# Patient Record
Sex: Male | Born: 1935 | Race: White | Hispanic: No | Smoking: Never smoker
Health system: Southern US, Community
[De-identification: ages and names within clinical notes are randomized; demographics above are authoritative.]

## PROBLEM LIST (undated history)

## (undated) DIAGNOSIS — H3530 Unspecified macular degeneration: Secondary | ICD-10-CM

## (undated) DIAGNOSIS — K649 Unspecified hemorrhoids: Secondary | ICD-10-CM

## (undated) DIAGNOSIS — G4733 Obstructive sleep apnea (adult) (pediatric): Secondary | ICD-10-CM

## (undated) DIAGNOSIS — R42 Dizziness and giddiness: Secondary | ICD-10-CM

## (undated) DIAGNOSIS — E785 Hyperlipidemia, unspecified: Secondary | ICD-10-CM

## (undated) DIAGNOSIS — K5790 Diverticulosis of intestine, part unspecified, without perforation or abscess without bleeding: Secondary | ICD-10-CM

## (undated) DIAGNOSIS — F1010 Alcohol abuse, uncomplicated: Secondary | ICD-10-CM

## (undated) DIAGNOSIS — D126 Benign neoplasm of colon, unspecified: Secondary | ICD-10-CM

## (undated) DIAGNOSIS — D649 Anemia, unspecified: Secondary | ICD-10-CM

## (undated) DIAGNOSIS — I10 Essential (primary) hypertension: Secondary | ICD-10-CM

## (undated) DIAGNOSIS — E119 Type 2 diabetes mellitus without complications: Secondary | ICD-10-CM

## (undated) DIAGNOSIS — Z9989 Dependence on other enabling machines and devices: Secondary | ICD-10-CM

## (undated) HISTORY — DX: Dizziness and giddiness: R42

## (undated) HISTORY — DX: Hyperlipidemia, unspecified: E78.5

## (undated) HISTORY — DX: Benign neoplasm of colon, unspecified: D12.6

## (undated) HISTORY — DX: Essential (primary) hypertension: I10

## (undated) HISTORY — DX: Diverticulosis of intestine, part unspecified, without perforation or abscess without bleeding: K57.90

## (undated) HISTORY — DX: Unspecified hemorrhoids: K64.9

## (undated) HISTORY — PX: ANTERIOR CERVICAL DECOMP/DISCECTOMY FUSION: SHX1161

## (undated) HISTORY — PX: CATARACT EXTRACTION: SUR2

## (undated) HISTORY — DX: Alcohol abuse, uncomplicated: F10.10

## (undated) HISTORY — PX: BACK SURGERY: SHX140

## (undated) HISTORY — DX: Unspecified macular degeneration: H35.30

## (undated) HISTORY — PX: TONSILLECTOMY: SUR1361

---
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
## 2001-04-14 ENCOUNTER — Encounter: Payer: Self-pay | Admitting: Emergency Medicine

## 2001-04-15 ENCOUNTER — Inpatient Hospital Stay (HOSPITAL_COMMUNITY): Admission: EM | Admit: 2001-04-15 | Discharge: 2001-04-17 | Payer: Self-pay | Admitting: Emergency Medicine

## 2001-04-17 ENCOUNTER — Encounter: Payer: Self-pay | Admitting: Internal Medicine

## 2003-03-10 DIAGNOSIS — D126 Benign neoplasm of colon, unspecified: Secondary | ICD-10-CM

## 2003-03-10 HISTORY — DX: Benign neoplasm of colon, unspecified: D12.6

## 2004-07-16 ENCOUNTER — Ambulatory Visit: Payer: Self-pay | Admitting: Internal Medicine

## 2004-08-12 ENCOUNTER — Ambulatory Visit: Payer: Self-pay | Admitting: Internal Medicine

## 2004-09-16 ENCOUNTER — Ambulatory Visit: Payer: Self-pay | Admitting: Internal Medicine

## 2004-10-16 ENCOUNTER — Ambulatory Visit: Payer: Self-pay | Admitting: Internal Medicine

## 2004-11-11 ENCOUNTER — Ambulatory Visit: Payer: Self-pay | Admitting: Internal Medicine

## 2004-12-18 ENCOUNTER — Ambulatory Visit: Payer: Self-pay | Admitting: Internal Medicine

## 2004-12-25 ENCOUNTER — Ambulatory Visit (HOSPITAL_COMMUNITY): Admission: RE | Admit: 2004-12-25 | Discharge: 2004-12-25 | Payer: Self-pay | Admitting: Ophthalmology

## 2005-01-14 ENCOUNTER — Ambulatory Visit: Payer: Self-pay | Admitting: Internal Medicine

## 2005-02-15 ENCOUNTER — Ambulatory Visit: Payer: Self-pay | Admitting: Internal Medicine

## 2005-03-11 ENCOUNTER — Ambulatory Visit: Payer: Self-pay | Admitting: Internal Medicine

## 2005-04-09 ENCOUNTER — Ambulatory Visit: Payer: Self-pay | Admitting: Internal Medicine

## 2005-05-11 ENCOUNTER — Ambulatory Visit: Payer: Self-pay | Admitting: Internal Medicine

## 2005-05-17 ENCOUNTER — Encounter: Admission: RE | Admit: 2005-05-17 | Discharge: 2005-05-17 | Payer: Self-pay | Admitting: Internal Medicine

## 2005-06-11 ENCOUNTER — Encounter: Admission: RE | Admit: 2005-06-11 | Discharge: 2005-06-11 | Payer: Self-pay | Admitting: Neurology

## 2005-06-15 ENCOUNTER — Ambulatory Visit: Payer: Self-pay | Admitting: Internal Medicine

## 2005-07-13 ENCOUNTER — Ambulatory Visit: Payer: Self-pay | Admitting: Internal Medicine

## 2005-07-23 ENCOUNTER — Encounter: Admission: RE | Admit: 2005-07-23 | Discharge: 2005-07-23 | Payer: Self-pay | Admitting: Internal Medicine

## 2005-08-17 ENCOUNTER — Ambulatory Visit: Payer: Self-pay | Admitting: Internal Medicine

## 2005-10-15 ENCOUNTER — Ambulatory Visit: Payer: Self-pay | Admitting: Internal Medicine

## 2005-11-15 ENCOUNTER — Ambulatory Visit: Payer: Self-pay | Admitting: Internal Medicine

## 2006-01-10 ENCOUNTER — Ambulatory Visit: Payer: Self-pay | Admitting: Internal Medicine

## 2006-01-17 ENCOUNTER — Ambulatory Visit: Payer: Self-pay | Admitting: Internal Medicine

## 2006-02-07 ENCOUNTER — Ambulatory Visit: Payer: Self-pay | Admitting: Internal Medicine

## 2006-03-01 ENCOUNTER — Ambulatory Visit: Payer: Self-pay | Admitting: Internal Medicine

## 2006-03-29 ENCOUNTER — Ambulatory Visit: Payer: Self-pay | Admitting: Internal Medicine

## 2006-04-27 ENCOUNTER — Ambulatory Visit: Payer: Self-pay | Admitting: Internal Medicine

## 2006-05-09 ENCOUNTER — Ambulatory Visit: Payer: Self-pay | Admitting: Gastroenterology

## 2006-05-30 ENCOUNTER — Encounter (INDEPENDENT_AMBULATORY_CARE_PROVIDER_SITE_OTHER): Payer: Self-pay | Admitting: *Deleted

## 2006-05-30 ENCOUNTER — Encounter: Payer: Self-pay | Admitting: Internal Medicine

## 2006-05-30 ENCOUNTER — Ambulatory Visit: Payer: Self-pay | Admitting: Gastroenterology

## 2006-06-01 ENCOUNTER — Ambulatory Visit: Payer: Self-pay | Admitting: Internal Medicine

## 2006-06-01 LAB — CONVERTED CEMR LAB: Hgb A1c MFr Bld: 6.6 % — ABNORMAL HIGH (ref 4.6–6.0)

## 2006-07-06 ENCOUNTER — Ambulatory Visit: Payer: Self-pay | Admitting: Internal Medicine

## 2006-09-07 ENCOUNTER — Ambulatory Visit: Payer: Self-pay | Admitting: Internal Medicine

## 2006-10-03 ENCOUNTER — Ambulatory Visit: Payer: Self-pay | Admitting: Internal Medicine

## 2006-10-03 LAB — CONVERTED CEMR LAB
Hgb A1c MFr Bld: 7.3 % — ABNORMAL HIGH (ref 4.6–6.0)
PSA: 0.07 ng/mL — ABNORMAL LOW (ref 0.10–4.00)

## 2006-11-01 ENCOUNTER — Ambulatory Visit: Payer: Self-pay | Admitting: Internal Medicine

## 2006-12-06 ENCOUNTER — Ambulatory Visit: Payer: Self-pay | Admitting: Internal Medicine

## 2006-12-19 ENCOUNTER — Ambulatory Visit: Payer: Self-pay | Admitting: Internal Medicine

## 2006-12-19 LAB — CONVERTED CEMR LAB
Folate: >20 ng/mL
Vitamin B-12: 684 pg/mL (ref 211–911)

## 2007-01-06 ENCOUNTER — Ambulatory Visit: Payer: Self-pay | Admitting: Internal Medicine

## 2007-02-01 DIAGNOSIS — I10 Essential (primary) hypertension: Secondary | ICD-10-CM | POA: Insufficient documentation

## 2007-02-24 ENCOUNTER — Ambulatory Visit: Payer: Self-pay | Admitting: Internal Medicine

## 2007-03-22 DIAGNOSIS — R42 Dizziness and giddiness: Secondary | ICD-10-CM | POA: Insufficient documentation

## 2007-03-23 ENCOUNTER — Ambulatory Visit: Payer: Self-pay | Admitting: Internal Medicine

## 2007-03-23 DIAGNOSIS — M48061 Spinal stenosis, lumbar region without neurogenic claudication: Secondary | ICD-10-CM | POA: Insufficient documentation

## 2007-03-23 DIAGNOSIS — M5000 Cervical disc disorder with myelopathy, unspecified cervical region: Secondary | ICD-10-CM | POA: Insufficient documentation

## 2007-03-23 DIAGNOSIS — E118 Type 2 diabetes mellitus with unspecified complications: Secondary | ICD-10-CM | POA: Insufficient documentation

## 2007-03-23 DIAGNOSIS — E538 Deficiency of other specified B group vitamins: Secondary | ICD-10-CM | POA: Insufficient documentation

## 2007-03-23 DIAGNOSIS — K5900 Constipation, unspecified: Secondary | ICD-10-CM | POA: Insufficient documentation

## 2007-03-31 ENCOUNTER — Telehealth: Payer: Self-pay | Admitting: *Deleted

## 2007-04-24 ENCOUNTER — Ambulatory Visit: Payer: Self-pay | Admitting: Internal Medicine

## 2007-04-24 DIAGNOSIS — I10 Essential (primary) hypertension: Secondary | ICD-10-CM | POA: Insufficient documentation

## 2007-05-26 ENCOUNTER — Ambulatory Visit: Payer: Self-pay | Admitting: Internal Medicine

## 2007-06-26 ENCOUNTER — Ambulatory Visit: Payer: Self-pay | Admitting: Internal Medicine

## 2007-07-19 ENCOUNTER — Ambulatory Visit: Payer: Self-pay | Admitting: Internal Medicine

## 2007-07-19 LAB — CONVERTED CEMR LAB: Hgb A1c MFr Bld: 6.9 % — ABNORMAL HIGH (ref 4.6–6.0)

## 2007-07-26 ENCOUNTER — Ambulatory Visit: Payer: Self-pay | Admitting: Internal Medicine

## 2007-08-31 ENCOUNTER — Ambulatory Visit: Payer: Self-pay | Admitting: Internal Medicine

## 2007-08-31 DIAGNOSIS — K140 Glossitis: Secondary | ICD-10-CM | POA: Insufficient documentation

## 2007-08-31 DIAGNOSIS — N400 Enlarged prostate without lower urinary tract symptoms: Secondary | ICD-10-CM | POA: Insufficient documentation

## 2007-10-02 ENCOUNTER — Ambulatory Visit: Payer: Self-pay | Admitting: Internal Medicine

## 2007-10-02 DIAGNOSIS — R609 Edema, unspecified: Secondary | ICD-10-CM | POA: Insufficient documentation

## 2007-10-02 DIAGNOSIS — R350 Frequency of micturition: Secondary | ICD-10-CM | POA: Insufficient documentation

## 2007-11-02 ENCOUNTER — Ambulatory Visit: Payer: Self-pay | Admitting: Internal Medicine

## 2007-12-07 ENCOUNTER — Ambulatory Visit: Payer: Self-pay | Admitting: Internal Medicine

## 2007-12-07 LAB — CONVERTED CEMR LAB: Hgb A1c MFr Bld: 6.8 % — ABNORMAL HIGH (ref 4.6–6.0)

## 2007-12-18 ENCOUNTER — Encounter: Admission: RE | Admit: 2007-12-18 | Discharge: 2008-03-17 | Payer: Self-pay | Admitting: Internal Medicine

## 2008-01-11 ENCOUNTER — Ambulatory Visit: Payer: Self-pay | Admitting: Internal Medicine

## 2008-02-23 ENCOUNTER — Ambulatory Visit: Payer: Self-pay | Admitting: Internal Medicine

## 2008-03-18 ENCOUNTER — Emergency Department (HOSPITAL_COMMUNITY): Admission: EM | Admit: 2008-03-18 | Discharge: 2008-03-18 | Payer: Self-pay | Admitting: Emergency Medicine

## 2008-03-25 ENCOUNTER — Ambulatory Visit: Payer: Self-pay | Admitting: Internal Medicine

## 2008-03-25 LAB — CONVERTED CEMR LAB
ALT: 23 units/L (ref 0–53)
AST: 17 units/L (ref 0–37)
Albumin: 3.4 g/dL — ABNORMAL LOW (ref 3.5–5.2)
Alkaline Phosphatase: 44 units/L (ref 39–117)
BUN: 12 mg/dL (ref 6–23)
Basophils Absolute: 0 10*3/uL (ref 0.0–0.1)
Basophils Relative: 0.5 % (ref 0.0–3.0)
Bilirubin, Direct: 0.1 mg/dL (ref 0.0–0.3)
CO2: 33 meq/L — ABNORMAL HIGH (ref 19–32)
Calcium: 9.3 mg/dL (ref 8.4–10.5)
Chloride: 107 meq/L (ref 96–112)
Cholesterol: 158 mg/dL (ref 0–200)
Creatinine, Ser: 0.9 mg/dL (ref 0.4–1.5)
Creatinine,U: 92.5 mg/dL
Eosinophils Absolute: 0.2 10*3/uL (ref 0.0–0.7)
Eosinophils Relative: 2.7 % (ref 0.0–5.0)
GFR calc Af Amer: 104 mL/min
GFR calc non Af Amer: 86 mL/min
Glucose, Bld: 167 mg/dL — ABNORMAL HIGH (ref 70–99)
HCT: 36.9 % — ABNORMAL LOW (ref 39.0–52.0)
HDL: 34 mg/dL — ABNORMAL LOW (ref >39.0)
Hemoglobin: 12.4 g/dL — ABNORMAL LOW (ref 13.0–17.0)
Hgb A1c MFr Bld: 6.6 % — ABNORMAL HIGH (ref 4.6–6.0)
LDL Cholesterol: 92 mg/dL (ref 0–99)
Lymphocytes Relative: 32.4 % (ref 12.0–46.0)
MCHC: 33.4 g/dL (ref 30.0–36.0)
MCV: 97.4 fL (ref 78.0–100.0)
Microalb Creat Ratio: 25.9 mg/g (ref 0.0–30.0)
Microalb, Ur: 2.4 mg/dL — ABNORMAL HIGH (ref 0.0–1.9)
Monocytes Absolute: 1.4 10*3/uL — ABNORMAL HIGH (ref 0.1–1.0)
Monocytes Relative: 21.6 % — ABNORMAL HIGH (ref 3.0–12.0)
Neutro Abs: 2.7 10*3/uL (ref 1.4–7.7)
Neutrophils Relative %: 42.8 % — ABNORMAL LOW (ref 43.0–77.0)
PSA: 0.08 ng/mL — ABNORMAL LOW (ref 0.10–4.00)
Platelets: 187 10*3/uL (ref 150–400)
Potassium: 5.2 meq/L — ABNORMAL HIGH (ref 3.5–5.1)
RBC: 3.79 M/uL — ABNORMAL LOW (ref 4.22–5.81)
RDW: 13.2 % (ref 11.5–14.6)
Sodium: 141 meq/L (ref 135–145)
TSH: 3.2 microintl units/mL (ref 0.35–5.50)
Total Bilirubin: 0.9 mg/dL (ref 0.3–1.2)
Total CHOL/HDL Ratio: 4.6
Total Protein: 6.6 g/dL (ref 6.0–8.3)
Triglycerides: 161 mg/dL — ABNORMAL HIGH (ref 0–149)
VLDL: 32 mg/dL (ref 0–40)
WBC: 6.3 10*3/uL (ref 4.5–10.5)

## 2008-04-01 ENCOUNTER — Ambulatory Visit: Payer: Self-pay | Admitting: Internal Medicine

## 2008-04-01 DIAGNOSIS — R635 Abnormal weight gain: Secondary | ICD-10-CM | POA: Insufficient documentation

## 2008-04-09 ENCOUNTER — Encounter: Admission: RE | Admit: 2008-04-09 | Discharge: 2008-05-02 | Payer: Self-pay | Admitting: Internal Medicine

## 2008-05-23 ENCOUNTER — Ambulatory Visit: Payer: Self-pay | Admitting: Internal Medicine

## 2008-06-24 ENCOUNTER — Ambulatory Visit: Payer: Self-pay | Admitting: Internal Medicine

## 2008-07-09 ENCOUNTER — Ambulatory Visit: Payer: Self-pay | Admitting: Internal Medicine

## 2008-07-09 LAB — CONVERTED CEMR LAB
ALT: 17 units/L (ref 0–53)
AST: 18 units/L (ref 0–37)
Albumin: 3.4 g/dL — ABNORMAL LOW (ref 3.5–5.2)
Alkaline Phosphatase: 39 units/L (ref 39–117)
BUN: 11 mg/dL (ref 6–23)
Bilirubin, Direct: 0.1 mg/dL (ref 0.0–0.3)
CO2: 31 meq/L (ref 19–32)
Calcium: 9.2 mg/dL (ref 8.4–10.5)
Chloride: 101 meq/L (ref 96–112)
Cholesterol: 159 mg/dL (ref 0–200)
Creatinine, Ser: 0.7 mg/dL (ref 0.4–1.5)
GFR calc Af Amer: 139 mL/min
GFR calc non Af Amer: 115 mL/min
Glucose, Bld: 153 mg/dL — ABNORMAL HIGH (ref 70–99)
HDL: 35.6 mg/dL — ABNORMAL LOW (ref >39.0)
LDL Cholesterol: 91 mg/dL (ref 0–99)
Potassium: 4.8 meq/L (ref 3.5–5.1)
Sodium: 138 meq/L (ref 135–145)
Total Bilirubin: 1 mg/dL (ref 0.3–1.2)
Total CHOL/HDL Ratio: 4.5
Total Protein: 6.6 g/dL (ref 6.0–8.3)
Triglycerides: 160 mg/dL — ABNORMAL HIGH (ref 0–149)
VLDL: 32 mg/dL (ref 0–40)

## 2008-07-23 ENCOUNTER — Ambulatory Visit: Payer: Self-pay | Admitting: Internal Medicine

## 2008-08-23 ENCOUNTER — Ambulatory Visit: Payer: Self-pay | Admitting: Internal Medicine

## 2008-09-23 ENCOUNTER — Ambulatory Visit: Payer: Self-pay | Admitting: Internal Medicine

## 2008-10-15 ENCOUNTER — Ambulatory Visit: Payer: Self-pay | Admitting: Internal Medicine

## 2008-10-15 LAB — CONVERTED CEMR LAB
ALT: 22 units/L (ref 0–53)
AST: 16 units/L (ref 0–37)
Albumin: 3.5 g/dL (ref 3.5–5.2)
Alkaline Phosphatase: 42 units/L (ref 39–117)
BUN: 13 mg/dL (ref 6–23)
Bilirubin, Direct: 0.1 mg/dL (ref 0.0–0.3)
CO2: 30 meq/L (ref 19–32)
Calcium: 9.5 mg/dL (ref 8.4–10.5)
Chloride: 99 meq/L (ref 96–112)
Creatinine, Ser: 0.8 mg/dL (ref 0.4–1.5)
GFR calc Af Amer: 119 mL/min
GFR calc non Af Amer: 99 mL/min
Glucose, Bld: 131 mg/dL — ABNORMAL HIGH (ref 70–99)
Hgb A1c MFr Bld: 7.2 % — ABNORMAL HIGH (ref 4.6–6.0)
Potassium: 4.4 meq/L (ref 3.5–5.1)
Sodium: 138 meq/L (ref 135–145)
Total Bilirubin: 1 mg/dL (ref 0.3–1.2)
Total Protein: 6.7 g/dL (ref 6.0–8.3)

## 2008-10-22 ENCOUNTER — Ambulatory Visit: Payer: Self-pay | Admitting: Internal Medicine

## 2009-01-14 ENCOUNTER — Ambulatory Visit: Payer: Self-pay | Admitting: Internal Medicine

## 2009-01-14 LAB — CONVERTED CEMR LAB
ALT: 20 units/L (ref 0–53)
AST: 17 units/L (ref 0–37)
Albumin: 3.4 g/dL — ABNORMAL LOW (ref 3.5–5.2)
Alkaline Phosphatase: 39 units/L (ref 39–117)
Bilirubin, Direct: 0.1 mg/dL (ref 0.0–0.3)
Creatinine,U: 39 mg/dL
Hgb A1c MFr Bld: 7.1 % — ABNORMAL HIGH (ref 4.6–6.5)
Microalb Creat Ratio: 10.3 mg/g (ref 0.0–30.0)
Microalb, Ur: 0.4 mg/dL (ref 0.0–1.9)
Total Bilirubin: 0.8 mg/dL (ref 0.3–1.2)
Total Protein: 6.6 g/dL (ref 6.0–8.3)

## 2009-01-21 ENCOUNTER — Ambulatory Visit: Payer: Self-pay | Admitting: Internal Medicine

## 2009-01-21 DIAGNOSIS — L42 Pityriasis rosea: Secondary | ICD-10-CM | POA: Insufficient documentation

## 2009-04-16 ENCOUNTER — Ambulatory Visit: Payer: Self-pay | Admitting: Internal Medicine

## 2009-04-16 LAB — CONVERTED CEMR LAB
ALT: 21 units/L (ref 0–53)
AST: 18 units/L (ref 0–37)
Albumin: 3.6 g/dL (ref 3.5–5.2)
Alkaline Phosphatase: 43 units/L (ref 39–117)
BUN: 15 mg/dL (ref 6–23)
Bilirubin, Direct: 0 mg/dL (ref 0.0–0.3)
CO2: 30 meq/L (ref 19–32)
Calcium: 9.1 mg/dL (ref 8.4–10.5)
Chloride: 104 meq/L (ref 96–112)
Cholesterol: 136 mg/dL (ref 0–200)
Creatinine, Ser: 0.8 mg/dL (ref 0.4–1.5)
GFR calc non Af Amer: 98.41 mL/min (ref >60)
Glucose, Bld: 162 mg/dL — ABNORMAL HIGH (ref 70–99)
HDL: 30.2 mg/dL — ABNORMAL LOW (ref >39.00)
Hgb A1c MFr Bld: 6.9 % — ABNORMAL HIGH (ref 4.6–6.5)
LDL Cholesterol: 79 mg/dL (ref 0–99)
Potassium: 4.8 meq/L (ref 3.5–5.1)
Sodium: 141 meq/L (ref 135–145)
Total Bilirubin: 1.2 mg/dL (ref 0.3–1.2)
Total CHOL/HDL Ratio: 5
Total Protein: 6.9 g/dL (ref 6.0–8.3)
Triglycerides: 135 mg/dL (ref 0.0–149.0)
VLDL: 27 mg/dL (ref 0.0–40.0)

## 2009-04-23 ENCOUNTER — Ambulatory Visit: Payer: Self-pay | Admitting: Internal Medicine

## 2009-05-26 ENCOUNTER — Ambulatory Visit: Payer: Self-pay | Admitting: Internal Medicine

## 2009-07-23 ENCOUNTER — Ambulatory Visit: Payer: Self-pay | Admitting: Internal Medicine

## 2009-07-23 DIAGNOSIS — N529 Male erectile dysfunction, unspecified: Secondary | ICD-10-CM | POA: Insufficient documentation

## 2009-07-23 LAB — CONVERTED CEMR LAB
ALT: 21 units/L (ref 0–53)
AST: 16 units/L (ref 0–37)
Albumin: 3.6 g/dL (ref 3.5–5.2)
Alkaline Phosphatase: 42 units/L (ref 39–117)
BUN: 12 mg/dL (ref 6–23)
Bilirubin, Direct: 0 mg/dL (ref 0.0–0.3)
CO2: 31 meq/L (ref 19–32)
Calcium: 9.4 mg/dL (ref 8.4–10.5)
Chloride: 102 meq/L (ref 96–112)
Cholesterol: 163 mg/dL (ref 0–200)
Creatinine, Ser: 0.7 mg/dL (ref 0.4–1.5)
Direct LDL: 95.2 mg/dL
Folate: >20 ng/mL
GFR calc non Af Amer: 114.72 mL/min (ref >60)
Glucose, Bld: 122 mg/dL — ABNORMAL HIGH (ref 70–99)
HDL: 31.5 mg/dL — ABNORMAL LOW (ref >39.00)
Hgb A1c MFr Bld: 7.2 % — ABNORMAL HIGH (ref 4.6–6.5)
Potassium: 4.7 meq/L (ref 3.5–5.1)
Sodium: 140 meq/L (ref 135–145)
Total Bilirubin: 0.9 mg/dL (ref 0.3–1.2)
Total Protein: 7.3 g/dL (ref 6.0–8.3)
Vitamin B-12: >1500 pg/mL — ABNORMAL HIGH (ref 211–911)

## 2009-09-25 ENCOUNTER — Ambulatory Visit: Payer: Self-pay | Admitting: Internal Medicine

## 2009-10-21 ENCOUNTER — Ambulatory Visit: Payer: Self-pay | Admitting: Internal Medicine

## 2009-10-21 DIAGNOSIS — Z9181 History of falling: Secondary | ICD-10-CM | POA: Insufficient documentation

## 2009-10-28 ENCOUNTER — Encounter: Payer: Self-pay | Admitting: Internal Medicine

## 2009-11-20 ENCOUNTER — Ambulatory Visit: Payer: Self-pay | Admitting: Internal Medicine

## 2009-12-24 ENCOUNTER — Ambulatory Visit: Payer: Self-pay | Admitting: Internal Medicine

## 2010-01-06 ENCOUNTER — Telehealth: Payer: Self-pay | Admitting: Internal Medicine

## 2010-01-20 ENCOUNTER — Ambulatory Visit: Payer: Self-pay | Admitting: Internal Medicine

## 2010-01-20 DIAGNOSIS — K120 Recurrent oral aphthae: Secondary | ICD-10-CM | POA: Insufficient documentation

## 2010-01-20 LAB — CONVERTED CEMR LAB
BUN: 19 mg/dL (ref 6–23)
CO2: 33 meq/L — ABNORMAL HIGH (ref 19–32)
Calcium: 9.5 mg/dL (ref 8.4–10.5)
Chloride: 100 meq/L (ref 96–112)
Creatinine, Ser: 0.8 mg/dL (ref 0.4–1.5)
GFR calc non Af Amer: 98.22 mL/min (ref >60)
Glucose, Bld: 130 mg/dL — ABNORMAL HIGH (ref 70–99)
Hgb A1c MFr Bld: 7.2 % — ABNORMAL HIGH (ref 4.6–6.5)
Potassium: 5.1 meq/L (ref 3.5–5.1)
Sodium: 141 meq/L (ref 135–145)

## 2010-02-27 ENCOUNTER — Ambulatory Visit: Payer: Self-pay | Admitting: Cardiovascular Disease

## 2010-02-27 ENCOUNTER — Ambulatory Visit: Payer: Self-pay | Admitting: Internal Medicine

## 2010-02-27 ENCOUNTER — Inpatient Hospital Stay (HOSPITAL_COMMUNITY): Admission: EM | Admit: 2010-02-27 | Discharge: 2010-03-03 | Payer: Self-pay | Admitting: Cardiovascular Disease

## 2010-02-27 DIAGNOSIS — I4892 Unspecified atrial flutter: Secondary | ICD-10-CM | POA: Insufficient documentation

## 2010-02-28 ENCOUNTER — Encounter: Payer: Self-pay | Admitting: Cardiovascular Disease

## 2010-02-28 ENCOUNTER — Encounter: Payer: Self-pay | Admitting: Internal Medicine

## 2010-03-02 ENCOUNTER — Encounter: Payer: Self-pay | Admitting: Internal Medicine

## 2010-03-30 ENCOUNTER — Telehealth (INDEPENDENT_AMBULATORY_CARE_PROVIDER_SITE_OTHER): Payer: Self-pay | Admitting: *Deleted

## 2010-03-31 ENCOUNTER — Ambulatory Visit: Payer: Self-pay | Admitting: Cardiovascular Disease

## 2010-03-31 ENCOUNTER — Encounter (HOSPITAL_COMMUNITY): Admission: RE | Admit: 2010-03-31 | Discharge: 2010-05-06 | Payer: Self-pay | Admitting: Internal Medicine

## 2010-03-31 ENCOUNTER — Ambulatory Visit: Payer: Self-pay | Admitting: Internal Medicine

## 2010-03-31 ENCOUNTER — Encounter: Payer: Self-pay | Admitting: Cardiovascular Disease

## 2010-03-31 ENCOUNTER — Ambulatory Visit: Payer: Self-pay

## 2010-04-03 ENCOUNTER — Telehealth: Payer: Self-pay | Admitting: *Deleted

## 2010-04-08 ENCOUNTER — Ambulatory Visit: Payer: Self-pay | Admitting: Internal Medicine

## 2010-05-06 ENCOUNTER — Telehealth: Payer: Self-pay | Admitting: Internal Medicine

## 2010-06-10 ENCOUNTER — Ambulatory Visit: Payer: Self-pay | Admitting: Internal Medicine

## 2010-06-10 LAB — CONVERTED CEMR LAB
ALT: 15 units/L (ref 0–53)
AST: 14 units/L (ref 0–37)
Albumin: 3.5 g/dL (ref 3.5–5.2)
Alkaline Phosphatase: 45 units/L (ref 39–117)
Bilirubin, Direct: 0.1 mg/dL (ref 0.0–0.3)
Cholesterol: 148 mg/dL (ref 0–200)
HDL: 35.2 mg/dL — ABNORMAL LOW (ref >39.00)
Hgb A1c MFr Bld: 7.2 % — ABNORMAL HIGH (ref 4.6–6.5)
LDL Cholesterol: 77 mg/dL (ref 0–99)
Total Bilirubin: 0.8 mg/dL (ref 0.3–1.2)
Total CHOL/HDL Ratio: 4
Total Protein: 6.5 g/dL (ref 6.0–8.3)
Triglycerides: 181 mg/dL — ABNORMAL HIGH (ref 0.0–149.0)
VLDL: 36.2 mg/dL (ref 0.0–40.0)

## 2010-06-17 ENCOUNTER — Ambulatory Visit: Payer: Self-pay | Admitting: Internal Medicine

## 2010-08-29 ENCOUNTER — Encounter: Payer: Self-pay | Admitting: Internal Medicine

## 2010-09-06 LAB — CONVERTED CEMR LAB
ALT: 24 units/L (ref 0–53)
AST: 17 units/L (ref 0–37)
Albumin: 3.6 g/dL (ref 3.5–5.2)
Alkaline Phosphatase: 44 units/L (ref 39–117)
BUN: 13 mg/dL (ref 6–23)
Basophils Absolute: 0 10*3/uL (ref 0.0–0.1)
Basophils Relative: 0.7 % (ref 0.0–1.0)
Bilirubin, Direct: 0.1 mg/dL (ref 0.0–0.3)
CO2: 33 meq/L — ABNORMAL HIGH (ref 19–32)
Calcium: 9.1 mg/dL (ref 8.4–10.5)
Chloride: 101 meq/L (ref 96–112)
Cholesterol: 155 mg/dL (ref 0–200)
Creatinine, Ser: 0.8 mg/dL (ref 0.4–1.5)
Eosinophils Absolute: 0.2 10*3/uL (ref 0.0–0.6)
Eosinophils Relative: 2.7 % (ref 0.0–5.0)
Folate: >20 ng/mL
GFR calc Af Amer: 120 mL/min
GFR calc non Af Amer: 99 mL/min
Glucose, Bld: 150 mg/dL — ABNORMAL HIGH (ref 70–99)
HCT: 38.7 % — ABNORMAL LOW (ref 39.0–52.0)
HDL: 35.3 mg/dL — ABNORMAL LOW (ref >39.0)
Hemoglobin: 13.4 g/dL (ref 13.0–17.0)
Hgb A1c MFr Bld: 6.6 % — ABNORMAL HIGH (ref 4.6–6.0)
LDL Cholesterol: 90 mg/dL (ref 0–99)
Lymphocytes Relative: 37.9 % (ref 12.0–46.0)
MCHC: 34.5 g/dL (ref 30.0–36.0)
MCV: 95 fL (ref 78.0–100.0)
Monocytes Absolute: 1.1 10*3/uL — ABNORMAL HIGH (ref 0.2–0.7)
Monocytes Relative: 17.3 % — ABNORMAL HIGH (ref 3.0–11.0)
Neutro Abs: 2.5 10*3/uL (ref 1.4–7.7)
Neutrophils Relative %: 41.4 % — ABNORMAL LOW (ref 43.0–77.0)
PSA: 0.09 ng/mL — ABNORMAL LOW (ref 0.10–4.00)
Platelets: 193 10*3/uL (ref 150–400)
Potassium: 4.3 meq/L (ref 3.5–5.1)
RBC: 4.08 M/uL — ABNORMAL LOW (ref 4.22–5.81)
RDW: 13.5 % (ref 11.5–14.6)
Sodium: 139 meq/L (ref 135–145)
TSH: 3.24 microintl units/mL (ref 0.35–5.50)
Total Bilirubin: 0.8 mg/dL (ref 0.3–1.2)
Total CHOL/HDL Ratio: 4.4
Total Protein: 6.5 g/dL (ref 6.0–8.3)
Triglycerides: 150 mg/dL — ABNORMAL HIGH (ref 0–149)
VLDL: 30 mg/dL (ref 0–40)
Vitamin B-12: >1500 pg/mL — ABNORMAL HIGH (ref 211–911)
WBC: 6.1 10*3/uL (ref 4.5–10.5)

## 2010-09-10 NOTE — Assessment & Plan Note (Signed)
Summary: 2 mo rov/mm   Vital Signs:  Patient profile:   77 year old male Height:      68 inches Weight:      220 pounds BMI:     33.57 Temp:     98.2 degrees F oral Pulse rate:   129 / minute Resp:     14 per minute BP sitting:   116 / 74  (left arm)  Vitals Entered By: Willy Eddy, LPN (February 27, 2010 11:59 AM) CC: roa-never started folbic stating pharmacy didnt have   CC:  roa-never started folbic stating pharmacy didnt have.  History of Present Illness: The pt  presents fr routine OV for DM and HTN and was noted to have a rapid pulse He denies chest pain, palpitatons or shortness of breat he is pale in apearance and skin is cool and damp He denies dizzyness  biut of note is that he has presented in the past for periodic dizzyness and at OV has not had any noted fast or irregular heart rates he has DM  ( fair control) and HTN well controled Due to gate problems he has a fall risk  Preventive Screening-Counseling & Management  Alcohol-Tobacco     Smoking Status: never     Passive Smoke Exposure: no  Problems Prior to Update: 1)  Aphthous Ulcers  (ICD-528.2) 2)  Risk of Falling  (ICD-V15.88) 3)  Erectile Dysfunction, Organic  (ICD-607.84) 4)  Pityriasis Rosea  (ICD-696.3) 5)  Preventive Health Care  (ICD-V70.0) 6)  Weight Gain  (ICD-783.1) 7)  Cellulitis and Abscess of Upper Arm and Forearm  (ICD-682.3) 8)  Edema  (ICD-782.3) 9)  Urinary Frequency, Chronic  (ICD-788.41) 10)  Ben Loc Hyperplasia Pros w/o Ur Obst & Oth Luts  (ICD-600.20) 11)  Glossitis  (ICD-529.0) 12)  Hyperlipidemia  (ICD-272.4) 13)  Constipation, Slow Transit  (ICD-564.01) 14)  Vitamin B12 Deficiency  (ICD-266.2) 15)  Stenosis, Lumbar Spine  (ICD-724.02) 16)  Preventive Health Care  (ICD-V70.0) 17)  Disorder, Cervical Disc W/myelopathy  (ICD-722.71) 18)  Dizziness or Vertigo  (ICD-780.4) 19)  Dm W/neuro Manifestations, Type II  (ICD-250.60) 20)  Hypertension  (ICD-401.9)  Current  Problems (verified): 1)  Aphthous Ulcers  (ICD-528.2) 2)  Risk of Falling  (ICD-V15.88) 3)  Erectile Dysfunction, Organic  (ICD-607.84) 4)  Pityriasis Rosea  (ICD-696.3) 5)  Preventive Health Care  (ICD-V70.0) 6)  Weight Gain  (ICD-783.1) 7)  Cellulitis and Abscess of Upper Arm and Forearm  (ICD-682.3) 8)  Edema  (ICD-782.3) 9)  Urinary Frequency, Chronic  (ICD-788.41) 10)  Ben Loc Hyperplasia Pros w/o Ur Obst & Oth Luts  (ICD-600.20) 11)  Glossitis  (ICD-529.0) 12)  Hyperlipidemia  (ICD-272.4) 13)  Constipation, Slow Transit  (ICD-564.01) 14)  Vitamin B12 Deficiency  (ICD-266.2) 15)  Stenosis, Lumbar Spine  (ICD-724.02) 16)  Preventive Health Care  (ICD-V70.0) 17)  Disorder, Cervical Disc W/myelopathy  (ICD-722.71) 18)  Dizziness or Vertigo  (ICD-780.4) 19)  Dm W/neuro Manifestations, Type II  (ICD-250.60) 20)  Hypertension  (ICD-401.9)  Medications Prior to Update: 1)  Amitriptyline Hcl 75 Mg  Tabs (Amitriptyline Hcl) .... At Bedtime 2)  Micardis 80 Mg Tabs (Telmisartan) .... One By Mouth Daily 3)  Cosopt   Soln (Dorzolamide-Timolol Soln) .... Two Times A Day 4)  Glipizide Xl 5 Mg Xr24h-Tab (Glipizide) .Marland Kitchen.. 1 Once Daily 5)  Metformin Hcl 500 Mg  Tabs (Metformin Hcl) .... Two Times A Day 6)  Enablex 15 Mg  Tb24 (Darifenacin Hydrobromide) .Marland KitchenMarland KitchenMarland Kitchen  Take 1 Tablet By Mouth Once A Day 7)  Ra Chromium Picolinate 400 Mcg Tabs (Chromium Picolinate) .Marland Kitchen.. 1 Once Daily 8)  Bayer Aspirin 325 Mg  Tabs (Aspirin) .... 1/2 Once Daily 9)  K-99 595 Mg  Caps (Potassium Gluconate) .... Once Daily 10)  Multivitamins   Tabs (Multiple Vitamin) .... Once Daily 11)  Flonase 50 Mcg/act  Susp (Fluticasone Propionate) .... As Directed 12)  Ascensia Contour Test   Strp (Glucose Blood) .... Once Daily 13)  Omega-3 1000 Mg  Caps (Omega-3 Fatty Acids) .... 6 Per Day 14)  Magnebind 400 200-1-400 Mg  Tabs (Magnesium-Calcium-Folic Acid) .... Once Daily 15)  Calcium 600/vitamin D 600-400 Mg-Unit Tabs (Calcium  Carbonate-Vitamin D) .Marland Kitchen.. 1 Once Daily 16)  Eye-Vites   Tabs (Multiple Vitamins-Minerals) .Marland Kitchen.. 1 Two Times A Day 17)  Cyanocobalamin 1000 Mcg/ml Inj Soln (Cyanocobalamin) .Marland Kitchen.. 1 Ml Per Month 18)  Docusate Sodium 100 Mg  Caps (Docusate Sodium) .... One Three Times A Day 19)  Flomax 0.4 Mg Xr24h-Cap (Tamsulosin Hcl) .Marland Kitchen.. 1 Once Daily 20)  Vitamin C 100 Mg Tabs (Ascorbic Acid) .Marland Kitchen.. 1 Once Daily 21)  Vitamin E 400 Unit Caps (Vitamin E) .Marland Kitchen.. 1 Once Daily 22)  Melatonin 5 Mg Caps (Melatonin) .... 2 At Bedtime 23)  Folbic 2.5-25-2 Mg Tabs (Fa-Pyridoxine-Cyancobalamin) .... One By Mouth Daily 24)  First-Dukes Mouthwash  Susp (Diphenhyd-Hydrocort-Nystatin) .Marland Kitchen.. 10 Cc Gargle and Spit Three Times A Day  Current Medications (verified): 1)  Amitriptyline Hcl 75 Mg  Tabs (Amitriptyline Hcl) .... At Bedtime 2)  Micardis 80 Mg Tabs (Telmisartan) .... One By Mouth Daily 3)  Cosopt   Soln (Dorzolamide-Timolol Soln) .... Two Times A Day 4)  Glipizide Xl 5 Mg Xr24h-Tab (Glipizide) .Marland Kitchen.. 1 Once Daily 5)  Metformin Hcl 500 Mg  Tabs (Metformin Hcl) .... Two Times A Day 6)  Enablex 15 Mg  Tb24 (Darifenacin Hydrobromide) .... Take 1 Tablet By Mouth Once A Day 7)  Ra Chromium Picolinate 400 Mcg Tabs (Chromium Picolinate) .Marland Kitchen.. 1 Once Daily 8)  Bayer Aspirin 325 Mg  Tabs (Aspirin) .... 1/2 Once Daily 9)  K-99 595 Mg  Caps (Potassium Gluconate) .... Once Daily 10)  Multivitamins   Tabs (Multiple Vitamin) .... Once Daily 11)  Flonase 50 Mcg/act  Susp (Fluticasone Propionate) .... As Directed 12)  Ascensia Contour Test   Strp (Glucose Blood) .... Once Daily 13)  Omega-3 1000 Mg  Caps (Omega-3 Fatty Acids) .... 6 Per Day 14)  Magnebind 400 200-1-400 Mg  Tabs (Magnesium-Calcium-Folic Acid) .... Once Daily 15)  Calcium 600/vitamin D 600-400 Mg-Unit Tabs (Calcium Carbonate-Vitamin D) .Marland Kitchen.. 1 Once Daily 16)  Eye-Vites   Tabs (Multiple Vitamins-Minerals) .Marland Kitchen.. 1 Two Times A Day 17)  Cyanocobalamin 1000 Mcg/ml Inj Soln  (Cyanocobalamin) .Marland Kitchen.. 1 Ml Per Month 18)  Docusate Sodium 100 Mg  Caps (Docusate Sodium) .... One Three Times A Day 19)  Flomax 0.4 Mg Xr24h-Cap (Tamsulosin Hcl) .Marland Kitchen.. 1 Once Daily 20)  Vitamin C 100 Mg Tabs (Ascorbic Acid) .Marland Kitchen.. 1 Once Daily 21)  Vitamin E 400 Unit Caps (Vitamin E) .Marland Kitchen.. 1 Once Daily 22)  Melatonin 5 Mg Caps (Melatonin) .... 2 At Bedtime 23)  Folbic 2.5-25-2 Mg Tabs (Fa-Pyridoxine-Cyancobalamin) .... One By Mouth Daily  Allergies (verified): No Known Drug Allergies  Past History:  Family History: Last updated: March 25, 2007 father died from CVA at age 86 mother died at 3  Social History: Last updated: 02/01/2007 Retired Divorced Never Smoked Alcohol use-yes  Risk Factors: Exercise: no (  04/24/2007)  Risk Factors: Smoking Status: never (02/27/2010) Passive Smoke Exposure: no (02/27/2010)  Past medical, surgical, family and social histories (including risk factors) reviewed, and no changes noted (except as noted below).  Past Medical History: Reviewed history from 04/01/2008 and no changes required. Hypertension Diabetes Macular Degeneration-right eye Diabetes mellitus, type II Dizziness or vertigo spinal stenosis oin lumbar spine  severe by MRI 2006 Hyperlipidemia constipation binge drinking ETOH abouse  Past Surgical History: Reviewed history from 04/24/2007 and no changes required. Appendectomy Cataract sx-left eye Hernia repair Left thumb sx-malignant skin cancer Colonoscopy lense replacement left eye Cervical fusion Cervical laminectomy  Family History: Reviewed history from 03/23/2007 and no changes required. father died from CVA at age 70 mother died at 86  Social History: Reviewed history from 02/01/2007 and no changes required. Retired Divorced Never Smoked Alcohol use-yes  Review of Systems       The patient complains of weight gain, vision loss, decreased hearing, hoarseness, syncope, peripheral edema, muscle weakness, and  difficulty walking.    Physical Exam  General:  alert, well-hydrated, and overweight-appearing.   Head:  normocephalic and male-pattern balding.   Eyes:  pupils equal and pupils round.   Ears:  R ear normal and L ear normal.   Nose:  no external erythema.   Neck:  No deformities, masses, or tenderness noted. Lungs:  normal respiratory effort and no dullness.   Heart:  tachycardia.   Abdomen:  soft, no guarding, no rigidity, and distended.   Extremities:  1+ left pedal edema and 1+ right pedal edema.   Neurologic:  alert & oriented X3 and finger-to-nose normal.     Impression & Recommendations:  Problem # 1:  ATRIAL FLUTTER (ICD-427.32)  nw onset but has to consider if this was the etiology of prior reported dizzy spells admit due to overall risks for rule out, anticoagulation with pradaxa and considertation for ablation pt is cooperative and awre of this possibility Case was discussed with Dr Johney Frame His updated medication list for this problem includes:    Bayer Aspirin 325 Mg Tabs (Aspirin) .Marland Kitchen... 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 One by mouth daily    Glipizide Xl 5 Mg Xr24h-tab (Glipizide) .Marland Kitchen... 1 once daily    Metformin Hcl 500 Mg Tabs (Metformin hcl) .Marland Kitchen..Marland Kitchen Two times a day    Bayer Aspirin 325 Mg Tabs (Aspirin) .Marland Kitchen... 1/2 once daily  Labs Reviewed: Creat: 0.8 (01/20/2010)     Last Eye Exam: normal  (07/23/2009) Reviewed HgBA1c results: 7.2 (01/20/2010)  7.2 (07/23/2009)  Complete Medication List: 1)  Amitriptyline Hcl 75 Mg Tabs (Amitriptyline hcl) .... At bedtime 2)  Micardis 80 Mg Tabs (Telmisartan) .... One by mouth daily 3)  Cosopt Soln (Dorzolamide-timolol soln) .... Two times a day 4)  Glipizide Xl 5 Mg Xr24h-tab (Glipizide) .Marland Kitchen.. 1 once daily 5)  Metformin Hcl 500 Mg Tabs (Metformin hcl) .... Two times a day 6)  Enablex 15  Mg Tb24 (Darifenacin hydrobromide) .... Take 1 tablet by mouth once a day 7)  Ra Chromium Picolinate 400 Mcg Tabs (Chromium picolinate) .Marland Kitchen.. 1 once daily 8)  Bayer Aspirin 325 Mg Tabs (Aspirin) .... 1/2 once daily 9)  K-99 595 Mg Caps (Potassium gluconate) .... Once daily 10)  Multivitamins Tabs (Multiple vitamin) .... Once daily 11)  Flonase 50 Mcg/act Susp (Fluticasone propionate) .... As directed 12)  Ascensia Contour Test Strp (Glucose blood) .... Once daily 13)  Omega-3 1000 Mg Caps (Omega-3 fatty acids) .... 6 per day 14)  Magnebind 400 200-1-400 Mg Tabs (Magnesium-calcium-folic acid) .... Once daily 15)  Calcium 600/vitamin D 600-400 Mg-unit Tabs (Calcium carbonate-vitamin d) .Marland Kitchen.. 1 once daily 16)  Eye-vites Tabs (Multiple vitamins-minerals) .Marland Kitchen.. 1 two times a day 17)  Cyanocobalamin 1000 Mcg/ml Inj Soln (Cyanocobalamin) .Marland Kitchen.. 1 ml per month 18)  Docusate Sodium 100 Mg Caps (Docusate sodium) .... One three times a day 19)  Flomax 0.4 Mg Xr24h-cap (Tamsulosin hcl) .Marland Kitchen.. 1 once daily 20)  Vitamin C 100 Mg Tabs (Ascorbic acid) .Marland Kitchen.. 1 once daily 21)  Vitamin E 400 Unit Caps (Vitamin e) .Marland Kitchen.. 1 once daily 22)  Melatonin 5 Mg Caps (Melatonin) .... 2 at bedtime 23)  Folbic 2.5-25-2 Mg Tabs (Fa-pyridoxine-cyancobalamin) .... One by mouth daily  Other Orders: Vit B12 1000 mcg (J3420) Admin of Therapeutic Inj  intramuscular or subcutaneous (04540)  Patient Instructions: 1)  Go to admitting for admit to cardiology at [facility]   Medication  Administration  Injection # 1:    Medication: Vit B12 1000 mcg    Diagnosis: VITAMIN B12 DEFICIENCY (ICD-266.2)    Route: IM    Site: L deltoid    Exp Date: 06/09/2011    Lot #: [id number]    Mfr: American Regent    Patient tolerated injection without complications    Given by: Willy Eddy, LPN (February 27, 2010 12:02 PM)  Orders Added: 1)  EKG w/ Interpretation [93000] 2)  Vit B12 1000 mcg [J3420] 3)  Admin of Therapeutic Inj  intramuscular or subcutaneous [96372] 4)  Est. Patient Level V [91478]

## 2010-09-10 NOTE — Progress Notes (Signed)
----   Converted from flag ---- ---- 04/03/2010 11:09 AM, Stacie Glaze MD wrote:   ---- 03/31/2010 3:00 PM, Nathen May, MD, [facility] wrote: Nathan Cunningham a flutter is ablated and with htis is normalization of his LV fjunction.. Nuclear results are pending.  He did not tget to see you the day he came in for his visit when he was whisked off tothe hospitall  He would like very much to get back into see you asap thanks steveklein ------------------------------ appointment give for september 2 Phone Note Call from Patient

## 2010-09-10 NOTE — Progress Notes (Signed)
Summary: diag & med for mouth / tongue ulcers  Phone Note From Other Clinic   Caller: Di Kindle, Dr. Stacie Acres, dental office, [phone] anyone Summary of Call: For mouth ulcers on tongue sometime around Mar here.  Your diagnosis & med prescribed requested by Dr. Stacie Acres.  They recommended oral surgeon.   Initial call taken by: Rudy Jew, RN,  May 06, 2010 11:08 AM  Follow-up for Phone Call        see ov of 6-15 probelm nimber 1 Follow-up by: Willy Eddy, LPN,  May 06, 2010 11:19 AM  Additional Follow-up for Phone Call Additional follow up Details #1::        Aphthous stomatitis mouth ulcers.  Gave zovirax & duke's magic mouthwash.  Info give the [facility].   Additional Follow-up by: Rudy Jew, RN,  May 06, 2010 11:24 AM

## 2010-09-10 NOTE — Assessment & Plan Note (Signed)
Summary: B12 INJ // RS  Nurse Visit   Allergies: No Known Drug Allergies  Medication Administration  Injection # 1:    Medication: Vit B12 1000 mcg    Diagnosis: VITAMIN B12 DEFICIENCY (ICD-266.2)    Route: IM    Site: L deltoid    Exp Date: 05/08/2011    Lot #: [id number]    Mfr: American Regent    Patient tolerated injection without complications    Given by: Willy Eddy, LPN (Dec 24, 2009 12:18 PM)  Orders Added: 1)  Vit B12 1000 mcg [J3420] 2)  Admin of Therapeutic Inj  intramuscular or subcutaneous [23557]

## 2010-09-10 NOTE — Progress Notes (Signed)
Summary: Nuclear pre procedure  Phone Note Outgoing Call Call back at [facility] Phone [phone]   Call placed by: Rea College, CMA,  March 30, 2010 4:08 PM Call placed to: Patient Summary of Call: Reviewed information on [employer] Information Sheet (see scanned document for further details).  Spoke with patient's wife.      Nuclear Med Background Indications for Stress Test: Evaluation for Ischemia, Post Hospital  Indications Comments: 02/27/10 [facility] with a.flutter>ablation  History: Ablation, Echo, Myocardial Perfusion Study  History Comments: '02 BJY:NWGNFA; 02/28/10 Echo:normal EF     Nuclear Pre-Procedure Cardiac Risk Factors: Hypertension, Lipids, NIDDM Height (in): 68

## 2010-09-10 NOTE — Assessment & Plan Note (Signed)
Summary: roa/bmw   Vital Signs:  Patient profile:   77 year old male Height:      68 inches Weight:      216 pounds BMI:     32.96 Temp:     98.2 degrees F oral Pulse rate:   76 / minute Pulse rhythm:   regular Resp:     14 per minute BP sitting:   130 / 80  (left arm)  Vitals Entered By: Willy Eddy, LPN (April 08, 2010 1:43 PM)  Nutrition Counseling: Patient's BMI is greater than 25 and therefore counseled on weight management options. CC: roa-taken off of pradaxa after 30 days, Hypertension Management Is Patient Diabetic? Yes Did you bring your meter with you today? No   CC:  roa-taken off of pradaxa after 30 days and Hypertension Management.  History of Present Illness: the pt has been through catheter ablation wth excelent result and has retuned to a normal EF the pt has  noted increased CBG's 150 to 250 we talked about the prior A1C  was 7.7 which  matches the reported cbg.s blood pressure has been stable he continues to stay off ETOH he has not experieinced chest pain or pressure has minimal swelling in legs beyond base line  Hypertension History:      He denies headache, chest pain, palpitations, dyspnea with exertion, orthopnea, PND, peripheral edema, visual symptoms, neurologic problems, syncope, and side effects from treatment.        Positive major cardiovascular risk factors include male age 89 years old or older, diabetes, hyperlipidemia, and hypertension.  Negative major cardiovascular risk factors include negative family history for ischemic heart disease and non-tobacco-user status.     Preventive Screening-Counseling & Management  Alcohol-Tobacco     Smoking Status: never     Passive Smoke Exposure: no     Tobacco Counseling: not indicated; no tobacco use  Problems Prior to Update: 1)  Cardiomyopathy, Secondary-tachycardia Mediated  (ICD-425.9) 2)  Atrial Flutter  (ICD-427.32) 3)  Aphthous Ulcers  (ICD-528.2) 4)  Risk of Falling   (ICD-V15.88) 5)  Erectile Dysfunction, Organic  (ICD-607.84) 6)  Pityriasis Rosea  (ICD-696.3) 7)  Preventive Health Care  (ICD-V70.0) 8)  Weight Gain  (ICD-783.1) 9)  Cellulitis and Abscess of Upper Arm and Forearm  (ICD-682.3) 10)  Edema  (ICD-782.3) 11)  Urinary Frequency, Chronic  (ICD-788.41) 12)  Ben Loc Hyperplasia Pros w/o Ur Obst & Oth Luts  (ICD-600.20) 13)  Glossitis  (ICD-529.0) 14)  Hyperlipidemia  (ICD-272.4) 15)  Constipation, Slow Transit  (ICD-564.01) 16)  Vitamin B12 Deficiency  (ICD-266.2) 17)  Stenosis, Lumbar Spine  (ICD-724.02) 18)  Preventive Health Care  (ICD-V70.0) 19)  Disorder, Cervical Disc W/myelopathy  (ICD-722.71) 20)  Dizziness or Vertigo  (ICD-780.4) 21)  Dm W/neuro Manifestations, Type II  (ICD-250.60) 22)  Hypertension  (ICD-401.9)  Current Problems (verified): 1)  Cardiomyopathy, Secondary-tachycardia Mediated  (ICD-425.9) 2)  Atrial Flutter  (ICD-427.32) 3)  Aphthous Ulcers  (ICD-528.2) 4)  Risk of Falling  (ICD-V15.88) 5)  Erectile Dysfunction, Organic  (ICD-607.84) 6)  Pityriasis Rosea  (ICD-696.3) 7)  Preventive Health Care  (ICD-V70.0) 8)  Weight Gain  (ICD-783.1) 9)  Cellulitis and Abscess of Upper Arm and Forearm  (ICD-682.3) 10)  Edema  (ICD-782.3) 11)  Urinary Frequency, Chronic  (ICD-788.41) 12)  Ben Loc Hyperplasia Pros w/o Ur Obst & Oth Luts  (ICD-600.20) 13)  Glossitis  (ICD-529.0) 14)  Hyperlipidemia  (ICD-272.4) 15)  Constipation, Slow Transit  (ICD-564.01) 16)  Vitamin B12 Deficiency  (ICD-266.2) 17)  Stenosis, Lumbar Spine  (ICD-724.02) 18)  Preventive Health Care  (ICD-V70.0) 19)  Disorder, Cervical Disc W/myelopathy  (ICD-722.71) 20)  Dizziness or Vertigo  (ICD-780.4) 21)  Dm W/neuro Manifestations, Type II  (ICD-250.60) 22)  Hypertension  (ICD-401.9)  Medications Prior to Update: 1)  Amitriptyline Hcl 75 Mg  Tabs (Amitriptyline Hcl) .... At Bedtime 2)  Micardis 80 Mg Tabs (Telmisartan) .... One By Mouth Daily 3)   Cosopt   Soln (Dorzolamide-Timolol Soln) .... Two Times A Day 4)  Glipizide Xl 5 Mg Xr24h-Tab (Glipizide) .Marland Kitchen.. 1 Once Daily 5)  Metformin Hcl 500 Mg  Tabs (Metformin Hcl) .... Two Times A Day 6)  Enablex 15 Mg  Tb24 (Darifenacin Hydrobromide) .... Take 1 Tablet By Mouth Once A Day 7)  Ra Chromium Picolinate 400 Mcg Tabs (Chromium Picolinate) .Marland Kitchen.. 1 Once Daily 8)  Bayer Aspirin 325 Mg  Tabs (Aspirin) .... 1/2 Once Daily 9)  K-99 595 Mg  Caps (Potassium Gluconate) .... Once Daily 10)  Multivitamins   Tabs (Multiple Vitamin) .... Once Daily 11)  Flonase 50 Mcg/act  Susp (Fluticasone Propionate) .... As Directed 12)  Ascensia Contour Test   Strp (Glucose Blood) .... Once Daily 13)  Omega-3 1000 Mg  Caps (Omega-3 Fatty Acids) .... 6 Per Day 14)  Magnebind 400 200-1-400 Mg  Tabs (Magnesium-Calcium-Folic Acid) .... Once Daily 15)  Calcium 600/vitamin D 600-400 Mg-Unit Tabs (Calcium Carbonate-Vitamin D) .Marland Kitchen.. 1 Once Daily 16)  Eye-Vites   Tabs (Multiple Vitamins-Minerals) .Marland Kitchen.. 1 Two Times A Day 17)  Cyanocobalamin 1000 Mcg/ml Inj Soln (Cyanocobalamin) .Marland Kitchen.. 1 Ml Per Month 18)  Docusate Sodium 100 Mg  Caps (Docusate Sodium) .... One Three Times A Day 19)  Flomax 0.4 Mg Xr24h-Cap (Tamsulosin Hcl) .Marland Kitchen.. 1 Once Daily 20)  Vitamin C 100 Mg Tabs (Ascorbic Acid) .Marland Kitchen.. 1 Once Daily 21)  Vitamin E 400 Unit Caps (Vitamin E) .Marland Kitchen.. 1 Once Daily 22)  Melatonin 5 Mg Caps (Melatonin) .... 2 At Bedtime 23)  Folbic 2.5-25-2 Mg Tabs (Fa-Pyridoxine-Cyancobalamin) .... One By Mouth Daily  Current Medications (verified): 1)  Amitriptyline Hcl 75 Mg  Tabs (Amitriptyline Hcl) .... At Bedtime 2)  Micardis 80 Mg Tabs (Telmisartan) .... One By Mouth Daily 3)  Cosopt   Soln (Dorzolamide-Timolol Soln) .... Two Times A Day 4)  Glipizide Xl 5 Mg Xr24h-Tab (Glipizide) .Marland Kitchen.. 1 Once Daily 5)  Kombiglyze Xr 2.12-998 Mg Xr24h-Tab (Saxagliptin-Metformin) .... One By Mouth Daily 6)  Enablex 15 Mg  Tb24 (Darifenacin Hydrobromide) .... Take  1 Tablet By Mouth Once A Day 7)  Ra Chromium Picolinate 400 Mcg Tabs (Chromium Picolinate) .Marland Kitchen.. 1 Once Daily 8)  Bayer Aspirin 325 Mg  Tabs (Aspirin) .... 1/2 Once Daily 9)  K-99 595 Mg  Caps (Potassium Gluconate) .... Once Daily 10)  Multivitamins   Tabs (Multiple Vitamin) .... Once Daily 11)  Flonase 50 Mcg/act  Susp (Fluticasone Propionate) .... As Directed 12)  Ascensia Contour Test   Strp (Glucose Blood) .... Once Daily 13)  Omega-3 1000 Mg  Caps (Omega-3 Fatty Acids) .... 6 Per Day 14)  Magnebind 400 200-1-400 Mg  Tabs (Magnesium-Calcium-Folic Acid) .... Once Daily 15)  Calcium 600/vitamin D 600-400 Mg-Unit Tabs (Calcium Carbonate-Vitamin D) .Marland Kitchen.. 1 Once Daily 16)  Eye-Vites   Tabs (Multiple Vitamins-Minerals) .Marland Kitchen.. 1 Two Times A Day 17)  Cyanocobalamin 1000 Mcg/ml Inj Soln (Cyanocobalamin) .Marland Kitchen.. 1 Ml Per Month 18)  Docusate Sodium 100 Mg  Caps (Docusate Sodium) .... One  Three Times A Day 19)  Flomax 0.4 Mg Xr24h-Cap (Tamsulosin Hcl) .Marland Kitchen.. 1 Once Daily 20)  Vitamin C 100 Mg Tabs (Ascorbic Acid) .Marland Kitchen.. 1 Once Daily 21)  Vitamin E 400 Unit Caps (Vitamin E) .Marland Kitchen.. 1 Once Daily 22)  Melatonin 5 Mg Caps (Melatonin) .Marland Kitchen.. 1 At Bedtime 23)  Folbic 2.5-25-2 Mg Tabs (Fa-Pyridoxine-Cyancobalamin) .... One By Mouth Daily  Allergies (verified): No Known Drug Allergies  Past History:  Family History: Last updated: 20-Apr-2007 father died from CVA at age 41 mother died at 19  Social History: Last updated: 02/01/2007 Retired Divorced Never Smoked Alcohol use-yes  Risk Factors: Exercise: no (04/24/2007)  Risk Factors: Smoking Status: never (04/08/2010) Passive Smoke Exposure: no (04/08/2010)  Past medical, surgical, family and social histories (including risk factors) reviewed, and no changes noted (except as noted below).  Past Medical History: Reviewed history from 04/01/2008 and no changes required. Hypertension Diabetes Macular Degeneration-right eye Diabetes mellitus, type  II Dizziness or vertigo spinal stenosis oin lumbar spine  severe by MRI 2006 Hyperlipidemia constipation binge drinking ETOH abouse  Past Surgical History: Reviewed history from 04/24/2007 and no changes required. Appendectomy Cataract sx-left eye Hernia repair Left thumb sx-malignant skin cancer Colonoscopy lense replacement left eye Cervical fusion Cervical laminectomy  Family History: Reviewed history from Apr 20, 2007 and no changes required. father died from CVA at age 34 mother died at 41  Social History: Reviewed history from 02/01/2007 and no changes required. Retired Divorced Never Smoked Alcohol use-yes  Review of Systems  The patient denies anorexia, fever, weight loss, weight gain, vision loss, decreased hearing, hoarseness, chest pain, syncope, dyspnea on exertion, peripheral edema, prolonged cough, headaches, hemoptysis, abdominal pain, melena, hematochezia, severe indigestion/heartburn, hematuria, incontinence, genital sores, muscle weakness, suspicious skin lesions, transient blindness, difficulty walking, depression, unusual weight change, abnormal bleeding, enlarged lymph nodes, angioedema, breast masses, and testicular masses.         Flu Vaccine Consent Questions     Do you have a history of severe allergic reactions to this vaccine? no    Any prior history of allergic reactions to egg and/or gelatin? no    Do you have a sensitivity to the preservative Thimersol? no    Do you have a past history of Guillan-Barre Syndrome? no    Do you currently have an acute febrile illness? no    Have you ever had a severe reaction to latex? no    Vaccine information given and explained to patient? yes    Are you currently pregnant? no    Lot Number:[id number]   Exp Date:02/06/2011   Site Given  Left Deltoid IM   Physical Exam  General:  alert, well-hydrated, and overweight-appearing.   Head:  normocephalic and male-pattern balding.   Eyes:  pupils equal and  pupils round.   Ears:  R ear normal and L ear normal.   Nose:  no external erythema.   Mouth:  aphthous ulcer(s).   Neck:  No deformities, masses, or tenderness noted. Lungs:  normal respiratory effort and no dullness.   Heart:  tachycardia.   Abdomen:  soft, no guarding, no rigidity, and distended.   Msk:  no joint tenderness, no joint swelling, and no joint warmth.   Extremities:  1+ left pedal edema and 1+ right pedal edema.   Neurologic:  alert & oriented X3 and finger-to-nose normal.     Impression & Recommendations:  Problem # 1:  CARDIOMYOPATHY, SECONDARY-TACHYCARDIA MEDIATED (ICD-425.9) resolved per echo  Problem # 2:  HYPERLIPIDEMIA (ICD-272.4)  needs lipids Labs Reviewed: SGOT: 16 (07/23/2009)   SGPT: 21 (07/23/2009)  Prior 10 Yr Risk Heart Disease: 27 % (07/23/2009)   HDL:31.50 (07/23/2009), 30.20 (04/16/2009)  LDL:79 (04/16/2009), 91 (07/09/2008)  Chol:163 (07/23/2009), 136 (04/16/2009)  Trig:135.0 (04/16/2009), 160 (07/09/2008)  Problem # 3:  CONSTIPATION, SLOW TRANSIT (ICD-564.01) goes every 3-4 days mild distension no discomfort His updated medication list for this problem includes:    Docusate Sodium 100 Mg Caps (Docusate sodium) ..... One three times a day  Discussed dietary fiber measures and increased water intake.   Problem # 4:  DM W/NEURO MANIFESTATIONS, TYPE II (ICD-250.60)  His updated medication list for this problem includes:    Micardis 80 Mg Tabs (Telmisartan) ..... One by mouth daily    Glipizide Xl 5 Mg Xr24h-tab (Glipizide) .Marland Kitchen... 1 once daily    Kombiglyze Xr 2.12-998 Mg Xr24h-tab (Saxagliptin-metformin) ..... One by mouth daily    Bayer Aspirin 325 Mg Tabs (Aspirin) .Marland Kitchen... 1/2 once daily  Labs Reviewed: Creat: 0.8 (01/20/2010)     Last Eye Exam: normal (07/23/2009) Reviewed HgBA1c results: 7.2 (01/20/2010)  7.2 (07/23/2009)  Complete Medication List: 1)  Amitriptyline Hcl 75 Mg Tabs (Amitriptyline hcl) .... At bedtime 2)  Micardis 80 Mg  Tabs (Telmisartan) .... One by mouth daily 3)  Cosopt Soln (Dorzolamide-timolol soln) .... Two times a day 4)  Glipizide Xl 5 Mg Xr24h-tab (Glipizide) .Marland Kitchen.. 1 once daily 5)  Kombiglyze Xr 2.12-998 Mg Xr24h-tab (Saxagliptin-metformin) .... One by mouth daily 6)  Enablex 15 Mg Tb24 (Darifenacin hydrobromide) .... Take 1 tablet by mouth once a day 7)  Ra Chromium Picolinate 400 Mcg Tabs (Chromium picolinate) .Marland Kitchen.. 1 once daily 8)  Bayer Aspirin 325 Mg Tabs (Aspirin) .... 1/2 once daily 9)  K-99 595 Mg Caps (Potassium gluconate) .... Once daily 10)  Multivitamins Tabs (Multiple vitamin) .... Once daily 11)  Flonase 50 Mcg/act Susp (Fluticasone propionate) .... As directed 12)  Ascensia Contour Test Strp (Glucose blood) .... Once daily 13)  Omega-3 1000 Mg Caps (Omega-3 fatty acids) .... 6 per day 14)  Magnebind 400 200-1-400 Mg Tabs (Magnesium-calcium-folic acid) .... Once daily 15)  Calcium 600/vitamin D 600-400 Mg-unit Tabs (Calcium carbonate-vitamin d) .Marland Kitchen.. 1 once daily 16)  Eye-vites Tabs (Multiple vitamins-minerals) .Marland Kitchen.. 1 two times a day 17)  Cyanocobalamin 1000 Mcg/ml Inj Soln (Cyanocobalamin) .Marland Kitchen.. 1 ml per month 18)  Docusate Sodium 100 Mg Caps (Docusate sodium) .... One three times a day 19)  Flomax 0.4 Mg Xr24h-cap (Tamsulosin hcl) .Marland Kitchen.. 1 once daily 20)  Vitamin C 100 Mg Tabs (Ascorbic acid) .Marland Kitchen.. 1 once daily 21)  Vitamin E 400 Unit Caps (Vitamin e) .Marland Kitchen.. 1 once daily 22)  Melatonin 5 Mg Caps (Melatonin) .Marland Kitchen.. 1 at bedtime 23)  Folbic 2.5-25-2 Mg Tabs (Fa-pyridoxine-cyancobalamin) .... One by mouth daily  Other Orders: Vit B12 1000 mcg (J3420) Admin of Therapeutic Inj  intramuscular or subcutaneous (13086) Flu Vaccine 52yrs + (57846) Administration Flu vaccine - MCR (N6295)  Hypertension Assessment/Plan:      The patient's hypertensive risk group is category C: Target organ damage and/or diabetes.  His calculated 10 year risk of coronary heart disease is 27 %.  Today's blood pressure  is 130/80.  His blood pressure goal is < 130/80.  Patient Instructions: 1)  Please schedule a follow-up appointment in 2 months. 2)  Hepatic Panel prior to visit, ICD-9:995.20 3)  Lipid Panel prior to visit, ICD-9:272.4 4)  HbgA1C prior to visit, ICD-9:250.82   Medication Administration  Injection #  1:    Medication: Vit B12 1000 mcg    Diagnosis: VITAMIN B12 DEFICIENCY (ICD-266.2)    Route: IM    Site: L deltoid    Exp Date: 12/07/2011    Lot #: [id number]    Mfr: app    Patient tolerated injection without complications    Given by: Willy Eddy, LPN (April 08, 2010 1:45 PM)  Orders Added: 1)  Vit B12 1000 mcg [J3420] 2)  Admin of Therapeutic Inj  intramuscular or subcutaneous [96372] 3)  Est. Patient Level IV [45409] 4)  Flu Vaccine 50yrs + [90658] 5)  Administration Flu vaccine - MCR [G0008]

## 2010-09-10 NOTE — Progress Notes (Signed)
Summary: PA  Phone Note Call from Patient Call back at Home Phone [phone]   Caller: vm Reason for Call: Referral Summary of Call: [facility] non preferred.  Change med or call [phone] for PA.   Initial call taken by: Rudy Jew, RN,  Jan 06, 2010 5:12 PM  Follow-up for Phone Call        mediations changes in chart to micardis 80 may call in or may send to pharmacy or mail order Follow-up by: Stacie Glaze MD,  January 07, 2010 8:43 AM  Additional Follow-up for Phone Call Additional follow up Details #1::        pt informed Additional Follow-up by: Willy Eddy, LPN,  January 09, 2955 8:26 AM    New/Updated Medications: MICARDIS 80 MG TABS (TELMISARTAN) one by mouth daily Prescriptions: MICARDIS 80 MG TABS (TELMISARTAN) one by mouth daily  #90 x 3   Entered by:   Willy Eddy, LPN   Authorized by:   Stacie Glaze MD   Signed by:   Willy Eddy, LPN on 21/30/8657   Method used:   Electronically to        [facility]* (mail-order)             ,          Ph: [phone]       Fax: [phone]   RxID:   [id number]

## 2010-09-10 NOTE — Assessment & Plan Note (Signed)
Summary: B12 INJ // RS  Nurse Visit   Allergies: No Known Drug Allergies  Medication Administration  Injection # 1:    Medication: Vit B12 1000 mcg    Diagnosis: VITAMIN B12 DEFICIENCY (ICD-266.2)    Route: IM    Site: L deltoid    Exp Date: 05/08/2011    Lot #: [id number]    Mfr: [employer]    Patient tolerated injection without complications    Given by: Willy Eddy, LPN (November 20, 2009 12:47 PM)  Orders Added: 1)  Vit B12 1000 mcg [J3420] 2)  Admin of Therapeutic Inj  intramuscular or subcutaneous [16109]

## 2010-09-10 NOTE — Assessment & Plan Note (Signed)
Summary: Cardiology Nuclear Testing  Nuclear Med Background Indications for Stress Test: Evaluation for Ischemia, Post Hospital  Indications Comments: 02/27/10 [facility] with a.flutter>ablation  History: Ablation, Echo, Myocardial Perfusion Study  History Comments: '02 GLO:VFIEPP; 02/28/10 Echo:normal EF  Symptoms: Fatigue    Nuclear Pre-Procedure Cardiac Risk Factors: Family History - CAD, Hypertension, Lipids, NIDDM Caffeine/Decaff Intake: None NPO After: 8:00 AM Lungs: Clear.  O2 Sat 98% on RA. IV 0.9% NS with Angio Cath: 22g     IV Site: (R) Wrist IV Started by: Irean Hong, RN Chest Size (in) 46     Height (in): 68 Weight (lb): 212 BMI: 32.35  Nuclear Med Study 1 or 2 day study:  1 day     Stress Test Type:  Eugenie Birks Reading MD:  Charlton Haws, MD     Referring MD:  Berton Mount, MD Resting Radionuclide:  Technetium 64m Tetrofosmin     Resting Radionuclide Dose:  11 mCi  Stress Radionuclide:  Technetium 68m Tetrofosmin     Stress Radionuclide Dose:  33 mCi   Stress Protocol   Lexiscan: 0.4 mg   Stress Test Technologist:  Rea College, CMA-N     Nuclear Technologist:  Doyne Keel, CNMT  Rest Procedure  Myocardial perfusion imaging was performed at rest 45 minutes following the intravenous administration of Technetium 30m Tetrofosmin.  Stress Procedure  The patient received IV Lexiscan 0.4 mg over 15-seconds.  Technetium 73m Tetrofosmin injected at 30-seconds.  There were no significant changes with infusion.  Quantitative spect images were obtained after a 45 minute delay.  QPS Raw Data Images:  Normal; no motion artifact; normal heart/lung ratio. Stress Images:  Decreased apical counts Rest Images:  Decreased apical counts Subtraction (SDS):  SDS 5 Transient Ischemic Dilatation:  1.15  (Normal <1.22)  Lung/Heart Ratio:  0.32  (Normal <0.45)  Quantitative Gated Spect Images QGS EDV:  74 ml QGS ESV:  27 ml QGS EF:  64 % QGS cine images:  Apical  hypokinesis  Findings Low risk nuclear study  Evidence for anterior (septal apical) infarct     Overall Impression  Exercise Capacity: Lexiscan with no exercise. BP Response: Normal blood pressure response. Clinical Symptoms: No chest pain ECG Impression: No significant ST segment change suggestive of ischemia. Overall Impression: Small anteroapical infarct with no ischemia

## 2010-09-10 NOTE — Assessment & Plan Note (Signed)
Summary: 3 MONTH ROA//LH   Vital Signs:  Patient profile:   77 year old male Height:      68 inches Weight:      218 pounds BMI:     33.27 Temp:     97.9 degrees F oral Pulse rate:   72 / minute Resp:     14 per minute BP sitting:   124 / 80  (left arm)  Vitals Entered By: Willy Eddy, LPN (October 21, 2009 11:47 AM) CC: roa Is Patient Diabetic? Yes Did you bring your meter with you today? No   CC:  roa.  History of Present Illness: persistant problem with balance has not been in PT  a year ago DM in fair control no exercixing has had problems getting cost efective b vitamins for neuropathy discusson of PT referral and balance training due to nerve damage  Preventive Screening-Counseling & Management  Alcohol-Tobacco     Smoking Status: never     Passive Smoke Exposure: no  Current Problems (verified): 1)  Erectile Dysfunction, Organic  (ICD-607.84) 2)  Pityriasis Rosea  (ICD-696.3) 3)  Preventive Health Care  (ICD-V70.0) 4)  Weight Gain  (ICD-783.1) 5)  Cellulitis and Abscess of Upper Arm and Forearm  (ICD-682.3) 6)  Edema  (ICD-782.3) 7)  Urinary Frequency, Chronic  (ICD-788.41) 8)  Ben Loc Hyperplasia Pros w/o Ur Obst & Oth Luts  (ICD-600.20) 9)  Glossitis  (ICD-529.0) 10)  Hyperlipidemia  (ICD-272.4) 11)  Constipation, Slow Transit  (ICD-564.01) 12)  Vitamin B12 Deficiency  (ICD-266.2) 13)  Stenosis, Lumbar Spine  (ICD-724.02) 14)  Preventive Health Care  (ICD-V70.0) 15)  Disorder, Cervical Disc W/myelopathy  (ICD-722.71) 16)  Dizziness or Vertigo  (ICD-780.4) 17)  Dm W/neuro Manifestations, Type II  (ICD-250.60) 18)  Hypertension  (ICD-401.9)  Current Medications (verified): 1)  Amitriptyline Hcl 75 Mg  Tabs (Amitriptyline Hcl) .... At Bedtime 2)  Avapro 300 Mg  Tabs (Irbesartan) .... Once Daily 3)  Cosopt   Soln (Dorzolamide-Timolol Soln) .... Two Times A Day 4)  Glipizide Xl 5 Mg Xr24h-Tab (Glipizide) .Marland Kitchen.. 1 Once Daily 5)  Metformin Hcl 500 Mg   Tabs (Metformin Hcl) .... Two Times A Day 6)  Enablex 15 Mg  Tb24 (Darifenacin Hydrobromide) .... Take 1 Tablet By Mouth Once A Day 7)  Ra Chromium Picolinate 400 Mcg Tabs (Chromium Picolinate) .Marland Kitchen.. 1 Once Daily 8)  Bayer Aspirin 325 Mg  Tabs (Aspirin) .... 1/2 Once Daily 9)  K-99 595 Mg  Caps (Potassium Gluconate) .... Once Daily 10)  Multivitamins   Tabs (Multiple Vitamin) .... Once Daily 11)  Flonase 50 Mcg/act  Susp (Fluticasone Propionate) .... As Directed 12)  Ascensia Contour Test   Strp (Glucose Blood) .... Once Daily 13)  Omega-3 1000 Mg  Caps (Omega-3 Fatty Acids) .... 6 Per Day 14)  Magnebind 400 200-1-400 Mg  Tabs (Magnesium-Calcium-Folic Acid) .... Once Daily 15)  Calcium 600/vitamin D 600-400 Mg-Unit Tabs (Calcium Carbonate-Vitamin D) .Marland Kitchen.. 1 Once Daily 16)  Eye-Vites   Tabs (Multiple Vitamins-Minerals) .Marland Kitchen.. 1 Two Times A Day 17)  Cyanocobalamin 1000 Mcg/ml Inj Soln (Cyanocobalamin) .Marland Kitchen.. 1 Ml Per Month 18)  Docusate Sodium 100 Mg  Caps (Docusate Sodium) .... One Three Times A Day 19)  Flomax 0.4 Mg Xr24h-Cap (Tamsulosin Hcl) .Marland Kitchen.. 1 Once Daily 20)  Vitamin C 100 Mg Tabs (Ascorbic Acid) .Marland Kitchen.. 1 Once Daily 21)  Vitamin E 400 Unit Caps (Vitamin E) .Marland Kitchen.. 1 Once Daily 22)  Melatonin 5 Mg Caps (Melatonin) .Marland KitchenMarland KitchenMarland Kitchen  2 At Bedtime 23)  Folbic 2.5-25-2 Mg Tabs (Fa-Pyridoxine-Cyancobalamin) .... One By Mouth Bid  Allergies (verified): No Known Drug Allergies  Past History:  Family History: Last updated: 04-09-2007 father died from CVA at age 32 mother died at 80  Social History: Last updated: 02/01/2007 Retired Divorced Never Smoked Alcohol use-yes  Risk Factors: Exercise: no (04/24/2007)  Risk Factors: Smoking Status: never (10/21/2009) Passive Smoke Exposure: no (10/21/2009)  Past medical, surgical, family and social histories (including risk factors) reviewed, and no changes noted (except as noted below).  Past Medical History: Reviewed history from 04/01/2008 and no  changes required. Hypertension Diabetes Macular Degeneration-right eye Diabetes mellitus, type II Dizziness or vertigo spinal stenosis oin lumbar spine  severe by MRI 2006 Hyperlipidemia constipation binge drinking ETOH abouse  Past Surgical History: Reviewed history from 04/24/2007 and no changes required. Appendectomy Cataract sx-left eye Hernia repair Left thumb sx-malignant skin cancer Colonoscopy lense replacement left eye Cervical fusion Cervical laminectomy  Family History: Reviewed history from 04/09/07 and no changes required. father died from CVA at age 73 mother died at 52  Social History: Reviewed history from 02/01/2007 and no changes required. Retired Divorced Never Smoked Alcohol use-yes  Review of Systems  The patient denies anorexia, fever, weight loss, weight gain, vision loss, decreased hearing, hoarseness, chest pain, syncope, dyspnea on exertion, peripheral edema, prolonged cough, headaches, hemoptysis, abdominal pain, melena, hematochezia, severe indigestion/heartburn, hematuria, incontinence, genital sores, muscle weakness, suspicious skin lesions, transient blindness, difficulty walking, depression, unusual weight change, abnormal bleeding, enlarged lymph nodes, angioedema, and breast masses.    Physical Exam  General:  Well-developed,well-nourished,in no acute distress; alert,appropriate and cooperative throughout examination Head:  normocephalic and male-pattern balding.   Eyes:  pupils equal and pupils round.   Ears:  R ear normal and L ear normal.   Nose:  no external erythema.   Mouth:  pharynx pink and moist.   Neck:  No deformities, masses, or tenderness noted. Lungs:  Normal respiratory effort, chest expands symmetrically. Lungs are clear to auscultation, no crackles or wheezes. Heart:  Normal rate and regular rhythm. S1 and S2 normal without gallop, murmur, click, rub or other extra sounds. Abdomen:  Bowel sounds positive,abdomen  soft and non-tender without masses, organomegaly or hernias noted.distended.   Msk:  no joint tenderness, no joint swelling, and no joint warmth.   Pulses:  R and L carotid,radial,femoral,dorsalis pedis and posterior tibial pulses are full and equal bilaterally Extremities:  1+ left pedal edema and 1+ right pedal edema.   Neurologic:  loss of  sensory proprioception   Impression & Recommendations:  Problem # 1:  DM W/NEURO MANIFESTATIONS, TYPE II (ICD-250.60)  balance issue His updated medication list for this problem includes:    Avapro 300 Mg Tabs (Irbesartan) ..... Once daily    Glipizide Xl 5 Mg Xr24h-tab (Glipizide) .Marland Kitchen... 1 once daily    Metformin Hcl 500 Mg Tabs (Metformin hcl) .Marland Kitchen..Marland Kitchen Two times a day    Bayer Aspirin 325 Mg Tabs (Aspirin) .Marland Kitchen... 1/2 once daily  Orders: Physical Therapy Referral (PT)  Complete Medication List: 1)  Amitriptyline Hcl 75 Mg Tabs (Amitriptyline hcl) .... At bedtime 2)  Avapro 300 Mg Tabs (Irbesartan) .... Once daily 3)  Cosopt Soln (Dorzolamide-timolol soln) .... Two times a day 4)  Glipizide Xl 5 Mg Xr24h-tab (Glipizide) .Marland Kitchen.. 1 once daily 5)  Metformin Hcl 500 Mg Tabs (Metformin hcl) .... Two times a day 6)  Enablex 15 Mg Tb24 (Darifenacin hydrobromide) .... Take 1 tablet by  mouth once a day 7)  Ra Chromium Picolinate 400 Mcg Tabs (Chromium picolinate) .Marland Kitchen.. 1 once daily 8)  Bayer Aspirin 325 Mg Tabs (Aspirin) .... 1/2 once daily 9)  K-99 595 Mg Caps (Potassium gluconate) .... Once daily 10)  Multivitamins Tabs (Multiple vitamin) .... Once daily 11)  Flonase 50 Mcg/act Susp (Fluticasone propionate) .... As directed 12)  Ascensia Contour Test Strp (Glucose blood) .... Once daily 13)  Omega-3 1000 Mg Caps (Omega-3 fatty acids) .... 6 per day 14)  Magnebind 400 200-1-400 Mg Tabs (Magnesium-calcium-folic acid) .... Once daily 15)  Calcium 600/vitamin D 600-400 Mg-unit Tabs (Calcium carbonate-vitamin d) .Marland Kitchen.. 1 once daily 16)  Eye-vites Tabs (Multiple  vitamins-minerals) .Marland Kitchen.. 1 two times a day 17)  Cyanocobalamin 1000 Mcg/ml Inj Soln (Cyanocobalamin) .Marland Kitchen.. 1 ml per month 18)  Docusate Sodium 100 Mg Caps (Docusate sodium) .... One three times a day 19)  Flomax 0.4 Mg Xr24h-cap (Tamsulosin hcl) .Marland Kitchen.. 1 once daily 20)  Vitamin C 100 Mg Tabs (Ascorbic acid) .Marland Kitchen.. 1 once daily 21)  Vitamin E 400 Unit Caps (Vitamin e) .Marland Kitchen.. 1 once daily 22)  Melatonin 5 Mg Caps (Melatonin) .... 2 at bedtime 23)  Folbic 2.5-25-2 Mg Tabs (Fa-pyridoxine-cyancobalamin) .... One by mouth daily  Patient Instructions: 1)  referral "art" physical therapy fro balance training 2)  Please schedule a follow-up appointment in 3 months. Prescriptions: FOLBIC 2.5-25-2 MG TABS (FA-PYRIDOXINE-CYANCOBALAMIN) one by mouth daily  #90 x 3   Entered and Authorized by:   Stacie Glaze MD   Signed by:   Stacie Glaze MD on 10/21/2009   Method used:   Print then Give to Patient   RxID:   [id number] FOLBEE 2.5-25-1 MG TABS (FOLIC ACID-VIT B6-VIT B12) one by mouth daily  #30 x 11   Entered and Authorized by:   Stacie Glaze MD   Signed by:   Stacie Glaze MD on 10/21/2009   Method used:   Print then Give to Patient   RxID:   [id number]

## 2010-09-10 NOTE — Miscellaneous (Signed)
Summary: [facility]  [place] [facility]   Imported By: Maryln Gottron 11/25/2009 12:29:35  _____________________________________________________________________  External Attachment:    Type:   Image     Comment:   External Document

## 2010-09-10 NOTE — Assessment & Plan Note (Signed)
Summary: B12 INJ // RS  Nurse Visit   Allergies: No Known Drug Allergies  Medication Administration  Injection # 1:    Medication: Vit B12 1000 mcg    Diagnosis: VITAMIN B12 DEFICIENCY (ICD-266.2)    Route: IM    Site: L deltoid    Exp Date: 05/08/2011    Lot #: [id number]    Mfr: [employer]    Patient tolerated injection without complications    Given by: Willy Eddy, LPN (September 25, 2009 12:19 PM)  Orders Added: 1)  Vit B12 1000 mcg [J3420] 2)  Admin of Therapeutic Inj  intramuscular or subcutaneous [62130]

## 2010-09-10 NOTE — Assessment & Plan Note (Signed)
Summary: 3 month roa/njr   Vital Signs:  Patient profile:   77 year old male Height:      68 inches Weight:      220 pounds BMI:     33.57 Temp:     98.2 degrees F oral Pulse rate:   72 / minute Resp:     14 per minute BP sitting:   130 / 80  (left arm)  Vitals Entered By: Willy Eddy, LPN (January 20, 2010 11:49 AM) CC: roa- changed from avapro to micardis due to insurance- has not started micardis , still has 1 month of avapro remaining-c/o mouth ulcers and usnteady gair, Hypertension Management   CC:  roa- changed from avapro to micardis due to insurance- has not started micardis , still has 1 month of avapro remaining-c/o mouth ulcers and usnteady gair, and Hypertension Management.  History of Present Illness: The b12 injections have not seemed to help the numbness in the feet has sore on his tongue has been bitting tongue and cheek has seen his denist and the leson have cleared  Hypertension History:      He denies headache, chest pain, palpitations, dyspnea with exertion, orthopnea, PND, peripheral edema, visual symptoms, neurologic problems, syncope, and side effects from treatment.        Positive major cardiovascular risk factors include male age 87 years old or older, diabetes, hyperlipidemia, and hypertension.  Negative major cardiovascular risk factors include negative family history for ischemic heart disease and non-tobacco-user status.     Preventive Screening-Counseling & Management  Alcohol-Tobacco     Smoking Status: never     Passive Smoke Exposure: no  Problems Prior to Update: 1)  Risk of Falling  (ICD-V15.88) 2)  Erectile Dysfunction, Organic  (ICD-607.84) 3)  Pityriasis Rosea  (ICD-696.3) 4)  Preventive Health Care  (ICD-V70.0) 5)  Weight Gain  (ICD-783.1) 6)  Cellulitis and Abscess of Upper Arm and Forearm  (ICD-682.3) 7)  Edema  (ICD-782.3) 8)  Urinary Frequency, Chronic  (ICD-788.41) 9)  Ben Loc Hyperplasia Pros w/o Ur Obst & Oth Luts   (ICD-600.20) 10)  Glossitis  (ICD-529.0) 11)  Hyperlipidemia  (ICD-272.4) 12)  Constipation, Slow Transit  (ICD-564.01) 13)  Vitamin B12 Deficiency  (ICD-266.2) 14)  Stenosis, Lumbar Spine  (ICD-724.02) 15)  Preventive Health Care  (ICD-V70.0) 16)  Disorder, Cervical Disc W/myelopathy  (ICD-722.71) 17)  Dizziness or Vertigo  (ICD-780.4) 18)  Dm W/neuro Manifestations, Type II  (ICD-250.60) 19)  Hypertension  (ICD-401.9)  Current Problems (verified): 1)  Risk of Falling  (ICD-V15.88) 2)  Erectile Dysfunction, Organic  (ICD-607.84) 3)  Pityriasis Rosea  (ICD-696.3) 4)  Preventive Health Care  (ICD-V70.0) 5)  Weight Gain  (ICD-783.1) 6)  Cellulitis and Abscess of Upper Arm and Forearm  (ICD-682.3) 7)  Edema  (ICD-782.3) 8)  Urinary Frequency, Chronic  (ICD-788.41) 9)  Ben Loc Hyperplasia Pros w/o Ur Obst & Oth Luts  (ICD-600.20) 10)  Glossitis  (ICD-529.0) 11)  Hyperlipidemia  (ICD-272.4) 12)  Constipation, Slow Transit  (ICD-564.01) 13)  Vitamin B12 Deficiency  (ICD-266.2) 14)  Stenosis, Lumbar Spine  (ICD-724.02) 15)  Preventive Health Care  (ICD-V70.0) 16)  Disorder, Cervical Disc W/myelopathy  (ICD-722.71) 17)  Dizziness or Vertigo  (ICD-780.4) 18)  Dm W/neuro Manifestations, Type II  (ICD-250.60) 19)  Hypertension  (ICD-401.9)  Medications Prior to Update: 1)  Amitriptyline Hcl 75 Mg  Tabs (Amitriptyline Hcl) .... At Bedtime 2)  Micardis 80 Mg Tabs (Telmisartan) .... One By Mouth Daily 3)  Cosopt   Soln (Dorzolamide-Timolol Soln) .... Two Times A Day 4)  Glipizide Xl 5 Mg Xr24h-Tab (Glipizide) .Marland Kitchen.. 1 Once Daily 5)  Metformin Hcl 500 Mg  Tabs (Metformin Hcl) .... Two Times A Day 6)  Enablex 15 Mg  Tb24 (Darifenacin Hydrobromide) .... Take 1 Tablet By Mouth Once A Day 7)  Ra Chromium Picolinate 400 Mcg Tabs (Chromium Picolinate) .Marland Kitchen.. 1 Once Daily 8)  Bayer Aspirin 325 Mg  Tabs (Aspirin) .... 1/2 Once Daily 9)  K-99 595 Mg  Caps (Potassium Gluconate) .... Once Daily 10)   Multivitamins   Tabs (Multiple Vitamin) .... Once Daily 11)  Flonase 50 Mcg/act  Susp (Fluticasone Propionate) .... As Directed 12)  Ascensia Contour Test   Strp (Glucose Blood) .... Once Daily 13)  Omega-3 1000 Mg  Caps (Omega-3 Fatty Acids) .... 6 Per Day 14)  Magnebind 400 200-1-400 Mg  Tabs (Magnesium-Calcium-Folic Acid) .... Once Daily 15)  Calcium 600/vitamin D 600-400 Mg-Unit Tabs (Calcium Carbonate-Vitamin D) .Marland Kitchen.. 1 Once Daily 16)  Eye-Vites   Tabs (Multiple Vitamins-Minerals) .Marland Kitchen.. 1 Two Times A Day 17)  Cyanocobalamin 1000 Mcg/ml Inj Soln (Cyanocobalamin) .Marland Kitchen.. 1 Ml Per Month 18)  Docusate Sodium 100 Mg  Caps (Docusate Sodium) .... One Three Times A Day 19)  Flomax 0.4 Mg Xr24h-Cap (Tamsulosin Hcl) .Marland Kitchen.. 1 Once Daily 20)  Vitamin C 100 Mg Tabs (Ascorbic Acid) .Marland Kitchen.. 1 Once Daily 21)  Vitamin E 400 Unit Caps (Vitamin E) .Marland Kitchen.. 1 Once Daily 22)  Melatonin 5 Mg Caps (Melatonin) .... 2 At Bedtime 23)  Folbic 2.5-25-2 Mg Tabs (Fa-Pyridoxine-Cyancobalamin) .... One By Mouth Daily  Current Medications (verified): 1)  Amitriptyline Hcl 75 Mg  Tabs (Amitriptyline Hcl) .... At Bedtime 2)  Micardis 80 Mg Tabs (Telmisartan) .... One By Mouth Daily 3)  Cosopt   Soln (Dorzolamide-Timolol Soln) .... Two Times A Day 4)  Glipizide Xl 5 Mg Xr24h-Tab (Glipizide) .Marland Kitchen.. 1 Once Daily 5)  Metformin Hcl 500 Mg  Tabs (Metformin Hcl) .... Two Times A Day 6)  Enablex 15 Mg  Tb24 (Darifenacin Hydrobromide) .... Take 1 Tablet By Mouth Once A Day 7)  Ra Chromium Picolinate 400 Mcg Tabs (Chromium Picolinate) .Marland Kitchen.. 1 Once Daily 8)  Bayer Aspirin 325 Mg  Tabs (Aspirin) .... 1/2 Once Daily 9)  K-99 595 Mg  Caps (Potassium Gluconate) .... Once Daily 10)  Multivitamins   Tabs (Multiple Vitamin) .... Once Daily 11)  Flonase 50 Mcg/act  Susp (Fluticasone Propionate) .... As Directed 12)  Ascensia Contour Test   Strp (Glucose Blood) .... Once Daily 13)  Omega-3 1000 Mg  Caps (Omega-3 Fatty Acids) .... 6 Per Day 14)   Magnebind 400 200-1-400 Mg  Tabs (Magnesium-Calcium-Folic Acid) .... Once Daily 15)  Calcium 600/vitamin D 600-400 Mg-Unit Tabs (Calcium Carbonate-Vitamin D) .Marland Kitchen.. 1 Once Daily 16)  Eye-Vites   Tabs (Multiple Vitamins-Minerals) .Marland Kitchen.. 1 Two Times A Day 17)  Cyanocobalamin 1000 Mcg/ml Inj Soln (Cyanocobalamin) .Marland Kitchen.. 1 Ml Per Month 18)  Docusate Sodium 100 Mg  Caps (Docusate Sodium) .... One Three Times A Day 19)  Flomax 0.4 Mg Xr24h-Cap (Tamsulosin Hcl) .Marland Kitchen.. 1 Once Daily 20)  Vitamin C 100 Mg Tabs (Ascorbic Acid) .Marland Kitchen.. 1 Once Daily 21)  Vitamin E 400 Unit Caps (Vitamin E) .Marland Kitchen.. 1 Once Daily 22)  Melatonin 5 Mg Caps (Melatonin) .... 2 At Bedtime 23)  Folbic 2.5-25-2 Mg Tabs (Fa-Pyridoxine-Cyancobalamin) .... One By Mouth Daily  Allergies (verified): No Known Drug Allergies  Past History:  Family History:  Last updated: 03/23/2007 father died from CVA at age 27 mother died at 71  Social History: Last updated: 02/01/2007 Retired Divorced Never Smoked Alcohol use-yes  Risk Factors: Exercise: no (04/24/2007)  Risk Factors: Smoking Status: never (01/20/2010) Passive Smoke Exposure: no (01/20/2010)  Past medical, surgical, family and social histories (including risk factors) reviewed, and no changes noted (except as noted below).  Past Medical History: Reviewed history from 04/01/2008 and no changes required. Hypertension Diabetes Macular Degeneration-right eye Diabetes mellitus, type II Dizziness or vertigo spinal stenosis oin lumbar spine  severe by MRI 2006 Hyperlipidemia constipation binge drinking ETOH abouse  Past Surgical History: Reviewed history from 04/24/2007 and no changes required. Appendectomy Cataract sx-left eye Hernia repair Left thumb sx-malignant skin cancer Colonoscopy lense replacement left eye Cervical fusion Cervical laminectomy  Family History: Reviewed history from 03/23/2007 and no changes required. father died from CVA at age 29 mother died  at 28  Social History: Reviewed history from 02/01/2007 and no changes required. Retired Divorced Never Smoked Alcohol use-yes  Review of Systems  The patient denies anorexia, fever, weight loss, weight gain, vision loss, decreased hearing, hoarseness, chest pain, syncope, dyspnea on exertion, peripheral edema, prolonged cough, headaches, hemoptysis, abdominal pain, melena, hematochezia, severe indigestion/heartburn, hematuria, incontinence, genital sores, muscle weakness, suspicious skin lesions, transient blindness, difficulty walking, depression, unusual weight change, abnormal bleeding, enlarged lymph nodes, angioedema, breast masses, and testicular masses.    Physical Exam  General:  Well-developed,well-nourished,in no acute distress; alert,appropriate and cooperative throughout examination Head:  normocephalic and male-pattern balding.   Eyes:  pupils equal and pupils round.   Ears:  R ear normal and L ear normal.   Nose:  no external erythema.   Mouth:  aphthous ulcer(s).   Neck:  No deformities, masses, or tenderness noted. Lungs:  Normal respiratory effort, chest expands symmetrically. Lungs are clear to auscultation, no crackles or wheezes. Heart:  Normal rate and regular rhythm. S1 and S2 normal without gallop, murmur, click, rub or other extra sounds. Abdomen:  Bowel sounds positive,abdomen soft and non-tender without masses, organomegaly or hernias noted.distended.   Msk:  no joint tenderness, no joint swelling, and no joint warmth.   Extremities:  1+ left pedal edema and 1+ right pedal edema.   Neurologic:  loss of  sensory proprioception   Impression & Recommendations:  Problem # 1:  APHTHOUS ULCERS (ICD-528.2) has seen denist and has been referred for bx  but this look like aphatous stomatitis and will treat with zovirax and MMW if does not improve will refer to oral surgeon  dukes mouth wash and zovirax fro 10 days  Problem # 2:  GLOSSITIS  (ICD-529.0) chronic on b12 shots  Problem # 3:  DM W/NEURO MANIFESTATIONS, TYPE II (ICD-250.60)  moniter a1c His updated medication list for this problem includes:    Micardis 80 Mg Tabs (Telmisartan) ..... One by mouth daily    Glipizide Xl 5 Mg Xr24h-tab (Glipizide) .Marland Kitchen... 1 once daily    Metformin Hcl 500 Mg Tabs (Metformin hcl) .Marland Kitchen..Marland Kitchen Two times a day    Bayer Aspirin 325 Mg Tabs (Aspirin) .Marland Kitchen... 1/2 once daily  Labs Reviewed: Creat: 0.7 (07/23/2009)     Last Eye Exam: normal (07/23/2009) Reviewed HgBA1c results: 7.2 (07/23/2009)  6.9 (04/16/2009) last check  was moderately eleavted  Orders: TLB-A1C / Hgb A1C (Glycohemoglobin) (83036-A1C)  Problem # 4:  HYPERTENSION (ICD-401.9) Assessment: Unchanged  His updated medication list for this problem includes:    Micardis 80 Mg Tabs (Telmisartan) .Marland KitchenMarland KitchenMarland KitchenMarland Kitchen  One by mouth daily  BP today: 130/80 Prior BP: 124/80 (10/21/2009)  Prior 10 Yr Risk Heart Disease: 27 % (07/23/2009)  Labs Reviewed: K+: 4.7 (07/23/2009) Creat: : 0.7 (07/23/2009)   Chol: 163 (07/23/2009)   HDL: 31.50 (07/23/2009)   LDL: 79 (04/16/2009)   TG: 135.0 (04/16/2009)  Orders: Venipuncture (16109) TLB-BMP (Basic Metabolic Panel-BMET) (80048-METABOL)  Complete Medication List: 1)  Amitriptyline Hcl 75 Mg Tabs (Amitriptyline hcl) .... At bedtime 2)  Micardis 80 Mg Tabs (Telmisartan) .... One by mouth daily 3)  Cosopt Soln (Dorzolamide-timolol soln) .... Two times a day 4)  Glipizide Xl 5 Mg Xr24h-tab (Glipizide) .Marland Kitchen.. 1 once daily 5)  Metformin Hcl 500 Mg Tabs (Metformin hcl) .... Two times a day 6)  Enablex 15 Mg Tb24 (Darifenacin hydrobromide) .... Take 1 tablet by mouth once a day 7)  Ra Chromium Picolinate 400 Mcg Tabs (Chromium picolinate) .Marland Kitchen.. 1 once daily 8)  Bayer Aspirin 325 Mg Tabs (Aspirin) .... 1/2 once daily 9)  K-99 595 Mg Caps (Potassium gluconate) .... Once daily 10)  Multivitamins Tabs (Multiple vitamin) .... Once daily 11)  Flonase 50 Mcg/act  Susp (Fluticasone propionate) .... As directed 12)  Ascensia Contour Test Strp (Glucose blood) .... Once daily 13)  Omega-3 1000 Mg Caps (Omega-3 fatty acids) .... 6 per day 14)  Magnebind 400 200-1-400 Mg Tabs (Magnesium-calcium-folic acid) .... Once daily 15)  Calcium 600/vitamin D 600-400 Mg-unit Tabs (Calcium carbonate-vitamin d) .Marland Kitchen.. 1 once daily 16)  Eye-vites Tabs (Multiple vitamins-minerals) .Marland Kitchen.. 1 two times a day 17)  Cyanocobalamin 1000 Mcg/ml Inj Soln (Cyanocobalamin) .Marland Kitchen.. 1 ml per month 18)  Docusate Sodium 100 Mg Caps (Docusate sodium) .... One three times a day 19)  Flomax 0.4 Mg Xr24h-cap (Tamsulosin hcl) .Marland Kitchen.. 1 once daily 20)  Vitamin C 100 Mg Tabs (Ascorbic acid) .Marland Kitchen.. 1 once daily 21)  Vitamin E 400 Unit Caps (Vitamin e) .Marland Kitchen.. 1 once daily 22)  Melatonin 5 Mg Caps (Melatonin) .... 2 at bedtime 23)  Folbic 2.5-25-2 Mg Tabs (Fa-pyridoxine-cyancobalamin) .... One by mouth daily 24)  Acyclovir 800 Mg Tabs (Acyclovir) .... One by mouth three times a day 25)  First-dukes Mouthwash Susp (Diphenhyd-hydrocort-nystatin) .Marland Kitchen.. 10 cc gargle and spit three times a day  Other Orders: Vit B12 1000 mcg (J3420) Admin of Therapeutic Inj  intramuscular or subcutaneous (60454)  Hypertension Assessment/Plan:      The patient's hypertensive risk group is category C: Target organ damage and/or diabetes.  His calculated 10 year risk of coronary heart disease is 27 %.  Today's blood pressure is 130/80.  His blood pressure goal is < 130/80.  Patient Instructions: 1)  Please schedule a follow-up appointment in 1 month. Prescriptions: FIRST-DUKES MOUTHWASH  SUSP (DIPHENHYD-HYDROCORT-NYSTATIN) 10 cc gargle and spit three times a day  #300cc x 1   Entered and Authorized by:   Stacie Glaze MD   Signed by:   Stacie Glaze MD on 01/20/2010   Method used:   Electronically to        [facility] [address] (retail)       [address], [address]       [place], [place]   [address]       Ph: [phone]       Fax: [phone]   RxID:   [id number] ACYCLOVIR 800 MG TABS (ACYCLOVIR) one by mouth three times a day  #30 x 0   Entered and Authorized  by:   Stacie Glaze MD   Signed by:   Stacie Glaze MD on 01/20/2010   Method used:   Electronically to        [facility] [address] (retail)       [address], [address]       [place], [place]  [address]       Ph: [phone]       Fax: [phone]   RxID:   [id number]    Medication Administration  Injection # 1:    Medication: Vit B12 1000 mcg    Diagnosis: VITAMIN B12 DEFICIENCY (ICD-266.2)    Route: IM    Site: R deltoid    Exp Date: 05/07/2011    Lot #: [id number]    Mfr: [employer]    Patient tolerated injection without complications    Given by: Willy Eddy, LPN (January 20, 2010 11:51 AM)  Orders Added: 1)  Vit B12 1000 mcg [J3420] 2)  Admin of Therapeutic Inj  intramuscular or subcutaneous [96372] 3)  Est. Patient Level IV [40102] 4)  Venipuncture [36415] 5)  TLB-BMP (Basic Metabolic Panel-BMET) [80048-METABOL] 6)  TLB-A1C / Hgb A1C (Glycohemoglobin) [83036-A1C]

## 2010-09-10 NOTE — Assessment & Plan Note (Signed)
Summary: eph/ gd   History of Present Illness: Mr. Nathan Cunningham is seen in followup for atrial flutter for which he underwent catheter ablation.He was also found to have LV dysfunction 35%  he had a nuclear scan this am the results  of which are pending; although preliminary report of left ventricular function is 65%  Allergies: No Known Drug Allergies  Physical Exam  General:  The patient was alert and oriented in no acute distress. HEENT Normal.  Neck veins were flat, carotids were brisk.  Lungs were clear.  Heart sounds were regular without murmurs or gallops.  Abdomen was soft with active bowel sounds. There is no clubbing cyanosis or edema. Skin Warm and dry    EKG  Procedure date:  03/31/2010  Findings:      sinus rhythm at 80 Intervals 0.17/0.09/0.35 Axis of 12 Poor R-wave progression  Impression & Recommendations:  Problem # 1:  CARDIOMYOPATHY, SECONDARY-TACHYCARDIA MEDIATED (ICD-425.9) his tachycardia mediated cardiomyopathy appears to have resolved. Ejection fraction today was normal. This evokes no changes in his medications. His updated medication list for this problem includes:    Micardis 80 Mg Tabs (Telmisartan) ..... One by mouth daily    Bayer Aspirin 325 Mg Tabs (Aspirin) .Marland Kitchen... 1/2 once daily  Problem # 2:  ATRIAL FLUTTER (ICD-427.32) status post catheter ablation. I've instructed him how to take his pulse. He is to notify us or Dr. Lovell Sheehan in the event that he has an irregular pulse as the likelihood of atrial fibrillation manifesting is probably greater than 10-25%.  We will stop his Pradaxa His updated medication list for this problem includes:    Bayer Aspirin 325 Mg Tabs (Aspirin) .Marland Kitchen... 1/2 once daily  Patient Instructions: 1)  Your physician recommends that you schedule a follow-up appointment in: 6 months WITH DR Graciela Husbands 2)  Your physician has recommended you make the following change in your medication: STOP PRADAXA

## 2010-09-10 NOTE — Assessment & Plan Note (Signed)
Summary: 2 MTH ROV // RS   Vital Signs:  Patient profile:   77 year old male Height:      68 inches Weight:      216 pounds BMI:     32.96 Temp:     98.2 degrees F oral Pulse rate:   80 / minute Resp:     14 per minute BP sitting:   122 / 80  (left arm)  Vitals Entered By: Willy Eddy, LPN (June 17, 2010 11:40 AM) CC: roa labs Is Patient Diabetic? Yes Did you bring your meter with you today? No   Visit Type:  monitering of lipids Primary Care Provider:  Stacie Glaze MD  CC:  roa labs.  History of Present Illness: monitering lipids and A!C  Hyperlipidemia Follow-Up      This is an 77 year old man who presents for Hyperlipidemia follow-up.  The patient denies muscle aches, GI upset, abdominal pain, flushing, itching, constipation, diarrhea, and fatigue.  The patient denies the following symptoms: chest pain/pressure, exercise intolerance, dypsnea, palpitations, syncope, and pedal edema.  Compliance with medications (by patient report) has been near 100%.  Dietary compliance has been good.  The patient reports no exercise.  Adjunctive measures currently used by the patient include fish oil supplements.    balance issue and falls ( prior PT with [facility]) discussion of the 1000 step program  Preventive Screening-Counseling & Management  Alcohol-Tobacco     Smoking Status: never     Passive Smoke Exposure: no     Tobacco Counseling: not indicated; no tobacco use  Problems Prior to Update: 1)  Cardiomyopathy, Secondary-tachycardia Mediated  (ICD-425.9) 2)  Atrial Flutter  (ICD-427.32) 3)  Aphthous Ulcers  (ICD-528.2) 4)  Risk of Falling  (ICD-V15.88) 5)  Erectile Dysfunction, Organic  (ICD-607.84) 6)  Pityriasis Rosea  (ICD-696.3) 7)  Preventive Health Care  (ICD-V70.0) 8)  Weight Gain  (ICD-783.1) 9)  Cellulitis and Abscess of Upper Arm and Forearm  (ICD-682.3) 10)  Edema  (ICD-782.3) 11)  Urinary Frequency, Chronic  (ICD-788.41) 12)  Ben Loc Hyperplasia Pros  w/o Ur Obst & Oth Luts  (ICD-600.20) 13)  Glossitis  (ICD-529.0) 14)  Hyperlipidemia  (ICD-272.4) 15)  Constipation, Slow Transit  (ICD-564.01) 16)  Vitamin B12 Deficiency  (ICD-266.2) 17)  Stenosis, Lumbar Spine  (ICD-724.02) 18)  Preventive Health Care  (ICD-V70.0) 19)  Disorder, Cervical Disc W/myelopathy  (ICD-722.71) 20)  Dizziness or Vertigo  (ICD-780.4) 21)  Dm W/neuro Manifestations, Type II  (ICD-250.60) 22)  Hypertension  (ICD-401.9)  Current Problems (verified): 1)  Cardiomyopathy, Secondary-tachycardia Mediated  (ICD-425.9) 2)  Atrial Flutter  (ICD-427.32) 3)  Aphthous Ulcers  (ICD-528.2) 4)  Risk of Falling  (ICD-V15.88) 5)  Erectile Dysfunction, Organic  (ICD-607.84) 6)  Pityriasis Rosea  (ICD-696.3) 7)  Preventive Health Care  (ICD-V70.0) 8)  Weight Gain  (ICD-783.1) 9)  Cellulitis and Abscess of Upper Arm and Forearm  (ICD-682.3) 10)  Edema  (ICD-782.3) 11)  Urinary Frequency, Chronic  (ICD-788.41) 12)  Ben Loc Hyperplasia Pros w/o Ur Obst & Oth Luts  (ICD-600.20) 13)  Glossitis  (ICD-529.0) 14)  Hyperlipidemia  (ICD-272.4) 15)  Constipation, Slow Transit  (ICD-564.01) 16)  Vitamin B12 Deficiency  (ICD-266.2) 17)  Stenosis, Lumbar Spine  (ICD-724.02) 18)  Preventive Health Care  (ICD-V70.0) 19)  Disorder, Cervical Disc W/myelopathy  (ICD-722.71) 20)  Dizziness or Vertigo  (ICD-780.4) 21)  Dm W/neuro Manifestations, Type II  (ICD-250.60) 22)  Hypertension  (ICD-401.9)  Medications  Prior to Update: 1)  Amitriptyline Hcl 75 Mg  Tabs (Amitriptyline Hcl) .... At Bedtime 2)  Micardis 80 Mg Tabs (Telmisartan) .... One By Mouth Daily 3)  Cosopt   Soln (Dorzolamide-Timolol Soln) .... Two Times A Day 4)  Glipizide Xl 5 Mg Xr24h-Tab (Glipizide) .Marland Kitchen.. 1 Once Daily 5)  Kombiglyze Xr 2.12-998 Mg Xr24h-Tab (Saxagliptin-Metformin) .... One By Mouth Daily 6)  Enablex 15 Mg  Tb24 (Darifenacin Hydrobromide) .... Take 1 Tablet By Mouth Once A Day 7)  Ra Chromium Picolinate 400  Mcg Tabs (Chromium Picolinate) .Marland Kitchen.. 1 Once Daily 8)  Bayer Aspirin 325 Mg  Tabs (Aspirin) .... 1/2 Once Daily 9)  K-99 595 Mg  Caps (Potassium Gluconate) .... Once Daily 10)  Multivitamins   Tabs (Multiple Vitamin) .... Once Daily 11)  Flonase 50 Mcg/act  Susp (Fluticasone Propionate) .... As Directed 12)  Ascensia Contour Test   Strp (Glucose Blood) .... Once Daily 13)  Omega-3 1000 Mg  Caps (Omega-3 Fatty Acids) .... 6 Per Day 14)  Magnebind 400 200-1-400 Mg  Tabs (Magnesium-Calcium-Folic Acid) .... Once Daily 15)  Calcium 600/vitamin D 600-400 Mg-Unit Tabs (Calcium Carbonate-Vitamin D) .Marland Kitchen.. 1 Once Daily 16)  Eye-Vites   Tabs (Multiple Vitamins-Minerals) .Marland Kitchen.. 1 Two Times A Day 17)  Cyanocobalamin 1000 Mcg/ml Inj Soln (Cyanocobalamin) .Marland Kitchen.. 1 Ml Per Month 18)  Docusate Sodium 100 Mg  Caps (Docusate Sodium) .... One Three Times A Day 19)  Flomax 0.4 Mg Xr24h-Cap (Tamsulosin Hcl) .Marland Kitchen.. 1 Once Daily 20)  Vitamin C 100 Mg Tabs (Ascorbic Acid) .Marland Kitchen.. 1 Once Daily 21)  Vitamin E 400 Unit Caps (Vitamin E) .Marland Kitchen.. 1 Once Daily 22)  Melatonin 5 Mg Caps (Melatonin) .Marland Kitchen.. 1 At Bedtime 23)  Folbic 2.5-25-2 Mg Tabs (Fa-Pyridoxine-Cyancobalamin) .... One By Mouth Daily  Current Medications (verified): 1)  Amitriptyline Hcl 75 Mg  Tabs (Amitriptyline Hcl) .... At Bedtime 2)  Micardis 80 Mg Tabs (Telmisartan) .... One By Mouth Daily 3)  Cosopt   Soln (Dorzolamide-Timolol Soln) .... Two Times A Day 4)  Glipizide Xl 5 Mg Xr24h-Tab (Glipizide) .Marland Kitchen.. 1 Once Daily 5)  Kombiglyze Xr 2.12-998 Mg Xr24h-Tab (Saxagliptin-Metformin) .... One By Mouth Daily 6)  Enablex 15 Mg  Tb24 (Darifenacin Hydrobromide) .... Take 1 Tablet By Mouth Once A Day 7)  Ra Chromium Picolinate 400 Mcg Tabs (Chromium Picolinate) .Marland Kitchen.. 1 Once Daily 8)  Bayer Aspirin 325 Mg  Tabs (Aspirin) .... 1/2 Once Daily 9)  K-99 595 Mg  Caps (Potassium Gluconate) .... Once Daily 10)  Multivitamins   Tabs (Multiple Vitamin) .... Once Daily 11)  Flonase 50  Mcg/act  Susp (Fluticasone Propionate) .... As Directed 12)  Ascensia Contour Test   Strp (Glucose Blood) .... Once Daily 13)  Omega-3 1000 Mg  Caps (Omega-3 Fatty Acids) .... 6 Per Day 14)  Magnebind 400 200-1-400 Mg  Tabs (Magnesium-Calcium-Folic Acid) .... Once Daily 15)  Calcium 600/vitamin D 600-400 Mg-Unit Tabs (Calcium Carbonate-Vitamin D) .Marland Kitchen.. 1 Once Daily 16)  Eye-Vites   Tabs (Multiple Vitamins-Minerals) .Marland Kitchen.. 1 Two Times A Day 17)  Cyanocobalamin 1000 Mcg/ml Inj Soln (Cyanocobalamin) .Marland Kitchen.. 1 Ml Per Month 18)  Docusate Sodium 100 Mg  Caps (Docusate Sodium) .... One Three Times A Day 19)  Flomax 0.4 Mg Xr24h-Cap (Tamsulosin Hcl) .Marland Kitchen.. 1 Once Daily 20)  Vitamin C 100 Mg Tabs (Ascorbic Acid) .Marland Kitchen.. 1 Once Daily 21)  Vitamin E 400 Unit Caps (Vitamin E) .Marland Kitchen.. 1 Once Daily 22)  Melatonin 5 Mg Caps (Melatonin) .Marland Kitchen.. 1 At  Bedtime 23)  Biofotiamine 150 .... One By Mouth Daily  Allergies (verified): No Known Drug Allergies  Past History:  Family History: Last updated: 03/28/2007 father died from CVA at age 73 mother died at 22  Social History: Last updated: 02/01/2007 Retired Divorced Never Smoked Alcohol use-yes  Risk Factors: Exercise: no (04/24/2007)  Risk Factors: Smoking Status: never (06/17/2010) Passive Smoke Exposure: no (06/17/2010)  Past medical, surgical, family and social histories (including risk factors) reviewed, and no changes noted (except as noted below).  Past Medical History: Reviewed history from 04/01/2008 and no changes required. Hypertension Diabetes Macular Degeneration-right eye Diabetes mellitus, type II Dizziness or vertigo spinal stenosis oin lumbar spine  severe by MRI 2006 Hyperlipidemia constipation binge drinking ETOH abouse  Past Surgical History: Reviewed history from 04/24/2007 and no changes required. Appendectomy Cataract sx-left eye Hernia repair Left thumb sx-malignant skin cancer Colonoscopy lense replacement left  eye Cervical fusion Cervical laminectomy  Family History: Reviewed history from 03/28/2007 and no changes required. father died from CVA at age 105 mother died at 52  Social History: Reviewed history from 02/01/2007 and no changes required. Retired Divorced Never Smoked Alcohol use-yes  Review of Systems  The patient denies anorexia, fever, weight loss, weight gain, vision loss, decreased hearing, hoarseness, chest pain, syncope, dyspnea on exertion, peripheral edema, prolonged cough, headaches, hemoptysis, abdominal pain, melena, hematochezia, severe indigestion/heartburn, hematuria, incontinence, genital sores, muscle weakness, suspicious skin lesions, transient blindness, difficulty walking, depression, unusual weight change, abnormal bleeding, enlarged lymph nodes, angioedema, and breast masses.    Physical Exam  General:  alert, well-hydrated, and overweight-appearing.   Head:  normocephalic and male-pattern balding.   Eyes:  pupils equal and pupils round.   Ears:  R ear normal and L ear normal.   Nose:  no external erythema.   Mouth:  aphthous ulcer(s).   Neck:  No deformities, masses, or tenderness noted. Lungs:  normal respiratory effort and no dullness.   Heart:  normal rate and regular rhythm.     Impression & Recommendations:  Problem # 1:  RISK OF FALLING (ICD-V15.88) discussion on the 1000 step  Problem # 2:  HYPERLIPIDEMIA (ICD-272.4) reached goal Labs Reviewed: SGOT: 14 (06/10/2010)   SGPT: 15 (06/10/2010)  Prior 10 Yr Risk Heart Disease: 27 % (07/23/2009)   HDL:35.20 (06/10/2010), 31.50 (07/23/2009)  LDL:77 (06/10/2010), 79 (04/16/2009)  Chol:148 (06/10/2010), 163 (07/23/2009)  Trig:181.0 (06/10/2010), 135.0 (04/16/2009)  Problem # 3:  GLOSSITIS (ICD-529.0) the pt has responded to MMW and thiamine  Problem # 4:  DM W/NEURO MANIFESTATIONS, TYPE II (ICD-250.60) a1c is stable His updated medication list for this problem includes:    Micardis 80 Mg Tabs  (Telmisartan) ..... One by mouth daily    Glipizide Xl 5 Mg Xr24h-tab (Glipizide) .Marland Kitchen... 1 once daily    Kombiglyze Xr 2.12-998 Mg Xr24h-tab (Saxagliptin-metformin) ..... One by mouth daily    Bayer Aspirin 325 Mg Tabs (Aspirin) .Marland Kitchen... 1/2 once daily  Labs Reviewed: Creat: 0.8 (01/20/2010)     Last Eye Exam: normal (07/23/2009) Reviewed HgBA1c results: 7.2 (06/10/2010)  7.2 (01/20/2010)  Complete Medication List: 1)  Amitriptyline Hcl 75 Mg Tabs (Amitriptyline hcl) .... At bedtime 2)  Micardis 80 Mg Tabs (Telmisartan) .... One by mouth daily 3)  Cosopt Soln (Dorzolamide-timolol soln) .... Two times a day 4)  Glipizide Xl 5 Mg Xr24h-tab (Glipizide) .Marland Kitchen.. 1 once daily 5)  Kombiglyze Xr 2.12-998 Mg Xr24h-tab (Saxagliptin-metformin) .... One by mouth daily 6)  Enablex 15 Mg Tb24 (Darifenacin hydrobromide) .Marland KitchenMarland KitchenMarland Kitchen  Take 1 tablet by mouth once a day 7)  Ra Chromium Picolinate 400 Mcg Tabs (Chromium picolinate) .Marland Kitchen.. 1 once daily 8)  Bayer Aspirin 325 Mg Tabs (Aspirin) .... 1/2 once daily 9)  K-99 595 Mg Caps (Potassium gluconate) .... Once daily 10)  Multivitamins Tabs (Multiple vitamin) .... Once daily 11)  Flonase 50 Mcg/act Susp (Fluticasone propionate) .... As directed 12)  Ascensia Contour Test Strp (Glucose blood) .... Once daily 13)  Omega-3 1000 Mg Caps (Omega-3 fatty acids) .... 6 per day 14)  Magnebind 400 200-1-400 Mg Tabs (Magnesium-calcium-folic acid) .... Once daily 15)  Calcium 600/vitamin D 600-400 Mg-unit Tabs (Calcium carbonate-vitamin d) .Marland Kitchen.. 1 once daily 16)  Eye-vites Tabs (Multiple vitamins-minerals) .Marland Kitchen.. 1 two times a day 17)  Cyanocobalamin 1000 Mcg/ml Inj Soln (Cyanocobalamin) .Marland Kitchen.. 1 ml per month 18)  Docusate Sodium 100 Mg Caps (Docusate sodium) .... One three times a day 19)  Flomax 0.4 Mg Xr24h-cap (Tamsulosin hcl) .Marland Kitchen.. 1 once daily 20)  Vitamin C 100 Mg Tabs (Ascorbic acid) .Marland Kitchen.. 1 once daily 21)  Vitamin E 400 Unit Caps (Vitamin e) .Marland Kitchen.. 1 once daily 22)  Melatonin 5  Mg Caps (Melatonin) .Marland Kitchen.. 1 at bedtime 23)  Biofotiamine 150  .... One by mouth daily  Patient Instructions: 1)  glossitis and aphatous ulcers 2)  used Majic mouth wash ( [facility]) and thiamine and b12 3)  the pt was felt to have a b12 deficiency 4)  Please schedule a follow-up appointment in 3 months. 5)  HbgA1C prior to visit, ICD-9:250.00 Prescriptions: ENABLEX 15 MG  TB24 (DARIFENACIN HYDROBROMIDE) Take 1 tablet by mouth once a day  #90 x 3   Entered by:   Willy Eddy, LPN   Authorized by:   Stacie Glaze MD   Signed by:   Willy Eddy, LPN on 16/05/9603   Method used:   Electronically to        [facility]* (retail)             ,          Ph: [phone]       Fax: [phone]   RxID:   [id number] KOMBIGLYZE XR 2.12-998 MG XR24H-TAB (SAXAGLIPTIN-METFORMIN) one by mouth daily  #90 x 3   Entered by:   Willy Eddy, LPN   Authorized by:   Stacie Glaze MD   Signed by:   Willy Eddy, LPN on 32/44/0102   Method used:   Electronically to        [facility]* (retail)             ,          Ph: [phone]       Fax: [phone]   RxID:   [id number] MICARDIS 80 MG TABS (TELMISARTAN) one by mouth daily  #90 x 3   Entered by:   Willy Eddy, LPN   Authorized by:   Stacie Glaze MD   Signed by:   Willy Eddy, LPN on 02/06/1600   Method used:   Electronically to        [facility]* (retail)             ,          Ph: [phone]       Fax: [phone]   RxID:   [id number]    Orders Added: 1)  Est. Patient Level IV [71062]     Appended Document: Orders Update  Clinical Lists Changes  Orders: Added new Service order of Vit B12 1000 mcg (Z6109) - Signed Added new Service order of Admin of Therapeutic Inj  intramuscular or subcutaneous (60454) - Signed       Medication Administration  Injection # 1:    Medication: Vit B12 1000 mcg    Diagnosis: VITAMIN B12 DEFICIENCY (ICD-266.2)    Route: IM     Site: L deltoid    Exp Date: 03/08/2012    Lot #: [id number]    Mfr: [employer] Regent    Patient tolerated injection without complications    Given by: Willy Eddy, LPN (June 17, 2010 12:13 PM)  Orders Added: 1)  Vit B12 1000 mcg [J3420] 2)  Admin of Therapeutic Inj  intramuscular or subcutaneous [09811]

## 2010-09-16 ENCOUNTER — Other Ambulatory Visit (INDEPENDENT_AMBULATORY_CARE_PROVIDER_SITE_OTHER): Payer: Medicare Other | Admitting: Internal Medicine

## 2010-09-16 DIAGNOSIS — E538 Deficiency of other specified B group vitamins: Secondary | ICD-10-CM

## 2010-09-16 DIAGNOSIS — E119 Type 2 diabetes mellitus without complications: Secondary | ICD-10-CM

## 2010-09-16 LAB — HEMOGLOBIN A1C: Hgb A1c MFr Bld: 7.6 % — ABNORMAL HIGH (ref 4.6–6.5)

## 2010-09-16 MED ORDER — CYANOCOBALAMIN 1000 MCG/ML IJ SOLN
1000.0000 ug | Freq: Once | INTRAMUSCULAR | Status: AC
  Administered 2010-09-16: 1000 ug via INTRAMUSCULAR

## 2010-09-16 NOTE — Progress Notes (Signed)
Addended by: Melchor Amour on: 09/16/2010 11:23 AM   Modules accepted: Orders

## 2010-09-28 ENCOUNTER — Ambulatory Visit (INDEPENDENT_AMBULATORY_CARE_PROVIDER_SITE_OTHER): Payer: Medicare Other | Admitting: Internal Medicine

## 2010-09-28 ENCOUNTER — Encounter: Payer: Self-pay | Admitting: Internal Medicine

## 2010-09-28 ENCOUNTER — Ambulatory Visit (INDEPENDENT_AMBULATORY_CARE_PROVIDER_SITE_OTHER)
Admission: RE | Admit: 2010-09-28 | Discharge: 2010-09-28 | Disposition: A | Discharge status: Home / Self Care | Payer: Medicare Other | Source: Ambulatory Visit | Attending: Internal Medicine | Admitting: Internal Medicine

## 2010-09-28 ENCOUNTER — Other Ambulatory Visit: Payer: Self-pay | Admitting: Internal Medicine

## 2010-09-28 ENCOUNTER — Ambulatory Visit
Admission: RE | Admit: 2010-09-28 | Discharge: 2010-09-28 | Disposition: A | Discharge status: Home / Self Care | Payer: Medicare Other | Source: Ambulatory Visit | Attending: Internal Medicine | Admitting: Internal Medicine

## 2010-09-28 VITALS — BP 136/80 | HR 80 | Temp 98.1°F | Resp 16 | Ht 68.0 in | Wt 222.0 lb

## 2010-09-28 DIAGNOSIS — E538 Deficiency of other specified B group vitamins: Secondary | ICD-10-CM

## 2010-09-28 DIAGNOSIS — M5134 Other intervertebral disc degeneration, thoracic region: Secondary | ICD-10-CM | POA: Insufficient documentation

## 2010-09-28 DIAGNOSIS — M549 Dorsalgia, unspecified: Secondary | ICD-10-CM

## 2010-09-28 DIAGNOSIS — K140 Glossitis: Secondary | ICD-10-CM

## 2010-09-28 DIAGNOSIS — E1149 Type 2 diabetes mellitus with other diabetic neurological complication: Secondary | ICD-10-CM

## 2010-09-28 DIAGNOSIS — IMO0002 Reserved for concepts with insufficient information to code with codable children: Secondary | ICD-10-CM

## 2010-09-28 LAB — VITAMIN B12: Vitamin B-12: 671 pg/mL (ref 211–911)

## 2010-09-28 MED ORDER — CYANOCOBALAMIN 500 MCG SL SUBL: 1.0000 | SUBLINGUAL_TABLET | Freq: Every day | SUBLINGUAL | Status: AC

## 2010-09-28 NOTE — Progress Notes (Signed)
Subjective:    Patient ID: Nathan Cunningham, male    DOB: Feb 13, 1925, 77 y.o.   MRN: [id number]  HPI   the patient is an 77 year old white male who presents for followup office visit he has diabetes B12 deficiency hyperlipidemia and hypertension.. He has recurrent mouth ulcers that responded initially to the Magic mouthwash and a topical therapy as well as antibiotic.  As complicated by probable dry mouth from his medications B12 deficiency and the diabetes. Review of Systems  Constitutional: Positive for activity change, fatigue and unexpected weight change. Negative for fever.  HENT: Positive for sore throat and mouth sores. Negative for hearing loss, congestion, neck pain and postnasal drip.   Eyes: Negative for discharge, redness and visual disturbance.  Respiratory: Negative for cough, shortness of breath and wheezing.   Cardiovascular: Positive for leg swelling.  Gastrointestinal: Negative for abdominal pain, constipation and abdominal distention.  Genitourinary: Negative for urgency and frequency.  Musculoskeletal: Positive for back pain and gait problem. Negative for joint swelling and arthralgias.  Skin: Negative for color change and rash.  Neurological: Negative for weakness and light-headedness.  Hematological: Negative for adenopathy.  Psychiatric/Behavioral: Negative for behavioral problems.   Past Medical History  Diagnosis Date  . Hypertension   . Diabetes mellitus   . Hyperlipidemia   . Macular degeneration     Right eye  . Constipation   . ETOH abuse     binge drinking  . Dizziness    Past Surgical History  Procedure Date  . Appendectomy   . Cataract extraction     Left eye  . Hernia repair   . Cevical fusion   . Cervical laminectomy     reports that he has never smoked. He does not have any smokeless tobacco history on file. He reports that he drinks alcohol. He reports that he does not use illicit drugs. family history includes Heart disease in his  father and Stroke (age of onset:77) in his father.     I have reviewed this patient's past medical history surgical history social history current medication list and current allergy list and have documented appropriate changes as necessary      Objective:   Physical Exam      patient is an elderly white male who presents for followup of his diabetes hypertension B12 deficiency. He is in no apparent distress his vital signs are recorded in the chart he appears his stated age he is pale male pattern balding neck is supple without JVD or bruit pupils are equal round reactive to light and accommodation oropharynx is clear examination of the tongue reveals prominent papillae and some posterior adenopathy. The tongue is erythematous and moderately swollen.  Neck is supple without adenopathy lung fields were clear heart examination revealed a regular rate and rhythm abdomen was soft and protuberant extremity examination revealed 1+ edema he has peripheral neuropathy diabetic foot examination revealed decreased sensation to light touch and pinprick.       Assessment & Plan:   Patient has chronic glossitis of unknown etiology the increased size of his tonsillar material and papular material gives rise to some concern and I believe he should be evaluated by an ear nose and throat doctor who may consider a biopsy this may result from chronic B12 deficiency however the persistent inflammation is worrisome.  He did respond prior to antibiotics and Magic mouthwash but I am hesitant to continue this prescription long-term without evaluation.   his diabetes is slightly worse with an  A1c increasing to 7.7 he states that this is due to his inability to exercise.   we will  evaluate his back for the etiology of his pain and numbness with plain films today and a consideration for referral for interventional radiology to do epidurals for pain control if the plain film findings suggest that this is a chronic  issue. Otherwise an MRI would be the next

## 2010-09-28 NOTE — Assessment & Plan Note (Signed)
Patient has recurrent glossitis with a large papillae of his tongue this is sore and has responded in the past for Magic mouthwash however due to the chronicity of the condition we are referring to an ear nose and throat specialist for evaluation and a second opinion

## 2010-09-28 NOTE — Assessment & Plan Note (Signed)
Add b12 dots daily

## 2010-09-28 NOTE — Assessment & Plan Note (Signed)
monitoring of his CBG and today will  Order A1C

## 2010-09-29 ENCOUNTER — Telehealth: Payer: Self-pay | Admitting: *Deleted

## 2010-09-29 DIAGNOSIS — M1990 Unspecified osteoarthritis, unspecified site: Secondary | ICD-10-CM

## 2010-09-29 NOTE — Telephone Encounter (Signed)
To dr Ethelene Hal for possible injections- see xray

## 2010-09-29 NOTE — Progress Notes (Signed)
Pt informed and referral will be sent to terri

## 2010-10-20 ENCOUNTER — Ambulatory Visit: Payer: Self-pay | Admitting: Internal Medicine

## 2010-10-24 LAB — BASIC METABOLIC PANEL
CO2: 28 mEq/L (ref 19–32)
Calcium: 8.3 mg/dL — ABNORMAL LOW (ref 8.4–10.5)
Chloride: 99 mEq/L (ref 96–112)
GFR calc Af Amer: >60 mL/min (ref >60)
GFR calc Af Amer: >60 mL/min (ref >60)
GFR calc non Af Amer: >60 mL/min (ref >60)
Glucose, Bld: 178 mg/dL — ABNORMAL HIGH (ref 70–99)
Sodium: 134 mEq/L — ABNORMAL LOW (ref 135–145)
Sodium: 136 mEq/L (ref 135–145)

## 2010-10-24 LAB — CBC
HCT: 33.6 % — ABNORMAL LOW (ref 39.0–52.0)
HCT: 34.6 % — ABNORMAL LOW (ref 39.0–52.0)
Hemoglobin: 11 g/dL — ABNORMAL LOW (ref 13.0–17.0)
Hemoglobin: 11.6 g/dL — ABNORMAL LOW (ref 13.0–17.0)
MCH: 32.4 pg (ref 26.0–34.0)
MCHC: 33.7 g/dL (ref 30.0–36.0)
MCV: 96.7 fL (ref 78.0–100.0)
Platelets: 147 10*3/uL — ABNORMAL LOW (ref 150–400)
RBC: 3.39 MIL/uL — ABNORMAL LOW (ref 4.22–5.81)
RBC: 3.58 MIL/uL — ABNORMAL LOW (ref 4.22–5.81)
RDW: 14.3 % (ref 11.5–15.5)
WBC: 6.7 10*3/uL (ref 4.0–10.5)

## 2010-10-24 LAB — GLUCOSE, CAPILLARY
Glucose-Capillary: 132 mg/dL — ABNORMAL HIGH (ref 70–99)
Glucose-Capillary: 149 mg/dL — ABNORMAL HIGH (ref 70–99)
Glucose-Capillary: 170 mg/dL — ABNORMAL HIGH (ref 70–99)
Glucose-Capillary: 181 mg/dL — ABNORMAL HIGH (ref 70–99)
Glucose-Capillary: 186 mg/dL — ABNORMAL HIGH (ref 70–99)
Glucose-Capillary: 221 mg/dL — ABNORMAL HIGH (ref 70–99)
Glucose-Capillary: 279 mg/dL — ABNORMAL HIGH (ref 70–99)

## 2010-10-24 LAB — PROTIME-INR: INR: 1.59 — ABNORMAL HIGH (ref 0.00–1.49)

## 2010-10-24 LAB — APTT: aPTT: 50 seconds — ABNORMAL HIGH (ref 24–37)

## 2010-10-29 ENCOUNTER — Ambulatory Visit: Payer: Self-pay | Admitting: Internal Medicine

## 2010-11-03 ENCOUNTER — Ambulatory Visit: Payer: Self-pay | Admitting: Internal Medicine

## 2010-11-05 ENCOUNTER — Other Ambulatory Visit: Payer: Self-pay | Admitting: Internal Medicine

## 2010-11-20 ENCOUNTER — Encounter: Payer: Self-pay | Admitting: *Deleted

## 2010-11-20 ENCOUNTER — Ambulatory Visit (INDEPENDENT_AMBULATORY_CARE_PROVIDER_SITE_OTHER): Payer: Medicare Other | Admitting: *Deleted

## 2010-11-20 VITALS — BP 110/64 | HR 84 | Ht 68.0 in | Wt 220.0 lb

## 2010-11-20 DIAGNOSIS — I4892 Unspecified atrial flutter: Secondary | ICD-10-CM

## 2010-11-20 NOTE — Progress Notes (Signed)
Pt in today for EKG. Pt was at his PT appt in our building and was having fast heartbeat 98 bpm. Pt does have h/o a flutter and is not anticoagulated at this time. Pt's EKG today shows NSR with HR 84. Pt normally sees Dr. Graciela Husbands and has f/u scheduled for 12/08/10. Pt will contact our office with any problems in the meantime.

## 2010-11-23 ENCOUNTER — Ambulatory Visit: Payer: Self-pay | Admitting: Internal Medicine

## 2010-11-27 ENCOUNTER — Ambulatory Visit (INDEPENDENT_AMBULATORY_CARE_PROVIDER_SITE_OTHER): Payer: Medicare Other | Admitting: Internal Medicine

## 2010-11-27 ENCOUNTER — Encounter: Payer: Self-pay | Admitting: Internal Medicine

## 2010-11-27 VITALS — BP 130/70 | HR 76 | Temp 98.2°F | Resp 16 | Ht 68.5 in | Wt 222.0 lb

## 2010-11-27 DIAGNOSIS — Z9181 History of falling: Secondary | ICD-10-CM

## 2010-11-27 DIAGNOSIS — E538 Deficiency of other specified B group vitamins: Secondary | ICD-10-CM

## 2010-11-27 DIAGNOSIS — E1149 Type 2 diabetes mellitus with other diabetic neurological complication: Secondary | ICD-10-CM

## 2010-11-27 MED ORDER — SAXAGLIPTIN-METFORMIN ER 5-1000 MG PO TB24: 1.0000 | ORAL_TABLET | Freq: Every day | ORAL | Status: DC

## 2010-11-27 MED ORDER — CYANOCOBALAMIN 1000 MCG/ML IJ SOLN
1000.0000 ug | Freq: Once | INTRAMUSCULAR | Status: AC
  Administered 2010-11-27: 1000 ug via INTRAMUSCULAR

## 2010-11-27 NOTE — Assessment & Plan Note (Signed)
At sugars have been running in the 140-150 range by a.m. fingerstick are goal is to get the blood sugars down a little bit lower in the morning and to get the A1c down into the upper 6 range however because of the falls we must be very careful about not letting his blood sugars drop too low

## 2010-11-27 NOTE — Assessment & Plan Note (Signed)
The patient has been undergoing physical therapy and his physical therapist has taught him some gait exercises and ways to protect himself from falling and he reports no new falls

## 2010-11-27 NOTE — Progress Notes (Signed)
  Subjective:    Patient ID: Nathan Cunningham, male    DOB: August 12, 1924, 77 y.o.   MRN: [id number]  HPI Dr Clide Cliff has evaluated the HR The pt was referred to PT from Dr Ethelene Hal for injection. The pts neuropaty is stable. The PT has improved pain.  He has been seeing Dr Ethelene Hal who wants to repeat hip films      Review of Systems  Constitutional: Negative for fever and fatigue.  HENT: Negative for hearing loss, congestion, neck pain and postnasal drip.   Eyes: Negative for discharge, redness and visual disturbance.  Respiratory: Negative for cough, shortness of breath and wheezing.   Cardiovascular: Negative for leg swelling.  Gastrointestinal: Negative for abdominal pain, constipation and abdominal distention.  Genitourinary: Negative for urgency and frequency.  Musculoskeletal: Negative for joint swelling and arthralgias.  Skin: Negative for color change and rash.  Neurological: Negative for weakness and light-headedness.  Hematological: Negative for adenopathy.  Psychiatric/Behavioral: Negative for behavioral problems.       Past Medical History  Diagnosis Date  . Hypertension   . Diabetes mellitus   . Hyperlipidemia   . Macular degeneration     Right eye  . Constipation   . ETOH abuse     binge drinking  . Dizziness    Past Surgical History  Procedure Date  . Appendectomy   . Cataract extraction     Left eye  . Hernia repair   . Cevical fusion   . Cervical laminectomy     reports that he has never smoked. He does not have any smokeless tobacco history on file. He reports that he does not drink alcohol or use illicit drugs. family history includes Heart disease in his father and Stroke (age of onset:77) in his father. No Known Allergies  Objective:   Physical Exam  Constitutional: He appears well-developed and well-nourished.  HENT:  Head: Normocephalic and atraumatic.  Eyes: Conjunctivae are normal. Pupils are equal, round, and reactive to light.  Neck:  Normal range of motion. Neck supple.  Cardiovascular:       irregular  Pulmonary/Chest: Effort normal and breath sounds normal.  Abdominal: Soft. Bowel sounds are normal. He exhibits distension. There is no rebound and no guarding.  Musculoskeletal: He exhibits edema and tenderness.  Neurological: Coordination abnormal.  Skin: There is pallor.          Assessment & Plan:  The patient has been going to physical therapy in [place] next hospital he has felt that this is much more tailored to his problem and has received no benefit from this physical therapy end of the prior referrals should be noted for future referrals for him. He has had no new falls to the gait training has been effective we're going to work to control his diabetes better by increasing his medication slightly and monitoring an A1c in 2 months his blood pressure is stable and he has an appointment with his cardiologist for his atrial fibrillation

## 2010-12-08 ENCOUNTER — Ambulatory Visit (INDEPENDENT_AMBULATORY_CARE_PROVIDER_SITE_OTHER): Payer: Medicare Other | Admitting: Internal Medicine

## 2010-12-08 ENCOUNTER — Encounter: Payer: Self-pay | Admitting: Internal Medicine

## 2010-12-08 VITALS — BP 120/64 | HR 79 | Ht 69.0 in | Wt 213.0 lb

## 2010-12-08 DIAGNOSIS — I4892 Unspecified atrial flutter: Secondary | ICD-10-CM

## 2010-12-08 NOTE — Assessment & Plan Note (Signed)
No recurrent atrial arrhythmia

## 2010-12-08 NOTE — Progress Notes (Signed)
HPI  Nathan Cunningham is a 77 y.o. male Seen because of tachycardia recently had physical therapy. He is status post catheter ablation for atrial flutter which was associated with a subsequent normalization of a tachycardia induced cardiomyopathy. This was done last year.  He has no major complaints of chest pain or shortness of breath or peripheral edema. His major issues are balance and strength and falling  Past Medical History  Diagnosis Date  . Hypertension   . Diabetes mellitus   . Hyperlipidemia   . Macular degeneration     Right eye  . Constipation   . ETOH abuse     binge drinking  . Dizziness     Past Surgical History  Procedure Date  . Appendectomy   . Cataract extraction     Left eye  . Hernia repair   . Cevical fusion   . Cervical laminectomy     Current Outpatient Prescriptions  Medication Sig Dispense Refill  . amitriptyline (ELAVIL) 75 MG tablet TAKE 1 TABLET AT BEDTIME  90 tablet  2  . Ascorbic Acid (VITAMIN C) 100 MG tablet Take 100 mg by mouth daily.        . Chromium Picolinate (RA CHROMIUM PICOLINATE) 400 MCG TABS Take 1 tablet by mouth daily.        Marland Kitchen darifenacin (ENABLEX) 15 MG 24 hr tablet Take 15 mg by mouth daily.        Marland Kitchen docusate sodium (COLACE) 100 MG capsule Take 100 mg by mouth 3 (three) times daily as needed.        . dorzolamide-timolol (COSOPT) 22.3-6.8 MG/ML ophthalmic solution 1 drop 2 (two) times daily.        . fish oil-omega-3 fatty acids 1000 MG capsule Take 1 g by mouth daily. Take six daily       . GLIPIZIDE XL 5 MG 24 hr tablet TAKE 1 TABLET ONCE DAILY  60 each  11  . Magnesium-Calcium-Folic Acid (MAGNEBIND 400) 045-409-8 MG TABS Take 1 tablet by mouth daily.        . Melatonin 5 MG CAPS Take 1 each by mouth daily.        . Multiple Vitamin (MULTIVITAMIN) tablet Take 1 tablet by mouth daily.        . Potassium Gluconate (K-99) 595 MG CAPS Take 1 each by mouth daily.        . Saxagliptin-Metformin (KOMBIGLYZE XR) 12-998 MG  TB24 Take 1 each by mouth daily.  90 tablet  3  . Tamsulosin HCl (FLOMAX) 0.4 MG CAPS TAKE 1 CAPSULE ONCE DAILY  90 capsule  2  . telmisartan (MICARDIS) 80 MG tablet Take 80 mg by mouth daily.        . vitamin E (VITAMIN E) 400 UNIT capsule Take 400 Units by mouth daily.        . fluticasone (FLONASE) 50 MCG/ACT nasal spray 1 spray by Nasal route daily.        . Multiple Vitamins-Minerals (EYE-VITES) TABS Take 1 tablet by mouth 2 (two) times daily.          No Known Allergies  Review of Systems negative except from HPI and PMH  Physical Exam Well developed and well nourished in no acute distress HENT normal E scleral and icterus clear Neck Supple JVP flat; carotids brisk and full Clear to ausculation Regular rate and rhythm, no murmurs gallops or rub Soft with active bowel sounds No clubbing cyanosis ; trace edema Alert and oriented, Able  to stand from a chair but has a wide-based gait Skin Warm and Dry  ECG sinus rhythm with PACs Heart rate 80 intervals 0.17/0.08/0.36 Low-voltage  Assessment and  Plan

## 2010-12-25 NOTE — H&P (Signed)
NAME:  Nathan, Cunningham NO.:  [id number]   MEDICAL RECORD NO.:  [id number]          PATIENT TYPE:  OIB   LOCATION:  2899                         FACILITY:  [facility]   PHYSICIAN:  Guadelupe Sabin, M.D.DATE OF BIRTH:  [birth year]   DATE OF ADMISSION:  12/25/2004  DATE OF DISCHARGE:                                HISTORY & PHYSICAL   REASON FOR ADMISSION:  This was a planned outpatient elective admission of  this 77 year old diabetic admitted for cataract implant surgery of the left  eye.   PRESENT ILLNESS:  This patient has been followed in my office since Dec 31, 1992 when he was referred by Dr. Herbert Moors, [facility] optometrist, for  evaluation of central vision loss in the right eye. Examination at that time  revealed an acuity of less than 20/400 in the right eye and age-related  macular degeneration was noted. Over the ensuing years, the patient has  maintained his good vision in the left eye but has been essentially blind  with a less than 20/400 a visual acuity in the right eye. Approximately in  the year 1999, the patient began to have a slight elevation of intraocular  pressure and later was felt to have glaucoma, open-angle type, with a  superior para central depression. The patient was placed on glaucoma  medications and has been maintained on these. Current, these medications  include Cosopt ophthalmic solution 1 drop twice a day to both eyes. By May  of 2006, the patient complained of marked glare and glare testing revealed  less than 20/400. Street lights looked like clusters of light instead of  being single. The patient also noticed a deterioration in vision to 20/100.  He, therefore, elected to proceed with cataract implant surgery of the left  eye hoping for visual improvement.   PAST MEDICAL HISTORY:  The patient is in stable general health under the  care of Dr. Darryll Capers.   DISCHARGE MEDICATIONS:  The patient takes multiple medications  including  amitriptyline, aspirin, Avapro, Cosopt, doxazosin, Flonase, glipizide,  meclizine, metformin, Ocuvite Lutein, and other multiple vitamins including  chromium, calcium, fish oil, magnesium, potassium, vitamin C, and vitamin E.   REVIEW OF SYSTEMS:  The patient is in stable general health and has no  cardiorespiratory complaints. The patient was given oral discussion and  printed information concerning the operative procedure and its possible  complications.   CONSENT:  He signed an informed consent and arrangements were made for his  outpatient admission at this time.   PHYSICAL EXAMINATION:  VITAL SIGNS: As recorded on admission, blood pressure  113/62, temperature 97.0, heart rate 74, respirations 20. GENERAL  APPEARANCE: The patient is a pleasant, 77 year old white male in no acute  distress.  EYES: Visual acuity as noted at less than 20/400 right eye, 20/100 left eye,  with best correction. Applanation tonometry on medication, 15 mm each eye.  Slit lamp examination shows the eyes to be white and clear with nuclear  cataract formation in both eyes and additional posterior subcapsular changes  in the left eye. Dilated detailed fundus examination reveals an old  exudative diskiform scar in the right eye from age-related macular  degeneration. The left eye has  minimal macular degeneration without  exudation, hemorrhage, or choroidal neovascular membrane. The optic nerves  are sharply outlined, of good color with a disk cup ratio of 0.4 right eye,  0.6 left eye.  CHEST/LUNGS: Clear to percussion and auscultation.  HEART: Normal sinus rhythm, no cardiomegaly. No murmurs.  ABDOMEN: Negative.  EXTREMITIES: Negative.   ADMISSION DIAGNOSIS:  Senile cataract both eyes, age-related macular  degeneration right eye, glaucoma both eyes.   SURGICAL PLAN:  Cataract implant surgery left eye now, right eye later as  needed.      HNJ/MEDQ  D:  12/25/2004  T:  12/25/2004  Job:   [id number]   cc:   Stacie Glaze, M.D. [facility]

## 2010-12-25 NOTE — Discharge Summary (Signed)
[place]  Patient:    Nathan Cunningham, Nathan Cunningham Visit Number: [id number] MRN: [id number]          Service Type: MED Location: [phone] Attending Physician:  Nathan Cunningham Dictated by:   Nathan Cunningham, N.P. Admit Date:  04/14/2001 Discharge Date: 04/17/2001                             Discharge Summary  ADMISSION PHYSICIAN:  Dr. Darryll Cunningham.  DISCHARGE PHYSICIAN:  Dr. Illene Cunningham.  CARDIOLOGIST:  N[facility] Cunningham Cardiology.  ADMISSION DIAGNOSES: 1. Rhabdomyolysis. 2. Hypertension. 3. Diabetes mellitus. 4. Neurology.  DISCHARGE DIAGNOSES: 1. Improved rhabdomyolysis state. 2. Hypertension, achieved borderline control.  We will add Lopressor to his    home medications. 3. Diabetes mellitus.  His capillary blood glucoses have been in the range of    126-193 during hospitalization, with a hemoglobin A1C of 6.7. 4. Neurology has remained stable.  No signs or symptoms of delirium tremens. 5. Cardiac.  A Cardiolite stress test was requested based on the elevated    CK-MBs.  Results as follows.  LABORATORY DATA:  Alcohol level 148.  CK-MB ranged from 10 on admission to 6.2 at discharge.  CK was as high as 471.  It is now 205 for discharge.  His troponins went from 0.03-0.01 and remained negative.  BMET as of April 17, 2001:  Sodium 140, potassium 3.4, chloride 107, CO2 27, glucose 194, BUN 5, creatinine 0.7, calcium 8.4.  CBC on April 14, 2001, was completely normal except for white blood cell count at 12.5.  Chest x-ray showed no acute cardiopulmonary disease.  Borderline heart.  Cardiolite on April 17, 2001, was essentially negative.  The images are pending.  HISTORY OF PRESENT ILLNESS:  Nathan Cunningham was admitted on April 14, 2001, after stating that he had fallen at home and could not get up.  He stated that he had been drinking for four days and consumed three fifths of scotch and bourbon.  He stated that  he drank when he was bored.  He also stated that he did have a positive history for depression but was currently not on any medication.  HOSPITAL COURSE:  On assessment he demonstrated rhabdomyolysis with mildly elevated CKs, and the plan was to rehydrate him and monitor him over the next several days, during which time his CKs remained elevated, although his troponin levels remained negative for a cardiac event.  The decision was made to have him set up for a Cardiolite exam, and this was performed today with the above results.  The patient understands that he is to follow up with Dr. Lovell Cunningham on discharge.  DISCHARGE PHYSICAL EXAMINATION:  GENERAL:  The patient is awake, alert, and oriented.  He is offering no complaints.  He is walking about his room without any ataxia.  He is comfortable, in no apparent distress.  His a.m. CBG is 169.  VITAL SIGNS:  Vital signs are 98.2, 70, 20, 153/87, and he is 96% on room air.  HEART:  Regular rate and rhythm, with distant sounds.  LUNGS:  No wheezing and no crackles.  ABDOMEN:  Obese.  Positive bowel sounds.  Nontender.  EXTREMITIES:  No edema.  NEUROLOGIC:  He is demonstrating no signs of alcohol withdrawal.  DIET:  The patient is being sent home on a low-fat, low-salt diet.  MEDICATIONS:  The only new medication to his regimen will be Lopressor.  Will start him on the low dose of 25 mg 1 pill p.o. b.i.d. Dictated by:   Nathan Cunningham, N.P. Attending Physician:  Nathan Cunningham DD:  04/17/01 TD:  04/17/01 Job: [id number]

## 2010-12-25 NOTE — Op Note (Signed)
NAME:  Nathan Cunningham, Nathan Cunningham NO.:  [id number]   MEDICAL RECORD NO.:  [id number]          PATIENT TYPE:  OIB   LOCATION:  2899                         FACILITY:  [facility]   PHYSICIAN:  Guadelupe Sabin, M.D.DATE OF BIRTH:  March 03, 1925   DATE OF PROCEDURE:  12/25/2004  DATE OF DISCHARGE:                                 OPERATIVE REPORT   PREOPERATIVE DIAGNOSIS:  Senile nuclear cataract left eye, chronic open  angle glaucoma left eye.   POSTOPERATIVE DIAGNOSIS:  Senile nuclear cataract left eye, chronic open  angle glaucoma left eye.   OPERATION:  Planned extracapsular cataract extraction - phacoemulsification  insertion of posterior chamber intraocular lens implant, left eye.   SURGEON:  Stormy Fabian, M.D.   ASSISTANT:  Nurse.   ANESTHESIA:  Local 4% Xylocaine, 0.75% Marcaine retrobulbar block with  Wydase added, topical tetracaine, and intraocular Xylocaine.  Anesthesia  standby required in this diabetic patient.   OPERATIVE PROCEDURE:  After the patient was prepped and draped, a lid  speculum was inserted in the left eye. The eye was turned downward and a  superior rectus traction suture placed. Shias tonometry was recorded at 12  scale units with a 5.5 grams weight. A peritomy was performed adjacent to  the limbus from the 11 to 1 o'clock position. The corneoscleral junction was  cleaned and a corneoscleral groove made with a 45 degree Superblade. The  anterior chamber was then entered with a 2.5 mm diamond keratome at the 12  o'clock position, and a 15 degrees blade at the 2:30 position. Using a bent  26 gauge needle on an Ocucoat syringe, a circular capsulorrhexis was begun  and then completed with the Grabow forceps. Hydrodissection and  hydrodelineation were performed using 1% Xylocaine. The 30 degrees  phacoemulsification tip was then inserted with slow controlled  emulsification of the lens nucleus. Total ultrasonic time 1 minute, 21  seconds, average  power level 16%, total amount of fluid used 80 mL.  Following removal of the nucleus, the residual cortex was aspirated with the  irrigation-aspiration tip. The posterior capsule appeared intact with a  brilliant red fundus reflex. It was, therefore, elected to insert an  Allergan medical optics SI 40 NB silicone three-piece posterior chamber  intraocular lens implant, diopter strength +19.00. This was inserted with  the McDonald forceps into the anterior chamber and then centered into the  capsular bag using the [facility] lens rotator. The lens appeared to be well-  centered. The Ocucoat and Provisc which had been used intermittently  throughout the procedures were aspirated and replaced with balanced salt  solution and Miochol ophthalmic solution. The operative incisions appeared  to be self-sealing. It was, however, elected to place a single 10-0  interrupted nylon suture across the 12 o'clock incision to ensure closure  and to prevent endophthalmitis. Maxitrol ointment was instilled in the  conjunctival cul-de-sac and a light patch and protector shield applied.  Duration of procedure was 45 minutes. The patient tolerated the procedure  well in general, and left the operating room for the recovery room in good  condition.    HNJ/MEDQ  D:  12/25/2004  T:  12/25/2004  Job:  [id number]

## 2010-12-28 ENCOUNTER — Ambulatory Visit (INDEPENDENT_AMBULATORY_CARE_PROVIDER_SITE_OTHER): Payer: Medicare Other | Admitting: Internal Medicine

## 2010-12-28 DIAGNOSIS — E538 Deficiency of other specified B group vitamins: Secondary | ICD-10-CM

## 2010-12-28 MED ORDER — CYANOCOBALAMIN 1000 MCG/ML IJ SOLN
1000.0000 ug | Freq: Once | INTRAMUSCULAR | Status: AC
  Administered 2010-12-28: 1000 ug via INTRAMUSCULAR

## 2011-01-14 ENCOUNTER — Other Ambulatory Visit: Payer: Self-pay | Admitting: Internal Medicine

## 2011-02-01 ENCOUNTER — Encounter: Payer: Self-pay | Admitting: Internal Medicine

## 2011-02-01 ENCOUNTER — Ambulatory Visit (INDEPENDENT_AMBULATORY_CARE_PROVIDER_SITE_OTHER): Payer: Medicare Other | Admitting: Internal Medicine

## 2011-02-01 VITALS — BP 130/80 | HR 72 | Temp 98.2°F | Resp 16 | Ht 69.0 in | Wt 210.0 lb

## 2011-02-01 DIAGNOSIS — D518 Other vitamin B12 deficiency anemias: Secondary | ICD-10-CM

## 2011-02-01 DIAGNOSIS — R42 Dizziness and giddiness: Secondary | ICD-10-CM

## 2011-02-01 DIAGNOSIS — R635 Abnormal weight gain: Secondary | ICD-10-CM

## 2011-02-01 DIAGNOSIS — N401 Enlarged prostate with lower urinary tract symptoms: Secondary | ICD-10-CM

## 2011-02-01 DIAGNOSIS — E1149 Type 2 diabetes mellitus with other diabetic neurological complication: Secondary | ICD-10-CM

## 2011-02-01 DIAGNOSIS — M5000 Cervical disc disorder with myelopathy, unspecified cervical region: Secondary | ICD-10-CM

## 2011-02-01 DIAGNOSIS — D519 Vitamin B12 deficiency anemia, unspecified: Secondary | ICD-10-CM

## 2011-02-01 LAB — CBC WITH DIFFERENTIAL/PLATELET
Platelets: 142 10*3/uL — ABNORMAL LOW (ref 150.0–400.0)
RBC: 3.97 Mil/uL — ABNORMAL LOW (ref 4.22–5.81)
WBC: 7.6 10*3/uL (ref 4.5–10.5)

## 2011-02-01 MED ORDER — CYANOCOBALAMIN 1000 MCG/ML IJ SOLN
1000.0000 ug | Freq: Once | INTRAMUSCULAR | Status: AC
  Administered 2011-02-01: 1000 ug via INTRAMUSCULAR

## 2011-02-01 MED ORDER — TAMSULOSIN HCL 0.4 MG PO CAPS: 0.8000 mg | ORAL_CAPSULE | ORAL | Status: DC

## 2011-02-01 NOTE — Progress Notes (Signed)
  Subjective:    Patient ID: Nathan Cunningham, male    DOB: 20-Aug-1924, 77 y.o.   MRN: [id number]  HPI  Fall risk, in PT and OT workshop unsing a fall prevention workshop Balance is still the issue Has seen Dr Ethelene Hal for pain control Know thoracic disc pain pattern DM is fairly well controlled  Review of Systems  Constitutional: Positive for activity change and appetite change. Negative for fever and fatigue.  HENT: Negative for hearing loss, congestion, neck pain and postnasal drip.   Eyes: Negative for discharge, redness and visual disturbance.  Respiratory: Negative for cough, shortness of breath and wheezing.   Cardiovascular: Negative for leg swelling.  Gastrointestinal: Negative for abdominal pain, constipation and abdominal distention.  Genitourinary: Negative for urgency and frequency.  Musculoskeletal: Positive for back pain, joint swelling and gait problem. Negative for arthralgias.  Skin: Negative for color change and rash.  Neurological: Positive for dizziness and weakness. Negative for light-headedness.  Hematological: Negative for adenopathy.  Psychiatric/Behavioral: Negative for behavioral problems.       Past Medical History  Diagnosis Date  . Hypertension   . Diabetes mellitus   . Hyperlipidemia   . Macular degeneration     Right eye  . Constipation   . ETOH abuse     binge drinking  . Dizziness    Past Surgical History  Procedure Date  . Appendectomy   . Cataract extraction     Left eye  . Hernia repair   . Cevical fusion   . Cervical laminectomy     reports that he has never smoked. He does not have any smokeless tobacco history on file. He reports that he does not drink alcohol or use illicit drugs. family history includes Heart disease in his father and Stroke (age of onset:77) in his father. No Known Allergies  Objective:   Physical Exam  Constitutional: He is oriented to person, place, and time. He appears well-developed and  well-nourished.  HENT:  Head: Normocephalic and atraumatic.  Eyes: Conjunctivae are normal. Pupils are equal, round, and reactive to light.  Neck: Normal range of motion. Neck supple.  Cardiovascular: Normal rate and regular rhythm.   Pulmonary/Chest: Effort normal and breath sounds normal.  Abdominal: Soft. Bowel sounds are normal.  Musculoskeletal:       Wide gate  Neurological: He is alert and oriented to person, place, and time.       neuropathy  Skin: Skin is warm and dry.          Assessment & Plan:  Disucssion of pain therapy with Dr Ethelene Hal... The injection did help the thoracic pain and the new pain across the abdomin. The lower abdomin pain persisted but has lessened. The pt has  Chronic constipation issues controlled with stool softners/ colance Needs to add metamucil

## 2011-02-01 NOTE — Patient Instructions (Addendum)
Fiber Content of Foods Drinking plenty of fluids and consuming foods high in fiber can help with constipation. See the list below for the fiber content of some common foods. Food Group Food Item Amount Grams of Fiber  Starches and Grains Cheerios 1 Cup 3   Kellogg's Corn Flakes 1 Cup 0.7   Rice Krispies 1  Cup 0.3   Quaker Oat Life Cereal  Cup 2.1   Oatmeal, instant (cooked)  Cup 2   Kellogg's Frosted Mini Wheats 1 Cup 5.1   Rice, brown, long-grain (cooked) 1 Cup 3.5   Rice, white, long-grain (cooked) 1 Cup 0.6   Macaroni, cooked, enriched 1 Cup 2.5  Legumes Beans, baked, canned, plain or vegetarian  Cup 5.2   Beans, kidney, canned  Cup 6.8   Beans, pinto, dried (cooked)  Cup 7.7   Beans, pinto, canned  Cup 5.5  Breads and Crackers Graham crackers, plain or honey 2 squares 0.7   Saltine crackers 3 0.3   Pretzels, plain, salted 10 pieces 1.8   Bread, whole wheat 1 slice 1.9   Bread, white 1 slice 0.7   Bread, raisin 1 slice 1.2   Bagel, plain 3 oz 2   Tortilla, flour 1 oz 0.9   Tortilla, corn 1 small 1.5   Bun, hamburger or hotdog 1 small 0.9  Fruits Apple, raw with skin 1 medium 4.4   Applesauce, sweetened  Cup 1.5   Banana  medium 1.5   Grapes 10 grapes 0.4   Orange 1 small 2.3   Raisin 1.5 oz 1.6   Melon 1 Cup 1.4  Vegetables Green beans, canned  Cup 1.3   Carrots (cooked)  Cup 2.3   Broccoli (cooked)  Cup 2.8   Peas, frozen (cooked)  Cup 4.4   Potatoes, mashed  Cup 1.6   Lettuce 1 Cup 0.5   Corn, canned  Cup 1.6   Tomato  Cup 1.1  Information taken from the [employer], 2008. Document Released: 12/12/2006  [facility] Patient Information 2011 [place], [place].   Benefiber or metamucil every day  For constipation senicot is better that ducolax

## 2011-02-15 ENCOUNTER — Telehealth: Payer: Self-pay | Admitting: *Deleted

## 2011-02-15 DIAGNOSIS — N401 Enlarged prostate with lower urinary tract symptoms: Secondary | ICD-10-CM

## 2011-02-15 MED ORDER — TAMSULOSIN HCL 0.4 MG PO CAPS: 0.8000 mg | ORAL_CAPSULE | ORAL | Status: DC

## 2011-02-15 MED ORDER — SAXAGLIPTIN-METFORMIN ER 5-1000 MG PO TB24: 1.0000 | ORAL_TABLET | Freq: Every day | ORAL | Status: DC

## 2011-02-15 MED ORDER — GLIPIZIDE ER 5 MG PO TB24: 5.0000 mg | ORAL_TABLET | Freq: Every day | ORAL | Status: DC

## 2011-02-15 NOTE — Telephone Encounter (Signed)
1. Pt states glipizide was written for #60 instead of #90.  Requesting corrected rx sent to [facility].  2. Tamsulosin dosage was increased at last ov from 1 a day to b.i.d, needs #180 requesting new rx sent to [facility]

## 2011-05-05 ENCOUNTER — Ambulatory Visit (INDEPENDENT_AMBULATORY_CARE_PROVIDER_SITE_OTHER): Payer: Medicare Other | Admitting: Internal Medicine

## 2011-05-05 ENCOUNTER — Encounter: Payer: Self-pay | Admitting: Internal Medicine

## 2011-05-05 DIAGNOSIS — Z23 Encounter for immunization: Secondary | ICD-10-CM

## 2011-05-05 DIAGNOSIS — D518 Other vitamin B12 deficiency anemias: Secondary | ICD-10-CM

## 2011-05-05 DIAGNOSIS — N401 Enlarged prostate with lower urinary tract symptoms: Secondary | ICD-10-CM

## 2011-05-05 DIAGNOSIS — IMO0002 Reserved for concepts with insufficient information to code with codable children: Secondary | ICD-10-CM

## 2011-05-05 DIAGNOSIS — E119 Type 2 diabetes mellitus without complications: Secondary | ICD-10-CM

## 2011-05-05 DIAGNOSIS — M792 Neuralgia and neuritis, unspecified: Secondary | ICD-10-CM

## 2011-05-05 DIAGNOSIS — D519 Vitamin B12 deficiency anemia, unspecified: Secondary | ICD-10-CM

## 2011-05-05 DIAGNOSIS — R1031 Right lower quadrant pain: Secondary | ICD-10-CM

## 2011-05-05 LAB — BASIC METABOLIC PANEL
BUN: 14 mg/dL (ref 6–23)
Creatinine, Ser: 0.7 mg/dL (ref 0.4–1.5)
GFR: 112.37 mL/min (ref >60.00)
Potassium: 4.9 mEq/L (ref 3.5–5.1)

## 2011-05-05 LAB — HEMOGLOBIN A1C: Hgb A1c MFr Bld: 7.3 % — ABNORMAL HIGH (ref 4.6–6.5)

## 2011-05-05 MED ORDER — TAMSULOSIN HCL 0.4 MG PO CAPS: 0.8000 mg | ORAL_CAPSULE | ORAL | Status: DC

## 2011-05-05 MED ORDER — CYANOCOBALAMIN 1000 MCG/ML IJ SOLN
1000.0000 ug | INTRAMUSCULAR | Status: DC
  Administered 2011-05-05: 1000 ug via INTRAMUSCULAR

## 2011-05-05 MED ORDER — AMITRIPTYLINE HCL 75 MG PO TABS: 75.0000 mg | ORAL_TABLET | Freq: Every day | ORAL | Status: DC

## 2011-05-05 NOTE — Progress Notes (Signed)
Subjective:    Patient ID: Nathan Cunningham, male    DOB: [birth year], 77 y.o.   MRN: [id number]  HPI Balance is still a problem but has not fallen He doubled the tamulosin... But ran out of medications and needs 180 mg Also needs refills of his current medications persistent flank pain seen by Dr Ethelene Hal and sent him to Dr Ethelene Hal. Had a shot directed by MRI per pt that did not help  Hx of childhood right inguinal hernia ( scar for both appendectomy and hernia) He is concerned that the pain in his right groin may not the hip or back pain but maybe every 2 some sort of surgical scarring that occurred from the appendectomy and hernia repair he had as a child. He denies any fever or chills  Review of Systems  Constitutional: Negative for fever and fatigue.  HENT: Negative for hearing loss, congestion, neck pain and postnasal drip.   Eyes: Negative for discharge, redness and visual disturbance.  Respiratory: Negative for cough, shortness of breath and wheezing.   Cardiovascular: Negative for leg swelling.  Gastrointestinal: Negative for abdominal pain, constipation and abdominal distention.  Genitourinary: Positive for frequency and flank pain. Negative for urgency.  Musculoskeletal: Positive for back pain, joint swelling and gait problem. Negative for arthralgias.  Skin: Negative for color change and rash.  Neurological: Negative for weakness and light-headedness.  Hematological: Negative for adenopathy.  Psychiatric/Behavioral: Negative for behavioral problems.   Past Medical History  Diagnosis Date  . Hypertension   . Diabetes mellitus   . Hyperlipidemia   . Macular degeneration     Right eye  . Constipation   . ETOH abuse     binge drinking  . Dizziness    Past Surgical History  Procedure Date  . Appendectomy   . Cataract extraction     Left eye  . Hernia repair   . Cevical fusion   . Cervical laminectomy     reports that he has never smoked. He does not have any  smokeless tobacco history on file. He reports that he does not drink alcohol or use illicit drugs. family history includes Heart disease in his father and Stroke (age of onset:77) in his father. No Known Allergies     Objective:   Physical Exam  Nursing note and vitals reviewed. Constitutional: He is oriented to person, place, and time. He appears well-developed and well-nourished.  HENT:  Head: Atraumatic.  Eyes: Conjunctivae are normal. Pupils are equal, round, and reactive to light.  Neck: Normal range of motion. Neck supple.  Cardiovascular: Normal rate and regular rhythm.   Pulmonary/Chest: Effort normal and breath sounds normal.  Abdominal: Bowel sounds are normal. He exhibits distension. There is tenderness.  Musculoskeletal: He exhibits edema.  Neurological: He is alert and oriented to person, place, and time.          Assessment & Plan:  We will obtain a CT of the abdomen and pelvis to look at the right lower quadrant to see if this persistent pain is due to perhaps an incarcerated hernia that is difficult to palpate due to his obesity or scar tissue from childhood surgery.  All attempts to treat this as lumbar back or hip pain have failed the orthopedist yet at the end of his therapeutic options for this pain as well as he has had no response to physical therapy  Will monitor hemoglobin A1c today a CBC with differential because of his history of diabetes and anemia Will monitor  basic metabolic panel before he receives a diet CT scan in order for the CT scan has been placed

## 2011-05-05 NOTE — Patient Instructions (Addendum)
Medication were sent to [facility] You get a call in the next few days to set up a CT of her abdomen and pelvis to look at the place where you had the hernia surgery and appendectomy as a child seat that the etiology of pain

## 2011-05-06 LAB — CBC WITH DIFFERENTIAL/PLATELET
Hemoglobin: 13.1 g/dL (ref 13.0–17.0)
Lymphocytes Relative: 38 % (ref 12.0–46.0)
Monocytes Relative: 19 % — ABNORMAL HIGH (ref 3.0–12.0)
Platelets: 143 10*3/uL — ABNORMAL LOW (ref 150.0–400.0)
RDW: 13.9 % (ref 11.5–14.6)

## 2011-05-07 LAB — DIFFERENTIAL
Basophils Relative: 0
Eosinophils Absolute: 0.1
Eosinophils Relative: 1
Monocytes Absolute: 0.5
Monocytes Relative: 8
Neutrophils Relative %: 54

## 2011-05-07 LAB — CBC
HCT: 34.3 — ABNORMAL LOW
Hemoglobin: 11.4 — ABNORMAL LOW
MCHC: 33.2
MCV: 97
Platelets: 167
RBC: 3.54 — ABNORMAL LOW
RDW: 14.3
WBC: 6.5

## 2011-05-07 LAB — COMPREHENSIVE METABOLIC PANEL WITH GFR
ALT: 20
AST: 22
Albumin: 2.8 — ABNORMAL LOW
Alkaline Phosphatase: 45
BUN: 20
CO2: 21
Calcium: 8 — ABNORMAL LOW
Chloride: 100
Creatinine, Ser: 1.11
GFR calc Af Amer: >60
GFR calc non Af Amer: >60
Glucose, Bld: 141 — ABNORMAL HIGH
Potassium: 4.3
Sodium: 133 — ABNORMAL LOW
Total Bilirubin: 0.8
Total Protein: 5.4 — ABNORMAL LOW

## 2011-05-07 LAB — URINE MICROSCOPIC-ADD ON

## 2011-05-07 LAB — URINALYSIS, ROUTINE W REFLEX MICROSCOPIC
Bilirubin Urine: NEGATIVE
Hgb urine dipstick: NEGATIVE
Ketones, ur: 15 — AB
Protein, ur: 30 — AB
Urobilinogen, UA: 0.2

## 2011-05-07 LAB — ETHANOL: Alcohol, Ethyl (B): 133 — ABNORMAL HIGH

## 2011-05-10 ENCOUNTER — Ambulatory Visit (INDEPENDENT_AMBULATORY_CARE_PROVIDER_SITE_OTHER)
Admission: RE | Admit: 2011-05-10 | Discharge: 2011-05-10 | Disposition: A | Discharge status: Home / Self Care | Payer: Medicare Other | Source: Ambulatory Visit | Attending: Internal Medicine | Admitting: Internal Medicine

## 2011-05-10 DIAGNOSIS — R1031 Right lower quadrant pain: Secondary | ICD-10-CM

## 2011-05-10 MED ORDER — IOHEXOL 300 MG/ML  SOLN
100.0000 mL | Freq: Once | INTRAMUSCULAR | Status: AC | PRN
  Administered 2011-05-10: 100 mL via INTRAVENOUS

## 2011-05-12 ENCOUNTER — Telehealth: Payer: Self-pay | Admitting: Internal Medicine

## 2011-05-12 NOTE — Telephone Encounter (Signed)
Pt called req CT Scan results. Pls call back asap.

## 2011-05-12 NOTE — Telephone Encounter (Signed)
Per results note in chart- every is ok but there is a hernia with fat in it which could possibly be causing the pain.  Per dr Hervey Ard we could send to a surgeon and let them look at ct   And discuss with pt.  Pt states he will consider that and call us back if he decided to do that

## 2011-06-15 ENCOUNTER — Encounter: Payer: Self-pay | Admitting: Gastroenterology

## 2011-07-06 ENCOUNTER — Ambulatory Visit (INDEPENDENT_AMBULATORY_CARE_PROVIDER_SITE_OTHER): Payer: Medicare Other | Admitting: Internal Medicine

## 2011-07-06 ENCOUNTER — Other Ambulatory Visit: Payer: Self-pay | Admitting: Internal Medicine

## 2011-07-06 ENCOUNTER — Encounter: Payer: Self-pay | Admitting: Internal Medicine

## 2011-07-06 ENCOUNTER — Ambulatory Visit (INDEPENDENT_AMBULATORY_CARE_PROVIDER_SITE_OTHER)
Admission: RE | Admit: 2011-07-06 | Discharge: 2011-07-06 | Disposition: A | Discharge status: Home / Self Care | Payer: Medicare Other | Source: Ambulatory Visit | Attending: Internal Medicine | Admitting: Internal Medicine

## 2011-07-06 VITALS — BP 136/82 | HR 96 | Temp 98.8°F | Resp 20 | Ht 69.0 in | Wt 225.0 lb

## 2011-07-06 DIAGNOSIS — R42 Dizziness and giddiness: Secondary | ICD-10-CM

## 2011-07-06 DIAGNOSIS — R079 Chest pain, unspecified: Secondary | ICD-10-CM

## 2011-07-06 DIAGNOSIS — M549 Dorsalgia, unspecified: Secondary | ICD-10-CM

## 2011-07-06 DIAGNOSIS — D518 Other vitamin B12 deficiency anemias: Secondary | ICD-10-CM

## 2011-07-06 DIAGNOSIS — Z9181 History of falling: Secondary | ICD-10-CM

## 2011-07-06 DIAGNOSIS — W07XXXA Fall from chair, initial encounter: Secondary | ICD-10-CM

## 2011-07-06 DIAGNOSIS — I4892 Unspecified atrial flutter: Secondary | ICD-10-CM

## 2011-07-06 DIAGNOSIS — D519 Vitamin B12 deficiency anemia, unspecified: Secondary | ICD-10-CM

## 2011-07-06 DIAGNOSIS — E1149 Type 2 diabetes mellitus with other diabetic neurological complication: Secondary | ICD-10-CM

## 2011-07-06 DIAGNOSIS — I10 Essential (primary) hypertension: Secondary | ICD-10-CM

## 2011-07-06 MED ORDER — CYANOCOBALAMIN 1000 MCG/ML IJ SOLN
1000.0000 ug | INTRAMUSCULAR | Status: DC
  Administered 2011-07-06: 1000 ug via INTRAMUSCULAR

## 2011-07-06 NOTE — Patient Instructions (Signed)
Go to [facility] [place]  [facility] our [facility] care office and get the chest x-ray and the x-ray of your ribs and back today

## 2011-07-06 NOTE — Progress Notes (Signed)
Subjective:    Patient ID: Nathan Cunningham, male    DOB: 30-Mar-1925, 77 y.o.   MRN: [id number]  HPI Two weeks ago fell and has persistent thoracic back pain and sternal pain Has been sleeping in a chair since the accident. If he tries to sleep in the bed his wife has to pull him up and he experiences pain in back and chest. He has not had xrays, He did not go to the ER.  Pt has persistent right lower quadrant pain post hernia repair MRI   Review of Systems  Constitutional: Negative for fever and fatigue.  HENT: Negative for hearing loss, congestion, neck pain and postnasal drip.   Eyes: Negative for discharge, redness and visual disturbance.  Respiratory: Negative for cough, shortness of breath and wheezing.   Cardiovascular: Negative for leg swelling.  Gastrointestinal: Negative for abdominal pain, constipation and abdominal distention.  Genitourinary: Negative for urgency and frequency.  Musculoskeletal: Negative for joint swelling and arthralgias.  Skin: Negative for color change and rash.  Neurological: Negative for weakness and light-headedness.  Hematological: Negative for adenopathy.  Psychiatric/Behavioral: Negative for behavioral problems.   Past Medical History  Diagnosis Date  . Hypertension   . Diabetes mellitus   . Hyperlipidemia   . Macular degeneration     Right eye  . Constipation   . ETOH abuse     binge drinking  . Dizziness     History   Social History  . Marital Status: Divorced    Spouse Name: N/A    Number of Children: N/A  . Years of Education: N/A   Occupational History  . retired    Social History Main Topics  . Smoking status: Never Smoker   . Smokeless tobacco: Not on file  . Alcohol Use: No  . Drug Use: No  . Sexually Active: Yes   Other Topics Concern  . Not on file   Social History Narrative  . No narrative on file    Past Surgical History  Procedure Date  . Appendectomy   . Cataract extraction     Left eye  .  Hernia repair   . Cevical fusion   . Cervical laminectomy     Family History  Problem Relation Age of Onset  . Heart disease Father   . Stroke Father 84    No Known Allergies  Current Outpatient Prescriptions on File Prior to Visit  Medication Sig Dispense Refill  . amitriptyline (ELAVIL) 75 MG tablet Take 1 tablet (75 mg total) by mouth at bedtime.  90 tablet  2  . Chromium Picolinate (RA CHROMIUM PICOLINATE) 400 MCG TABS Take 1 tablet by mouth daily.        Marland Kitchen docusate sodium (COLACE) 100 MG capsule Take 100 mg by mouth 3 (three) times daily as needed.        . dorzolamide-timolol (COSOPT) 22.3-6.8 MG/ML ophthalmic solution 1 drop 2 (two) times daily.        . fish oil-omega-3 fatty acids 1000 MG capsule Take by mouth. Take six daily      . glipiZIDE (GLIPIZIDE XL) 5 MG 24 hr tablet Take 1 tablet (5 mg total) by mouth daily.  90 tablet  3  . Magnesium-Calcium-Folic Acid (MAGNEBIND 400) 409-811-9 MG TABS Take 1 tablet by mouth daily.        . Melatonin 5 MG CAPS Take 1 each by mouth daily.        Marland Kitchen MICARDIS 80 MG tablet TAKE 1 TABLET  DAILY  90 tablet  2  . Multiple Vitamins-Minerals (EYE-VITES) TABS Take 1 tablet by mouth 2 (two) times daily.        . Potassium Gluconate (K-99) 595 MG CAPS Take 1 each by mouth daily.        . Saxagliptin-Metformin (KOMBIGLYZE XR) 12-998 MG TB24 Take 1 each by mouth daily.  90 tablet  3  . Tamsulosin HCl (FLOMAX) 0.4 MG CAPS Take 2 capsules (0.8 mg total) by mouth daily after supper.  180 capsule  3   No current facility-administered medications on file prior to visit.    BP 136/82  Pulse 96  Temp 98.8 F (37.1 C)  Resp 20  Ht 5\' 9"  (1.753 m)  Wt 225 lb (102.059 kg)  BMI 33.23 kg/m2       Objective:   Physical Exam  Nursing note and vitals reviewed. Constitutional: He appears well-developed and well-nourished.  HENT:  Head: Normocephalic and atraumatic.  Eyes: Conjunctivae are normal. Pupils are equal, round, and reactive to light.    Neck: Normal range of motion. Neck supple.  Cardiovascular: Normal rate and regular rhythm.   Pulmonary/Chest: Effort normal and breath sounds normal.  Abdominal: Soft. Bowel sounds are normal. He exhibits distension. There is tenderness.  Musculoskeletal: He exhibits edema and tenderness.          Assessment & Plan:  Severe low back pain with history of prior surgery.  MRI of the back  Referral to neurosurgery Diabetes appears to be stable Hypertension stable with mild elevation secondary to pain

## 2011-07-09 ENCOUNTER — Telehealth: Payer: Self-pay | Admitting: Internal Medicine

## 2011-07-09 NOTE — Telephone Encounter (Signed)
Per dr Lovell Sheehan- no    fx or penumothorax- pt informed

## 2011-07-09 NOTE — Telephone Encounter (Signed)
Requesting xray results. Dr Lovell Sheehan told patient on Tuesday that he would be getting a call back on yesterday or today. Please advise. Thanks.

## 2011-09-09 ENCOUNTER — Ambulatory Visit: Payer: Medicare Other | Admitting: Internal Medicine

## 2011-09-17 ENCOUNTER — Ambulatory Visit (INDEPENDENT_AMBULATORY_CARE_PROVIDER_SITE_OTHER): Payer: Medicare Other | Admitting: Internal Medicine

## 2011-09-17 ENCOUNTER — Encounter: Payer: Self-pay | Admitting: Internal Medicine

## 2011-09-17 VITALS — BP 130/70 | HR 72 | Temp 98.0°F | Resp 16 | Ht 69.0 in | Wt 218.0 lb

## 2011-09-17 DIAGNOSIS — D519 Vitamin B12 deficiency anemia, unspecified: Secondary | ICD-10-CM

## 2011-09-17 DIAGNOSIS — E1149 Type 2 diabetes mellitus with other diabetic neurological complication: Secondary | ICD-10-CM

## 2011-09-17 DIAGNOSIS — E1169 Type 2 diabetes mellitus with other specified complication: Secondary | ICD-10-CM

## 2011-09-17 DIAGNOSIS — E1165 Type 2 diabetes mellitus with hyperglycemia: Secondary | ICD-10-CM

## 2011-09-17 DIAGNOSIS — D518 Other vitamin B12 deficiency anemias: Secondary | ICD-10-CM

## 2011-09-17 DIAGNOSIS — E1142 Type 2 diabetes mellitus with diabetic polyneuropathy: Secondary | ICD-10-CM

## 2011-09-17 LAB — BASIC METABOLIC PANEL
Chloride: 100 mEq/L (ref 96–112)
Potassium: 4.4 mEq/L (ref 3.5–5.1)
Sodium: 137 mEq/L (ref 135–145)

## 2011-09-17 LAB — HEMOGLOBIN A1C: Hgb A1c MFr Bld: 8.1 % — ABNORMAL HIGH (ref 4.6–6.5)

## 2011-09-17 LAB — VITAMIN B12

## 2011-09-17 MED ORDER — CYANOCOBALAMIN 1000 MCG/ML IJ SOLN
1000.0000 ug | Freq: Once | INTRAMUSCULAR | Status: AC
  Administered 2011-09-17: 1000 ug via INTRAMUSCULAR

## 2011-09-17 NOTE — Progress Notes (Signed)
Subjective:    Patient ID: Nathan Cunningham, male    DOB: 05/14/1925, 78 y.o.   MRN: [id number]  HPI Follow up DM and neuropathy. Now falls with the use of the walker Very sedentary Edema in legs stopped when he resumed sleeping in a bed   Review of Systems  Constitutional: Negative for fever and fatigue.  HENT: Negative for hearing loss, congestion, neck pain and postnasal drip.   Eyes: Negative for discharge, redness and visual disturbance.  Respiratory: Negative for cough, shortness of breath and wheezing.   Cardiovascular: Negative for leg swelling.  Gastrointestinal: Negative for abdominal pain, constipation and abdominal distention.  Genitourinary: Negative for urgency and frequency.  Musculoskeletal: Negative for joint swelling and arthralgias.  Skin: Negative for color change and rash.  Neurological: Negative for weakness and light-headedness.  Hematological: Negative for adenopathy.  Psychiatric/Behavioral: Negative for behavioral problems.   Past Medical History  Diagnosis Date  . Hypertension   . Diabetes mellitus   . Hyperlipidemia   . Macular degeneration     Right eye  . Constipation   . ETOH abuse     binge drinking  . Dizziness     History   Social History  . Marital Status: Divorced    Spouse Name: N/A    Number of Children: N/A  . Years of Education: N/A   Occupational History  . retired    Social History Main Topics  . Smoking status: Never Smoker   . Smokeless tobacco: Not on file  . Alcohol Use: No  . Drug Use: No  . Sexually Active: Yes   Other Topics Concern  . Not on file   Social History Narrative  . No narrative on file    Past Surgical History  Procedure Date  . Appendectomy   . Cataract extraction     Left eye  . Hernia repair   . Cevical fusion   . Cervical laminectomy     Family History  Problem Relation Age of Onset  . Heart disease Father   . Stroke Father 86    No Known Allergies  Current Outpatient  Prescriptions on File Prior to Visit  Medication Sig Dispense Refill  . amitriptyline (ELAVIL) 75 MG tablet Take 1 tablet (75 mg total) by mouth at bedtime.  90 tablet  2  . Chromium Picolinate (RA CHROMIUM PICOLINATE) 400 MCG TABS Take 1 tablet by mouth daily.        Marland Kitchen docusate sodium (COLACE) 100 MG capsule Take 100 mg by mouth 3 (three) times daily as needed.        . dorzolamide-timolol (COSOPT) 22.3-6.8 MG/ML ophthalmic solution 1 drop 2 (two) times daily.        . fish oil-omega-3 fatty acids 1000 MG capsule Take by mouth. Take six daily      . glipiZIDE (GLIPIZIDE XL) 5 MG 24 hr tablet Take 1 tablet (5 mg total) by mouth daily.  90 tablet  3  . Magnesium-Calcium-Folic Acid (MAGNEBIND 400) 782-956-2 MG TABS Take 1 tablet by mouth daily.        . Melatonin 5 MG CAPS Take 1 each by mouth daily.        Marland Kitchen MICARDIS 80 MG tablet TAKE 1 TABLET DAILY  90 tablet  2  . Multiple Vitamins-Minerals (EYE-VITES) TABS Take 1 tablet by mouth 2 (two) times daily.        . Potassium Gluconate (K-99) 595 MG CAPS Take 1 each by mouth daily.        Marland Kitchen  Saxagliptin-Metformin (KOMBIGLYZE XR) 12-998 MG TB24 Take 1 each by mouth daily.  90 tablet  3  . Tamsulosin HCl (FLOMAX) 0.4 MG CAPS Take 2 capsules (0.8 mg total) by mouth daily after supper.  180 capsule  3   Current Facility-Administered Medications on File Prior to Visit  Medication Dose Route Frequency Provider Last Rate Last Dose  . cyanocobalamin ((VITAMIN B-12)) injection 1,000 mcg  1,000 mcg Intramuscular Q30 days Carrie Mew, MD   1,000 mcg at 07/06/11 1148    BP 130/70  Pulse 72  Temp 98 F (36.7 C)  Resp 16  Ht 5\' 9"  (1.753 m)  Wt 218 lb (98.884 kg)  BMI 32.19 kg/m2       Objective:   Physical Exam  Constitutional: He appears well-developed and well-nourished.  HENT:  Head: Normocephalic and atraumatic.  Eyes: Conjunctivae are normal. Pupils are equal, round, and reactive to light.  Neck: Normal range of motion. Neck supple.    Cardiovascular: Normal rate and regular rhythm.   Pulmonary/Chest: Effort normal and breath sounds normal.  Abdominal: Soft. Bowel sounds are normal.    Compression stocking helped edema      Assessment & Plan:  DM with neuropathy monitor b12 levels and CBC monitor  A1C Continue exercize program  1000 steps a day with walker to prevent fall.

## 2011-09-17 NOTE — Patient Instructions (Signed)
The patient is instructed to continue all medications as prescribed. Schedule followup with check out clerk upon leaving the clinic  

## 2011-12-01 ENCOUNTER — Emergency Department: Payer: Self-pay | Admitting: Emergency Medicine

## 2011-12-01 LAB — CBC
HCT: 36.1 % — ABNORMAL LOW (ref 40.0–52.0)
HGB: 12 g/dL — ABNORMAL LOW (ref 13.0–18.0)
MCV: 93 fL (ref 80–100)
Platelet: 132 10*3/uL — ABNORMAL LOW (ref 150–440)
RBC: 3.88 10*6/uL — ABNORMAL LOW (ref 4.40–5.90)
RDW: 15.5 % — ABNORMAL HIGH (ref 11.5–14.5)

## 2011-12-01 LAB — COMPREHENSIVE METABOLIC PANEL
Albumin: 3 g/dL — ABNORMAL LOW (ref 3.4–5.0)
Alkaline Phosphatase: 64 U/L (ref 50–136)
Anion Gap: 6 — ABNORMAL LOW (ref 7–16)
Bilirubin,Total: 0.8 mg/dL (ref 0.2–1.0)
Chloride: 99 mmol/L (ref 98–107)
EGFR (African American): >60
Glucose: 167 mg/dL — ABNORMAL HIGH (ref 65–99)
Potassium: 4.7 mmol/L (ref 3.5–5.1)
SGPT (ALT): 19 U/L

## 2011-12-01 LAB — PROTIME-INR
INR: 1.1
Prothrombin Time: 14.1 secs (ref 11.5–14.7)

## 2011-12-07 LAB — CULTURE, BLOOD (SINGLE)

## 2011-12-15 ENCOUNTER — Ambulatory Visit: Payer: Medicare Other | Admitting: Internal Medicine

## 2012-02-14 ENCOUNTER — Ambulatory Visit (INDEPENDENT_AMBULATORY_CARE_PROVIDER_SITE_OTHER): Payer: Medicare Other | Admitting: Internal Medicine

## 2012-02-14 ENCOUNTER — Encounter: Payer: Self-pay | Admitting: Internal Medicine

## 2012-02-14 VITALS — BP 140/80 | HR 80 | Temp 98.2°F | Resp 18 | Ht 69.0 in | Wt 216.0 lb

## 2012-02-14 DIAGNOSIS — R635 Abnormal weight gain: Secondary | ICD-10-CM

## 2012-02-14 DIAGNOSIS — M48061 Spinal stenosis, lumbar region without neurogenic claudication: Secondary | ICD-10-CM

## 2012-02-14 DIAGNOSIS — D518 Other vitamin B12 deficiency anemias: Secondary | ICD-10-CM

## 2012-02-14 DIAGNOSIS — Z9181 History of falling: Secondary | ICD-10-CM

## 2012-02-14 DIAGNOSIS — D519 Vitamin B12 deficiency anemia, unspecified: Secondary | ICD-10-CM

## 2012-02-14 DIAGNOSIS — G4730 Sleep apnea, unspecified: Secondary | ICD-10-CM

## 2012-02-14 DIAGNOSIS — I10 Essential (primary) hypertension: Secondary | ICD-10-CM

## 2012-02-14 MED ORDER — CYANOCOBALAMIN 1000 MCG/ML IJ SOLN
1000.0000 ug | INTRAMUSCULAR | Status: AC
  Administered 2012-02-14: 1000 ug via INTRAMUSCULAR

## 2012-02-14 NOTE — Progress Notes (Signed)
Subjective:    Patient ID: Nathan Cunningham, male    DOB: 10/31/1924, 78 y.o.   MRN: [id number]  HPI Patient is an 78 year old white male who is followed by this office for diabetes hypertension peripheral neuropathy and frequent falls.  Recently had a fall in a parking lot that resulted in a hospitalization and a brief stay in a outpatient rehabilitation program.  He returns home with significant persistent risk factors for fall including imbalance deconditioning and requiring a walker.  This is also complicated by his per for neuropathy from his diabetes.     Review of Systems  Constitutional: Negative for fever and fatigue.  HENT: Negative for hearing loss, congestion, neck pain and postnasal drip.   Eyes: Negative for discharge, redness and visual disturbance.  Respiratory: Negative for cough, shortness of breath and wheezing.   Cardiovascular: Negative for leg swelling.  Gastrointestinal: Negative for abdominal pain, constipation and abdominal distention.  Genitourinary: Negative for urgency and frequency.  Musculoskeletal: Positive for myalgias, back pain, joint swelling and gait problem. Negative for arthralgias.  Skin: Negative for color change and rash.  Neurological: Positive for weakness. Negative for light-headedness.       Peripheral neuropathy  Hematological: Negative for adenopathy.  Psychiatric/Behavioral: Negative for behavioral problems.   Past Medical History  Diagnosis Date  . Hypertension   . Diabetes mellitus   . Hyperlipidemia   . Macular degeneration     Right eye  . Constipation   . ETOH abuse     binge drinking  . Dizziness     History   Social History  . Marital Status: Divorced    Spouse Name: N/A    Number of Children: N/A  . Years of Education: N/A   Occupational History  . retired    Social History Main Topics  . Smoking status: Never Smoker   . Smokeless tobacco: Not on file  . Alcohol Use: No  . Drug Use: No  . Sexually  Active: Yes   Other Topics Concern  . Not on file   Social History Narrative  . No narrative on file    Past Surgical History  Procedure Date  . Appendectomy   . Cataract extraction     Left eye  . Hernia repair   . Cevical fusion   . Cervical laminectomy     Family History  Problem Relation Age of Onset  . Heart disease Father   . Stroke Father 67    No Known Allergies  Current Outpatient Prescriptions on File Prior to Visit  Medication Sig Dispense Refill  . amitriptyline (ELAVIL) 75 MG tablet Take 1 tablet (75 mg total) by mouth at bedtime.  90 tablet  2  . aspirin 325 MG tablet Take 162 mg by mouth daily.      . Chromium Picolinate (RA CHROMIUM PICOLINATE) 400 MCG TABS Take 1 tablet by mouth daily.        Marland Kitchen docusate sodium (COLACE) 100 MG capsule Take 100 mg by mouth 3 (three) times daily as needed.        . dorzolamide-timolol (COSOPT) 22.3-6.8 MG/ML ophthalmic solution 1 drop 2 (two) times daily.        . fish oil-omega-3 fatty acids 1000 MG capsule Take by mouth. Take six daily      . glipiZIDE (GLIPIZIDE XL) 5 MG 24 hr tablet Take 1 tablet (5 mg total) by mouth daily.  90 tablet  3  . Magnesium-Calcium-Folic Acid (MAGNEBIND 400) 010-272-5 MG TABS  Take 1 tablet by mouth daily.        . Melatonin 5 MG CAPS Take 1 each by mouth daily.        Marland Kitchen MICARDIS 80 MG tablet TAKE 1 TABLET DAILY  90 tablet  2  . Multiple Vitamins-Minerals (EYE-VITES) TABS Take 1 tablet by mouth 2 (two) times daily.        . Potassium Gluconate (K-99) 595 MG CAPS Take 1 each by mouth daily.        . Saxagliptin-Metformin (KOMBIGLYZE XR) 12-998 MG TB24 Take 1 each by mouth daily.  90 tablet  3  . Tamsulosin HCl (FLOMAX) 0.4 MG CAPS Take 2 capsules (0.8 mg total) by mouth daily after supper.  180 capsule  3   Current Facility-Administered Medications on File Prior to Visit  Medication Dose Route Frequency Provider Last Rate Last Dose  . cyanocobalamin ((VITAMIN B-12)) injection 1,000 mcg  1,000  mcg Intramuscular Q30 days Stacie Glaze, MD   1,000 mcg at 07/06/11 1148    BP 140/80  Pulse 80  Temp 98.2 F (36.8 C)  Resp 18  Ht 5\' 9"  (1.753 m)  Wt 216 lb (97.977 kg)  BMI 31.90 kg/m2       Objective:   Physical Exam  Constitutional: He appears well-developed and well-nourished.  HENT:  Head: Normocephalic and atraumatic.  Eyes: Conjunctivae are normal. Pupils are equal, round, and reactive to light.  Neck: Normal range of motion. Neck supple.  Cardiovascular: Normal rate and regular rhythm.   Pulmonary/Chest: Effort normal and breath sounds normal.  Abdominal: Soft. Bowel sounds are normal.          Assessment & Plan:  We have A2 referrals today one is to sleep person for a home sleep study I believe he has obstructive sleep apnea I believe that a CPAP machine Will increase quality of life and also decrease his risk by decreasing daytime somnolence.  Patient has balance issues and deconditioning the Toprol to physical therapy should help fall prevention and complications from fall or one of his #1 risk factors at this point.

## 2012-02-14 NOTE — Patient Instructions (Signed)
I would recommend sleep study Will set up a home sleep study

## 2012-02-15 ENCOUNTER — Other Ambulatory Visit: Payer: Self-pay | Admitting: *Deleted

## 2012-02-15 ENCOUNTER — Other Ambulatory Visit: Payer: Self-pay | Admitting: Internal Medicine

## 2012-02-15 DIAGNOSIS — G4730 Sleep apnea, unspecified: Secondary | ICD-10-CM

## 2012-02-16 ENCOUNTER — Telehealth: Payer: Self-pay

## 2012-02-16 NOTE — Telephone Encounter (Signed)
Called and spoke with patient. Had actually left message on his home answering machine yesterday. Explained to patient that he would need to pick up the device on the night that he decides to do the home study and return the device back to [place] the following morning. Pt stated that he has to use a walker and advised patient that he could have a family member pick up the device for him if this would be easier for him. Gave patient my direct phone line, and he stated that he would call me back once he has arranged for someone to pick up the device for him. Rhonda J Cobb

## 2012-02-18 NOTE — Telephone Encounter (Signed)
Haven't heard back from patient so called patient back. Patient stated that he hasn't decided on a date yet. Advised patient that we need to go ahead and schedule for one day next week. Pt stated that he would need to arrange transportation here and would be back in touch with me no later than tues 02/22/12. Rhonda J Cobb

## 2012-02-25 NOTE — Telephone Encounter (Signed)
Rhonda, did pt ever call you back with a date to set this up?

## 2012-02-25 NOTE — Telephone Encounter (Signed)
Pt never returned my call. Called patient today and he stated that he would try to come on Monday 02/28/12 around 4:00 pm to pick up device. I advised the patient that this would be fine and that I would have device ready for him then. Rhonda J Cobb

## 2012-03-13 ENCOUNTER — Ambulatory Visit (INDEPENDENT_AMBULATORY_CARE_PROVIDER_SITE_OTHER): Payer: Medicare Other | Admitting: Pulmonary Disease

## 2012-03-13 DIAGNOSIS — G4733 Obstructive sleep apnea (adult) (pediatric): Secondary | ICD-10-CM

## 2012-03-13 DIAGNOSIS — G4730 Sleep apnea, unspecified: Secondary | ICD-10-CM

## 2012-03-14 ENCOUNTER — Other Ambulatory Visit: Payer: Self-pay | Admitting: Internal Medicine

## 2012-03-14 DIAGNOSIS — G4733 Obstructive sleep apnea (adult) (pediatric): Secondary | ICD-10-CM

## 2012-03-15 ENCOUNTER — Other Ambulatory Visit: Payer: Self-pay | Admitting: Internal Medicine

## 2012-03-15 ENCOUNTER — Other Ambulatory Visit: Payer: Self-pay | Admitting: *Deleted

## 2012-04-12 ENCOUNTER — Encounter: Payer: Self-pay | Admitting: Pulmonary Disease

## 2012-04-12 ENCOUNTER — Ambulatory Visit (INDEPENDENT_AMBULATORY_CARE_PROVIDER_SITE_OTHER): Payer: Medicare Other | Admitting: Pulmonary Disease

## 2012-04-12 VITALS — BP 116/72 | HR 89 | Temp 97.9°F | Ht 68.0 in | Wt 209.6 lb

## 2012-04-12 DIAGNOSIS — G4730 Sleep apnea, unspecified: Secondary | ICD-10-CM

## 2012-04-12 DIAGNOSIS — G4731 Primary central sleep apnea: Secondary | ICD-10-CM

## 2012-04-12 NOTE — Patient Instructions (Addendum)
Will start on cpap to see if we can get you sleeping better and feeling more alert during the day.  Please call if any tolerance issues. Work on weight loss followup with me in 6 weeks.

## 2012-04-12 NOTE — Progress Notes (Signed)
  Subjective:    Patient ID: Nathan Cunningham, male    DOB: January 26, 1925, 78 y.o.   MRN: [id number]  HPI The patient is an 78 year old male who I've been asked to see for management of complex sleep apnea.  He recently underwent sleep testing, where he was found to have an AHI of 70 events per hour, with both central and obstructive events.  The patient has been noted to have snoring, as well as an abnormal breathing pattern during sleep by observers.  He has frequent awakenings at night, and does not feel rested in the mornings upon arising.  He falls asleep easily with periods of inactivity, and can occasionally have sleepiness with driving.  He states his weight is neutral of the last few years, and his Epworth score today is 10.  Sleep Questionnaire: What time do you typically go to bed?( Between what hours) 11-midnight How long does it take you to fall asleep? varies How many times during the night do you wake up? 5 What time do you get out of bed to start your day? 0630 Do you drive or operate heavy machinery in your occupation? No How much has your weight changed (up or down) over the past two years? (In pounds) Have you ever had a sleep study before? No Yes If yes, location of study? HOME SLEEP STUDY DONE If yes, date of study? 02-28-12 Do you currently use CPAP? No Do you wear oxygen at any time? No     Review of Systems  Constitutional: Negative for fever and unexpected weight change.  HENT: Positive for congestion. Negative for ear pain, nosebleeds, sore throat, rhinorrhea, sneezing, trouble swallowing, dental problem, postnasal drip and sinus pressure.   Eyes: Negative for redness and itching.  Respiratory: Negative for cough, chest tightness, shortness of breath and wheezing.   Cardiovascular: Positive for leg swelling. Negative for palpitations.  Gastrointestinal: Negative for nausea and vomiting.  Genitourinary: Negative for dysuria.  Musculoskeletal: Positive for joint swelling.    Skin: Negative for rash.  Neurological: Negative for headaches.  Hematological: Bruises/bleeds easily.  Psychiatric/Behavioral: Negative for dysphoric mood. The patient is not nervous/anxious.        Objective:   Physical Exam Constitutional:  Overweight male, no acute distress  HENT:  Nares patent without discharge but narrowed.   Oropharynx without exudate, palate and uvula are mildly elongated   Eyes:  Perrla, eomi, no scleral icterus  Neck:  No JVD, no TMG  Cardiovascular:  Normal rate, regular rhythm, no rubs or gallops.  No murmurs        Intact distal pulses but decreased.  Pulmonary :  Normal breath sounds, no stridor or respiratory distress   No rales, rhonchi, or wheezing  Abdominal:  Soft, nondistended, bowel sounds present.  No tenderness noted.   Musculoskeletal:  minimal lower extremity edema noted.  Lymph Nodes:  No cervical lymphadenopathy noted  Skin:  No cyanosis noted  Neurologic: appears sleepy, but appropriate, moves all 4 extremities without obvious deficit.         Assessment & Plan:

## 2012-04-12 NOTE — Assessment & Plan Note (Signed)
The patient has severe complex sleep apnea by his recent home sleep test, and is clearly symptomatic at night and also during the day.  I had a long discussion with him about sleep apnea, including its impact on his quality of life and cardiovascular health.  His best treatment option at this time is CPAP, coupled with modest weight loss.  Because he has complex apnea, we have to be careful that he doesn't have pressure induced central apneas.  We will try to make CPAP adjustments as an outpatient with an auto device, but may need a formal titration in the sleep center if we are not able to improve his symptoms.  I have also encouraged him to work aggressively on weight loss.

## 2012-05-03 ENCOUNTER — Ambulatory Visit: Payer: Medicare Other | Admitting: Internal Medicine

## 2012-05-15 ENCOUNTER — Ambulatory Visit (INDEPENDENT_AMBULATORY_CARE_PROVIDER_SITE_OTHER): Payer: Medicare Other | Admitting: Internal Medicine

## 2012-05-15 ENCOUNTER — Encounter: Payer: Self-pay | Admitting: Internal Medicine

## 2012-05-15 VITALS — BP 130/80 | HR 72 | Temp 98.6°F | Resp 16 | Ht 68.0 in | Wt 209.0 lb

## 2012-05-15 DIAGNOSIS — E1165 Type 2 diabetes mellitus with hyperglycemia: Secondary | ICD-10-CM

## 2012-05-15 DIAGNOSIS — N401 Enlarged prostate with lower urinary tract symptoms: Secondary | ICD-10-CM

## 2012-05-15 DIAGNOSIS — E1169 Type 2 diabetes mellitus with other specified complication: Secondary | ICD-10-CM

## 2012-05-15 DIAGNOSIS — I10 Essential (primary) hypertension: Secondary | ICD-10-CM

## 2012-05-15 DIAGNOSIS — Z23 Encounter for immunization: Secondary | ICD-10-CM

## 2012-05-15 DIAGNOSIS — D519 Vitamin B12 deficiency anemia, unspecified: Secondary | ICD-10-CM

## 2012-05-15 DIAGNOSIS — D518 Other vitamin B12 deficiency anemias: Secondary | ICD-10-CM

## 2012-05-15 DIAGNOSIS — N139 Obstructive and reflux uropathy, unspecified: Secondary | ICD-10-CM

## 2012-05-15 DIAGNOSIS — N138 Other obstructive and reflux uropathy: Secondary | ICD-10-CM

## 2012-05-15 LAB — BASIC METABOLIC PANEL
Calcium: 9.1 mg/dL (ref 8.4–10.5)
Creatinine, Ser: 0.6 mg/dL (ref 0.4–1.5)
GFR: 128.67 mL/min (ref >60.00)
Sodium: 135 mEq/L (ref 135–145)

## 2012-05-15 LAB — B12 AND FOLATE PANEL: Vitamin B-12: >1500 pg/mL — ABNORMAL HIGH (ref 211–911)

## 2012-05-15 LAB — HEMOGLOBIN A1C: Hgb A1c MFr Bld: 7.2 % — ABNORMAL HIGH (ref 4.6–6.5)

## 2012-05-15 MED ORDER — CYANOCOBALAMIN 1000 MCG/ML IJ SOLN
1000.0000 ug | INTRAMUSCULAR | Status: DC
  Administered 2012-05-15: 1000 ug via INTRAMUSCULAR

## 2012-05-15 NOTE — Patient Instructions (Signed)
The patient is instructed to continue all medications as prescribed. Schedule followup with check out clerk upon leaving the clinic  

## 2012-05-15 NOTE — Progress Notes (Signed)
Subjective:    Patient ID: Nathan Cunningham, male    DOB: 1925-02-03, 78 y.o.   MRN: [id number]  HPI Has been on c-pap and is sleeping better at night He was diagnosed with complex sleep apnea Peripheral edema has improved Less fatigue monitoring A1C    Review of Systems  Constitutional: Positive for fatigue. Negative for fever.  HENT: Negative for congestion, neck pain and postnasal drip.   Eyes: Negative for discharge, redness and visual disturbance.  Respiratory: Negative for cough, shortness of breath and wheezing.   Cardiovascular: Negative for leg swelling.  Gastrointestinal: Negative for abdominal pain, constipation and abdominal distention.  Genitourinary: Negative for urgency and frequency.  Musculoskeletal: Positive for back pain, arthralgias and gait problem. Negative for joint swelling.  Skin: Negative for color change and rash.  Neurological: Negative for weakness and light-headedness.  Hematological: Negative for adenopathy.  Psychiatric/Behavioral: Positive for disturbed wake/sleep cycle and decreased concentration. Negative for behavioral problems.       Past Medical History  Diagnosis Date  . Hypertension   . Diabetes mellitus   . Hyperlipidemia   . Macular degeneration     Right eye  . Constipation   . ETOH abuse     binge drinking  . Dizziness     History   Social History  . Marital Status: Divorced    Spouse Name: N/A    Number of Children: N/A  . Years of Education: N/A   Occupational History  . retired    Social History Main Topics  . Smoking status: Never Smoker   . Smokeless tobacco: Never Used  . Alcohol Use: 1.0 oz/week    2 drink(s) per week  . Drug Use: No  . Sexually Active: Yes   Other Topics Concern  . Not on file   Social History Narrative  . No narrative on file    Past Surgical History  Procedure Date  . Appendectomy   . Cataract extraction     Left eye  . Hernia repair   . Cevical fusion   . Cervical  laminectomy     Family History  Problem Relation Age of Onset  . Heart disease Father   . Stroke Father 59    No Known Allergies  Current Outpatient Prescriptions on File Prior to Visit  Medication Sig Dispense Refill  . amitriptyline (ELAVIL) 75 MG tablet Take 1 tablet (75 mg total) by mouth at bedtime.  90 tablet  2  . aspirin 325 MG tablet Take 162 mg by mouth daily.      . Chromium Picolinate (RA CHROMIUM PICOLINATE) 400 MCG TABS Take 1 tablet by mouth daily.        Marland Kitchen docusate sodium (COLACE) 100 MG capsule Take 100 mg by mouth 3 (three) times daily as needed.        . dorzolamide-timolol (COSOPT) 22.3-6.8 MG/ML ophthalmic solution 1 drop 2 (two) times daily.        . fish oil-omega-3 fatty acids 1000 MG capsule Take by mouth. Take six daily      . glipiZIDE (GLIPIZIDE XL) 5 MG 24 hr tablet Take 1 tablet (5 mg total) by mouth daily.  90 tablet  3  . Magnesium-Calcium-Folic Acid (MAGNEBIND 400) 409-811-9 MG TABS Take 1 tablet by mouth daily.        . Melatonin 5 MG CAPS Take 1 each by mouth daily.        Marland Kitchen MICARDIS 80 MG tablet TAKE 1 TABLET DAILY  90  tablet  2  . Multiple Vitamins-Minerals (EYE-VITES) TABS Take 1 tablet by mouth 2 (two) times daily.        . Potassium Gluconate (K-99) 595 MG CAPS Take 1 each by mouth daily.        . Saxagliptin-Metformin (KOMBIGLYZE XR) 12-998 MG TB24 Take 1 each by mouth daily.  90 tablet  3  . Tamsulosin HCl (FLOMAX) 0.4 MG CAPS Take 2 capsules (0.8 mg total) by mouth daily after supper.  180 capsule  3   Current Facility-Administered Medications on File Prior to Visit  Medication Dose Route Frequency Provider Last Rate Last Dose  . cyanocobalamin ((VITAMIN B-12)) injection 1,000 mcg  1,000 mcg Intramuscular Q30 days Stacie Glaze, MD   1,000 mcg at 07/06/11 1148    BP 130/80  Pulse 72  Temp 98.6 F (37 C)  Resp 16  Ht 5\' 8"  (1.727 m)  Wt 209 lb (94.802 kg)  BMI 31.78 kg/m2    Objective:   Physical Exam  Vitals  reviewed. Constitutional: He is oriented to person, place, and time.       obese  HENT:  Head: Normocephalic and atraumatic.  Eyes: Conjunctivae normal are normal. Pupils are equal, round, and reactive to light.  Neck: Normal range of motion. Neck supple.  Cardiovascular: Regular rhythm.   Pulmonary/Chest:       central obesity  Abdominal: Bowel sounds are normal.  Musculoskeletal: He exhibits edema and tenderness.  Neurological: He is alert and oriented to person, place, and time.  Psychiatric: He has a normal mood and affect. His behavior is normal.          Assessment & Plan:  The pt has marked improvement with the cpap Measure A1c and bmet Stable HTN Dicussed gluten free diet

## 2012-05-24 ENCOUNTER — Encounter: Payer: Self-pay | Admitting: Pulmonary Disease

## 2012-05-24 ENCOUNTER — Ambulatory Visit (INDEPENDENT_AMBULATORY_CARE_PROVIDER_SITE_OTHER): Payer: Medicare Other | Admitting: Pulmonary Disease

## 2012-05-24 VITALS — BP 110/60 | HR 77 | Ht 68.0 in | Wt 207.8 lb

## 2012-05-24 DIAGNOSIS — G4731 Primary central sleep apnea: Secondary | ICD-10-CM

## 2012-05-24 DIAGNOSIS — G4739 Other sleep apnea: Secondary | ICD-10-CM

## 2012-05-24 DIAGNOSIS — G4730 Sleep apnea, unspecified: Secondary | ICD-10-CM

## 2012-05-24 NOTE — Progress Notes (Signed)
  Subjective:    Patient ID: Nathan Cunningham, male    DOB: 07/13/1925, 78 y.o.   MRN: [id number]  HPI Patient comes in today for followup of his severe complex sleep apnea.  He was started on CPAP at a moderate pressure level last visit, and has done well with the device.  His download shows excellent compliance, and a significant reduction in his AHI.  He has no significant mask leaks.  The patient feels that he is sleeping better, and has seen some improvement in his daytime alertness.  He is having no issues with his mask fit other than mild excoriation over the bridge of his nose.   Review of Systems  Constitutional: Negative for fever and unexpected weight change.  HENT: Negative for ear pain, nosebleeds, congestion, sore throat, rhinorrhea, sneezing, trouble swallowing, dental problem, postnasal drip and sinus pressure.   Eyes: Negative for redness and itching.  Respiratory: Negative for cough, chest tightness, shortness of breath and wheezing.   Cardiovascular: Negative for palpitations and leg swelling.  Gastrointestinal: Negative for nausea and vomiting.  Genitourinary: Negative for dysuria.  Musculoskeletal: Negative for joint swelling.  Skin: Negative for rash.  Neurological: Negative for headaches.  Hematological: Bruises/bleeds easily.  Psychiatric/Behavioral: Positive for dysphoric mood. The patient is nervous/anxious.        Objective:   Physical Exam Overweight male in no acute distress Nose without purulence or discharge noted No skin breakdown or pressure necrosis from the CPAP mask.  He has one erythematous area over the bridge of his nose. Lower extremities without significant edema, no cyanosis Awake, but appears mildly sleepy, moves all 4 extremities.       Assessment & Plan:

## 2012-05-24 NOTE — Patient Instructions (Addendum)
Will put your machine on auto for the next 2 weeks, and will let you know your optimal pressure once I receive the download. Work on weight loss If doing well, followup with me in 6mos.

## 2012-05-24 NOTE — Assessment & Plan Note (Signed)
The patient is doing much better with CPAP, and is having no tolerance issues.  He has seen improvement in his symptoms.  At this point, we need to optimize his pressure on the automatic setting.  I will call and let him know the results once the download is available.

## 2012-06-01 ENCOUNTER — Encounter: Payer: Self-pay | Admitting: Pulmonary Disease

## 2012-06-13 ENCOUNTER — Ambulatory Visit (INDEPENDENT_AMBULATORY_CARE_PROVIDER_SITE_OTHER): Payer: Medicare Other | Admitting: Internal Medicine

## 2012-06-13 ENCOUNTER — Ambulatory Visit: Payer: Medicare Other | Admitting: Internal Medicine

## 2012-06-13 DIAGNOSIS — D519 Vitamin B12 deficiency anemia, unspecified: Secondary | ICD-10-CM

## 2012-06-13 DIAGNOSIS — D518 Other vitamin B12 deficiency anemias: Secondary | ICD-10-CM

## 2012-06-13 MED ORDER — CYANOCOBALAMIN 1000 MCG/ML IJ SOLN
1000.0000 ug | INTRAMUSCULAR | Status: DC
  Administered 2012-06-13: 1000 ug via INTRAMUSCULAR

## 2012-06-26 ENCOUNTER — Ambulatory Visit (INDEPENDENT_AMBULATORY_CARE_PROVIDER_SITE_OTHER): Payer: Medicare Other | Admitting: Internal Medicine

## 2012-06-26 ENCOUNTER — Encounter: Payer: Self-pay | Admitting: Internal Medicine

## 2012-06-26 VITALS — BP 150/80 | HR 76 | Temp 98.0°F | Resp 18 | Ht 68.0 in | Wt 204.0 lb

## 2012-06-26 DIAGNOSIS — I10 Essential (primary) hypertension: Secondary | ICD-10-CM

## 2012-06-26 DIAGNOSIS — G629 Polyneuropathy, unspecified: Secondary | ICD-10-CM

## 2012-06-26 DIAGNOSIS — G609 Hereditary and idiopathic neuropathy, unspecified: Secondary | ICD-10-CM

## 2012-06-26 DIAGNOSIS — E785 Hyperlipidemia, unspecified: Secondary | ICD-10-CM

## 2012-06-26 NOTE — Progress Notes (Signed)
Subjective:    Patient ID: Nathan Cunningham, male    DOB: 06/19/1925, 78 y.o.   MRN: [id number]  HPI   patient is an 78 year old male who presents for a driving physical examination because of his diabetes his hypertension and his peripheral neuropathy.  Recent brings a form from the [place] driver's license by mouth for [place] to complete an assessment of his ability to function safely alert  A second part of the plan will be completed by his ophthalmologist because of his history of macular degeneration.  He has moderate peripheral neuropathy with some fall risk for which she uses a walker to aid with stability he is able to operate to the break and gas pedal with only moderate difficulty He is alert and oriented cardiovascular risk his hypertension.  He has not had any significant hypoglycemic episodes of minor there was driving. I did however suggest that he be restricted to short distances and daytime driving only  Review of Systems  Constitutional: Positive for fatigue.  HENT: Positive for hearing loss and neck stiffness.   Respiratory: Positive for apnea. Negative for chest tightness and wheezing.        As a CPAP at night  Cardiovascular: Negative for chest pain.  Musculoskeletal: Positive for back pain and gait problem.  Neurological: Positive for weakness. Negative for tremors.   Past Medical History  Diagnosis Date  . Hypertension   . Diabetes mellitus   . Hyperlipidemia   . Macular degeneration     Right eye  . Constipation   . ETOH abuse     binge drinking  . Dizziness     History   Social History  . Marital Status: Divorced    Spouse Name: N/A    Number of Children: N/A  . Years of Education: N/A   Occupational History  . retired    Social History Main Topics  . Smoking status: Never Smoker   . Smokeless tobacco: Never Used  . Alcohol Use: 1.0 oz/week    2 drink(s) per week  . Drug Use: No  . Sexually Active: Yes   Other Topics Concern  .  Not on file   Social History Narrative  . No narrative on file    Past Surgical History  Procedure Date  . Appendectomy   . Cataract extraction     Left eye  . Hernia repair   . Cevical fusion   . Cervical laminectomy     Family History  Problem Relation Age of Onset  . Heart disease Father   . Stroke Father 88    No Known Allergies  Current Outpatient Prescriptions on File Prior to Visit  Medication Sig Dispense Refill  . amitriptyline (ELAVIL) 75 MG tablet Take 1 tablet (75 mg total) by mouth at bedtime.  90 tablet  2  . aspirin 325 MG tablet Take 162 mg by mouth daily.      . Chromium Picolinate (RA CHROMIUM PICOLINATE) 400 MCG TABS Take 1 tablet by mouth daily.        Marland Kitchen docusate sodium (COLACE) 100 MG capsule Take 100 mg by mouth 3 (three) times daily as needed.        . dorzolamide-timolol (COSOPT) 22.3-6.8 MG/ML ophthalmic solution 1 drop 2 (two) times daily.        . fish oil-omega-3 fatty acids 1000 MG capsule Take by mouth. Take six daily      . glipiZIDE (GLIPIZIDE XL) 5 MG 24 hr tablet Take 1  tablet (5 mg total) by mouth daily.  90 tablet  3  . Magnesium-Calcium-Folic Acid (MAGNEBIND 400) 161-096-0 MG TABS Take 1 tablet by mouth daily.        . Melatonin 5 MG CAPS Take 1 each by mouth daily.        Marland Kitchen MICARDIS 80 MG tablet TAKE 1 TABLET DAILY  90 tablet  2  . Multiple Vitamins-Minerals (EYE-VITES) TABS Take 1 tablet by mouth 2 (two) times daily.        . Potassium Gluconate (K-99) 595 MG CAPS Take 1 each by mouth daily.        . Saxagliptin-Metformin (KOMBIGLYZE XR) 12-998 MG TB24 Take 1 each by mouth daily.  90 tablet  3   Current Facility-Administered Medications on File Prior to Visit  Medication Dose Route Frequency Provider Last Rate Last Dose  . cyanocobalamin ((VITAMIN B-12)) injection 1,000 mcg  1,000 mcg Intramuscular Q30 days Stacie Glaze, MD   1,000 mcg at 07/06/11 1148    BP 150/80  Pulse 76  Temp 98 F (36.7 C)  Resp 18  Ht 5\' 8"  (1.727 m)   Wt 204 lb (92.534 kg)  BMI 31.02 kg/m2       Objective:   Physical Exam  Constitutional: He appears well-developed and well-nourished.  HENT:  Head: Normocephalic and atraumatic.  Eyes: Conjunctivae normal are normal. Pupils are equal, round, and reactive to light.  Neck: Normal range of motion. Neck supple.  Cardiovascular: Normal rate and regular rhythm.   Murmur heard. Pulmonary/Chest: Effort normal and breath sounds normal.  Abdominal: Soft. Bowel sounds are normal.  Musculoskeletal: He exhibits edema.          Assessment & Plan:  The patient driver's form assessment of peripheral neuropathy hypertension and diabetes affect on driving.  He has moderate difficulty with his peripheral neuropathy that should not have a major effect on his ability to drive short distances during the day due to his macular degeneration he will have an ophthalmologist thought that section of the driver's form but I did recommend that he use judgment and restricted the daytime short distance driving

## 2012-07-02 ENCOUNTER — Other Ambulatory Visit: Payer: Self-pay | Admitting: Pulmonary Disease

## 2012-07-02 DIAGNOSIS — G4731 Primary central sleep apnea: Secondary | ICD-10-CM

## 2012-07-02 DIAGNOSIS — G4739 Other sleep apnea: Secondary | ICD-10-CM

## 2012-07-14 ENCOUNTER — Other Ambulatory Visit: Payer: Self-pay | Admitting: *Deleted

## 2012-07-14 MED ORDER — GLIPIZIDE ER 5 MG PO TB24: 5.0000 mg | ORAL_TABLET | Freq: Every day | ORAL | Status: DC

## 2012-08-14 ENCOUNTER — Ambulatory Visit (INDEPENDENT_AMBULATORY_CARE_PROVIDER_SITE_OTHER): Payer: Medicare Other | Admitting: Internal Medicine

## 2012-08-14 ENCOUNTER — Encounter: Payer: Self-pay | Admitting: Internal Medicine

## 2012-08-14 ENCOUNTER — Telehealth: Payer: Self-pay | Admitting: *Deleted

## 2012-08-14 VITALS — BP 130/60 | HR 72 | Temp 97.9°F | Resp 18 | Ht 68.0 in | Wt 206.0 lb

## 2012-08-14 DIAGNOSIS — G4730 Sleep apnea, unspecified: Secondary | ICD-10-CM

## 2012-08-14 DIAGNOSIS — G4731 Primary central sleep apnea: Secondary | ICD-10-CM

## 2012-08-14 DIAGNOSIS — L0390 Cellulitis, unspecified: Secondary | ICD-10-CM

## 2012-08-14 DIAGNOSIS — E538 Deficiency of other specified B group vitamins: Secondary | ICD-10-CM

## 2012-08-14 DIAGNOSIS — L0291 Cutaneous abscess, unspecified: Secondary | ICD-10-CM

## 2012-08-14 DIAGNOSIS — E1149 Type 2 diabetes mellitus with other diabetic neurological complication: Secondary | ICD-10-CM

## 2012-08-14 DIAGNOSIS — I10 Essential (primary) hypertension: Secondary | ICD-10-CM

## 2012-08-14 LAB — VITAMIN B12: Vitamin B-12: 485 pg/mL (ref 211–911)

## 2012-08-14 MED ORDER — MUPIROCIN 2 % EX OINT: TOPICAL_OINTMENT | Freq: Three times a day (TID) | CUTANEOUS | Status: DC

## 2012-08-14 NOTE — Progress Notes (Signed)
Subjective:    Patient ID: Nathan Cunningham, male    DOB: 11-13-1924, 79 y.o.   MRN: [id number]  HPI Discussion of diet and DM Neuropathy HTN stable Use of CPAP cellulitis on nose   Review of Systems  Constitutional: Positive for fatigue. Negative for fever.  HENT: Negative for hearing loss, congestion, neck pain and postnasal drip.   Eyes: Negative for discharge, redness and visual disturbance.  Respiratory: Positive for shortness of breath. Negative for cough and wheezing.   Cardiovascular: Positive for leg swelling.  Gastrointestinal: Negative for abdominal pain, constipation and abdominal distention.  Genitourinary: Negative for urgency and frequency.  Musculoskeletal: Negative for joint swelling and arthralgias.  Skin: Negative for color change and rash.  Neurological: Positive for weakness and numbness. Negative for light-headedness.  Hematological: Negative for adenopathy.  Psychiatric/Behavioral: Negative for behavioral problems.   Past Medical History  Diagnosis Date  . Hypertension   . Diabetes mellitus   . Hyperlipidemia   . Macular degeneration     Right eye  . Constipation   . ETOH abuse     binge drinking  . Dizziness     History   Social History  . Marital Status: Divorced    Spouse Name: N/A    Number of Children: N/A  . Years of Education: N/A   Occupational History  . retired    Social History Main Topics  . Smoking status: Never Smoker   . Smokeless tobacco: Never Used  . Alcohol Use: 1.0 oz/week    2 drink(s) per week  . Drug Use: No  . Sexually Active: Yes   Other Topics Concern  . Not on file   Social History Narrative  . No narrative on file    Past Surgical History  Procedure Date  . Appendectomy   . Cataract extraction     Left eye  . Hernia repair   . Cevical fusion   . Cervical laminectomy     Family History  Problem Relation Age of Onset  . Heart disease Father   . Stroke Father 25    No Known  Allergies  Current Outpatient Prescriptions on File Prior to Visit  Medication Sig Dispense Refill  . amitriptyline (ELAVIL) 75 MG tablet Take 1 tablet (75 mg total) by mouth at bedtime.  90 tablet  2  . aspirin 325 MG tablet Take 162 mg by mouth daily.      . Chromium Picolinate (RA CHROMIUM PICOLINATE) 400 MCG TABS Take 1 tablet by mouth daily.        Marland Kitchen docusate sodium (COLACE) 100 MG capsule Take 100 mg by mouth 3 (three) times daily as needed.        . dorzolamide-timolol (COSOPT) 22.3-6.8 MG/ML ophthalmic solution 1 drop 2 (two) times daily.        . fish oil-omega-3 fatty acids 1000 MG capsule Take by mouth. Take six daily      . glipiZIDE (GLIPIZIDE XL) 5 MG 24 hr tablet Take 1 tablet (5 mg total) by mouth daily.  90 tablet  3  . Magnesium-Calcium-Folic Acid (MAGNEBIND 400) 409-811-9 MG TABS Take 1 tablet by mouth daily.        . Melatonin 5 MG CAPS Take 1 each by mouth daily.        Marland Kitchen MICARDIS 80 MG tablet TAKE 1 TABLET DAILY  90 tablet  2  . Multiple Vitamins-Minerals (EYE-VITES) TABS Take 1 tablet by mouth 2 (two) times daily.        Marland Kitchen  Potassium Gluconate (K-99) 595 MG CAPS Take 1 each by mouth daily.        . Saxagliptin-Metformin (KOMBIGLYZE XR) 12-998 MG TB24 Take 1 each by mouth daily.  90 tablet  3   Current Facility-Administered Medications on File Prior to Visit  Medication Dose Route Frequency Provider Last Rate Last Dose  . cyanocobalamin ((VITAMIN B-12)) injection 1,000 mcg  1,000 mcg Intramuscular Q30 days Stacie Glaze, MD   1,000 mcg at 07/06/11 1148    BP 130/60  Pulse 72  Temp 97.9 F (36.6 C)  Resp 18  Ht 5\' 8"  (1.727 m)  Wt 206 lb (93.441 kg)  BMI 31.32 kg/m2       Objective:   Physical Exam  Constitutional: He appears well-developed and well-nourished.       Obese   HENT:  Head: Normocephalic and atraumatic.  Eyes: Conjunctivae normal are normal. Pupils are equal, round, and reactive to light.  Neck: Normal range of motion. Neck supple.   Cardiovascular: Normal rate and regular rhythm.   Murmur heard. Pulmonary/Chest: Effort normal and breath sounds normal.  Abdominal: Soft. Bowel sounds are normal.  Neurological: He displays abnormal reflex. He exhibits abnormal muscle tone. Coordination abnormal.          Assessment & Plan:  Cellulitis on nose  bactroban TID DM for a1c today Sleep apnea stable Paleo diet seemed to improve neuropathy.

## 2012-08-14 NOTE — Telephone Encounter (Signed)
Done

## 2012-08-14 NOTE — Patient Instructions (Addendum)
The patient is instructed to continue all medications as prescribed. Schedule followup with check out clerk upon leaving the clinic  

## 2012-08-26 ENCOUNTER — Other Ambulatory Visit: Payer: Self-pay | Admitting: Pulmonary Disease

## 2012-08-26 DIAGNOSIS — G4731 Primary central sleep apnea: Secondary | ICD-10-CM

## 2012-11-13 ENCOUNTER — Encounter: Payer: Self-pay | Admitting: Internal Medicine

## 2012-11-13 ENCOUNTER — Ambulatory Visit (INDEPENDENT_AMBULATORY_CARE_PROVIDER_SITE_OTHER): Payer: Medicare Other | Admitting: Internal Medicine

## 2012-11-13 VITALS — BP 130/70 | HR 80 | Temp 98.3°F | Resp 18 | Ht 68.0 in | Wt 206.0 lb

## 2012-11-13 DIAGNOSIS — D518 Other vitamin B12 deficiency anemias: Secondary | ICD-10-CM

## 2012-11-13 DIAGNOSIS — E876 Hypokalemia: Secondary | ICD-10-CM

## 2012-11-13 DIAGNOSIS — Z9181 History of falling: Secondary | ICD-10-CM

## 2012-11-13 DIAGNOSIS — I4892 Unspecified atrial flutter: Secondary | ICD-10-CM

## 2012-11-13 DIAGNOSIS — I10 Essential (primary) hypertension: Secondary | ICD-10-CM

## 2012-11-13 DIAGNOSIS — E1149 Type 2 diabetes mellitus with other diabetic neurological complication: Secondary | ICD-10-CM

## 2012-11-13 DIAGNOSIS — Z0000 Encounter for general adult medical examination without abnormal findings: Secondary | ICD-10-CM

## 2012-11-13 DIAGNOSIS — D519 Vitamin B12 deficiency anemia, unspecified: Secondary | ICD-10-CM

## 2012-11-13 LAB — COMPREHENSIVE METABOLIC PANEL
ALT: 15 U/L (ref 0–53)
BUN: 16 mg/dL (ref 6–23)
CO2: 30 mEq/L (ref 19–32)
Creatinine, Ser: 0.7 mg/dL (ref 0.4–1.5)
GFR: 110.17 mL/min (ref >60.00)
Total Bilirubin: 0.7 mg/dL (ref 0.3–1.2)

## 2012-11-13 LAB — HEMOGLOBIN A1C: Hgb A1c MFr Bld: 6.6 % — ABNORMAL HIGH (ref 4.6–6.5)

## 2012-11-13 MED ORDER — CYANOCOBALAMIN 1000 MCG/ML IJ SOLN
1000.0000 ug | INTRAMUSCULAR | Status: AC
  Administered 2012-11-13: 1000 ug via INTRAMUSCULAR

## 2012-11-13 NOTE — Patient Instructions (Signed)
The patient is instructed to continue all medications as prescribed. Schedule followup with check out clerk upon leaving the clinic  

## 2012-11-13 NOTE — Progress Notes (Signed)
  Subjective:    Patient ID: Nathan Cunningham, male    DOB: [birth year], 79 y.o.   MRN: [id number]  HPI Follow up for DM, sleep apnea and AF Has not been able to exercise    Review of Systems  Constitutional: Negative for fever and fatigue.  HENT: Negative for hearing loss, congestion, neck pain and postnasal drip.   Eyes: Negative for discharge, redness and visual disturbance.  Respiratory: Negative for cough, shortness of breath and wheezing.   Cardiovascular: Negative for leg swelling.  Gastrointestinal: Negative for abdominal pain, constipation and abdominal distention.  Genitourinary: Negative for urgency and frequency.  Musculoskeletal: Negative for joint swelling and arthralgias.  Skin: Negative for color change and rash.  Neurological: Negative for weakness and light-headedness.  Hematological: Negative for adenopathy.  Psychiatric/Behavioral: Negative for behavioral problems.       Objective:   Physical Exam  Nursing note and vitals reviewed. Constitutional: He is oriented to person, place, and time. He appears well-developed and well-nourished.  HENT:  Head: Normocephalic and atraumatic.  Cardiovascular: Normal rate and regular rhythm.   Murmur heard. Abdominal: Soft. Bowel sounds are normal.  Neurological: He is alert and oriented to person, place, and time.  Skin: Skin is dry.  Psychiatric: He has a normal mood and affect. His behavior is normal.      Diabetic foot exam:  Left: Reflexes 3+   Vibratory sensation diminished  Proprioception diminished  Sharp/dull discrimination diminished  Filament test present Right: Reflexes 3+   Vibratory sensation diminished  Proprioception diminished  Sharp/dull discrimination diminished  Filament test present     Assessment & Plan:  Last A1c was 6.7 which was his best A1c in over 3 years.  Vitamin B levels were moderately low in injection was given today Mild to moderate peripheral neuropathy related to  vitamin B12 deficiency and diabetes Stable htn b12 levels low Spinal stenosis

## 2012-11-14 ENCOUNTER — Encounter: Payer: Self-pay | Admitting: Gastroenterology

## 2012-11-22 ENCOUNTER — Ambulatory Visit (INDEPENDENT_AMBULATORY_CARE_PROVIDER_SITE_OTHER): Payer: Medicare Other | Admitting: Pulmonary Disease

## 2012-11-22 ENCOUNTER — Encounter: Payer: Self-pay | Admitting: Pulmonary Disease

## 2012-11-22 VITALS — BP 104/60 | HR 84 | Temp 97.5°F | Ht 68.0 in | Wt 208.0 lb

## 2012-11-22 DIAGNOSIS — G4730 Sleep apnea, unspecified: Secondary | ICD-10-CM

## 2012-11-22 DIAGNOSIS — G4739 Other sleep apnea: Secondary | ICD-10-CM

## 2012-11-22 DIAGNOSIS — G4731 Primary central sleep apnea: Secondary | ICD-10-CM

## 2012-11-22 NOTE — Progress Notes (Signed)
  Subjective:    Patient ID: Nathan Cunningham, male    DOB: 21-Jun-1925, 79 y.o.   MRN: [id number]  HPI The patient comes in today for an issue with his CPAP.  He has known complex sleep apnea, and has been on CPAP at 17 cm of water pressure.  He had been doing extremely well, with less awakenings and more consolidated sleep.  However, more recently, he has had issues with frequent awakenings and starting to have increased sleepiness during the day.  He has been wearing CPAP compliantly, but has been having issues with mask leaks.  He has not replaced his cushions since last year.   Review of Systems  Constitutional: Negative for fever and unexpected weight change.  HENT: Negative for ear pain, nosebleeds, congestion, sore throat, rhinorrhea, sneezing, trouble swallowing, dental problem, postnasal drip and sinus pressure.   Eyes: Negative for redness and itching.  Respiratory: Negative for cough, chest tightness, shortness of breath and wheezing.   Cardiovascular: Negative for palpitations and leg swelling.  Gastrointestinal: Negative for nausea and vomiting.  Genitourinary: Negative for dysuria.  Musculoskeletal: Negative for joint swelling.  Skin: Negative for rash.  Neurological: Negative for headaches.  Hematological: Does not bruise/bleed easily.  Psychiatric/Behavioral: Negative for dysphoric mood. The patient is not nervous/anxious.        Objective:   Physical Exam Well-developed male in no acute distress Nose without purulence or discharge noted No skin breakdown or pressure necrosis from the CPAP mask Neck without lymphadenopathy or thyromegaly Lower extremities with mild edema, no cyanosis Alert and oriented, moves all 4 extremities.       Assessment & Plan:

## 2012-11-22 NOTE — Assessment & Plan Note (Signed)
The patient initially did very well with CPAP, but now is having issues with frequent awakenings and nonrestorative sleep.  It is unclear whether this has anything to do with his complex apnea, or something else entirely different.  He is having excessive mask leak, and is overdue for mask cushions.  I have asked him to replace these every 2-3 months.  I would also like to get a download from his device and see if he is having excessive leak or poor control of his AHI.

## 2012-11-22 NOTE — Patient Instructions (Addendum)
Will get you new mask cushions, and also get a download off your machine for the last 3 mos.  Will call you once I receive this to discuss.  Please schedule followup with me in 6mos, but we may change this if you have ongoing issues.

## 2012-11-23 ENCOUNTER — Other Ambulatory Visit: Payer: Self-pay | Admitting: *Deleted

## 2012-11-23 MED ORDER — TELMISARTAN 80 MG PO TABS: ORAL_TABLET | ORAL | Status: DC

## 2012-12-03 ENCOUNTER — Other Ambulatory Visit: Payer: Self-pay | Admitting: Pulmonary Disease

## 2012-12-03 ENCOUNTER — Telehealth: Payer: Self-pay | Admitting: Pulmonary Disease

## 2012-12-03 DIAGNOSIS — G4731 Primary central sleep apnea: Secondary | ICD-10-CM

## 2012-12-03 NOTE — Telephone Encounter (Signed)
Nathan Cunningham, please let pt know that I have reviewed his download, and his sleep apnea appears to be well controlled on 17cm. Let him see how things go when he gets a new mask.

## 2012-12-04 NOTE — Telephone Encounter (Signed)
Patient returning call about results.  

## 2012-12-04 NOTE — Telephone Encounter (Signed)
LMOM x 1 

## 2012-12-04 NOTE — Telephone Encounter (Signed)
Spoke with pt and notified of recs per [facility] He verbalized understanding and denied any questions

## 2012-12-05 ENCOUNTER — Ambulatory Visit (INDEPENDENT_AMBULATORY_CARE_PROVIDER_SITE_OTHER): Payer: Medicare Other | Admitting: Gastroenterology

## 2012-12-05 ENCOUNTER — Encounter: Payer: Self-pay | Admitting: Gastroenterology

## 2012-12-05 VITALS — BP 120/60 | HR 80 | Ht 66.14 in | Wt 211.0 lb

## 2012-12-05 DIAGNOSIS — K5900 Constipation, unspecified: Secondary | ICD-10-CM

## 2012-12-05 DIAGNOSIS — Z86010 Personal history of colonic polyps: Secondary | ICD-10-CM

## 2012-12-05 NOTE — Progress Notes (Signed)
History of Present Illness: This is an 79 year old male accompanied by his wife. He relates increasing difficulties with constipation over the past several years he now has a bowel movement about every 3-5 days. He uses fiber supplements and friends intermittently. He has no gastrointestinal complaints. He has a prior history of adenomatous colon polyps in 2004. His last colonoscopy 2007 showed only hyperplastic polyp. Denies weight loss, abdominal pain, diarrhea, change in stool caliber, melena, hematochezia, nausea, vomiting, dysphagia, reflux symptoms, chest pain.   Review of Systems: Pertinent positive and negative review of systems were noted in the above HPI section. All other review of systems were otherwise negative.  Current Medications, Allergies, Past Medical History, Past Surgical History, Family History and Social History were reviewed in [employer] record.  Physical Exam: General: Well developed , well nourished, no acute distress Head: Normocephalic and atraumatic Eyes:  sclerae anicteric, EOMI Ears: Normal auditory acuity Mouth: No deformity or lesions Neck: Supple, no masses or thyromegaly Lungs: Clear throughout to auscultation Heart: Regular rate and rhythm; no murmurs, rubs or bruits Abdomen: Soft, non tender and non distended. No masses, hepatosplenomegaly or hernias noted. Normal Bowel sounds Musculoskeletal: Symmetrical with no gross deformities  Skin: No lesions on visible extremities Pulses:  Normal pulses noted Extremities: No clubbing, cyanosis, edema or deformities noted Neurological: Alert oriented x 4, grossly nonfocal Cervical Nodes:  No significant cervical adenopathy Inguinal Nodes: No significant inguinal adenopathy Psychological:  Alert and cooperative. Normal mood and affect  Assessment and Recommendations:  1. Chronic constipation. Increase dietary fiber and water intake on a daily basis. May use a fiber supplement or prunes on a  daily basis as an ongoing strategy to help prevent constipation.  2. Personal history of adenomatous colon polyps. Last colonoscopy was free of adenomatous colon polyps and he is now age 79 (outside the standard age range of up to 66 for routine polyp surveillance). No plans for future screening or surveillance colonoscopies. I will see again as needed.

## 2012-12-05 NOTE — Patient Instructions (Addendum)
We have cancelled you Colonoscopy recalls.  Follow the High Fiber diet given.  High-Fiber Diet Fiber is found in fruits, vegetables, and grains. A high-fiber diet encourages the addition of more whole grains, legumes, fruits, and vegetables in your diet. The recommended amount of fiber for adult males is 38 g per day. For adult females, it is 25 g per day. Pregnant and lactating women should get 28 g of fiber per day. If you have a digestive or bowel problem, ask your caregiver for advice before adding high-fiber foods to your diet. Eat a variety of high-fiber foods instead of only a select few type of foods.  PURPOSE  To increase stool bulk.  To make bowel movements more regular to prevent constipation.  To lower cholesterol.  To prevent overeating. WHEN IS THIS DIET USED?  It may be used if you have constipation and hemorrhoids.  It may be used if you have uncomplicated diverticulosis (intestine condition) and irritable bowel syndrome.  It may be used if you need help with weight management.  It may be used if you want to add it to your diet as a protective measure against atherosclerosis, diabetes, and cancer. SOURCES OF FIBER  Whole-grain breads and cereals.  Fruits, such as apples, oranges, bananas, berries, prunes, and pears.  Vegetables, such as green peas, carrots, sweet potatoes, beets, broccoli, cabbage, spinach, and artichokes.  Legumes, such split peas, soy, lentils.  Almonds. FIBER CONTENT IN FOODS Starches and Grains / Dietary Fiber (g)  Cheerios, 1 cup / 3 g  Corn Flakes cereal, 1 cup / 0.7 g  Rice crispy treat cereal, 1 cup / 0.3 g  Instant oatmeal (cooked),  cup / 2 g  Frosted wheat cereal, 1 cup / 5.1 g  Brown, long-grain rice (cooked), 1 cup / 3.5 g  White, long-grain rice (cooked), 1 cup / 0.6 g  Enriched macaroni (cooked), 1 cup / 2.5 g Legumes / Dietary Fiber (g)  Baked beans (canned, plain, or vegetarian),  cup / 5.2 g  Kidney beans  (canned),  cup / 6.8 g  Pinto beans (cooked),  cup / 5.5 g Breads and Crackers / Dietary Fiber (g)  Plain or honey graham crackers, 2 squares / 0.7 g  Saltine crackers, 3 squares / 0.3 g  Plain, salted pretzels, 10 pieces / 1.8 g  Whole-wheat bread, 1 slice / 1.9 g  White bread, 1 slice / 0.7 g  Raisin bread, 1 slice / 1.2 g  Plain bagel, 3 oz / 2 g  Flour tortilla, 1 oz / 0.9 g  Corn tortilla, 1 small / 1.5 g  Hamburger or hotdog bun, 1 small / 0.9 g Fruits / Dietary Fiber (g)  Apple with skin, 1 medium / 4.4 g  Sweetened applesauce,  cup / 1.5 g  Banana,  medium / 1.5 g  Grapes, 10 grapes / 0.4 g  Orange, 1 small / 2.3 g  Raisin, 1.5 oz / 1.6 g  Melon, 1 cup / 1.4 g Vegetables / Dietary Fiber (g)  Green beans (canned),  cup / 1.3 g  Carrots (cooked),  cup / 2.3 g  Broccoli (cooked),  cup / 2.8 g  Peas (cooked),  cup / 4.4 g  Mashed potatoes,  cup / 1.6 g  Lettuce, 1 cup / 0.5 g  Corn (canned),  cup / 1.6 g  Tomato,  cup / 1.1 g Document Released: 07/26/2005 Document Revised: 01/25/2012 Document Reviewed: 10/28/2011 [facility] Patient Information 2013 [place], [place].  Constipation, Adult Constipation is when a person has fewer than 3 bowel movements a week; has difficulty having a bowel movement; or has stools that are dry, hard, or larger than normal. As people grow older, constipation is more common. If you try to fix constipation with medicines that make you have a bowel movement (laxatives), the problem may get worse. Long-term laxative use may cause the muscles of the colon to become weak. A low-fiber diet, not taking in enough fluids, and taking certain medicines may make constipation worse. CAUSES   Certain medicines, such as antidepressants, pain medicine, iron supplements, antacids, and water pills.   Certain diseases, such as diabetes, irritable bowel syndrome (IBS), thyroid disease, or depression.   Not drinking enough  water.   Not eating enough fiber-rich foods.   Stress or travel.  Lack of physical activity or exercise.  Not going to the restroom when there is the urge to have a bowel movement.  Ignoring the urge to have a bowel movement.  Using laxatives too much. SYMPTOMS   Having fewer than 3 bowel movements a week.   Straining to have a bowel movement.   Having hard, dry, or larger than normal stools.   Feeling full or bloated.   Pain in the lower abdomen.  Not feeling relief after having a bowel movement. DIAGNOSIS  Your caregiver will take a medical history and perform a physical exam. Further testing may be done for severe constipation. Some tests may include:   A barium enema X-ray to examine your rectum, colon, and sometimes, your small intestine.  A sigmoidoscopy to examine your lower colon.  A colonoscopy to examine your entire colon. TREATMENT  Treatment will depend on the severity of your constipation and what is causing it. Some dietary treatments include drinking more fluids and eating more fiber-rich foods. Lifestyle treatments may include regular exercise. If these diet and lifestyle recommendations do not help, your caregiver may recommend taking over-the-counter laxative medicines to help you have bowel movements. Prescription medicines may be prescribed if over-the-counter medicines do not work.  HOME CARE INSTRUCTIONS   Increase dietary fiber in your diet, such as fruits, vegetables, whole grains, and beans. Limit high-fat and processed sugars in your diet, such as [place] fries, hamburgers, cookies, candies, and soda.   A fiber supplement may be added to your diet if you cannot get enough fiber from foods.   Drink enough fluids to keep your urine clear or pale yellow.   Exercise regularly or as directed by your caregiver.   Go to the restroom when you have the urge to go. Do not hold it.  Only take medicines as directed by your caregiver. Do not take  other medicines for constipation without talking to your caregiver first. SEEK IMMEDIATE MEDICAL CARE IF:   You have bright red blood in your stool.   Your constipation lasts for more than 4 days or gets worse.   You have abdominal or rectal pain.   You have thin, pencil-like stools.  You have unexplained weight loss. MAKE SURE YOU:   Understand these instructions.  Will watch your condition.  Will get help right away if you are not doing well or get worse. Document Released: 04/23/2004 Document Revised: 10/18/2011 Document Reviewed: 06/29/2011 [facility] Patient Information 2013 [place], [place].  Thank you for choosing me and [facility].  Venita Lick. Pleas Koch., MD., Clementeen Graham

## 2013-03-19 ENCOUNTER — Encounter: Payer: Self-pay | Admitting: Internal Medicine

## 2013-03-19 ENCOUNTER — Ambulatory Visit (INDEPENDENT_AMBULATORY_CARE_PROVIDER_SITE_OTHER): Payer: Medicare Other | Admitting: Internal Medicine

## 2013-03-19 VITALS — BP 126/70 | HR 72 | Temp 98.2°F | Resp 16 | Ht 66.5 in | Wt 212.0 lb

## 2013-03-19 DIAGNOSIS — E1149 Type 2 diabetes mellitus with other diabetic neurological complication: Secondary | ICD-10-CM

## 2013-03-19 DIAGNOSIS — D519 Vitamin B12 deficiency anemia, unspecified: Secondary | ICD-10-CM

## 2013-03-19 DIAGNOSIS — K5901 Slow transit constipation: Secondary | ICD-10-CM

## 2013-03-19 DIAGNOSIS — I10 Essential (primary) hypertension: Secondary | ICD-10-CM

## 2013-03-19 DIAGNOSIS — E1142 Type 2 diabetes mellitus with diabetic polyneuropathy: Secondary | ICD-10-CM

## 2013-03-19 DIAGNOSIS — E785 Hyperlipidemia, unspecified: Secondary | ICD-10-CM

## 2013-03-19 DIAGNOSIS — D518 Other vitamin B12 deficiency anemias: Secondary | ICD-10-CM

## 2013-03-19 LAB — BASIC METABOLIC PANEL
BUN: 13 mg/dL (ref 6–23)
CO2: 29 mEq/L (ref 19–32)
Chloride: 101 mEq/L (ref 96–112)
Creatinine, Ser: 0.7 mg/dL (ref 0.4–1.5)
Glucose, Bld: 98 mg/dL (ref 70–99)
Potassium: 4.3 mEq/L (ref 3.5–5.1)

## 2013-03-19 LAB — HEMOGLOBIN A1C: Hgb A1c MFr Bld: 7 % — ABNORMAL HIGH (ref 4.6–6.5)

## 2013-03-19 MED ORDER — CYANOCOBALAMIN 1000 MCG/ML IJ SOLN
1000.0000 ug | INTRAMUSCULAR | Status: AC
  Administered 2013-03-19: 1000 ug via INTRAMUSCULAR

## 2013-03-19 NOTE — Patient Instructions (Addendum)
b12 shots at [employer]

## 2013-03-19 NOTE — Progress Notes (Signed)
Subjective:    Patient ID: Nathan Cunningham, male    DOB: 1925-04-22, 79 y.o.   MRN: [id number]  HPI  Dr Russella Dar did not think that a colon was appropriate due to overall condition We follow the patient for his diabetes he has a history of atrial flutter His history of mild gastroparesis and constipation.  Review of Systems  HENT: Positive for congestion.   Respiratory: Positive for shortness of breath.   Gastrointestinal: Positive for constipation.  Musculoskeletal: Positive for back pain and gait problem.  Neurological: Positive for weakness.   Past Medical History  Diagnosis Date  . Hypertension   . Diabetes mellitus   . Hyperlipidemia   . Macular degeneration     Right eye  . Constipation   . ETOH abuse     binge drinking  . Dizziness   . Diverticulosis   . Adenomatous polyp of colon 03/2003  . Hemorrhoids   . Sleep apnea     on CPAP    History   Social History  . Marital Status: Divorced    Spouse Name: N/A    Number of Children: 2  . Years of Education: N/A   Occupational History  . retired    Social History Main Topics  . Smoking status: Never Smoker   . Smokeless tobacco: Never Used  . Alcohol Use: 7.0 oz/week    14 drink(s) per week  . Drug Use: No  . Sexually Active: Yes   Other Topics Concern  . Not on file   Social History Narrative  . No narrative on file    Past Surgical History  Procedure Laterality Date  . Appendectomy      age 70  . Cataract extraction      Left eye  . Inguinal hernia repair      age 64  . Cevical fusion    . Cardiac defibrillator placement    . Tonsillectomy      Family History  Problem Relation Age of Onset  . Heart disease Father   . Stroke Father 24    No Known Allergies  Current Outpatient Prescriptions on File Prior to Visit  Medication Sig Dispense Refill  . amitriptyline (ELAVIL) 75 MG tablet Take 1 tablet (75 mg total) by mouth at bedtime.  90 tablet  2  . aspirin 325 MG tablet Take 162 mg by  mouth daily.      . Chromium Picolinate (RA CHROMIUM PICOLINATE) 400 MCG TABS Take 1 tablet by mouth daily.        Marland Kitchen docusate sodium (COLACE) 100 MG capsule Take 100 mg by mouth 3 (three) times daily as needed.        . dorzolamide-timolol (COSOPT) 22.3-6.8 MG/ML ophthalmic solution 1 drop 2 (two) times daily.        . fish oil-omega-3 fatty acids 1000 MG capsule Take by mouth. Take six daily      . glipiZIDE (GLIPIZIDE XL) 5 MG 24 hr tablet Take 1 tablet (5 mg total) by mouth daily.  90 tablet  3  . Magnesium-Calcium-Folic Acid (MAGNEBIND 400) 409-811-9 MG TABS Take 1 tablet by mouth daily.        . Melatonin 5 MG CAPS Take 1 each by mouth daily.        . Multiple Vitamins-Minerals (EYE-VITES) TABS Take 1 tablet by mouth 2 (two) times daily.        . mupirocin ointment (BACTROBAN) 2 % Apply topically 3 (three) times daily.  22  g  0  . Potassium Gluconate (K-99) 595 MG CAPS Take 1 each by mouth daily.        . Saxagliptin-Metformin (KOMBIGLYZE XR) 12-998 MG TB24 Take 1 each by mouth daily.  90 tablet  3  . telmisartan (MICARDIS) 80 MG tablet TAKE 1 TABLET DAILY  90 tablet  3   Current Facility-Administered Medications on File Prior to Visit  Medication Dose Route Frequency Provider Last Rate Last Dose  . cyanocobalamin ((VITAMIN B-12)) injection 1,000 mcg  1,000 mcg Intramuscular Q30 days Stacie Glaze, MD   1,000 mcg at 07/06/11 1148    BP 126/70  Pulse 72  Temp(Src) 98.2 F (36.8 C)  Resp 16  Ht 5' 6.5" (1.689 m)  Wt 212 lb (96.163 kg)  BMI 33.71 kg/m2       Objective:   Physical Exam  Constitutional: He appears well-developed and well-nourished.  HENT:  Head: Normocephalic and atraumatic.  Eyes: Conjunctivae are normal. Pupils are equal, round, and reactive to light.  Neck: Normal range of motion. Neck supple.  Cardiovascular: Normal rate and regular rhythm.   Murmur heard. Pulmonary/Chest: Effort normal and breath sounds normal.  Abdominal: Soft. Bowel sounds are normal.           Assessment & Plan:  Reviewed indications for colonoscopy with the patient and agree its appropriate at this point to hold unless there are symptoms for blood  loss anemia Measure a CBC along with hemoglobin A1c and basic metabolic panel that we plan to monitor today An LDL C. Will also be monitored

## 2013-03-20 LAB — CBC WITH DIFFERENTIAL/PLATELET
HCT: 37.9 % — ABNORMAL LOW (ref 39.0–52.0)
MCHC: 32.7 g/dL (ref 30.0–36.0)
MCV: 95.6 fl (ref 78.0–100.0)
Platelets: 180 10*3/uL (ref 150.0–400.0)
WBC: 7.4 10*3/uL (ref 4.5–10.5)

## 2013-05-16 ENCOUNTER — Ambulatory Visit (INDEPENDENT_AMBULATORY_CARE_PROVIDER_SITE_OTHER): Payer: Medicare Other | Admitting: *Deleted

## 2013-05-16 DIAGNOSIS — E538 Deficiency of other specified B group vitamins: Secondary | ICD-10-CM

## 2013-05-16 DIAGNOSIS — Z23 Encounter for immunization: Secondary | ICD-10-CM

## 2013-05-16 MED ORDER — CYANOCOBALAMIN 1000 MCG/ML IJ SOLN
1000.0000 ug | Freq: Once | INTRAMUSCULAR | Status: AC
  Administered 2013-05-16: 1000 ug via INTRAMUSCULAR

## 2013-05-24 ENCOUNTER — Encounter: Payer: Self-pay | Admitting: Pulmonary Disease

## 2013-05-24 ENCOUNTER — Ambulatory Visit (INDEPENDENT_AMBULATORY_CARE_PROVIDER_SITE_OTHER): Payer: Medicare Other | Admitting: Pulmonary Disease

## 2013-05-24 VITALS — BP 126/72 | HR 78 | Temp 98.1°F | Ht 68.5 in | Wt 216.6 lb

## 2013-05-24 DIAGNOSIS — G4731 Primary central sleep apnea: Secondary | ICD-10-CM

## 2013-05-24 DIAGNOSIS — G4730 Sleep apnea, unspecified: Secondary | ICD-10-CM

## 2013-05-24 NOTE — Assessment & Plan Note (Signed)
The patient is doing very well on his current CPAP setup, but is having frequent awakenings at night because of nocturia.  His wife has not her breakthrough snoring.  I suspect this is because of urinary issues rather than his sleep disordered breathing awakening him at night.  I have recommended that he consider seeing a urologist.  In the meantime, I have asked him to continue on his current CPAP setup, and to keep up with his mask changes and supplies.

## 2013-05-24 NOTE — Patient Instructions (Signed)
Continue with cpap, keep up with mask changes and supplies. Would consider seeing a urologist for your urinary frequency at night. Work on weight loss followup with me in one year.

## 2013-05-24 NOTE — Progress Notes (Signed)
  Subjective:    Patient ID: Nathan Cunningham, male    DOB: 1925/02/23, 79 y.o.   MRN: [id number]  HPI The patient comes in today for followup of his complex sleep apnea.  He has been on CPAP at 17 cm of water pressure, and has had excellent tolerance.  He has found a mask that fits well, and he wears the device compliantly.  His wife has heard no breakthrough snoring, and a download from his device this spring showed excellent control of his obstructive and central events.  His only complaint today is that of nocturia, which is leading to sleep disruption and some daytime sleepiness.  He has had long-standing obstructive symptoms.   Review of Systems  Constitutional: Positive for fatigue ( low energy levels). Negative for fever and unexpected weight change.  HENT: Negative for congestion, dental problem, ear pain, nosebleeds, postnasal drip, rhinorrhea, sinus pressure, sneezing, sore throat and trouble swallowing.   Eyes: Negative for redness and itching.  Respiratory: Negative for cough, chest tightness, shortness of breath and wheezing.   Cardiovascular: Negative for palpitations and leg swelling.  Gastrointestinal: Negative for nausea and vomiting.  Genitourinary: Positive for frequency. Negative for dysuria.  Musculoskeletal: Negative for joint swelling.  Skin: Negative for rash.  Neurological: Negative for headaches.  Hematological: Does not bruise/bleed easily.  Psychiatric/Behavioral: Positive for sleep disturbance (Unrinary frequency). Negative for dysphoric mood. The patient is not nervous/anxious.        Objective:   Physical Exam Overweight male in no acute distress Nose without purulence or discharge noted No skin breakdown or pressure necrosis from the CPAP mask Neck without lymphadenopathy or thyromegaly Lower extremities with mild edema, no cyanosis Alert, does not appear to be overly sleepy, moves all 4 extremities.       Assessment & Plan:

## 2013-06-15 ENCOUNTER — Ambulatory Visit (INDEPENDENT_AMBULATORY_CARE_PROVIDER_SITE_OTHER): Payer: Medicare Other

## 2013-06-15 DIAGNOSIS — E539 Vitamin B deficiency, unspecified: Secondary | ICD-10-CM

## 2013-06-15 MED ORDER — CYANOCOBALAMIN 1000 MCG/ML IJ SOLN
1000.0000 ug | Freq: Once | INTRAMUSCULAR | Status: AC
  Administered 2013-06-15: 1000 ug via INTRAMUSCULAR

## 2013-07-11 ENCOUNTER — Other Ambulatory Visit: Payer: Self-pay | Admitting: *Deleted

## 2013-07-11 MED ORDER — GLIPIZIDE ER 5 MG PO TB24: 5.0000 mg | ORAL_TABLET | Freq: Every day | ORAL | Status: DC

## 2013-07-17 ENCOUNTER — Ambulatory Visit (INDEPENDENT_AMBULATORY_CARE_PROVIDER_SITE_OTHER): Payer: Medicare Other

## 2013-07-17 DIAGNOSIS — E538 Deficiency of other specified B group vitamins: Secondary | ICD-10-CM

## 2013-07-17 MED ORDER — CYANOCOBALAMIN 1000 MCG/ML IJ SOLN
1000.0000 ug | Freq: Once | INTRAMUSCULAR | Status: AC
  Administered 2013-07-17: 1000 ug via INTRAMUSCULAR

## 2013-07-23 ENCOUNTER — Ambulatory Visit (INDEPENDENT_AMBULATORY_CARE_PROVIDER_SITE_OTHER): Payer: Medicare Other | Admitting: Internal Medicine

## 2013-07-23 ENCOUNTER — Encounter: Payer: Self-pay | Admitting: Internal Medicine

## 2013-07-23 VITALS — BP 122/70 | HR 72 | Temp 98.1°F | Resp 16 | Ht 68.5 in | Wt 215.0 lb

## 2013-07-23 DIAGNOSIS — T50904A Poisoning by unspecified drugs, medicaments and biological substances, undetermined, initial encounter: Secondary | ICD-10-CM

## 2013-07-23 DIAGNOSIS — I4892 Unspecified atrial flutter: Secondary | ICD-10-CM

## 2013-07-23 DIAGNOSIS — E1149 Type 2 diabetes mellitus with other diabetic neurological complication: Secondary | ICD-10-CM

## 2013-07-23 DIAGNOSIS — I10 Essential (primary) hypertension: Secondary | ICD-10-CM

## 2013-07-23 DIAGNOSIS — K5909 Other constipation: Secondary | ICD-10-CM

## 2013-07-23 DIAGNOSIS — M5000 Cervical disc disorder with myelopathy, unspecified cervical region: Secondary | ICD-10-CM

## 2013-07-23 DIAGNOSIS — K5903 Drug induced constipation: Secondary | ICD-10-CM

## 2013-07-23 LAB — COMPREHENSIVE METABOLIC PANEL
BUN: 15 mg/dL (ref 6–23)
CO2: 30 mEq/L (ref 19–32)
Creatinine, Ser: 0.8 mg/dL (ref 0.4–1.5)
GFR: 101.79 mL/min (ref >60.00)
Glucose, Bld: 159 mg/dL — ABNORMAL HIGH (ref 70–99)
Sodium: 137 mEq/L (ref 135–145)
Total Bilirubin: 0.8 mg/dL (ref 0.3–1.2)
Total Protein: 7.1 g/dL (ref 6.0–8.3)

## 2013-07-23 MED ORDER — LINACLOTIDE 145 MCG PO CAPS: 145.0000 ug | ORAL_CAPSULE | Freq: Every day | ORAL | Status: DC

## 2013-07-23 NOTE — Patient Instructions (Signed)
The patient is instructed to continue all medications as prescribed. Schedule followup with check out clerk upon leaving the clinic  

## 2013-07-23 NOTE — Progress Notes (Signed)
Subjective:    Patient ID: Nathan Cunningham, male    DOB: 1925/01/01, 79 y.o.   MRN: [id number]  HPI Mild discomfort at site of hernia repair Has moderate constipation that might have complicated this Last BM was 6 days Distended AF Diet is an issue  CPAP   Review of Systems  Constitutional: Negative for fever and fatigue.  HENT: Negative for congestion, hearing loss and postnasal drip.   Eyes: Negative for discharge, redness and visual disturbance.  Respiratory: Positive for shortness of breath. Negative for cough and wheezing.   Cardiovascular: Negative for leg swelling.  Gastrointestinal: Negative for abdominal pain, constipation and abdominal distention.  Genitourinary: Negative for urgency and frequency.  Musculoskeletal: Positive for back pain and gait problem. Negative for arthralgias, joint swelling and neck pain.  Skin: Negative for color change and rash.  Neurological: Negative for weakness and light-headedness.  Hematological: Negative for adenopathy.  Psychiatric/Behavioral: Negative for behavioral problems.   Past Medical History  Diagnosis Date  . Hypertension   . Diabetes mellitus   . Hyperlipidemia   . Macular degeneration     Right eye  . Constipation   . ETOH abuse     binge drinking  . Dizziness   . Diverticulosis   . Adenomatous polyp of colon 03/2003  . Hemorrhoids   . Sleep apnea     on CPAP    History   Social History  . Marital Status: Divorced    Spouse Name: N/A    Number of Children: 2  . Years of Education: N/A   Occupational History  . retired    Social History Main Topics  . Smoking status: Never Smoker   . Smokeless tobacco: Never Used  . Alcohol Use: 7.0 oz/week    14 drink(s) per week  . Drug Use: No  . Sexual Activity: Yes   Other Topics Concern  . Not on file   Social History Narrative  . No narrative on file    Past Surgical History  Procedure Laterality Date  . Appendectomy      age 54  . Cataract  extraction      Left eye  . Inguinal hernia repair      age 56  . Cevical fusion    . Cardiac defibrillator placement    . Tonsillectomy      Family History  Problem Relation Age of Onset  . Heart disease Father   . Stroke Father 63    No Known Allergies  Current Outpatient Prescriptions on File Prior to Visit  Medication Sig Dispense Refill  . amitriptyline (ELAVIL) 75 MG tablet Take 1 tablet (75 mg total) by mouth at bedtime.  90 tablet  2  . aspirin 325 MG tablet Take 162 mg by mouth daily.      . Chromium Picolinate (RA CHROMIUM PICOLINATE) 400 MCG TABS Take 1 tablet by mouth daily.        Marland Kitchen docusate sodium (COLACE) 100 MG capsule Take 100 mg by mouth 3 (three) times daily as needed.        . dorzolamide-timolol (COSOPT) 22.3-6.8 MG/ML ophthalmic solution 1 drop 2 (two) times daily.        . fish oil-omega-3 fatty acids 1000 MG capsule Take by mouth. Take six daily      . glipiZIDE (GLIPIZIDE XL) 5 MG 24 hr tablet Take 1 tablet (5 mg total) by mouth daily.  90 tablet  3  . Magnesium-Calcium-Folic Acid (MAGNEBIND 400) 409-811-9 MG  TABS Take 1 tablet by mouth daily.        . Melatonin 5 MG CAPS Take 1 each by mouth daily.        . Multiple Vitamins-Minerals (EYE-VITES) TABS Take 1 tablet by mouth 2 (two) times daily.        . Potassium Gluconate (K-99) 595 MG CAPS Take 1 each by mouth daily.        . Saxagliptin-Metformin (KOMBIGLYZE XR) 12-998 MG TB24 Take 1 each by mouth daily.  90 tablet  3  . telmisartan (MICARDIS) 80 MG tablet TAKE 1 TABLET DAILY  90 tablet  3   Current Facility-Administered Medications on File Prior to Visit  Medication Dose Route Frequency Provider Last Rate Last Dose  . cyanocobalamin ((VITAMIN B-12)) injection 1,000 mcg  1,000 mcg Intramuscular Q30 days Stacie Glaze, MD   1,000 mcg at 07/06/11 1148    BP 122/70  Pulse 72  Temp(Src) 98.1 F (36.7 C)  Resp 16  Ht 5' 8.5" (1.74 m)  Wt 215 lb (97.523 kg)  BMI 32.21 kg/m2       Objective:    Physical Exam  Nursing note and vitals reviewed. Constitutional: He appears well-developed and well-nourished.  HENT:  Head: Normocephalic and atraumatic.  Eyes: Conjunctivae are normal. Pupils are equal, round, and reactive to light.  Neck: Normal range of motion. Neck supple.  Cardiovascular: Normal rate and regular rhythm.   Murmur heard. Pulmonary/Chest: Effort normal and breath sounds normal.  Abdominal: Soft. Bowel sounds are normal. He exhibits distension. There is tenderness.          Assessment & Plan:  Add pro-mobility linzess 145 instead of colace Add prunes Monitor DM and renal function

## 2013-07-23 NOTE — Progress Notes (Signed)
Pre visit review using our clinic review tool, if applicable. No additional management support is needed unless otherwise documented below in the visit note. 

## 2013-08-21 ENCOUNTER — Ambulatory Visit (INDEPENDENT_AMBULATORY_CARE_PROVIDER_SITE_OTHER): Payer: Medicare Other | Admitting: *Deleted

## 2013-08-21 ENCOUNTER — Telehealth: Payer: Self-pay | Admitting: *Deleted

## 2013-08-21 DIAGNOSIS — E538 Deficiency of other specified B group vitamins: Secondary | ICD-10-CM

## 2013-08-21 DIAGNOSIS — Z23 Encounter for immunization: Secondary | ICD-10-CM

## 2013-08-21 MED ORDER — CYANOCOBALAMIN 1000 MCG/ML IJ SOLN
1000.0000 ug | Freq: Once | INTRAMUSCULAR | Status: AC
  Administered 2013-08-21: 1000 ug via INTRAMUSCULAR

## 2013-08-21 NOTE — Telephone Encounter (Signed)
Injections given

## 2013-08-21 NOTE — Progress Notes (Signed)
Patient ID: Nathan Cunningham, male   DOB: 1925-06-12, 80 y.o.   MRN: [id number]

## 2013-08-21 NOTE — Progress Notes (Deleted)
   Subjective:    Patient ID: Nathan Cunningham, male    DOB: 03/18/1925, 80 y.o.   MRN: [id number]  HPI    Review of Systems     Objective:   Physical Exam        Assessment & Plan:

## 2013-08-21 NOTE — Progress Notes (Addendum)
When the patient (Dr. Arnoldo Morale patient) arrived for the scheduled B12 injection, he said he had been advised by Dr. Arnoldo Morale to also get a second flu vaccine and asked if he could receive that at this visit.  Ebony Hail (receptionist) called Dr. Arnoldo Morale' office and spoke with Horris Latino who said it would be okay to give the vaccine.  Vaccine administered.  I approached Benjie Karvonen, RN Team Lead about this matter and she phoned Bonnye at [facility] who said the patient received a half dose of flu vaccine last fall and needed another vaccine this year.

## 2013-09-26 ENCOUNTER — Ambulatory Visit: Payer: Medicare Other

## 2013-10-04 ENCOUNTER — Ambulatory Visit: Payer: Medicare Other

## 2013-10-04 ENCOUNTER — Telehealth: Payer: Self-pay | Admitting: Internal Medicine

## 2013-10-04 NOTE — Telephone Encounter (Signed)
[facility] (MAIL ORDER) requesting refill of amitriptyline (ELAVIL) 75 MG tablet

## 2013-10-05 ENCOUNTER — Other Ambulatory Visit: Payer: Self-pay | Admitting: *Deleted

## 2013-10-05 DIAGNOSIS — M792 Neuralgia and neuritis, unspecified: Secondary | ICD-10-CM

## 2013-10-05 MED ORDER — AMITRIPTYLINE HCL 75 MG PO TABS: 75.0000 mg | ORAL_TABLET | Freq: Every day | ORAL | Status: DC

## 2013-10-05 NOTE — Telephone Encounter (Signed)
done

## 2013-11-20 ENCOUNTER — Telehealth: Payer: Self-pay | Admitting: Internal Medicine

## 2013-11-20 MED ORDER — SAXAGLIPTIN-METFORMIN ER 5-1000 MG PO TB24: 1.0000 | ORAL_TABLET | Freq: Every day | ORAL | Status: DC

## 2013-11-20 NOTE — Telephone Encounter (Signed)
rx for metformin refilled and sent to [facility]

## 2013-11-20 NOTE — Telephone Encounter (Signed)
[facility] (MAIL [facility]) [facility] - [place], [place] is requesting re-fill on Saxagliptin-Metformin (KOMBIGLYZE XR) 12-998 MG TB24

## 2013-11-28 ENCOUNTER — Ambulatory Visit: Payer: Medicare Other | Admitting: Internal Medicine

## 2013-11-28 ENCOUNTER — Telehealth: Payer: Self-pay | Admitting: Internal Medicine

## 2013-11-28 NOTE — Telephone Encounter (Signed)
[facility] (MAIL ORDER) [facility] - [place], [place] requesting re-fill on telmisartan (MICARDIS) 80 MG tablet, 90 day supply

## 2013-11-29 MED ORDER — TELMISARTAN 80 MG PO TABS
ORAL_TABLET | ORAL | Status: DC
Start: 2013-11-29 — End: 2014-11-29

## 2013-11-29 NOTE — Telephone Encounter (Signed)
Rx sent to [facility]  

## 2013-12-07 ENCOUNTER — Encounter: Payer: Self-pay | Admitting: Internal Medicine

## 2013-12-07 ENCOUNTER — Ambulatory Visit (INDEPENDENT_AMBULATORY_CARE_PROVIDER_SITE_OTHER): Payer: Medicare Other | Admitting: Internal Medicine

## 2013-12-07 VITALS — BP 130/70 | HR 68 | Temp 97.8°F | Wt 216.0 lb

## 2013-12-07 DIAGNOSIS — IMO0001 Reserved for inherently not codable concepts without codable children: Secondary | ICD-10-CM

## 2013-12-07 DIAGNOSIS — K4090 Unilateral inguinal hernia, without obstruction or gangrene, not specified as recurrent: Secondary | ICD-10-CM

## 2013-12-07 DIAGNOSIS — E538 Deficiency of other specified B group vitamins: Secondary | ICD-10-CM

## 2013-12-07 DIAGNOSIS — E1165 Type 2 diabetes mellitus with hyperglycemia: Principal | ICD-10-CM

## 2013-12-07 LAB — COMPREHENSIVE METABOLIC PANEL
ALBUMIN: 3.6 g/dL (ref 3.5–5.2)
ALT: 16 U/L (ref 0–53)
AST: 14 U/L (ref 0–37)
Alkaline Phosphatase: 42 U/L (ref 39–117)
BUN: 15 mg/dL (ref 6–23)
CALCIUM: 9.3 mg/dL (ref 8.4–10.5)
CO2: 30 meq/L (ref 19–32)
Chloride: 99 mEq/L (ref 96–112)
Creatinine, Ser: 0.8 mg/dL (ref 0.4–1.5)
GFR: 100.19 mL/min (ref >60.00)
GLUCOSE: 102 mg/dL — AB (ref 70–99)
POTASSIUM: 4.4 meq/L (ref 3.5–5.1)
Sodium: 136 mEq/L (ref 135–145)
Total Bilirubin: 0.7 mg/dL (ref 0.3–1.2)
Total Protein: 7.3 g/dL (ref 6.0–8.3)

## 2013-12-07 LAB — HEMOGLOBIN A1C: Hgb A1c MFr Bld: 7.1 % — ABNORMAL HIGH (ref 4.6–6.5)

## 2013-12-07 MED ORDER — CYANOCOBALAMIN 1000 MCG/ML IJ SOLN
1000.0000 ug | Freq: Once | INTRAMUSCULAR | Status: AC
  Administered 2013-12-07: 1000 ug via INTRAMUSCULAR

## 2013-12-07 NOTE — Progress Notes (Signed)
Subjective:    Patient ID: Nathan Cunningham, male    DOB: 01/29/1925, 80 y.o.   MRN: [id number]  HPI  Follow up for for DM Need to transfer to [facility] and continue the relationship with [facility] specialist  Still has pain in the right groin that is increased with twisting. But does not feel the pain with a cough. Has not been exercising due to to the Pain In an area of "childhood" hernia and at the site of his appendix scar  Has significant abd distension for weight     Review of Systems  Constitutional: Positive for fatigue. Negative for fever.  HENT: Negative for congestion, hearing loss and postnasal drip.   Eyes: Negative for discharge, redness and visual disturbance.  Respiratory: Positive for shortness of breath. Negative for cough and wheezing.   Cardiovascular: Positive for leg swelling.  Gastrointestinal: Negative for abdominal pain, constipation and abdominal distention.  Genitourinary: Negative for urgency and frequency.  Musculoskeletal: Negative for arthralgias, joint swelling and neck pain.  Skin: Negative for color change and rash.  Neurological: Positive for weakness and numbness. Negative for light-headedness.  Hematological: Negative for adenopathy.  Psychiatric/Behavioral: Negative for behavioral problems.   Past Medical History  Diagnosis Date  . Hypertension   . Diabetes mellitus   . Hyperlipidemia   . Macular degeneration     Right eye  . Constipation   . ETOH abuse     binge drinking  . Dizziness   . Diverticulosis   . Adenomatous polyp of colon 03/2003  . Hemorrhoids   . Sleep apnea     on CPAP    History   Social History  . Marital Status: Divorced    Spouse Name: N/A    Number of Children: 2  . Years of Education: N/A   Occupational History  . retired    Social History Main Topics  . Smoking status: Never Smoker   . Smokeless tobacco: Never Used  . Alcohol Use: 7.0 oz/week    14 drink(s) per week  . Drug Use: No  .  Sexual Activity: Yes   Other Topics Concern  . Not on file   Social History Narrative  . No narrative on file    Past Surgical History  Procedure Laterality Date  . Appendectomy      age 64  . Cataract extraction      Left eye  . Inguinal hernia repair      age 52  . Cevical fusion    . Cardiac defibrillator placement    . Tonsillectomy      Family History  Problem Relation Age of Onset  . Heart disease Father   . Stroke Father 4    No Known Allergies  Current Outpatient Prescriptions on File Prior to Visit  Medication Sig Dispense Refill  . amitriptyline (ELAVIL) 75 MG tablet Take 1 tablet (75 mg total) by mouth at bedtime.  90 tablet  3  . aspirin 325 MG tablet Take 162 mg by mouth daily.      . Chromium Picolinate (RA CHROMIUM PICOLINATE) 400 MCG TABS Take 1 tablet by mouth daily.        Marland Kitchen docusate sodium (COLACE) 100 MG capsule Take 100 mg by mouth 3 (three) times daily as needed.        . dorzolamide-timolol (COSOPT) 22.3-6.8 MG/ML ophthalmic solution 1 drop 2 (two) times daily.        . fish oil-omega-3 fatty acids 1000 MG capsule  Take by mouth. Take six daily      . glipiZIDE (GLIPIZIDE XL) 5 MG 24 hr tablet Take 1 tablet (5 mg total) by mouth daily.  90 tablet  3  . Linaclotide (LINZESS) 145 MCG CAPS capsule Take 1 capsule (145 mcg total) by mouth daily.  30 capsule    . Magnesium-Calcium-Folic Acid (MAGNEBIND 644) 400-200-1 MG TABS Take 1 tablet by mouth daily.        . Melatonin 5 MG CAPS Take 1 each by mouth daily.        . Multiple Vitamins-Minerals (EYE-VITES) TABS Take 1 tablet by mouth 2 (two) times daily.        . Potassium Gluconate (K-99) 595 MG CAPS Take 1 each by mouth daily.        . Saxagliptin-Metformin (KOMBIGLYZE XR) 12-998 MG TB24 Take 1 each by mouth daily.  90 tablet  3  . telmisartan (MICARDIS) 80 MG tablet TAKE 1 TABLET DAILY  90 tablet  3   Current Facility-Administered Medications on File Prior to Visit  Medication Dose Route Frequency  Provider Last Rate Last Dose  . cyanocobalamin ((VITAMIN B-12)) injection 1,000 mcg  1,000 mcg Intramuscular Q30 days Ricard Dillon, MD   1,000 mcg at 07/06/11 1148    BP 130/70  Pulse 68  Temp(Src) 97.8 F (36.6 C) (Oral)  Wt 216 lb (97.977 kg)     .    Objective:   Physical Exam  Nursing note and vitals reviewed. Constitutional: He appears well-nourished.  HENT:  Head: Normocephalic and atraumatic.  Eyes: Conjunctivae are normal. Pupils are equal, round, and reactive to light.  Neck: Neck supple.  Cardiovascular: Normal rate and regular rhythm.   Murmur heard. Pulmonary/Chest: Breath sounds normal.  Abdominal: Bowel sounds are normal.  At right groin reproducible tenderness over right inguinal area and area of herniation at scar  Musculoskeletal: He exhibits tenderness.          Assessment & Plan:  The paitent has a right indirect inguinal  hernia and the pain has interfered with his ability to exercise. Monitoring for DM with AIC and labs Stable HTN

## 2013-12-07 NOTE — Progress Notes (Signed)
Pre visit review using our clinic review tool, if applicable. No additional management support is needed unless otherwise documented below in the visit note. 

## 2013-12-07 NOTE — Patient Instructions (Addendum)
The patient is instructed to continue all medications as prescribed. Schedule followup with check out clerk upon leaving the clinic Dr's Floydene Flock and Dr Damita Dunnings

## 2013-12-17 LAB — HM DIABETES EYE EXAM

## 2013-12-20 ENCOUNTER — Ambulatory Visit (INDEPENDENT_AMBULATORY_CARE_PROVIDER_SITE_OTHER): Payer: Medicare Other | Admitting: General Surgery

## 2013-12-20 ENCOUNTER — Encounter (INDEPENDENT_AMBULATORY_CARE_PROVIDER_SITE_OTHER): Payer: Self-pay | Admitting: General Surgery

## 2013-12-20 VITALS — BP 126/80 | HR 83 | Temp 98.4°F | Ht 68.0 in | Wt 214.0 lb

## 2013-12-20 DIAGNOSIS — R1031 Right lower quadrant pain: Secondary | ICD-10-CM

## 2013-12-20 NOTE — Patient Instructions (Signed)
If it worsens - please call me at [phone]  Inguinal Strain Your exam shows you have an inguinal strain. This is also known as a pulled groin. This injury is usually due to a pull or partial tear to a muscle or tendon in the groin area. Most groin pulls take several weeks to heal completely. There may be pain with lifting your leg or walking during much of your recovery. Treatment for groin strains includes:  Rest and avoid lifting or performing activities that increase your pain.  Apply ice packs for 20-30 minutes every few hours to reduce pain and swelling over the next 2-3 days.  Medicine to reduce pain and inflammation is often prescribed. HOME CARE INSTRUCTIONS  While most strains in the groin area will heal with rest, you should also watch for any signs of a more serious condition.  SEEK IMMEDIATE MEDICAL CARE IF:   You notice unusual swelling or bulging in the groin.  You have pain or swelling in the testicle.  Blood in your urine.  Marked increased pain.  Weakness or numbness of your leg or abdominal pain. MAKE SURE YOU:   Understand these instructions.  Will watch your condition.  Will get help right away if you are not doing well or get worse. Document Released: 09/02/2004 Document Revised: 10/18/2011 Document Reviewed: 11/30/2007 [facility] Patient Information 2014 [place].

## 2013-12-20 NOTE — Progress Notes (Signed)
Patient ID: Nathan Cunningham, male   DOB: 21-Mar-1925, 81 y.o.   MRN: [id number]  Chief Complaint  Patient presents with  . eval hernia    HPI Nathan Cunningham is a 80 y.o. male.   HPI 80 year old Caucasian male referred by Dr. Arnoldo Morale for evaluation of right groin pain. The patient states that he underwent an open writing or hernia repair and appendectomy about 8 years ago. About 9 months ago he started to notice some intermittent discomfort in his right groin. He would describe as a dull ache or a throb. It was initially intermittent but now it is more constant. The  discomfort varies at times. Certain movements or positions increase the intensity of the discomfort. Movement such as twisting increases thediscomfort. He has never noticed a bulge. He has baseline constipation and has a bowel movement every 4 days. When it does increase in intensity it only lasts for a few seconds Past Medical History  Diagnosis Date  . Hypertension   . Diabetes mellitus   . Hyperlipidemia   . Macular degeneration     Right eye  . Constipation   . ETOH abuse     binge drinking  . Dizziness   . Diverticulosis   . Adenomatous polyp of colon 03/2003  . Hemorrhoids   . Sleep apnea     on CPAP    Past Surgical History  Procedure Laterality Date  . Appendectomy      age 65  . Cataract extraction      Left eye  . Inguinal hernia repair      age 17  . Cevical fusion    . Cardiac defibrillator placement    . Tonsillectomy      Family History  Problem Relation Age of Onset  . Heart disease Father   . Stroke Father 19    Social History History  Substance Use Topics  . Smoking status: Never Smoker   . Smokeless tobacco: Never Used  . Alcohol Use: 7.0 oz/week    14 drink(s) per week    No Known Allergies  Current Outpatient Prescriptions  Medication Sig Dispense Refill  . amitriptyline (ELAVIL) 75 MG tablet Take 1 tablet (75 mg total) by mouth at bedtime.  90 tablet  3  . aspirin 325  MG tablet Take 162 mg by mouth daily.      . Chromium Picolinate (RA CHROMIUM PICOLINATE) 400 MCG TABS Take 1 tablet by mouth daily.        Marland Kitchen docusate sodium (COLACE) 100 MG capsule Take 100 mg by mouth 3 (three) times daily as needed.        . dorzolamide-timolol (COSOPT) 22.3-6.8 MG/ML ophthalmic solution 1 drop 2 (two) times daily.        . fish oil-omega-3 fatty acids 1000 MG capsule Take by mouth. Take six daily      . glipiZIDE (GLIPIZIDE XL) 5 MG 24 hr tablet Take 1 tablet (5 mg total) by mouth daily.  90 tablet  3  . Linaclotide (LINZESS) 145 MCG CAPS capsule Take 1 capsule (145 mcg total) by mouth daily.  30 capsule    . Magnesium-Calcium-Folic Acid (MAGNEBIND 270) 400-200-1 MG TABS Take 1 tablet by mouth daily.        . Melatonin 5 MG CAPS Take 1 each by mouth daily.        . Multiple Vitamins-Minerals (EYE-VITES) TABS Take 1 tablet by mouth 2 (two) times daily.        Marland Kitchen  Potassium Gluconate (K-99) 595 MG CAPS Take 1 each by mouth daily.        . Saxagliptin-Metformin (KOMBIGLYZE XR) 12-998 MG TB24 Take 1 each by mouth daily.  90 tablet  3  . telmisartan (MICARDIS) 80 MG tablet TAKE 1 TABLET DAILY  90 tablet  3   Current Facility-Administered Medications  Medication Dose Route Frequency Provider Last Rate Last Dose  . cyanocobalamin ((VITAMIN B-12)) injection 1,000 mcg  1,000 mcg Intramuscular Q30 days Ricard Dillon, MD   1,000 mcg at 07/06/11 1148    Review of Systems Review of Systems  Constitutional: Negative for fever, chills, appetite change and unexpected weight change.  HENT: Negative for congestion and trouble swallowing.   Eyes: Negative for visual disturbance.       Decreased vision due to macular degeneration  Respiratory: Negative for chest tightness and shortness of breath.        +OSA on CPAP  Cardiovascular: Negative for chest pain and leg swelling.       No PND, no orthopnea, no DOE  Gastrointestinal: Positive for constipation. Negative for nausea and diarrhea.         See HPI  Endocrine:       DM neuropathy  Genitourinary: Negative for dysuria and hematuria.       +notcuria/weak stream  Musculoskeletal: Positive for gait problem.       Uses walker  Skin: Negative for rash.  Neurological: Negative for seizures and speech difficulty.  Hematological: Does not bruise/bleed easily.  Psychiatric/Behavioral: Negative for behavioral problems and confusion.    Blood pressure 126/80, pulse 83, temperature 98.4 F (36.9 C), height 5\' 8"  (1.727 m), weight 214 lb (97.07 kg).  Physical Exam Physical Exam  Vitals reviewed. Constitutional: He is oriented to person, place, and time. He appears well-developed and well-nourished. No distress.  Elderly wm in NAD  HENT:  Head: Normocephalic and atraumatic.  Right Ear: External ear normal.  Left Ear: External ear normal.  Eyes: Conjunctivae are normal. No scleral icterus.  Neck: Normal range of motion. Neck supple. No tracheal deviation present. No thyromegaly present.  Cardiovascular: Normal rate, normal heart sounds and intact distal pulses.   Pulmonary/Chest: Effort normal and breath sounds normal. No respiratory distress. He has no wheezes.  Abdominal: Hernia confirmed negative in the right inguinal area and confirmed negative in the left inguinal area.  Obese; protuberant abd; fair amount of suprapubic adipose tissue  Genitourinary:     Old rt groin incision; b/l atrophic testicles; no evidence of hernia supine/standing with valsalva maneuvers   Musculoskeletal: He exhibits no edema and no tenderness.  Shuffling gait  Lymphadenopathy:    He has no cervical adenopathy.       Right: No inguinal adenopathy present.       Left: No inguinal adenopathy present.  Neurological: He is alert and oriented to person, place, and time. He exhibits normal muscle tone.  Skin: Skin is warm and dry. No rash noted. He is not diaphoretic. No erythema. No pallor.  Psychiatric: He has a normal mood and affect. His  behavior is normal. Judgment and thought content normal.    Data Reviewed Dr Arnoldo Morale note  Assessment    Right groin pain     Plan    Despite different maneuvers on physical exam and positions I was unable to appreciate or detect any more hernia. His exam is a little bit challenging due to his  Body habitus. Nonetheless I still do not appreciate a bulge  or enlarged inguinal ring on exam. We discussed observation versus getting a CT scan of the pelvis to confirm my physical exam findings.  we discussed the possibility of an inguinal strain. We discussed management of an inguinal strain. He was given Neurosurgeon. I advised him to contact the office should his pain not resolve or if he notices a bulge or if it worsens. F/u prn  Leighton Ruff. Redmond Pulling, MD, FACS General, Bariatric, & Minimally Invasive Surgery [facility]        Gayland Curry 12/20/2013, 10:08 AM

## 2014-01-16 ENCOUNTER — Ambulatory Visit (INDEPENDENT_AMBULATORY_CARE_PROVIDER_SITE_OTHER): Payer: Medicare Other

## 2014-01-16 DIAGNOSIS — E538 Deficiency of other specified B group vitamins: Secondary | ICD-10-CM

## 2014-01-16 MED ORDER — CYANOCOBALAMIN 1000 MCG/ML IJ SOLN
1000.0000 ug | Freq: Once | INTRAMUSCULAR | Status: AC
  Administered 2014-01-16: 1000 ug via INTRAMUSCULAR

## 2014-01-31 ENCOUNTER — Telehealth: Payer: Self-pay | Admitting: Pulmonary Disease

## 2014-01-31 NOTE — Telephone Encounter (Signed)
Download has been placed in [facility] green folder.  Pt aware [name] not in this afternoon Please advise Nathan Cunningham thanks

## 2014-02-01 NOTE — Telephone Encounter (Signed)
Let pt know that his download shows great control of his sleep apnea. No changes recommended.

## 2014-02-04 NOTE — Telephone Encounter (Signed)
Called and spoke to pt regarding results and recs per [facility]. Pt verbalized understanding and denied any further questions or concerns at this time.

## 2014-02-06 ENCOUNTER — Ambulatory Visit (INDEPENDENT_AMBULATORY_CARE_PROVIDER_SITE_OTHER): Payer: Medicare Other | Admitting: Physician Assistant

## 2014-02-06 ENCOUNTER — Encounter: Payer: Self-pay | Admitting: Physician Assistant

## 2014-02-06 VITALS — BP 122/68 | HR 78 | Temp 98.1°F | Resp 18 | Wt 216.0 lb

## 2014-02-06 DIAGNOSIS — E538 Deficiency of other specified B group vitamins: Secondary | ICD-10-CM

## 2014-02-06 DIAGNOSIS — E1165 Type 2 diabetes mellitus with hyperglycemia: Secondary | ICD-10-CM

## 2014-02-06 DIAGNOSIS — IMO0001 Reserved for inherently not codable concepts without codable children: Secondary | ICD-10-CM

## 2014-02-06 DIAGNOSIS — K4090 Unilateral inguinal hernia, without obstruction or gangrene, not specified as recurrent: Secondary | ICD-10-CM

## 2014-02-06 MED ORDER — CYANOCOBALAMIN 1000 MCG/ML IJ SOLN
1000.0000 ug | Freq: Once | INTRAMUSCULAR | Status: AC
  Administered 2014-02-06: 1000 ug via INTRAMUSCULAR

## 2014-02-06 NOTE — Patient Instructions (Signed)
Continue all of your current medications as directed.  You will be able to get your A1c drawn at the [facility] office in about 1 month (around 03/09/14).  Keep your follow up plan with General Surgery regarding your hernia, should you have any concerns.  Please schedule an appointment to establish with new PCP, and have a Physical sometime in the next 3 months.  If emergency symptoms discussed during visit developed, seek medical attention immediately.  Followup as needed, or for worsening or persistent symptoms despite treatment.    Diabetes Mellitus and Food It is important for you to manage your blood sugar (glucose) level. Your blood glucose level can be greatly affected by what you eat. Eating healthier foods in the appropriate amounts throughout the day at about the same time each day will help you control your blood glucose level. It can also help slow or prevent worsening of your diabetes mellitus. Healthy eating may even help you improve the level of your blood pressure and reach or maintain a healthy weight.  HOW CAN FOOD AFFECT ME? Carbohydrates Carbohydrates affect your blood glucose level more than any other type of food. Your dietitian will help you determine how many carbohydrates to eat at each meal and teach you how to count carbohydrates. Counting carbohydrates is important to keep your blood glucose at a healthy level, especially if you are using insulin or taking certain medicines for diabetes mellitus. Alcohol Alcohol can cause sudden decreases in blood glucose (hypoglycemia), especially if you use insulin or take certain medicines for diabetes mellitus. Hypoglycemia can be a life-threatening condition. Symptoms of hypoglycemia (sleepiness, dizziness, and disorientation) are similar to symptoms of having too much alcohol.  If your health care provider has given you approval to drink alcohol, do so in moderation and use the following guidelines:  Women should not have more  than one drink per day, and men should not have more than two drinks per day. One drink is equal to:  12 oz of beer.  5 oz of wine.  1 oz of hard liquor.  Do not drink on an empty stomach.  Keep yourself hydrated. Have water, diet soda, or unsweetened iced tea.  Regular soda, juice, and other mixers might contain a lot of carbohydrates and should be counted. WHAT FOODS ARE NOT RECOMMENDED? As you make food choices, it is important to remember that all foods are not the same. Some foods have fewer nutrients per serving than other foods, even though they might have the same number of calories or carbohydrates. It is difficult to get your body what it needs when you eat foods with fewer nutrients. Examples of foods that you should avoid that are high in calories and carbohydrates but low in nutrients include:  Trans fats (most processed foods list trans fats on the Nutrition Facts label).  Regular soda.  Juice.  Candy.  Sweets, such as cake, pie, doughnuts, and cookies.  Fried foods. WHAT FOODS CAN I EAT? Have nutrient-rich foods, which will nourish your body and keep you healthy. The food you should eat also will depend on several factors, including:  The calories you need.  The medicines you take.  Your weight.  Your blood glucose level.  Your blood pressure level.  Your cholesterol level. You also should eat a variety of foods, including:  Protein, such as meat, poultry, fish, tofu, nuts, and seeds (lean animal proteins are best).  Fruits.  Vegetables.  Dairy products, such as milk, cheese, and yogurt (low fat  is best).  Breads, grains, pasta, cereal, rice, and beans.  Fats such as olive oil, trans fat-free margarine, canola oil, avocado, and olives. DOES EVERYONE WITH DIABETES MELLITUS HAVE THE SAME MEAL PLAN? Because every person with diabetes mellitus is different, there is not one meal plan that works for everyone. It is very important that you meet with a  dietitian who will help you create a meal plan that is just right for you. Document Released: 04/22/2005 Document Revised: 07/31/2013 Document Reviewed: 06/22/2013 [facility] Patient Information 2015 [place], [place]. This information is not intended to replace advice given to you by your health care provider. Make sure you discuss any questions you have with your health care provider.

## 2014-02-06 NOTE — Progress Notes (Signed)
Pre visit review using our clinic review tool, if applicable. No additional management support is needed unless otherwise documented below in the visit note. 

## 2014-02-06 NOTE — Progress Notes (Signed)
Subjective:    Patient ID: Nathan Cunningham, male    DOB: [birth year], 80 y.o.   MRN: [id number]  HPI Patient is an 79 year old Caucasian male presents to the clinic for two-month followup on diabetes, and hernia. Patient was seen approximately 2 months ago, and found to have a possible indirect inguinal hernia. He was referred to general surgery, who evaluated him and determined that the best course of action would probably be watchful waiting, and the patient will return there should his symptoms become more painful, or for bulge develops. Patient's A1c was shown 2 months ago and found to be 7.1% compared with 7.4% 7 months ago.  He is on treatment with glipizide and Kombiglyze, and states that he has tolerated these medications well, and denies any adverse effects on medication. He is currently asymptomatic, and denies fevers, chills, nausea, vomiting, diarrhea, shortness of breath, chest pain, headache, syncope.   Review of Systems As per history of present illness and are otherwise negative.    Past Medical History  Diagnosis Date  . Hypertension   . Diabetes mellitus   . Hyperlipidemia   . Macular degeneration     Right eye  . Constipation   . ETOH abuse     binge drinking  . Dizziness   . Diverticulosis   . Adenomatous polyp of colon 03/2003  . Hemorrhoids   . Sleep apnea     on CPAP    History   Social History  . Marital Status: Divorced    Spouse Name: N/A    Number of Children: 2  . Years of Education: N/A   Occupational History  . retired    Social History Main Topics  . Smoking status: Never Smoker   . Smokeless tobacco: Never Used  . Alcohol Use: 7.0 oz/week    14 drink(s) per week  . Drug Use: No  . Sexual Activity: Yes   Other Topics Concern  . Not on file   Social History Narrative  . No narrative on file    Past Surgical History  Procedure Laterality Date  . Appendectomy      age 54  . Cataract extraction      Left eye  . Inguinal  hernia repair      age 89  . Cevical fusion    . Cardiac defibrillator placement    . Tonsillectomy      Family History  Problem Relation Age of Onset  . Heart disease Father   . Stroke Father 46    No Known Allergies  Current Outpatient Prescriptions on File Prior to Visit  Medication Sig Dispense Refill  . amitriptyline (ELAVIL) 75 MG tablet Take 1 tablet (75 mg total) by mouth at bedtime.  90 tablet  3  . aspirin 325 MG tablet Take 162 mg by mouth daily.      . Chromium Picolinate (RA CHROMIUM PICOLINATE) 400 MCG TABS Take 1 tablet by mouth daily.        Marland Kitchen docusate sodium (COLACE) 100 MG capsule Take 100 mg by mouth 3 (three) times daily as needed.        . dorzolamide-timolol (COSOPT) 22.3-6.8 MG/ML ophthalmic solution 1 drop 2 (two) times daily.        . fish oil-omega-3 fatty acids 1000 MG capsule Take by mouth. Take six daily      . glipiZIDE (GLIPIZIDE XL) 5 MG 24 hr tablet Take 1 tablet (5 mg total) by mouth daily.  90 tablet  3  . Linaclotide (LINZESS) 145 MCG CAPS capsule Take 1 capsule (145 mcg total) by mouth daily.  30 capsule    . Magnesium-Calcium-Folic Acid (MAGNEBIND 161) 400-200-1 MG TABS Take 1 tablet by mouth daily.        . Melatonin 5 MG CAPS Take 1 each by mouth daily.        . Multiple Vitamins-Minerals (EYE-VITES) TABS Take 1 tablet by mouth 2 (two) times daily.        . Potassium Gluconate (K-99) 595 MG CAPS Take 1 each by mouth daily.        . Saxagliptin-Metformin (KOMBIGLYZE XR) 12-998 MG TB24 Take 1 each by mouth daily.  90 tablet  3  . telmisartan (MICARDIS) 80 MG tablet TAKE 1 TABLET DAILY  90 tablet  3   Current Facility-Administered Medications on File Prior to Visit  Medication Dose Route Frequency Provider Last Rate Last Dose  . cyanocobalamin ((VITAMIN B-12)) injection 1,000 mcg  1,000 mcg Intramuscular Q30 days Ricard Dillon, MD   1,000 mcg at 07/06/11 1148    EXAM: BP 122/68  Pulse 78  Temp(Src) 98.1 F (36.7 C) (Oral)  Resp 18  Wt  216 lb (97.977 kg)     Objective:   Physical Exam  Nursing note and vitals reviewed. Constitutional: He is oriented to person, place, and time. He appears well-developed and well-nourished. No distress.  HENT:  Head: Normocephalic and atraumatic.  Eyes: Conjunctivae and EOM are normal. Pupils are equal, round, and reactive to light.  Neck: Normal range of motion.  Cardiovascular: Normal rate, regular rhythm and intact distal pulses.   Pulmonary/Chest: Effort normal and breath sounds normal. No respiratory distress. He exhibits no tenderness.  Musculoskeletal: Normal range of motion. He exhibits no edema.  Neurological: He is alert and oriented to person, place, and time.  Skin: Skin is warm and dry. No rash noted. He is not diaphoretic. No erythema. No pallor.  Psychiatric: He has a normal mood and affect. His behavior is normal. Judgment and thought content normal.     Lab Results  Component Value Date   WBC 7.4 03/19/2013   HGB 12.4* 03/19/2013   HCT 37.9* 03/19/2013   PLT 180.0 03/19/2013   GLUCOSE 102* 12/07/2013   CHOL 148 06/10/2010   TRIG 181.0* 06/10/2010   HDL 35.20* 06/10/2010   LDLDIRECT 83.7 03/19/2013   LDLCALC 77 06/10/2010   ALT 16 12/07/2013   AST 14 12/07/2013   NA 136 12/07/2013   K 4.4 12/07/2013   CL 99 12/07/2013   CREATININE 0.8 12/07/2013   BUN 15 12/07/2013   CO2 30 12/07/2013   TSH 5.685* 02/28/2010   PSA 0.08* 03/25/2008   INR 1.59* 02/28/2010   HGBA1C 7.1* 12/07/2013   MICROALBUR 0.4 01/14/2009        Assessment & Plan:  [name] was seen today for 2 month follow up.  Diagnoses and associated orders for this visit:  Vitamin B 12 deficiency - cyanocobalamin ((VITAMIN B-12)) injection 1,000 mcg; Inject 1 mL (1,000 mcg total) into the muscle once.  Type II or unspecified type diabetes mellitus without mention of complication, uncontrolled Comments: Tolerating medicaitons well. Too soon for repeat A1c, will have drawn in 1 month. Continue current therapy. - Hemoglobin  A1c; Future  Indirect inguinal hernia Comments: Pt being managed by general surgery Dr. Redmond Pulling.    Patient is not due for A1c for one more month. Ordered A1c for the future, expected approximately 03/09/2014, patient will have  this done at [facility] office.  Patient will also schedule an established patient appointment with Dr. Danise Mina at the [facility] office, and get an annual physical set up.  Continue current medications as directed.  Pt will keep current follow up plan with general surgery for any hernia concerns.  Return precautions provided, and patient handout on diabetes and diet.  Plan to follow up as needed, or for worsening or persistent symptoms despite treatment.  Patient Instructions  Continue all of your current medications as directed.  You will be able to get your A1c drawn at the [facility] office in about 1 month (around 03/09/14).  Keep your follow up plan with General Surgery regarding your hernia, should you have any concerns.  Please schedule an appointment to establish with new PCP, and have a Physical sometime in the next 3 months.  If emergency symptoms discussed during visit developed, seek medical attention immediately.  Followup as needed, or for worsening or persistent symptoms despite treatment.

## 2014-02-28 ENCOUNTER — Telehealth: Payer: Self-pay | Admitting: Internal Medicine

## 2014-02-28 NOTE — Telephone Encounter (Signed)
FYI pt is calling to let dr Arnoldo Morale no dr Danise Mina is not accepting new pts. Pt has appt with dr [name]

## 2014-02-28 NOTE — Telephone Encounter (Signed)
Dr Arnoldo Morale aware

## 2014-03-14 ENCOUNTER — Telehealth: Payer: Self-pay | Admitting: Internal Medicine

## 2014-03-14 NOTE — Telephone Encounter (Signed)
Pt is concerned that dr [name] may not be dr Arnoldo Morale first choice as  a doctor for him. Pt wanted to speak w/ dr Arnoldo Morale about this and make sure this is ok with him.

## 2014-03-19 ENCOUNTER — Ambulatory Visit (INDEPENDENT_AMBULATORY_CARE_PROVIDER_SITE_OTHER): Payer: Medicare Other | Admitting: Family Medicine

## 2014-03-19 ENCOUNTER — Encounter: Payer: Self-pay | Admitting: Family Medicine

## 2014-03-19 VITALS — BP 136/74 | HR 91 | Temp 97.7°F | Ht 66.0 in | Wt 211.5 lb

## 2014-03-19 DIAGNOSIS — I4892 Unspecified atrial flutter: Secondary | ICD-10-CM

## 2014-03-19 DIAGNOSIS — R1030 Lower abdominal pain, unspecified: Secondary | ICD-10-CM | POA: Insufficient documentation

## 2014-03-19 DIAGNOSIS — R351 Nocturia: Secondary | ICD-10-CM | POA: Insufficient documentation

## 2014-03-19 DIAGNOSIS — I10 Essential (primary) hypertension: Secondary | ICD-10-CM

## 2014-03-19 DIAGNOSIS — R109 Unspecified abdominal pain: Secondary | ICD-10-CM

## 2014-03-19 DIAGNOSIS — E1149 Type 2 diabetes mellitus with other diabetic neurological complication: Secondary | ICD-10-CM

## 2014-03-19 DIAGNOSIS — Z125 Encounter for screening for malignant neoplasm of prostate: Secondary | ICD-10-CM

## 2014-03-19 DIAGNOSIS — R1031 Right lower quadrant pain: Secondary | ICD-10-CM

## 2014-03-19 DIAGNOSIS — E538 Deficiency of other specified B group vitamins: Secondary | ICD-10-CM

## 2014-03-19 DIAGNOSIS — E785 Hyperlipidemia, unspecified: Secondary | ICD-10-CM

## 2014-03-19 LAB — COMPREHENSIVE METABOLIC PANEL
ALK PHOS: 45 U/L (ref 39–117)
ALT: 15 U/L (ref 0–53)
AST: 14 U/L (ref 0–37)
Albumin: 3.4 g/dL — ABNORMAL LOW (ref 3.5–5.2)
BUN: 17 mg/dL (ref 6–23)
CO2: 27 mEq/L (ref 19–32)
Calcium: 9.2 mg/dL (ref 8.4–10.5)
Chloride: 99 mEq/L (ref 96–112)
Creatinine, Ser: 0.9 mg/dL (ref 0.4–1.5)
GFR: 90.68 mL/min (ref >60.00)
GLUCOSE: 187 mg/dL — AB (ref 70–99)
Potassium: 4.7 mEq/L (ref 3.5–5.1)
Sodium: 136 mEq/L (ref 135–145)
TOTAL PROTEIN: 7.1 g/dL (ref 6.0–8.3)
Total Bilirubin: 0.7 mg/dL (ref 0.2–1.2)

## 2014-03-19 LAB — POCT URINALYSIS DIPSTICK
Bilirubin, UA: NEGATIVE
Blood, UA: NEGATIVE
GLUCOSE UA: NEGATIVE
KETONES UA: NEGATIVE
Nitrite, UA: POSITIVE
Protein, UA: NEGATIVE
SPEC GRAV UA: 1.02
Urobilinogen, UA: 0.2
pH, UA: 6

## 2014-03-19 LAB — LDL CHOLESTEROL, DIRECT: Direct LDL: 90.7 mg/dL

## 2014-03-19 LAB — PSA, MEDICARE: PSA: 0.05 ng/ml — ABNORMAL LOW (ref 0.10–4.00)

## 2014-03-19 LAB — HEMOGLOBIN A1C: Hgb A1c MFr Bld: 7.4 % — ABNORMAL HIGH (ref 4.6–6.5)

## 2014-03-19 LAB — VITAMIN B12

## 2014-03-19 MED ORDER — CYANOCOBALAMIN 1000 MCG/ML IJ SOLN
1000.0000 ug | Freq: Once | INTRAMUSCULAR | Status: AC
  Administered 2014-03-19: 1000 ug via INTRAMUSCULAR

## 2014-03-19 MED ORDER — CYANOCOBALAMIN 1000 MCG/ML IJ SOLN: 1000.0000 ug | Freq: Once | INTRAMUSCULAR | Status: DC

## 2014-03-19 MED ORDER — TAMSULOSIN HCL 0.4 MG PO CAPS: 0.4000 mg | ORAL_CAPSULE | Freq: Every day | ORAL | Status: DC

## 2014-03-19 NOTE — Assessment & Plan Note (Signed)
New LUTs symptoms. Check UA to rule out infection. Plan prostate exam (DRE) at [facility] next month.  The natural history of prostate cancer and ongoing controversy regarding screening and potential treatment outcomes of prostate cancer has been discussed with the patient. The meaning of a false positive PSA and a false negative PSA has been discussed. He indicates understanding of the limitations of this screening test and wishes to proceed with screening PSA testing.  Start low dose flomax 0.4 mg daily.

## 2014-03-19 NOTE — Progress Notes (Signed)
Subjective:   Patient ID: Nathan Cunningham, male    DOB: 1924/09/23, 80 y.o.   MRN: [id number]  Nathan Cunningham is a pleasant 80 y.o. year old male who presents to clinic today with [place] and B12 Injection  on 03/19/2014  HPI: Has been seeing Dr. Arnoldo Morale.  Notes reviewed in chart.  Last saw Rodman Key, [place] for follow up visit.  DM- taking glipizide and Kombiglyze, and states that he has tolerated these medications well, and denies any adverse effects on medication. Has not had urine micro this year.  Denies any episodes of hypoglycemia.   Lab Results  Component Value Date   HGBA1C 7.1* 12/07/2013   Lab Results  Component Value Date   CHOL 148 06/10/2010   HDL 35.20* 06/10/2010   LDLCALC 77 06/10/2010   LDLDIRECT 83.7 03/19/2013   TRIG 181.0* 06/10/2010   CHOLHDL 4 06/10/2010    Vit B12 def- has been receiving monthly IM injections. Lab Results  Component Value Date   PNTIRWER15 400 08/14/2012   Right inguinal pain- daily for months, worse with certain movements.  Remote h/o right inguinal hernia repair and appendectomy (same procedure during childhood).  Saw Dr. Redmond Pulling (surg) on 12/20/2013- note reviewed.  They agreed on observation only for now but may consider MRI in future.  Nocturia- past few months waking up 5-7 times a night to urinate.  He has the urge but frequently very difficult to start his urinary stream or to completely empty his bladder.  No back pain.  No fevers. Current Outpatient Prescriptions on File Prior to Visit  Medication Sig Dispense Refill  . amitriptyline (ELAVIL) 75 MG tablet Take 1 tablet (75 mg total) by mouth at bedtime.  90 tablet  3  . aspirin 325 MG tablet Take 162 mg by mouth daily.      . Chromium Picolinate (RA CHROMIUM PICOLINATE) 400 MCG TABS Take 1 tablet by mouth daily.        Marland Kitchen docusate sodium (COLACE) 100 MG capsule Take 100 mg by mouth 3 (three) times daily as needed.        . dorzolamide-timolol (COSOPT) 22.3-6.8 MG/ML  ophthalmic solution 1 drop 2 (two) times daily.        . fish oil-omega-3 fatty acids 1000 MG capsule Take by mouth. Take six daily      . glipiZIDE (GLIPIZIDE XL) 5 MG 24 hr tablet Take 1 tablet (5 mg total) by mouth daily.  90 tablet  3  . Linaclotide (LINZESS) 145 MCG CAPS capsule Take 1 capsule (145 mcg total) by mouth daily.  30 capsule    . Magnesium-Calcium-Folic Acid (MAGNEBIND 867) 400-200-1 MG TABS Take 1 tablet by mouth daily.        . Melatonin 5 MG CAPS Take 1 each by mouth daily.        . Multiple Vitamins-Minerals (EYE-VITES) TABS Take 1 tablet by mouth 2 (two) times daily.        . Potassium Gluconate (K-99) 595 MG CAPS Take 1 each by mouth daily.        . Saxagliptin-Metformin (KOMBIGLYZE XR) 12-998 MG TB24 Take 1 each by mouth daily.  90 tablet  3  . telmisartan (MICARDIS) 80 MG tablet TAKE 1 TABLET DAILY  90 tablet  3   Current Facility-Administered Medications on File Prior to Visit  Medication Dose Route Frequency Provider Last Rate Last Dose  . cyanocobalamin ((VITAMIN B-12)) injection 1,000 mcg  1,000 mcg Intramuscular Q30 days Balinda Quails  Arnoldo Morale, MD   1,000 mcg at 07/06/11 1148    No Known Allergies  Past Medical History  Diagnosis Date  . Hypertension   . Diabetes mellitus   . Hyperlipidemia   . Macular degeneration     Right eye  . Constipation   . ETOH abuse     binge drinking  . Dizziness   . Diverticulosis   . Adenomatous polyp of colon 03/2003  . Hemorrhoids   . Sleep apnea     on CPAP    Past Surgical History  Procedure Laterality Date  . Appendectomy      age 66  . Cataract extraction      Left eye  . Inguinal hernia repair      age 81  . Cevical fusion    . Cardiac defibrillator placement    . Tonsillectomy      Family History  Problem Relation Age of Onset  . Heart disease Father   . Stroke Father 41    History   Social History  . Marital Status: Divorced    Spouse Name: N/A    Number of Children: 2  . Years of Education: N/A    Occupational History  . retired    Social History Main Topics  . Smoking status: Never Smoker   . Smokeless tobacco: Never Used  . Alcohol Use: 7.0 oz/week    14 drink(s) per week  . Drug Use: No  . Sexual Activity: No   Other Topics Concern  . Not on file   Social History Narrative  . No narrative on file   The PMH, PSH, Social History, Family History, Medications, and allergies have been reviewed in [facility], and have been updated if relevant.    Review of Systems See HPI No nausea or vomiting No fevers, night sweats or bone pain Denies weakness Denies hematuria Denies HA or dizziness    Objective:    BP 136/74  Pulse 91  Temp(Src) 97.7 F (36.5 C) (Oral)  Ht 5\' 6"  (1.676 m)  Wt 211 lb 8 oz (95.936 kg)  BMI 34.15 kg/m2  SpO2 93%   Physical Exam  Constitutional: He is oriented to person, place, and time. He appears well-developed and well-nourished. No distress.  HENT:  Head: Normocephalic and atraumatic.  Eyes: Conjunctivae and EOM are normal. Pupils are equal, round, and reactive to light.  Neck: Normal range of motion.  Cardiovascular: Normal rate, regular rhythm and intact distal pulses.  Pulmonary/Chest: Effort normal and breath sounds normal. No respiratory distress. He exhibits no tenderness.  Abd: obese, old surgical scar, NTTP No rebound or guarding Musculoskeletal: Normal range of motion. He exhibits no edema.  Neurological: He is alert and oriented to person, place, and time.  Skin: Skin is warm and dry. No rash noted. He is not diaphoretic. No erythema. No pallor.  Psychiatric: He has a normal mood and affect. His behavior is normal. Judgment and thought content normal.       Assessment & Plan:   DM W/NEURO MANIFESTATIONS, TYPE II  VITAMIN B12 DEFICIENCY  HYPERLIPIDEMIA  HYPERTENSION  Atrial flutter, unspecified No Follow-up on file.

## 2014-03-19 NOTE — Assessment & Plan Note (Signed)
IM B12 today. Also has not had B12 checked recently- will check this today.

## 2014-03-19 NOTE — Patient Instructions (Signed)
Great to meet you. I will call you with your lab results. We are starting flomax.  Please keep your appointment in 04/2014.

## 2014-03-19 NOTE — Assessment & Plan Note (Signed)
No recurrent arrhythmia.

## 2014-03-19 NOTE — Progress Notes (Signed)
Pre visit review using our clinic review tool, if applicable. No additional management support is needed unless otherwise documented below in the visit note. 

## 2014-03-19 NOTE — Addendum Note (Signed)
Addended by: Ellamae Sia on: 03/19/2014 04:25 PM   Modules accepted: Orders

## 2014-03-19 NOTE — Assessment & Plan Note (Signed)
Has been under good control. Over due for LDL- will check this today.  He is not on a statin. Will also check urine micro. Continue Kombiglyze and Glipizide at current doses. Orders Placed This Encounter  Procedures  . Vitamin B12  . PSA, Medicare  . Hemoglobin A1c  . LDL Cholesterol, Direct  . Microalbumin / creatinine urine ratio  . Comprehensive metabolic panel  . POCT UA - Microscopic Only

## 2014-03-19 NOTE — Assessment & Plan Note (Signed)
Persistent- he is ok with observation only at this point. If symptoms deteriorate, he will want to proceed with MRI.

## 2014-03-19 NOTE — Addendum Note (Signed)
Addended by: Marchia Bond on: 03/19/2014 04:34 PM   Modules accepted: Orders

## 2014-03-19 NOTE — Assessment & Plan Note (Signed)
Well controlled on current rx. No changes. 

## 2014-03-20 ENCOUNTER — Ambulatory Visit: Payer: Medicare Other | Admitting: Family Medicine

## 2014-03-20 LAB — MICROALBUMIN / CREATININE URINE RATIO
Creatinine,U: 87.6 mg/dL
MICROALB UR: 3 mg/dL — AB (ref 0.0–1.9)
Microalb Creat Ratio: 3.4 mg/g (ref 0.0–30.0)

## 2014-03-21 ENCOUNTER — Telehealth: Payer: Self-pay | Admitting: Family Medicine

## 2014-03-21 MED ORDER — CYANOCOBALAMIN 1000 MCG/ML IJ SOLN: INTRAMUSCULAR | Status: AC

## 2014-03-21 NOTE — Addendum Note (Signed)
Addended by: Modena Nunnery on: 03/21/2014 02:56 PM   Modules accepted: Orders

## 2014-03-21 NOTE — Telephone Encounter (Signed)
Spoke to pt and informed him of 03/19/14 results.

## 2014-03-22 ENCOUNTER — Other Ambulatory Visit: Payer: Self-pay | Admitting: Family Medicine

## 2014-03-22 ENCOUNTER — Telehealth: Payer: Self-pay | Admitting: Family Medicine

## 2014-03-22 LAB — URINE CULTURE: Colony Count: >=100000

## 2014-03-22 MED ORDER — SULFAMETHOXAZOLE-TMP DS 800-160 MG PO TABS: 1.0000 | ORAL_TABLET | Freq: Two times a day (BID) | ORAL | Status: AC

## 2014-03-22 NOTE — Telephone Encounter (Signed)
Spouse came in and wanted to know Should mr Mcarthy take his medication for frequent night time urination and medication for prostate infection at the same time.  Gave note to rena  i asked her if she wanted pt to leave or wait.  She wanted me to check with waynette to see if she wanted ms Casados wait or leave and call with answer.  Waynette read note and wanted me to tell ms Freeze yes take both medication.

## 2014-04-18 ENCOUNTER — Encounter: Payer: Medicare Other | Admitting: Family Medicine

## 2014-04-19 ENCOUNTER — Encounter: Payer: Self-pay | Admitting: Family Medicine

## 2014-04-19 ENCOUNTER — Ambulatory Visit (INDEPENDENT_AMBULATORY_CARE_PROVIDER_SITE_OTHER): Payer: Medicare Other | Admitting: Family Medicine

## 2014-04-19 VITALS — BP 132/74 | HR 100 | Temp 98.0°F | Ht 66.0 in | Wt 211.2 lb

## 2014-04-19 DIAGNOSIS — I10 Essential (primary) hypertension: Secondary | ICD-10-CM

## 2014-04-19 DIAGNOSIS — Z0000 Encounter for general adult medical examination without abnormal findings: Secondary | ICD-10-CM | POA: Insufficient documentation

## 2014-04-19 DIAGNOSIS — E785 Hyperlipidemia, unspecified: Secondary | ICD-10-CM

## 2014-04-19 DIAGNOSIS — E538 Deficiency of other specified B group vitamins: Secondary | ICD-10-CM

## 2014-04-19 DIAGNOSIS — Z23 Encounter for immunization: Secondary | ICD-10-CM

## 2014-04-19 DIAGNOSIS — E1149 Type 2 diabetes mellitus with other diabetic neurological complication: Secondary | ICD-10-CM

## 2014-04-19 DIAGNOSIS — R351 Nocturia: Secondary | ICD-10-CM

## 2014-04-19 DIAGNOSIS — R5381 Other malaise: Secondary | ICD-10-CM

## 2014-04-19 DIAGNOSIS — R5383 Other fatigue: Secondary | ICD-10-CM

## 2014-04-19 NOTE — Assessment & Plan Note (Signed)
Reasonable control. Follow up in 3 months.

## 2014-04-19 NOTE — Assessment & Plan Note (Signed)
At goal for diabetic. 

## 2014-04-19 NOTE — Assessment & Plan Note (Signed)
Improved s/p abx for UTI and starting flomax. Tolerating flomax well. No changes.

## 2014-04-19 NOTE — Assessment & Plan Note (Signed)
B12 high when checked last month. Advised receiving injections every other month. The patient indicates understanding of these issues and agrees with the plan.

## 2014-04-19 NOTE — Progress Notes (Signed)
Subjective:   Patient ID: Nathan Cunningham, male    DOB: 1925/05/21, 80 y.o.   MRN: [id number]  Nathan Cunningham is a pleasant 80 y.o. year old male who presents to clinic today with Annual Exam and Fatigue  on 04/19/2014  HPI: Established care with me one month ago.  I have personally reviewed the Medicare Annual Wellness questionnaire and have noted 1. The patient's medical and social history 2. Their use of alcohol, tobacco or illicit drugs 3. Their current medications and supplements 4. The patient's functional ability including ADL's, fall risks, home safety risks and hearing or visual             impairment. 5. Diet and physical activities 6. Evidence for depression or mood disorders  End of life wishes discussed and updated in Social History.  The roster of all physicians providing medical care to patient - is listed in the Snapshot section of the chart.  Pneumovax 08/09/98 Td 08/09/06 Colonoscopy 03/04/10   DM- taking glipizide and Kombiglyze, and states that he has tolerated these medications well, and denies any adverse effects on medication. Urine microalbumin pos last month.  Denies any episodes of hypoglycemia.    LDL at goal.  Eye exam 12/2013 (Dr. Edilia Bo).  Also sees him for MD. Lab Results  Component Value Date   HGBA1C 7.4* 03/19/2014   Lab Results  Component Value Date   CHOL 148 06/10/2010   HDL 35.20* 06/10/2010   LDLCALC 77 06/10/2010   LDLDIRECT 90.7 03/19/2014   TRIG 181.0* 06/10/2010   CHOLHDL 4 06/10/2010   Lab Results  Component Value Date   WBC 7.4 03/19/2013   HGB 12.4* 03/19/2013   HCT 37.9* 03/19/2013   MCV 95.6 03/19/2013   PLT 180.0 03/19/2013    Vit B12 def- has been receiving monthly IM injections. Lab Results  Component Value Date   VITAMINB12 >1500* 03/19/2014   Lab Results  Component Value Date   PSA 0.05* 03/19/2014   PSA 0.08* 03/25/2008   PSA 0.09* 03/23/2007    Right inguinal pain- daily for months, worse with certain movements.   Remote h/o right inguinal hernia repair and appendectomy (same procedure during childhood).  Saw Dr. Redmond Pulling (surg) on 12/20/2013- note reviewed.  They agreed on observation only for now but may consider MRI in future.  He feels this pain is improving.  Nocturia- started flomax last month and treated him for UTI.  Symptoms much improved.  Getting up less frequently to urinate at night.  Current Outpatient Prescriptions on File Prior to Visit  Medication Sig Dispense Refill  . amitriptyline (ELAVIL) 75 MG tablet Take 1 tablet (75 mg total) by mouth at bedtime.  90 tablet  3  . aspirin 325 MG tablet Take 162 mg by mouth daily.      . Chromium Picolinate (RA CHROMIUM PICOLINATE) 400 MCG TABS Take 1 tablet by mouth daily.        . cyanocobalamin (,VITAMIN B-12,) 1000 MCG/ML injection Inject 1066mcg every 60days IM (every other month)  1 mL  0  . docusate sodium (COLACE) 100 MG capsule Take 100 mg by mouth 3 (three) times daily as needed.        . dorzolamide-timolol (COSOPT) 22.3-6.8 MG/ML ophthalmic solution 1 drop 2 (two) times daily.        . fish oil-omega-3 fatty acids 1000 MG capsule Take by mouth. Take six daily      . glipiZIDE (GLIPIZIDE XL) 5 MG 24 hr tablet  Take 1 tablet (5 mg total) by mouth daily.  90 tablet  3  . Linaclotide (LINZESS) 145 MCG CAPS capsule Take 1 capsule (145 mcg total) by mouth daily.  30 capsule    . Magnesium-Calcium-Folic Acid (MAGNEBIND 638) 400-200-1 MG TABS Take 1 tablet by mouth daily.        . Melatonin 5 MG CAPS Take 1 each by mouth daily.        . Multiple Vitamins-Minerals (EYE-VITES) TABS Take 1 tablet by mouth 2 (two) times daily.        . Potassium Gluconate (K-99) 595 MG CAPS Take 1 each by mouth daily.        . Saxagliptin-Metformin (KOMBIGLYZE XR) 12-998 MG TB24 Take 1 each by mouth daily.  90 tablet  3  . tamsulosin (FLOMAX) 0.4 MG CAPS capsule Take 1 capsule (0.4 mg total) by mouth daily.  30 capsule  3  . telmisartan (MICARDIS) 80 MG tablet TAKE 1  TABLET DAILY  90 tablet  3   No current facility-administered medications on file prior to visit.    No Known Allergies  Past Medical History  Diagnosis Date  . Hypertension   . Diabetes mellitus   . Hyperlipidemia   . Macular degeneration     Right eye  . Constipation   . ETOH abuse     binge drinking  . Dizziness   . Diverticulosis   . Adenomatous polyp of colon 03/2003  . Hemorrhoids   . Sleep apnea     on CPAP    Past Surgical History  Procedure Laterality Date  . Appendectomy      age 68  . Cataract extraction      Left eye  . Inguinal hernia repair      age 61  . Cevical fusion    . Cardiac defibrillator placement    . Tonsillectomy      Family History  Problem Relation Age of Onset  . Heart disease Father   . Stroke Father 81    History   Social History  . Marital Status: Divorced    Spouse Name: N/A    Number of Children: 2  . Years of Education: N/A   Occupational History  . retired    Social History Main Topics  . Smoking status: Never Smoker   . Smokeless tobacco: Never Used  . Alcohol Use: 7.0 oz/week    14 drink(s) per week  . Drug Use: No  . Sexual Activity: No   Other Topics Concern  . Not on file   Social History Narrative   Retired from [employer]   Goes to [place] sneakers 3 times a week.   Has a girlfriend.   The PMH, PSH, Social History, Family History, Medications, and allergies have been reviewed in [facility], and have been updated if relevant.    Review of Systems See HPI No nausea or vomiting No fevers, night sweats or bone pain Denies weakness Denies hematuria Denies HA or dizziness +fatigue- chronic issue Denies feeling depressed or anxious Appetite good- weight stable Wt Readings from Last 3 Encounters:  04/19/14 211 lb 4 oz (95.822 kg)  03/19/14 211 lb 8 oz (95.936 kg)  02/06/14 216 lb (97.977 kg)   No recent fall but does use walker for balance    Objective:    BP 132/74  Pulse 100  Temp(Src) 98 F  (36.7 C) (Oral)  Ht 5\' 6"  (1.676 m)  Wt 211 lb 4 oz (  95.822 kg)  BMI 34.11 kg/m2  SpO2 94%   Physical Exam   Constitutional: He is oriented to person, place, and time. He appears well-developed and well-nourished. No distress.  HENT:  Head: Normocephalic and atraumatic.  Eyes: Conjunctivae and EOM are normal. Pupils are equal, round, and reactive to light.  Neck: Normal range of motion.  Cardiovascular: Normal rate, regular rhythm and intact distal pulses.  Pulmonary/Chest: Effort normal and breath sounds normal. No respiratory distress. He exhibits no tenderness.  Musculoskeletal: Normal range of motion. He exhibits no edema.  Neurological: He is alert and oriented to person, place, and time.  Skin: Skin is warm and dry. No rash noted. He is not diaphoretic. No erythema. No pallor.  Psychiatric: He has a normal mood and affect. His behavior is normal. Judgment and thought content normal.       Assessment & Plan:   Medicare annual wellness visit, subsequent  VITAMIN B12 DEFICIENCY  HYPERTENSION  HYPERLIPIDEMIA  DM W/NEURO MANIFESTATIONS, TYPE II  Other malaise and fatigue No Follow-up on file.

## 2014-04-19 NOTE — Patient Instructions (Addendum)
Great to see you.  Check with your insurance to see if they will cover the shingles shot.  Please come see me in 3 months.

## 2014-04-19 NOTE — Assessment & Plan Note (Signed)
The patients weight, height, BMI and visual acuity have been recorded in the chart I have made referrals, counseling and provided education to the patient based review of the above and I have provided the pt with a written personalized care plan for preventive services.  Prevnar 13 and influenza vaccine given today. He will call insurance company about coverage for zostavax.

## 2014-04-19 NOTE — Assessment & Plan Note (Signed)
Well controlled on current rx.

## 2014-04-22 ENCOUNTER — Telehealth: Payer: Self-pay

## 2014-04-22 MED ORDER — ZOSTER VACCINE LIVE 19400 UNT/0.65ML ~~LOC~~ SOLR: 0.6500 mL | Freq: Once | SUBCUTANEOUS | Status: DC

## 2014-04-22 NOTE — Telephone Encounter (Signed)
Pt request shingles vaccine prescription to [facility]. Pt has checked with ins co re; coverage. Pt does not require cb; pt will ck with [facility] in couple of days.

## 2014-04-22 NOTE — Telephone Encounter (Signed)
Rx sent 

## 2014-04-23 ENCOUNTER — Ambulatory Visit: Payer: Medicare Other | Admitting: Pulmonary Disease

## 2014-05-08 ENCOUNTER — Ambulatory Visit (INDEPENDENT_AMBULATORY_CARE_PROVIDER_SITE_OTHER): Payer: Medicare Other | Admitting: Pulmonary Disease

## 2014-05-08 ENCOUNTER — Encounter: Payer: Self-pay | Admitting: Pulmonary Disease

## 2014-05-08 VITALS — BP 120/62 | HR 70 | Temp 98.0°F | Ht 68.5 in | Wt 213.0 lb

## 2014-05-08 DIAGNOSIS — G4737 Central sleep apnea in conditions classified elsewhere: Secondary | ICD-10-CM

## 2014-05-08 DIAGNOSIS — G4733 Obstructive sleep apnea (adult) (pediatric): Secondary | ICD-10-CM

## 2014-05-08 DIAGNOSIS — G4731 Primary central sleep apnea: Secondary | ICD-10-CM

## 2014-05-08 NOTE — Assessment & Plan Note (Signed)
The patient is having persistent daytime sleepiness despite wearing his CPAP compliantly. This may simply be related to his nocturia or other sleep disruption, but I think we need to make sure that his pressure is adequately controlling his sleep apnea. I would like to get a download off his device for the last 6 months, and make sure that he is not having increased central apneas or excessive mask leak. I've also encouraged him to work aggressively on weight loss.

## 2014-05-08 NOTE — Patient Instructions (Signed)
Please bring your chip by to get a download.  Will then call you with results. Have your homecare company show you different masks next time you are due for a change followup with me again in one year if doing well with cpap.

## 2014-05-08 NOTE — Progress Notes (Signed)
   Subjective:    Patient ID: Nathan Cunningham, male    DOB: 1925/05/14, 80 y.o.   MRN: [id number]  HPI The patient comes in today for followup of his obstructive sleep apnea. He is wearing CPAP compliantly by his history, but continues to have some daytime sleepiness despite this. He was having awakening 7-8 times a night because of nocturia, but this has been reduced to 4 since being on Flomax. He is not having any issues with his pressure, although he does have to pull his mask fairly tight in order to seal adequately. He has been keeping up with his cushion changes. We did do a download earlier in the year that showed excellent control of his AHI.   Review of Systems  Constitutional: Negative for fever and unexpected weight change.  HENT: Negative for congestion, dental problem, ear pain, nosebleeds, postnasal drip, rhinorrhea, sinus pressure, sneezing, sore throat and trouble swallowing.   Eyes: Negative for redness and itching.  Respiratory: Negative for cough, chest tightness, shortness of breath and wheezing.   Cardiovascular: Negative for palpitations and leg swelling.  Gastrointestinal: Negative for nausea and vomiting.  Genitourinary: Negative for dysuria.  Musculoskeletal: Negative for joint swelling.  Skin: Negative for rash.  Neurological: Negative for headaches.  Hematological: Does not bruise/bleed easily.  Psychiatric/Behavioral: Negative for dysphoric mood. The patient is not nervous/anxious.        Objective:   Physical Exam Overweight male in no acute distress Nose without purulence or discharge noted No skin breakdown or pressure necrosis from the CPAP mask Neck without lymphadenopathy or thyromegaly Lower extremities with mild edema, no cyanosis Awake, but appears mildly sleepy, moves all 4 extremities.       Assessment & Plan:

## 2014-05-21 ENCOUNTER — Encounter: Payer: Self-pay | Admitting: Pulmonary Disease

## 2014-05-21 ENCOUNTER — Telehealth: Payer: Self-pay | Admitting: Pulmonary Disease

## 2014-05-21 NOTE — Telephone Encounter (Signed)
Patient  States that the reading is not accurate, he says he wears his CPAP every night, 8 hours per night.

## 2014-05-21 NOTE — Telephone Encounter (Signed)
I called spoke with pt. He will do so. notihng further needed

## 2014-05-21 NOTE — Telephone Encounter (Signed)
Please let pt know that his download shows that his machine is controlling his sleep apnea well, but he is only averaging 2 hrs a night with the machine.  Needs to wear everynight, all night, if he wants to sleep better

## 2014-05-21 NOTE — Telephone Encounter (Signed)
lmtcb 10/13

## 2014-05-21 NOTE — Telephone Encounter (Signed)
Then he needs to take his device into his dme to have them check machine function and make sure everything is ok.

## 2014-05-24 ENCOUNTER — Other Ambulatory Visit: Payer: Self-pay

## 2014-05-24 ENCOUNTER — Ambulatory Visit: Payer: Medicare Other | Admitting: Pulmonary Disease

## 2014-07-03 ENCOUNTER — Other Ambulatory Visit: Payer: Self-pay | Admitting: *Deleted

## 2014-07-03 MED ORDER — GLIPIZIDE ER 5 MG PO TB24: 5.0000 mg | ORAL_TABLET | Freq: Every day | ORAL | Status: DC

## 2014-07-10 ENCOUNTER — Telehealth: Payer: Self-pay | Admitting: Pulmonary Disease

## 2014-07-10 DIAGNOSIS — G4731 Primary central sleep apnea: Secondary | ICD-10-CM

## 2014-07-10 NOTE — Telephone Encounter (Signed)
I do not have any download on pt, nor does [facility] in his look at.  Pt reports he uses [facility]. I advised will get download and will call once results are reviewed.

## 2014-07-16 NOTE — Telephone Encounter (Signed)
Results still has not been received. Sent message to [place] to fax this over

## 2014-07-18 NOTE — Telephone Encounter (Signed)
Still have not received download. I have contacted [facility] again to get this sent over.

## 2014-07-18 NOTE — Telephone Encounter (Signed)
Results received and placed in [facility] look at. Please advise [employer] thanks

## 2014-07-19 NOTE — Telephone Encounter (Signed)
Results have been explained to patient, pt expressed understanding. Pt aware that order being placed for mask fit and decrease in pressure.   Nothing further needed.

## 2014-07-19 NOTE — Telephone Encounter (Signed)
Let pt know that his download shows that he is having a little bit of breakthru apnea that may be keeping him from being completely rested. Suspect this is due mask leak, but also is having more central apnea (brain forgetting to tell him to breathe) Needs to work on mask fit as we discussed, and will decrease pressure to 15. Please send an order to DME to work with him on mask fit, and decrease pressure to 15 Have pt call us in 4-6 weeks if he is not seeing improvement in his sleep.

## 2014-07-22 ENCOUNTER — Ambulatory Visit (INDEPENDENT_AMBULATORY_CARE_PROVIDER_SITE_OTHER): Payer: Medicare Other | Admitting: Family Medicine

## 2014-07-22 ENCOUNTER — Encounter: Payer: Self-pay | Admitting: Family Medicine

## 2014-07-22 VITALS — BP 118/70 | HR 82 | Temp 98.1°F | Wt 214.5 lb

## 2014-07-22 DIAGNOSIS — G4731 Primary central sleep apnea: Secondary | ICD-10-CM

## 2014-07-22 DIAGNOSIS — R351 Nocturia: Secondary | ICD-10-CM

## 2014-07-22 DIAGNOSIS — R5382 Chronic fatigue, unspecified: Secondary | ICD-10-CM

## 2014-07-22 DIAGNOSIS — G4737 Central sleep apnea in conditions classified elsewhere: Secondary | ICD-10-CM

## 2014-07-22 DIAGNOSIS — E538 Deficiency of other specified B group vitamins: Secondary | ICD-10-CM

## 2014-07-22 DIAGNOSIS — R5381 Other malaise: Secondary | ICD-10-CM

## 2014-07-22 DIAGNOSIS — G4739 Other sleep apnea: Secondary | ICD-10-CM

## 2014-07-22 DIAGNOSIS — G4733 Obstructive sleep apnea (adult) (pediatric): Secondary | ICD-10-CM

## 2014-07-22 DIAGNOSIS — E118 Type 2 diabetes mellitus with unspecified complications: Secondary | ICD-10-CM

## 2014-07-22 LAB — CBC WITH DIFFERENTIAL/PLATELET
BASOS ABS: 0 10*3/uL (ref 0.0–0.1)
Basophils Relative: 1 % (ref 0.0–3.0)
Eosinophils Absolute: 0 10*3/uL (ref 0.0–0.7)
Eosinophils Relative: 1 % (ref 0.0–5.0)
HCT: 37.9 % — ABNORMAL LOW (ref 39.0–52.0)
Hemoglobin: 12.2 g/dL — ABNORMAL LOW (ref 13.0–17.0)
LYMPHS PCT: 38 % (ref 12.0–46.0)
Lymphs Abs: 2.3 10*3/uL (ref 0.7–4.0)
MCHC: 32.2 g/dL (ref 30.0–36.0)
MCV: 94.9 fl (ref 78.0–100.0)
Monocytes Absolute: 2.2 10*3/uL — ABNORMAL HIGH (ref 0.1–1.0)
Neutro Abs: 2.3 10*3/uL (ref 1.4–7.7)
Neutrophils Relative %: 37 % — ABNORMAL LOW (ref 43.0–77.0)
PLATELETS: 173 10*3/uL (ref 150.0–400.0)
RBC: 4 Mil/uL — ABNORMAL LOW (ref 4.22–5.81)
RDW: 13.9 % (ref 11.5–15.5)
WBC: 6.9 10*3/uL (ref 4.0–10.5)

## 2014-07-22 LAB — COMPREHENSIVE METABOLIC PANEL
ALT: 14 U/L (ref 0–53)
AST: 15 U/L (ref 0–37)
Albumin: 3.7 g/dL (ref 3.5–5.2)
Alkaline Phosphatase: 48 U/L (ref 39–117)
BILIRUBIN TOTAL: 0.7 mg/dL (ref 0.2–1.2)
BUN: 15 mg/dL (ref 6–23)
CALCIUM: 9.4 mg/dL (ref 8.4–10.5)
CHLORIDE: 100 meq/L (ref 96–112)
CO2: 29 meq/L (ref 19–32)
Creatinine, Ser: 0.7 mg/dL (ref 0.4–1.5)
GFR: 109.73 mL/min (ref >60.00)
Glucose, Bld: 128 mg/dL — ABNORMAL HIGH (ref 70–99)
Potassium: 4.7 mEq/L (ref 3.5–5.1)
SODIUM: 136 meq/L (ref 135–145)
Total Protein: 7.4 g/dL (ref 6.0–8.3)

## 2014-07-22 LAB — VITAMIN B12: Vitamin B-12: >1500 pg/mL — ABNORMAL HIGH (ref 211–911)

## 2014-07-22 LAB — HEMOGLOBIN A1C: Hgb A1c MFr Bld: 7.7 % — ABNORMAL HIGH (ref 4.6–6.5)

## 2014-07-22 LAB — TSH: TSH: 4.49 u[IU]/mL (ref 0.35–4.50)

## 2014-07-22 MED ORDER — TAMSULOSIN HCL 0.4 MG PO CAPS: 0.4000 mg | ORAL_CAPSULE | Freq: Every day | ORAL | Status: DC

## 2014-07-22 MED ORDER — TADALAFIL 5 MG PO TABS: 5.0000 mg | ORAL_TABLET | Freq: Every day | ORAL | Status: DC | PRN

## 2014-07-22 MED ORDER — CYANOCOBALAMIN 1000 MCG/ML IJ SOLN
1000.0000 ug | Freq: Once | INTRAMUSCULAR | Status: AC
  Administered 2014-07-22: 1000 ug via INTRAMUSCULAR

## 2014-07-22 NOTE — Assessment & Plan Note (Signed)
Likely contributing to fatigue- see note under chronic fatigue and malaise.

## 2014-07-22 NOTE — Progress Notes (Signed)
Subjective:   Patient ID: Nathan Cunningham, male    DOB: 1925-03-07, 80 y.o.   MRN: [id number]  REQUAN Cunningham is a pleasant 80 y.o. year old male who presents to clinic today with Follow-up  on 07/22/2014  HPI:   DM- taking glipizide and Kombiglyze, and states that he has tolerated these medications well, and denies any adverse effects on medication but he is concerned about cost. Urine microalbumin pos in 03/2014. Denies any episodes of hypoglycemia.    LDL at goal.  Eye exam 12/2013 (Dr. Edilia Bo).  Also sees him for MD. Lab Results  Component Value Date   HGBA1C 7.4* 03/19/2014   Lab Results  Component Value Date   CHOL 148 06/10/2010   HDL 35.20* 06/10/2010   LDLCALC 77 06/10/2010   LDLDIRECT 90.7 03/19/2014   TRIG 181.0* 06/10/2010   CHOLHDL 4 06/10/2010   Lab Results  Component Value Date   WBC 7.4 03/19/2013   HGB 12.4* 03/19/2013   HCT 37.9* 03/19/2013   MCV 95.6 03/19/2013   PLT 180.0 03/19/2013    Vit B12 def- had been receiving monthly IM injections but since his B12 was >1500 when I checked it in 03/2014, changed schedule of B12 injections to every other month.  Due for one today. Lab Results  Component Value Date   VITAMINB12 >1500* 03/19/2014   Lab Results  Component Value Date   PSA 0.05* 03/19/2014   PSA 0.08* 03/25/2008   PSA 0.09* 03/23/2007    Nocturia- started flomax when he established with me, and treated him for UTI.  Symptoms much improved.  Getting up less much frequently to urinate at night.  Does not feel he has any side effects from it and would like to continue taking it.  ED- had taken viagra years ago and was not very effective.  Had better results with cialis and would like to try this again.  Current Outpatient Prescriptions on File Prior to Visit  Medication Sig Dispense Refill  . amitriptyline (ELAVIL) 75 MG tablet Take 1 tablet (75 mg total) by mouth at bedtime. 90 tablet 3  . aspirin 325 MG tablet Take 162 mg by mouth daily.     . Chromium Picolinate (RA CHROMIUM PICOLINATE) 400 MCG TABS Take 1 tablet by mouth daily.      . cyanocobalamin (,VITAMIN B-12,) 1000 MCG/ML injection Inject 1059mcg every 60days IM (every other month) 1 mL 0  . docusate sodium (COLACE) 100 MG capsule Take 100 mg by mouth 3 (three) times daily as needed.      . dorzolamide-timolol (COSOPT) 22.3-6.8 MG/ML ophthalmic solution 1 drop 2 (two) times daily.      . fish oil-omega-3 fatty acids 1000 MG capsule Take by mouth. Take six daily    . glipiZIDE (GLIPIZIDE XL) 5 MG 24 hr tablet Take 1 tablet (5 mg total) by mouth daily. 90 tablet 1  . Linaclotide (LINZESS) 145 MCG CAPS capsule Take 1 capsule (145 mcg total) by mouth daily. 30 capsule   . Magnesium-Calcium-Folic Acid (MAGNEBIND 672) 400-200-1 MG TABS Take 1 tablet by mouth daily.      . Melatonin 5 MG CAPS Take 1 each by mouth daily.      . Multiple Vitamins-Minerals (EYE-VITES) TABS Take 1 tablet by mouth 2 (two) times daily.      . Potassium Gluconate (K-99) 595 MG CAPS Take 1 each by mouth daily.      . Saxagliptin-Metformin (KOMBIGLYZE XR) 12-998 MG TB24 Take 1 each  by mouth daily. 90 tablet 3  . telmisartan (MICARDIS) 80 MG tablet TAKE 1 TABLET DAILY 90 tablet 3  . zoster vaccine live, PF, (ZOSTAVAX) 00712 UNT/0.65ML injection Inject 19,400 Units into the skin once. 1 each 0   No current facility-administered medications on file prior to visit.    No Known Allergies  Past Medical History  Diagnosis Date  . Hypertension   . Diabetes mellitus   . Hyperlipidemia   . Macular degeneration     Right eye  . Constipation   . ETOH abuse     binge drinking  . Dizziness   . Diverticulosis   . Adenomatous polyp of colon 03/2003  . Hemorrhoids   . Sleep apnea     on CPAP    Past Surgical History  Procedure Laterality Date  . Appendectomy      age 80  . Cataract extraction      Left eye  . Inguinal hernia repair      age 28  . Cevical fusion    . Cardiac defibrillator placement     . Tonsillectomy      Family History  Problem Relation Age of Onset  . Heart disease Father   . Stroke Father 40    History   Social History  . Marital Status: Divorced    Spouse Name: N/A    Number of Children: 2  . Years of Education: N/A   Occupational History  . retired    Social History Main Topics  . Smoking status: Never Smoker   . Smokeless tobacco: Never Used  . Alcohol Use: 7.0 oz/week    14 drink(s) per week  . Drug Use: No  . Sexual Activity: No   Other Topics Concern  . Not on file   Social History Narrative   Retired from [employer]   Goes to [place] sneakers 3 times a week.   Has a girlfriend.   Does not have a living will yet.   Desires CPR, does not prolonged life support if futile.         The PMH, PSH, Social History, Family History, Medications, and allergies have been reviewed in [facility], and have been updated if relevant.    Review of Systems See HPI No nausea or vomiting No fevers, night sweats or bone pain Denies weakness Denies hematuria Denies HA or dizziness Denies feeling depressed or anxious Appetite good- weight remains stable Wt Readings from Last 3 Encounters:  07/22/14 214 lb 8 oz (97.297 kg)  05/08/14 213 lb (96.616 kg)  04/19/14 211 lb 4 oz (95.822 kg)   No recent fall but does use walker for balance- feels  + fatigue- feels this is becoming a chronic issue- no other symptoms other than he is always tired    Objective:    BP 118/70 mmHg  Pulse 82  Temp(Src) 98.1 F (36.7 C) (Oral)  Wt 214 lb 8 oz (97.297 kg)  SpO2 95%   Physical Exam   Constitutional: He is oriented to person, place, and time. He appears well-developed and well-nourished. No distress.  HENT:  Head: Normocephalic and atraumatic.  Eyes: Conjunctivae and EOM are normal. Pupils are equal, round, and reactive to light.  Neck: Normal range of motion.  Cardiovascular: Normal rate, regular rhythm and intact distal pulses.  Pulmonary/Chest:  Effort normal and breath sounds normal. No respiratory distress. He exhibits no tenderness.  Musculoskeletal: Normal range of motion. He exhibits no edema.  Neurological: He is  alert and oriented to person, place, and time.  Skin: Skin is warm and dry. No rash noted. He is not diaphoretic. No erythema. No pallor.  Psychiatric: He has a normal mood and affect. His behavior is normal. Judgment and thought content normal.       Assessment & Plan:   Chronic fatigue and malaise - Plan: CBC with Differential, Comprehensive metabolic panel, TSH  Nocturia  B12 deficiency - Plan: Vitamin B12, cyanocobalamin ((VITAMIN B-12)) injection 1,000 mcg  Type 2 diabetes mellitus with complication - Plan: Hemoglobin A1c No Follow-up on file.

## 2014-07-22 NOTE — Assessment & Plan Note (Signed)
Improved with flomax. eRx refills sent to mail order.

## 2014-07-22 NOTE — Assessment & Plan Note (Signed)
Persistent issue, likely multifactorial. Looked through chart- poorly fitting CPAP mask may be part of the issue- telephone note from 1 1/2 weeks ago from Dr. Gwenette Greet-  Let pt know that his download shows that he is having a little bit of breakthru apnea that may be keeping him from being completely rested. Suspect this is due mask leak, but also is having more central apnea (brain forgetting to tell him to breathe) Needs to work on mask fit as we discussed, and will decrease pressure to 15. Please send an order to DME to work with him on mask fit, and decrease pressure to 15 Have pt call us in 4-6 weeks if he is not seeing improvement in his sleep.   He is aware and it is being refitted. Check labs today as well that can contribute to fatigue. Orders Placed This Encounter  Procedures  . CBC with Differential  . Comprehensive metabolic panel  . Hemoglobin A1c  . TSH  . Vitamin B12

## 2014-07-22 NOTE — Assessment & Plan Note (Signed)
Due for a1c and CMET today. Received prevnar 13 in 04/2014.

## 2014-07-22 NOTE — Patient Instructions (Signed)
Great to see you. Happy holidays. Let me know how Cialis works. We will call you with your lab results.

## 2014-07-22 NOTE — Assessment & Plan Note (Signed)
Im B12 given today - will also check serum B12 today.

## 2014-07-26 ENCOUNTER — Other Ambulatory Visit: Payer: Self-pay | Admitting: Family Medicine

## 2014-07-26 DIAGNOSIS — R7989 Other specified abnormal findings of blood chemistry: Secondary | ICD-10-CM

## 2014-09-10 ENCOUNTER — Ambulatory Visit: Payer: Self-pay | Admitting: Internal Medicine

## 2014-09-10 ENCOUNTER — Encounter: Payer: Self-pay | Admitting: Internal Medicine

## 2014-09-10 LAB — CBC CANCER CENTER
BASOS ABS: 0 x10 3/mm (ref 0.0–0.1)
BASOS PCT: 0.4 %
COMMENT - H1-COM1: NORMAL
COMMENT - H1-COM2: NORMAL
EOS PCT: 0.5 %
Eosinophil #: 0 x10 3/mm (ref 0.0–0.7)
HCT: 36.8 % — AB (ref 40.0–52.0)
HGB: 12.2 g/dL — ABNORMAL LOW (ref 13.0–18.0)
LYMPHS ABS: 2.2 x10 3/mm (ref 1.0–3.6)
Lymphocyte %: 30.3 %
Lymphocytes: 35 %
MCH: 31 pg (ref 26.0–34.0)
MCHC: 33.3 g/dL (ref 32.0–36.0)
MCV: 93 fL (ref 80–100)
MONOS PCT: 31 %
Monocyte #: 2.2 x10 3/mm — ABNORMAL HIGH (ref 0.2–1.0)
Monocytes: 15 %
NEUTROS ABS: 2.7 x10 3/mm (ref 1.4–6.5)
Neutrophil %: 37.8 %
Platelet: 162 x10 3/mm (ref 150–440)
RBC: 3.95 10*6/uL — AB (ref 4.40–5.90)
RDW: 14.5 % (ref 11.5–14.5)
Segmented Neutrophils: 50 %
WBC: 7.1 x10 3/mm (ref 3.8–10.6)

## 2014-09-10 LAB — FOLATE: FOLIC ACID: 25.2 ng/mL — AB (ref 3.1–17.5)

## 2014-09-10 LAB — IRON AND TIBC
IRON BIND. CAP.(TOTAL): 325 ug/dL (ref 250–450)
IRON SATURATION: 23 %
Iron: 75 ug/dL (ref 65–175)
UNBOUND IRON-BIND. CAP.: 250 ug/dL

## 2014-09-11 LAB — PROT IMMUNOELECTROPHORES(ARMC)

## 2014-10-08 ENCOUNTER — Ambulatory Visit
Admit: 2014-10-08 | Disposition: A | Discharge status: Home / Self Care | Payer: Self-pay | Attending: Internal Medicine | Admitting: Internal Medicine

## 2014-10-17 ENCOUNTER — Other Ambulatory Visit: Payer: Self-pay | Admitting: *Deleted

## 2014-10-17 DIAGNOSIS — M792 Neuralgia and neuritis, unspecified: Secondary | ICD-10-CM

## 2014-10-17 MED ORDER — AMITRIPTYLINE HCL 75 MG PO TABS: 75.0000 mg | ORAL_TABLET | Freq: Every day | ORAL | Status: DC

## 2014-10-17 MED ORDER — TAMSULOSIN HCL 0.4 MG PO CAPS: 0.4000 mg | ORAL_CAPSULE | Freq: Every day | ORAL | Status: DC

## 2014-10-17 MED ORDER — GLIPIZIDE ER 5 MG PO TB24: 5.0000 mg | ORAL_TABLET | Freq: Every day | ORAL | Status: DC

## 2014-10-17 NOTE — Addendum Note (Signed)
Addended by: Modena Nunnery on: 10/17/2014 12:35 PM   Modules accepted: Orders

## 2014-10-21 ENCOUNTER — Ambulatory Visit (INDEPENDENT_AMBULATORY_CARE_PROVIDER_SITE_OTHER): Payer: Medicare Other | Admitting: Family Medicine

## 2014-10-21 ENCOUNTER — Encounter: Payer: Self-pay | Admitting: Family Medicine

## 2014-10-21 VITALS — BP 124/70 | HR 80 | Temp 98.2°F | Wt 214.0 lb

## 2014-10-21 DIAGNOSIS — R7989 Other specified abnormal findings of blood chemistry: Secondary | ICD-10-CM | POA: Insufficient documentation

## 2014-10-21 DIAGNOSIS — I10 Essential (primary) hypertension: Secondary | ICD-10-CM

## 2014-10-21 DIAGNOSIS — Z0289 Encounter for other administrative examinations: Secondary | ICD-10-CM | POA: Insufficient documentation

## 2014-10-21 DIAGNOSIS — E538 Deficiency of other specified B group vitamins: Secondary | ICD-10-CM

## 2014-10-21 DIAGNOSIS — E118 Type 2 diabetes mellitus with unspecified complications: Secondary | ICD-10-CM

## 2014-10-21 LAB — COMPREHENSIVE METABOLIC PANEL
ALK PHOS: 53 U/L (ref 39–117)
ALT: 17 U/L (ref 0–53)
AST: 14 U/L (ref 0–37)
Albumin: 3.8 g/dL (ref 3.5–5.2)
BILIRUBIN TOTAL: 0.5 mg/dL (ref 0.2–1.2)
BUN: 17 mg/dL (ref 6–23)
CO2: 32 mEq/L (ref 19–32)
Calcium: 9.3 mg/dL (ref 8.4–10.5)
Chloride: 99 mEq/L (ref 96–112)
Creatinine, Ser: 0.77 mg/dL (ref 0.40–1.50)
GFR: 101.49 mL/min (ref >60.00)
Glucose, Bld: 154 mg/dL — ABNORMAL HIGH (ref 70–99)
POTASSIUM: 5 meq/L (ref 3.5–5.1)
Sodium: 133 mEq/L — ABNORMAL LOW (ref 135–145)
Total Protein: 7.4 g/dL (ref 6.0–8.3)

## 2014-10-21 LAB — HEMOGLOBIN A1C: Hgb A1c MFr Bld: 7.5 % — ABNORMAL HIGH (ref 4.6–6.5)

## 2014-10-21 LAB — VITAMIN B12: Vitamin B-12: >1500 pg/mL — ABNORMAL HIGH (ref 211–911)

## 2014-10-21 MED ORDER — CYANOCOBALAMIN 1000 MCG/ML IJ SOLN
1000.0000 ug | Freq: Once | INTRAMUSCULAR | Status: AC
  Administered 2014-10-21: 1000 ug via INTRAMUSCULAR

## 2014-10-21 NOTE — Progress Notes (Signed)
Subjective:   Patient ID: Nathan Cunningham, male    DOB: 1925/07/24, 81 y.o.   MRN: [id number]  Nathan Cunningham is a pleasant 81 y.o. year old male who presents to clinic today with Follow-up  on 10/21/2014  HPI:   DM- taking glipizide and Kombiglyze, and states that he has tolerated these medications well, and denies any adverse effects on medication but a1c was elevated 3 months ago. Does not check FSBS regularly.  Denies any symptoms of hypoglycemia.  Urine microalbumin pos in 03/2014. Denies any episodes of hypoglycemia.    LDL at goal.  Eye exam 12/2013 (Dr. Edilia Bo).  Also sees him for MD. Prevnar 13 received. LDL has been at goal.  Lab Results  Component Value Date   HGBA1C 7.7* 07/22/2014   Lab Results  Component Value Date   CHOL 148 06/10/2010   HDL 35.20* 06/10/2010   LDLCALC 77 06/10/2010   LDLDIRECT 90.7 03/19/2014   TRIG 181.0* 06/10/2010   CHOLHDL 4 06/10/2010   Lab Results  Component Value Date   WBC 6.9 07/22/2014   HGB 12.2* 07/22/2014   HCT 37.9* 07/22/2014   MCV 94.9 07/22/2014   PLT 173.0 07/22/2014    Vit B12 def- had been receiving monthly IM injections but since his B12 was >1500 when I checked it in 03/2014, changed schedule of B12 injections to every other month.  Due for one today. Lab Results  Component Value Date   VITAMINB12 >1500* 07/22/2014   Lab Results  Component Value Date   PSA 0.05* 03/19/2014   PSA 0.08* 03/25/2008   PSA 0.09* 03/23/2007    Abnormal CBC- referred him to hematology- had bone marrow biopsy.  Follow up 3/18 scheduled to discuss results- we do not have these yet. Lab Results  Component Value Date   WBC 6.9 07/22/2014   HGB 12.2* 07/22/2014   HCT 37.9* 07/22/2014   MCV 94.9 07/22/2014   PLT 173.0 07/22/2014    He also bring in a DMV form for me to review and complete- "medical review." Current Outpatient Prescriptions on File Prior to Visit  Medication Sig Dispense Refill  . amitriptyline (ELAVIL) 75 MG  tablet Take 1 tablet (75 mg total) by mouth at bedtime. 90 tablet 1  . aspirin 325 MG tablet Take 162 mg by mouth daily.    . Chromium Picolinate (RA CHROMIUM PICOLINATE) 400 MCG TABS Take 1 tablet by mouth daily.      . cyanocobalamin (,VITAMIN B-12,) 1000 MCG/ML injection Inject 1034mg every 60days IM (every other month) 1 mL 0  . docusate sodium (COLACE) 100 MG capsule Take 100 mg by mouth 3 (three) times daily as needed.      . dorzolamide-timolol (COSOPT) 22.3-6.8 MG/ML ophthalmic solution 1 drop 2 (two) times daily.      . fish oil-omega-3 fatty acids 1000 MG capsule Take by mouth. Take six daily    . glipiZIDE (GLIPIZIDE XL) 5 MG 24 hr tablet Take 1 tablet (5 mg total) by mouth daily. 90 tablet 0  . Linaclotide (LINZESS) 145 MCG CAPS capsule Take 1 capsule (145 mcg total) by mouth daily. 30 capsule   . Magnesium-Calcium-Folic Acid (MAGNEBIND 4825 400-200-1 MG TABS Take 1 tablet by mouth daily.      . Melatonin 5 MG CAPS Take 1 each by mouth daily.      . Multiple Vitamins-Minerals (EYE-VITES) TABS Take 1 tablet by mouth 2 (two) times daily.      . Potassium Gluconate (K-99)  595 MG CAPS Take 1 each by mouth daily.      . Saxagliptin-Metformin (KOMBIGLYZE XR) 12-998 MG TB24 Take 1 each by mouth daily. 90 tablet 3  . tadalafil (CIALIS) 5 MG tablet Take 1 tablet (5 mg total) by mouth daily as needed for erectile dysfunction. 10 tablet 0  . tamsulosin (FLOMAX) 0.4 MG CAPS capsule Take 1 capsule (0.4 mg total) by mouth daily. 90 capsule 3  . telmisartan (MICARDIS) 80 MG tablet TAKE 1 TABLET DAILY 90 tablet 3  . zoster vaccine live, PF, (ZOSTAVAX) 18590 UNT/0.65ML injection Inject 19,400 Units into the skin once. 1 each 0   No current facility-administered medications on file prior to visit.    No Known Allergies  Past Medical History  Diagnosis Date  . Hypertension   . Diabetes mellitus   . Hyperlipidemia   . Macular degeneration     Right eye  . Constipation   . ETOH abuse      binge drinking  . Dizziness   . Diverticulosis   . Adenomatous polyp of colon 03/2003  . Hemorrhoids   . Sleep apnea     on CPAP    Past Surgical History  Procedure Laterality Date  . Appendectomy      age 38  . Cataract extraction      Left eye  . Inguinal hernia repair      age 54  . Cevical fusion    . Cardiac defibrillator placement    . Tonsillectomy      Family History  Problem Relation Age of Onset  . Heart disease Father   . Stroke Father 11    History   Social History  . Marital Status: Unknown    Spouse Name: N/A  . Number of Children: 2  . Years of Education: N/A   Occupational History  . retired    Social History Main Topics  . Smoking status: Never Smoker   . Smokeless tobacco: Never Used  . Alcohol Use: 7.0 oz/week    14 drink(s) per week  . Drug Use: No  . Sexual Activity: No   Other Topics Concern  . Not on file   Social History Narrative   Retired from [employer]   Goes to [place] sneakers 3 times a week.   Has a girlfriend.   Does not have a living will yet.   Desires CPR, does not prolonged life support if futile.         The PMH, PSH, Social History, Family History, Medications, and allergies have been reviewed in [facility], and have been updated if relevant.    Review of Systems See HPI  No fevers, night sweats or bone pain Denies weakness Denies hematuria Denies HA or dizziness Denies feeling depressed or anxious Appetite good- weight remains stable Wt Readings from Last 3 Encounters:  10/21/14 214 lb (97.07 kg)  07/22/14 214 lb 8 oz (97.297 kg)  05/08/14 213 lb (96.616 kg)  Denies HA or blurred vision Remains fatigued     Objective:    BP 124/70 mmHg  Pulse 80  Temp(Src) 98.2 F (36.8 C) (Oral)  Wt 214 lb (97.07 kg)  SpO2 96%   Physical Exam   Constitutional: He is oriented to person, place, and time. He appears well-developed and well-nourished. No distress.  HENT:  Head: Normocephalic and atraumatic.    Eyes: Conjunctivae and EOM are normal. Pupils are equal, round, and reactive to light.  Neck: Normal range of motion.  Cardiovascular: Normal rate, regular rhythm and intact distal pulses.  Pulmonary/Chest: Effort normal and breath sounds normal. No respiratory distress. He exhibits no tenderness.  Musculoskeletal: Normal range of motion. He exhibits no edema.  Neurological: He is alert and oriented to person, place, and time.  Skin: Skin is warm and dry. No rash noted. He is not diaphoretic. No erythema. No pallor.  Psychiatric: He has a normal mood and affect. His behavior is normal. Judgment and thought content normal.       Assessment & Plan:   Type 2 diabetes mellitus with complication - Plan: Hemoglobin A1c, Comprehensive metabolic panel  Y34 deficiency - Plan: Vitamin B12  Essential hypertension  Abnormal CBC No Follow-up on file.

## 2014-10-21 NOTE — Assessment & Plan Note (Signed)
Awaiting records from hematology. He should have results of bone marrow biopsy soon.

## 2014-10-21 NOTE — Assessment & Plan Note (Signed)
Deteriorated. Due for a1c today.

## 2014-10-21 NOTE — Addendum Note (Signed)
Addended by: Modena Nunnery on: 10/21/2014 12:33 PM   Modules accepted: Orders

## 2014-10-21 NOTE — Assessment & Plan Note (Signed)
Form reviewed and sections completed.  Will finish forms and call pt when completed to pick up and take to his eye doctor. The patient indicates understanding of these issues and agrees with the plan.

## 2014-10-21 NOTE — Progress Notes (Signed)
Pre visit review using our clinic review tool, if applicable. No additional management support is needed unless otherwise documented below in the visit note. 

## 2014-10-21 NOTE — Assessment & Plan Note (Signed)
IM B12 given today. 

## 2014-10-21 NOTE — Patient Instructions (Signed)
Great to see you. We will call you with your lab results and discuss if we need further medications for your diabetes. Please come by after tomorrow afternoon to pick up your DMV form.

## 2014-10-22 ENCOUNTER — Telehealth: Payer: Self-pay | Admitting: Family Medicine

## 2014-10-22 NOTE — Telephone Encounter (Signed)
emmi emailed °

## 2014-10-29 ENCOUNTER — Telehealth: Payer: Self-pay | Admitting: Family Medicine

## 2014-10-29 NOTE — Telephone Encounter (Signed)
Pt returned your call.  

## 2014-10-29 NOTE — Telephone Encounter (Signed)
See results note. 

## 2014-11-04 ENCOUNTER — Other Ambulatory Visit: Payer: Self-pay | Admitting: Family Medicine

## 2014-11-04 MED ORDER — SITAGLIPTIN PHOSPHATE 50 MG PO TABS: 50.0000 mg | ORAL_TABLET | Freq: Every day | ORAL | Status: DC

## 2014-11-08 ENCOUNTER — Ambulatory Visit
Admit: 2014-11-08 | Disposition: A | Discharge status: Home / Self Care | Payer: Self-pay | Attending: Internal Medicine | Admitting: Internal Medicine

## 2014-11-29 ENCOUNTER — Other Ambulatory Visit: Payer: Self-pay | Admitting: *Deleted

## 2014-11-29 NOTE — Telephone Encounter (Signed)
Pt recently established and med has never been filled by current PCP. pls advise

## 2014-11-30 MED ORDER — TELMISARTAN 80 MG PO TABS: ORAL_TABLET | ORAL | Status: DC

## 2014-12-13 ENCOUNTER — Telehealth: Payer: Self-pay

## 2014-12-13 NOTE — Telephone Encounter (Signed)
Pt left v/m requesting new rx for [employer] blood sugar meter, diabetic strips and lancets. Pt wants to know how often Dr [name] wants pt checking BS. Pt is not on insulin. [place] > pt said medicare will pay for this meter and supplies.pt request cb when sent to pharmacy and pt said next week would be OK.

## 2014-12-17 MED ORDER — PRODIGY VOICE BLOOD GLUCOSE W/DEVICE KIT
PACK | Status: DC
Start: 2014-12-17 — End: 2014-12-26

## 2014-12-17 NOTE — Telephone Encounter (Signed)
Ok to send rx as pt requests- ok to check FSBS once daily initially, then three times weekly.

## 2014-12-17 NOTE — Telephone Encounter (Signed)
Pt left v/m requesting cb about status of diabetic supplies to [facility] [employer]. Pt request cb.

## 2014-12-20 ENCOUNTER — Ambulatory Visit (INDEPENDENT_AMBULATORY_CARE_PROVIDER_SITE_OTHER): Payer: Medicare Other

## 2014-12-20 DIAGNOSIS — E538 Deficiency of other specified B group vitamins: Secondary | ICD-10-CM

## 2014-12-20 MED ORDER — CYANOCOBALAMIN 1000 MCG/ML IJ SOLN
1000.0000 ug | Freq: Once | INTRAMUSCULAR | Status: AC
  Administered 2014-12-20: 1000 ug via INTRAMUSCULAR

## 2014-12-26 ENCOUNTER — Other Ambulatory Visit: Payer: Self-pay | Admitting: *Deleted

## 2014-12-26 MED ORDER — ONETOUCH ULTRASOFT LANCETS MISC: Status: DC

## 2014-12-26 MED ORDER — ONETOUCH ULTRA SYSTEM W/DEVICE KIT
1.0000 | PACK | Freq: Once | Status: DC
Start: 2014-12-26 — End: 2016-03-03

## 2014-12-26 MED ORDER — GLUCOSE BLOOD VI STRP: ORAL_STRIP | Status: DC

## 2014-12-27 ENCOUNTER — Inpatient Hospital Stay: Payer: Medicare Other | Attending: Internal Medicine

## 2014-12-27 ENCOUNTER — Other Ambulatory Visit: Payer: Self-pay

## 2014-12-27 ENCOUNTER — Telehealth: Payer: Self-pay | Admitting: *Deleted

## 2014-12-27 DIAGNOSIS — E119 Type 2 diabetes mellitus without complications: Secondary | ICD-10-CM

## 2014-12-27 DIAGNOSIS — E1165 Type 2 diabetes mellitus with hyperglycemia: Secondary | ICD-10-CM

## 2014-12-27 DIAGNOSIS — D649 Anemia, unspecified: Secondary | ICD-10-CM | POA: Insufficient documentation

## 2014-12-27 DIAGNOSIS — C9210 Chronic myeloid leukemia, BCR/ABL-positive, not having achieved remission: Secondary | ICD-10-CM

## 2014-12-27 DIAGNOSIS — IMO0002 Reserved for concepts with insufficient information to code with codable children: Secondary | ICD-10-CM

## 2014-12-27 LAB — CBC WITH DIFFERENTIAL/PLATELET
BASOS ABS: 0 10*3/uL (ref 0–0.1)
Basophils Relative: 0 %
EOS PCT: 1 %
Eosinophils Absolute: 0 10*3/uL (ref 0–0.7)
HCT: 36.8 % — ABNORMAL LOW (ref 40.0–52.0)
Hemoglobin: 12.1 g/dL — ABNORMAL LOW (ref 13.0–18.0)
Lymphocytes Relative: 30 %
Lymphs Abs: 1.7 10*3/uL (ref 1.0–3.6)
MCH: 30.6 pg (ref 26.0–34.0)
MCHC: 32.9 g/dL (ref 32.0–36.0)
MCV: 92.9 fL (ref 80.0–100.0)
MONO ABS: 1.8 10*3/uL — AB (ref 0.2–1.0)
MONOS PCT: 31 %
NEUTROS ABS: 2.2 10*3/uL (ref 1.4–6.5)
Neutrophils Relative %: 38 %
Platelets: 198 10*3/uL (ref 150–440)
RBC: 3.97 MIL/uL — ABNORMAL LOW (ref 4.40–5.90)
RDW: 14.3 % (ref 11.5–14.5)
WBC: 5.8 10*3/uL (ref 3.8–10.6)

## 2014-12-27 NOTE — Telephone Encounter (Signed)
[place] called stating that they had released his A1c order in error and it needed to be re-entered. Order re-entered as future order.

## 2015-01-07 ENCOUNTER — Other Ambulatory Visit: Payer: Self-pay | Admitting: Family Medicine

## 2015-02-03 ENCOUNTER — Other Ambulatory Visit: Payer: Self-pay

## 2015-02-04 ENCOUNTER — Encounter: Payer: Self-pay | Admitting: Family Medicine

## 2015-02-04 ENCOUNTER — Ambulatory Visit (INDEPENDENT_AMBULATORY_CARE_PROVIDER_SITE_OTHER): Payer: Medicare Other | Admitting: Family Medicine

## 2015-02-04 VITALS — BP 124/74 | HR 77 | Temp 98.0°F | Wt 211.5 lb

## 2015-02-04 DIAGNOSIS — E538 Deficiency of other specified B group vitamins: Secondary | ICD-10-CM | POA: Diagnosis not present

## 2015-02-04 DIAGNOSIS — I10 Essential (primary) hypertension: Secondary | ICD-10-CM

## 2015-02-04 DIAGNOSIS — E118 Type 2 diabetes mellitus with unspecified complications: Secondary | ICD-10-CM | POA: Diagnosis not present

## 2015-02-04 LAB — COMPREHENSIVE METABOLIC PANEL
ALT: 13 U/L (ref 0–53)
AST: 13 U/L (ref 0–37)
Albumin: 3.9 g/dL (ref 3.5–5.2)
Alkaline Phosphatase: 45 U/L (ref 39–117)
BUN: 19 mg/dL (ref 6–23)
CALCIUM: 9.4 mg/dL (ref 8.4–10.5)
CHLORIDE: 100 meq/L (ref 96–112)
CO2: 33 mEq/L — ABNORMAL HIGH (ref 19–32)
Creatinine, Ser: 0.87 mg/dL (ref 0.40–1.50)
GFR: 88.09 mL/min (ref >60.00)
Glucose, Bld: 138 mg/dL — ABNORMAL HIGH (ref 70–99)
POTASSIUM: 4.5 meq/L (ref 3.5–5.1)
Sodium: 137 mEq/L (ref 135–145)
Total Bilirubin: 0.6 mg/dL (ref 0.2–1.2)
Total Protein: 7.6 g/dL (ref 6.0–8.3)

## 2015-02-04 LAB — HEMOGLOBIN A1C: Hgb A1c MFr Bld: 6.9 % — ABNORMAL HIGH (ref 4.6–6.5)

## 2015-02-04 LAB — LIPID PANEL
Cholesterol: 138 mg/dL (ref 0–200)
HDL: 34.8 mg/dL — ABNORMAL LOW (ref >39.00)
NONHDL: 103.2
Total CHOL/HDL Ratio: 4
Triglycerides: 209 mg/dL — ABNORMAL HIGH (ref 0.0–149.0)
VLDL: 41.8 mg/dL — ABNORMAL HIGH (ref 0.0–40.0)

## 2015-02-04 LAB — VITAMIN B12: VITAMIN B 12: 402 pg/mL (ref 211–911)

## 2015-02-04 LAB — LDL CHOLESTEROL, DIRECT: Direct LDL: 81 mg/dL

## 2015-02-04 NOTE — Assessment & Plan Note (Signed)
Well controlled on current rxs. 

## 2015-02-04 NOTE — Progress Notes (Signed)
Pre visit review using our clinic review tool, if applicable. No additional management support is needed unless otherwise documented below in the visit note. 

## 2015-02-04 NOTE — Patient Instructions (Signed)
Good to see you. We will call you with your labs results.  Please schedule your eye exam.

## 2015-02-04 NOTE — Assessment & Plan Note (Signed)
Based on FSBS likely improved. Tolerating addition of Januvia well. Recheck labs today. No changes made. Orders Placed This Encounter  Procedures  . Hemoglobin A1c  . Comprehensive metabolic panel  . Lipid panel  . Vitamin B12

## 2015-02-04 NOTE — Progress Notes (Signed)
Subjective:   Patient ID: Nathan Cunningham, male    DOB: 08-09-1925, 81 y.o.   MRN: [id number]  Nathan Cunningham is a pleasant 81 y.o. year old male who presents to clinic today with Follow-up  on 02/04/2015  HPI:  DM- taking glipizide and Kombiglyze, and added Januvia 3 months ago. No noticeable side effects.  Has started checking FSBS three times weekly.  Brings in his log today- ranging 120s- 150s fasting.  Denies any symptoms of hypoglycemia.  Urine microalbumin pos in 03/2014. Denies any episodes of hypoglycemia.    LDL at goal.  Eye exam due.  Also sees him for MD. Prevnar 13 received. LDL has been at goal.  Lab Results  Component Value Date   HGBA1C 7.5* 10/21/2014   Lab Results  Component Value Date   CHOL 148 06/10/2010   HDL 35.20* 06/10/2010   LDLCALC 77 06/10/2010   LDLDIRECT 90.7 03/19/2014   TRIG 181.0* 06/10/2010   CHOLHDL 4 06/10/2010   Lab Results  Component Value Date   WBC 5.8 12/27/2014   HGB 12.1* 12/27/2014   HCT 36.8* 12/27/2014   MCV 92.9 12/27/2014   PLT 198 12/27/2014    Vit B12 def- had been receiving monthly IM injections but since his B12 was >1500 when I checked it in 03/2014, changed schedule of B12 injections to every other month.  Due for one today. Lab Results  Component Value Date   VITAMINB12 >1500* 10/21/2014   Lab Results  Component Value Date   PSA 0.05* 03/19/2014   PSA 0.08* 03/25/2008   PSA 0.09* 03/23/2007     Current Outpatient Prescriptions on File Prior to Visit  Medication Sig Dispense Refill  . amitriptyline (ELAVIL) 75 MG tablet Take 1 tablet (75 mg total) by mouth at bedtime. 90 tablet 1  . aspirin 325 MG tablet Take 162 mg by mouth daily.    . Blood Glucose Monitoring Suppl (ONE TOUCH ULTRA SYSTEM KIT) W/DEVICE KIT 1 kit by Does not apply route once. 1 each 0  . Chromium Picolinate (RA CHROMIUM PICOLINATE) 400 MCG TABS Take 1 tablet by mouth daily.      . cyanocobalamin (,VITAMIN B-12,) 1000 MCG/ML  injection Inject 1057mg every 60days IM (every other month) 1 mL 0  . docusate sodium (COLACE) 100 MG capsule Take 100 mg by mouth 3 (three) times daily as needed.      . dorzolamide-timolol (COSOPT) 22.3-6.8 MG/ML ophthalmic solution 1 drop 2 (two) times daily.      . fish oil-omega-3 fatty acids 1000 MG capsule Take by mouth. Take six daily    . glipiZIDE (GLIPIZIDE XL) 5 MG 24 hr tablet Take 1 tablet (5 mg total) by mouth daily. 90 tablet 0  . glucose blood (ONE TOUCH ULTRA TEST) test strip Use as instructed 100 each 0  . JANUVIA 50 MG tablet TAKE 1 BY MOUTH DAILY 90 tablet 1  . Lancets (ONETOUCH ULTRASOFT) lancets Use as instructed 100 each 0  . Linaclotide (LINZESS) 145 MCG CAPS capsule Take 1 capsule (145 mcg total) by mouth daily. 30 capsule   . Magnesium-Calcium-Folic Acid (MAGNEBIND 4935 400-200-1 MG TABS Take 1 tablet by mouth daily.      . Melatonin 5 MG CAPS Take 1 each by mouth daily.      . Multiple Vitamins-Minerals (EYE-VITES) TABS Take 1 tablet by mouth 2 (two) times daily.      . Potassium Gluconate (K-99) 595 MG CAPS Take 1 each by mouth daily.      .Marland Kitchen  Saxagliptin-Metformin (KOMBIGLYZE XR) 12-998 MG TB24 Take 1 each by mouth daily. 90 tablet 3  . tadalafil (CIALIS) 5 MG tablet Take 1 tablet (5 mg total) by mouth daily as needed for erectile dysfunction. 10 tablet 0  . tamsulosin (FLOMAX) 0.4 MG CAPS capsule Take 1 capsule (0.4 mg total) by mouth daily. 90 capsule 3  . telmisartan (MICARDIS) 80 MG tablet TAKE 1 TABLET DAILY 90 tablet 3   No current facility-administered medications on file prior to visit.    No Known Allergies  Past Medical History  Diagnosis Date  . Hypertension   . Diabetes mellitus   . Hyperlipidemia   . Macular degeneration     Right eye  . Constipation   . ETOH abuse     binge drinking  . Dizziness   . Diverticulosis   . Adenomatous polyp of colon 03/2003  . Hemorrhoids   . Sleep apnea     on CPAP    Past Surgical History  Procedure  Laterality Date  . Appendectomy      age 54  . Cataract extraction      Left eye  . Inguinal hernia repair      age 71  . Cevical fusion    . Cardiac defibrillator placement    . Tonsillectomy      Family History  Problem Relation Age of Onset  . Heart disease Father   . Stroke Father 82    History   Social History  . Marital Status: Unknown    Spouse Name: N/A  . Number of Children: 2  . Years of Education: N/A   Occupational History  . retired    Social History Main Topics  . Smoking status: Never Smoker   . Smokeless tobacco: Never Used  . Alcohol Use: 7.0 oz/week    14 drink(s) per week  . Drug Use: No  . Sexual Activity: No   Other Topics Concern  . Not on file   Social History Narrative   Retired from [employer]   Goes to [place] sneakers 3 times a week.   Has a girlfriend.   Does not have a living will yet.   Desires CPR, does not prolonged life support if futile.         The PMH, PSH, Social History, Family History, Medications, and allergies have been reviewed in [facility], and have been updated if relevant.    Review of Systems  Constitutional: Negative.   Eyes: Negative.   Respiratory: Negative.   Cardiovascular: Negative.   Endocrine: Negative.   Musculoskeletal: Negative.   Skin: Negative.   Neurological: Negative.   Hematological: Negative.   Psychiatric/Behavioral: Negative.   All other systems reviewed and are negative.  See HPI      Objective:    BP 124/74 mmHg  Pulse 77  Temp(Src) 98 F (36.7 C) (Oral)  Wt 211 lb 8 oz (95.936 kg)  SpO2 95%   Physical Exam   Constitutional: He is oriented to person, place, and time. He appears well-developed and well-nourished. No distress.  HENT:  Head: Normocephalic and atraumatic.  Eyes: Conjunctivae and EOM are normal. Pupils are equal, round, and reactive to light.  Neck: Normal range of motion.  Cardiovascular: Normal rate, regular rhythm and intact distal pulses.    Pulmonary/Chest: Effort normal and breath sounds normal. No respiratory distress. He exhibits no tenderness.  Musculoskeletal: Normal range of motion. He exhibits no edema.  Neurological: He is alert and oriented to  person, place, and time.  Skin: Skin is warm and dry. No rash noted. He is not diaphoretic. No erythema. No pallor.  Psychiatric: He has a normal mood and affect. His behavior is normal. Judgment and thought content normal.       Assessment & Plan:   Type 2 diabetes mellitus with complication  Essential hypertension No Follow-up on file.

## 2015-02-05 ENCOUNTER — Encounter: Payer: Self-pay | Admitting: *Deleted

## 2015-02-18 ENCOUNTER — Other Ambulatory Visit: Payer: Self-pay | Admitting: Family Medicine

## 2015-02-18 NOTE — Telephone Encounter (Signed)
[facility] left v/m requesting refill one touch ultra test strips. Done per protocol.

## 2015-02-19 ENCOUNTER — Ambulatory Visit (INDEPENDENT_AMBULATORY_CARE_PROVIDER_SITE_OTHER): Payer: Medicare Other

## 2015-02-19 DIAGNOSIS — E538 Deficiency of other specified B group vitamins: Secondary | ICD-10-CM

## 2015-02-19 MED ORDER — CYANOCOBALAMIN 1000 MCG/ML IJ SOLN
1000.0000 ug | Freq: Once | INTRAMUSCULAR | Status: AC
  Administered 2015-02-19: 1000 ug via INTRAMUSCULAR

## 2015-02-26 ENCOUNTER — Other Ambulatory Visit: Payer: Self-pay | Admitting: Family Medicine

## 2015-02-27 ENCOUNTER — Other Ambulatory Visit: Payer: Self-pay

## 2015-02-27 DIAGNOSIS — C9250 Acute myelomonocytic leukemia, not having achieved remission: Secondary | ICD-10-CM | POA: Insufficient documentation

## 2015-02-28 ENCOUNTER — Inpatient Hospital Stay: Payer: Medicare Other | Attending: Internal Medicine

## 2015-02-28 DIAGNOSIS — C9250 Acute myelomonocytic leukemia, not having achieved remission: Secondary | ICD-10-CM

## 2015-02-28 DIAGNOSIS — E538 Deficiency of other specified B group vitamins: Secondary | ICD-10-CM | POA: Diagnosis not present

## 2015-02-28 LAB — CBC
HCT: 36.9 % — ABNORMAL LOW (ref 40.0–52.0)
Hemoglobin: 12.2 g/dL — ABNORMAL LOW (ref 13.0–18.0)
MCH: 30.8 pg (ref 26.0–34.0)
MCHC: 33.1 g/dL (ref 32.0–36.0)
MCV: 93 fL (ref 80.0–100.0)
Platelets: 161 10*3/uL (ref 150–440)
RBC: 3.96 MIL/uL — ABNORMAL LOW (ref 4.40–5.90)
RDW: 14.4 % (ref 11.5–14.5)
WBC: 5.4 10*3/uL (ref 3.8–10.6)

## 2015-02-28 LAB — CREATININE, SERUM
CREATININE: 0.76 mg/dL (ref 0.61–1.24)
GFR calc Af Amer: >60 mL/min (ref >60)
GFR calc non Af Amer: >60 mL/min (ref >60)

## 2015-03-02 LAB — KAPPA/LAMBDA LIGHT CHAINS
KAPPA, LAMDA LIGHT CHAIN RATIO: 0.92 (ref 0.26–1.65)
Kappa free light chain: 32.48 mg/L — ABNORMAL HIGH (ref 3.30–19.40)
Lambda free light chains: 35.21 mg/L — ABNORMAL HIGH (ref 5.71–26.30)

## 2015-03-03 LAB — PROTEIN ELECTROPHORESIS, SERUM
A/G Ratio: 0.9 (ref 0.7–1.7)
ALPHA-1-GLOBULIN: 0.2 g/dL (ref 0.0–0.4)
Albumin ELP: 3.4 g/dL (ref 2.9–4.4)
Alpha-2-Globulin: 0.6 g/dL (ref 0.4–1.0)
Beta Globulin: 1 g/dL (ref 0.7–1.3)
GAMMA GLOBULIN: 1.8 g/dL (ref 0.4–1.8)
GLOBULIN, TOTAL: 3.6 g/dL (ref 2.2–3.9)
M-Spike, %: 0.9 g/dL — ABNORMAL HIGH
TOTAL PROTEIN ELP: 7 g/dL (ref 6.0–8.5)

## 2015-05-02 ENCOUNTER — Other Ambulatory Visit: Payer: Self-pay

## 2015-05-02 ENCOUNTER — Inpatient Hospital Stay: Payer: Medicare Other | Attending: Internal Medicine

## 2015-05-02 DIAGNOSIS — D649 Anemia, unspecified: Secondary | ICD-10-CM | POA: Insufficient documentation

## 2015-05-02 DIAGNOSIS — C9210 Chronic myeloid leukemia, BCR/ABL-positive, not having achieved remission: Secondary | ICD-10-CM

## 2015-05-02 LAB — CBC WITH DIFFERENTIAL/PLATELET
Basophils Absolute: 0 10*3/uL (ref 0–0.1)
Basophils Relative: 0 %
EOS PCT: 0 %
Eosinophils Absolute: 0 10*3/uL (ref 0–0.7)
HCT: 36.7 % — ABNORMAL LOW (ref 40.0–52.0)
HEMOGLOBIN: 12.3 g/dL — AB (ref 13.0–18.0)
LYMPHS ABS: 2 10*3/uL (ref 1.0–3.6)
LYMPHS PCT: 32 %
MCH: 31.1 pg (ref 26.0–34.0)
MCHC: 33.5 g/dL (ref 32.0–36.0)
MCV: 92.8 fL (ref 80.0–100.0)
MONOS PCT: 32 %
Monocytes Absolute: 2 10*3/uL — ABNORMAL HIGH (ref 0.2–1.0)
Neutro Abs: 2.2 10*3/uL (ref 1.4–6.5)
Neutrophils Relative %: 36 %
PLATELETS: 171 10*3/uL (ref 150–440)
RBC: 3.96 MIL/uL — AB (ref 4.40–5.90)
RDW: 14 % (ref 11.5–14.5)
WBC: 6.2 10*3/uL (ref 3.8–10.6)

## 2015-05-07 ENCOUNTER — Encounter: Payer: Self-pay | Admitting: Family Medicine

## 2015-05-07 ENCOUNTER — Ambulatory Visit (INDEPENDENT_AMBULATORY_CARE_PROVIDER_SITE_OTHER): Payer: Medicare Other | Admitting: Family Medicine

## 2015-05-07 VITALS — BP 126/70 | HR 80 | Temp 98.0°F | Wt 208.5 lb

## 2015-05-07 DIAGNOSIS — E538 Deficiency of other specified B group vitamins: Secondary | ICD-10-CM

## 2015-05-07 DIAGNOSIS — I10 Essential (primary) hypertension: Secondary | ICD-10-CM

## 2015-05-07 DIAGNOSIS — E118 Type 2 diabetes mellitus with unspecified complications: Secondary | ICD-10-CM

## 2015-05-07 LAB — HEMOGLOBIN A1C: Hgb A1c MFr Bld: 7.1 % — ABNORMAL HIGH (ref 4.6–6.5)

## 2015-05-07 LAB — COMPREHENSIVE METABOLIC PANEL
ALBUMIN: 3.7 g/dL (ref 3.5–5.2)
ALT: 14 U/L (ref 0–53)
AST: 11 U/L (ref 0–37)
Alkaline Phosphatase: 47 U/L (ref 39–117)
BILIRUBIN TOTAL: 0.6 mg/dL (ref 0.2–1.2)
BUN: 19 mg/dL (ref 6–23)
CALCIUM: 9.3 mg/dL (ref 8.4–10.5)
CHLORIDE: 99 meq/L (ref 96–112)
CO2: 32 mEq/L (ref 19–32)
CREATININE: 0.91 mg/dL (ref 0.40–1.50)
GFR: 83.59 mL/min (ref >60.00)
Glucose, Bld: 158 mg/dL — ABNORMAL HIGH (ref 70–99)
Potassium: 4.5 mEq/L (ref 3.5–5.1)
Sodium: 135 mEq/L (ref 135–145)
Total Protein: 7.2 g/dL (ref 6.0–8.3)

## 2015-05-07 LAB — VITAMIN B12

## 2015-05-07 MED ORDER — GLUCOSE BLOOD VI STRP: ORAL_STRIP | Status: DC

## 2015-05-07 MED ORDER — CYANOCOBALAMIN 1000 MCG/ML IJ SOLN
1000.0000 ug | Freq: Once | INTRAMUSCULAR | Status: AC
  Administered 2015-05-07: 1000 ug via INTRAMUSCULAR

## 2015-05-07 NOTE — Patient Instructions (Signed)
Great to see you. We will call you with your results from today.  Try changing your air filters.  Keep me updated.  We may stop one of your diabetes medications based on your results from today.

## 2015-05-07 NOTE — Assessment & Plan Note (Signed)
Well controlled. No changes in rxs today. 

## 2015-05-07 NOTE — Assessment & Plan Note (Signed)
Improving.  Check a1c today.  May be able to d/c one of his rxs- ?glipizide vs Januvia.

## 2015-05-07 NOTE — Progress Notes (Signed)
Pre visit review using our clinic review tool, if applicable. No additional management support is needed unless otherwise documented below in the visit note. 

## 2015-05-07 NOTE — Progress Notes (Signed)
Subjective:   Patient ID: Nathan Cunningham, male    DOB: [birth year], 81 y.o.   MRN: [id number]  Nathan Cunningham is a pleasant 81 y.o. year old male who presents to clinic today with Follow-up  on 05/07/2015  HPI:  DM- taking glipizide and Kombiglyze, and Januvia. No noticeable side effects.  Checking FSBS three times weekly.  Brings in his log today- ranging 120s- 150s fasting.  Denies any symptoms of hypoglycemia.  Attended diabetic teaching at [facility] and felt that it was very helpful. He has been losing weight. Wt Readings from Last 3 Encounters:  05/07/15 208 lb 8 oz (94.575 kg)  02/04/15 211 lb 8 oz (95.936 kg)  10/21/14 214 lb (97.07 kg)     Urine microalbumin pos in 03/2014. Denies any episodes of hypoglycemia.    LDL at goal.  Eye exam due.  Also sees him for macular degeneration.  Pneumococcal vaccines UTD.  LDL has been at goal.  Lab Results  Component Value Date   HGBA1C 6.9* 02/04/2015   Lab Results  Component Value Date   CHOL 138 02/04/2015   HDL 34.80* 02/04/2015   LDLCALC 77 06/10/2010   LDLDIRECT 81.0 02/04/2015   TRIG 209.0* 02/04/2015   CHOLHDL 4 02/04/2015   Lab Results  Component Value Date   WBC 6.2 05/02/2015   HGB 12.3* 05/02/2015   HCT 36.7* 05/02/2015   MCV 92.8 05/02/2015   PLT 171 05/02/2015    Vit B12 def- had been receiving monthly IM injections but since his B12 was >1500 when I checked it in 03/2014, changed schedule of B12 injections to every other month.  Due for one today.  Still complains of fatigue.  Has appt with pulmonary coming up to discuss CPAP. Lab Results  Component Value Date   WOEHOZYY48 250 02/04/2015   Lab Results  Component Value Date   PSA 0.05* 03/19/2014   PSA 0.08* 03/25/2008   PSA 0.09* 03/23/2007     Current Outpatient Prescriptions on File Prior to Visit  Medication Sig Dispense Refill  . amitriptyline (ELAVIL) 75 MG tablet TAKE 1 BY MOUTH AT BEDTIME 90 tablet 0  . aspirin 325 MG tablet Take  162 mg by mouth daily.    . Blood Glucose Monitoring Suppl (ONE TOUCH ULTRA SYSTEM KIT) W/DEVICE KIT 1 kit by Does not apply route once. 1 each 0  . Chromium Picolinate (RA CHROMIUM PICOLINATE) 400 MCG TABS Take 1 tablet by mouth daily.      . cyanocobalamin (,VITAMIN B-12,) 1000 MCG/ML injection Inject 1054mg every 60days IM (every other month) 1 mL 0  . docusate sodium (COLACE) 100 MG capsule Take 100 mg by mouth 3 (three) times daily as needed.      . dorzolamide-timolol (COSOPT) 22.3-6.8 MG/ML ophthalmic solution 1 drop 2 (two) times daily.      . fish oil-omega-3 fatty acids 1000 MG capsule Take by mouth. Take six daily    . glipiZIDE (GLUCOTROL XL) 5 MG 24 hr tablet Take 1 tablet (5 mg total) by mouth daily. 90 tablet 1  . JANUVIA 50 MG tablet TAKE 1 BY MOUTH DAILY 90 tablet 1  . Lancets (ONETOUCH ULTRASOFT) lancets Use as instructed 100 each 0  . Linaclotide (LINZESS) 145 MCG CAPS capsule Take 1 capsule (145 mcg total) by mouth daily. 30 capsule   . Magnesium-Calcium-Folic Acid (MAGNEBIND 4037 400-200-1 MG TABS Take 1 tablet by mouth daily.      . Melatonin 5 MG CAPS Take 1  each by mouth daily.      . Multiple Vitamins-Minerals (EYE-VITES) TABS Take 1 tablet by mouth 2 (two) times daily.      . ONE TOUCH ULTRA TEST test strip USE AS DIRECTED TO TEST BLOOD SUGAR ONCE DAILY 100 each 1  . Potassium Gluconate (K-99) 595 MG CAPS Take 1 each by mouth daily.      . Saxagliptin-Metformin (KOMBIGLYZE XR) 12-998 MG TB24 Take 1 each by mouth daily. 90 tablet 3  . tadalafil (CIALIS) 5 MG tablet Take 1 tablet (5 mg total) by mouth daily as needed for erectile dysfunction. 10 tablet 0  . tamsulosin (FLOMAX) 0.4 MG CAPS capsule Take 1 capsule (0.4 mg total) by mouth daily. 90 capsule 3  . telmisartan (MICARDIS) 80 MG tablet TAKE 1 TABLET DAILY 90 tablet 3   No current facility-administered medications on file prior to visit.    No Known Allergies  Past Medical History  Diagnosis Date  .  Hypertension   . Diabetes mellitus   . Hyperlipidemia   . Macular degeneration     Right eye  . Constipation   . ETOH abuse     binge drinking  . Dizziness   . Diverticulosis   . Adenomatous polyp of colon 03/2003  . Hemorrhoids   . Sleep apnea     on CPAP    Past Surgical History  Procedure Laterality Date  . Appendectomy      age 25  . Cataract extraction      Left eye  . Inguinal hernia repair      age 89  . Cevical fusion    . Cardiac defibrillator placement    . Tonsillectomy      Family History  Problem Relation Age of Onset  . Heart disease Father   . Stroke Father 63    Social History   Social History  . Marital Status: Unknown    Spouse Name: N/A  . Number of Children: 2  . Years of Education: N/A   Occupational History  . retired    Social History Main Topics  . Smoking status: Never Smoker   . Smokeless tobacco: Never Used  . Alcohol Use: 7.0 oz/week    14 drink(s) per week  . Drug Use: No  . Sexual Activity: No   Other Topics Concern  . Not on file   Social History Narrative   Retired from [employer]   Goes to [place] sneakers 3 times a week.   Has a girlfriend.   Does not have a living will yet.   Desires CPR, does not prolonged life support if futile.         The PMH, PSH, Social History, Family History, Medications, and allergies have been reviewed in [facility], and have been updated if relevant.    Review of Systems  Constitutional: Negative.   Eyes: Negative.   Respiratory: Negative.   Cardiovascular: Negative.   Endocrine: Negative.   Musculoskeletal: Negative.   Skin: Negative.   Neurological: Negative.   Hematological: Negative.   Psychiatric/Behavioral: Negative.   All other systems reviewed and are negative.        Objective:    BP 126/70 mmHg  Pulse 80  Temp(Src) 98 F (36.7 C) (Oral)  Wt 208 lb 8 oz (94.575 kg)  SpO2 90%  Wt Readings from Last 3 Encounters:  05/07/15 208 lb 8 oz (94.575 kg)  02/04/15  211 lb 8 oz (95.936 kg)  10/21/14 214 lb (  97.07 kg)    Physical Exam   Constitutional: He is oriented to person, place, and time. He appears well-developed and well-nourished. No distress.  HENT:  Head: Normocephalic and atraumatic.  Eyes: Conjunctivae and EOM are normal. Pupils are equal, round, and reactive to light.  Neck: Normal range of motion.  Cardiovascular: Normal rate, regular rhythm and intact distal pulses.  Pulmonary/Chest: Effort normal and breath sounds normal. No respiratory distress. He exhibits no tenderness.  Musculoskeletal: Normal range of motion. He exhibits no edema.  Neurological: He is alert and oriented to person, place, and time.  Skin: Skin is warm and dry. No rash noted. He is not diaphoretic. No erythema. No pallor.  Psychiatric: He has a normal mood and affect. His behavior is normal. Judgment and thought content normal.       Assessment & Plan:   Essential hypertension  Type 2 diabetes mellitus with complication - Plan: Microalbumin / creatinine urine ratio, Hemoglobin A1c, Comprehensive metabolic panel  H87 deficiency - Plan: Vitamin B12 No Follow-up on file.

## 2015-05-07 NOTE — Addendum Note (Signed)
Addended by: Modena Nunnery on: 05/07/2015 12:49 PM   Modules accepted: Orders

## 2015-05-07 NOTE — Assessment & Plan Note (Signed)
IM B12 today. Check serum B12 as well.

## 2015-05-09 ENCOUNTER — Ambulatory Visit (INDEPENDENT_AMBULATORY_CARE_PROVIDER_SITE_OTHER): Payer: Medicare Other | Admitting: Pulmonary Disease

## 2015-05-09 ENCOUNTER — Ambulatory Visit: Payer: Medicare Other | Admitting: Pulmonary Disease

## 2015-05-09 ENCOUNTER — Encounter: Payer: Self-pay | Admitting: Pulmonary Disease

## 2015-05-09 VITALS — BP 114/60 | HR 69 | Temp 98.0°F | Ht 68.0 in | Wt 210.5 lb

## 2015-05-09 DIAGNOSIS — G4733 Obstructive sleep apnea (adult) (pediatric): Secondary | ICD-10-CM | POA: Diagnosis not present

## 2015-05-09 DIAGNOSIS — G4737 Central sleep apnea in conditions classified elsewhere: Secondary | ICD-10-CM | POA: Diagnosis not present

## 2015-05-09 DIAGNOSIS — G4731 Primary central sleep apnea: Secondary | ICD-10-CM

## 2015-05-09 LAB — MICROALBUMIN / CREATININE URINE RATIO
Creatinine,U: 79.6 mg/dL
MICROALB/CREAT RATIO: 3 mg/g (ref 0.0–30.0)
Microalb, Ur: 2.4 mg/dL — ABNORMAL HIGH (ref 0.0–1.9)

## 2015-05-09 NOTE — Patient Instructions (Addendum)
Will call with results of CPAP download  Follow up 1 year

## 2015-05-09 NOTE — Progress Notes (Signed)
Chief Complaint  Patient presents with  . Follow-up    [facility] pt.  pt states he is doing well. pt states hes used the CPAP religiously for a few years until about a month ago.  pt states he always feels chronically tired in the morning and during the day even when he has used the CPAP so he thinks the CPAP is not really useful for him.  mask and pressure was good for pt. DME: [facility]    History of Present Illness: Nathan Cunningham is a 81 y.o. male with Complex sleep apnea.  He was previously followed by Dr. Gwenette Greet.  He stopped using CPAP.  He wasn't sure this was helping.  He didn't have any issues with CPAP mask.  TESTS: AHI 02/28/12 >> AHI 70  PMhx >> HTN, DM, HLD, Macular degeneration, Diverticulosis  Past surgical hx, Medications, Allergies, Family hx, Social hx all reviewed.   Physical Exam: BP 114/60 mmHg  Pulse 69  Temp(Src) 98 F (36.7 C) (Oral)  Ht 5\' 8"  (1.727 m)  Wt 210 lb 8 oz (95.482 kg)  BMI 32.01 kg/m2  SpO2 92%  General - No distress ENT - No sinus tenderness, no oral exudate, no LAN Cardiac - s1s2 regular, no murmur Chest - No wheeze/rales/dullness Back - No focal tenderness Abd - Soft, non-tender Ext - No edema Neuro - Normal strength Skin - No rashes Psych - normal mood, and behavior   Assessment/Plan:  Complex sleep apnea. Explained the rationale for continue CPAP therapy.  Reviewed health risks regarding HTN, cardiac disease, and risk of stroke with untreated severe sleep apnea. Plan: - he is agreeable to resume CPAP - will get copy of his download one month after resuming CPAP, and then determine if he needs adjustment to his set up   Chesley Mires, MD [facility] Pager:  [phone]

## 2015-05-28 ENCOUNTER — Other Ambulatory Visit: Payer: Self-pay | Admitting: Family Medicine

## 2015-06-06 ENCOUNTER — Other Ambulatory Visit: Payer: Self-pay | Admitting: *Deleted

## 2015-06-06 MED ORDER — GLUCOSE BLOOD VI STRP: ORAL_STRIP | Status: DC

## 2015-06-27 ENCOUNTER — Inpatient Hospital Stay: Payer: Medicare Other

## 2015-06-27 ENCOUNTER — Inpatient Hospital Stay: Payer: Medicare Other | Attending: Internal Medicine | Admitting: Internal Medicine

## 2015-06-27 VITALS — BP 147/83 | HR 84 | Temp 97.3°F | Resp 18 | Ht 68.0 in | Wt 211.4 lb

## 2015-06-27 DIAGNOSIS — C9250 Acute myelomonocytic leukemia, not having achieved remission: Secondary | ICD-10-CM

## 2015-06-27 DIAGNOSIS — Z7984 Long term (current) use of oral hypoglycemic drugs: Secondary | ICD-10-CM | POA: Diagnosis not present

## 2015-06-27 DIAGNOSIS — R5383 Other fatigue: Secondary | ICD-10-CM | POA: Diagnosis not present

## 2015-06-27 DIAGNOSIS — F1010 Alcohol abuse, uncomplicated: Secondary | ICD-10-CM | POA: Diagnosis not present

## 2015-06-27 DIAGNOSIS — K648 Other hemorrhoids: Secondary | ICD-10-CM | POA: Diagnosis not present

## 2015-06-27 DIAGNOSIS — I10 Essential (primary) hypertension: Secondary | ICD-10-CM | POA: Insufficient documentation

## 2015-06-27 DIAGNOSIS — D472 Monoclonal gammopathy: Secondary | ICD-10-CM

## 2015-06-27 DIAGNOSIS — E785 Hyperlipidemia, unspecified: Secondary | ICD-10-CM | POA: Diagnosis not present

## 2015-06-27 DIAGNOSIS — E1165 Type 2 diabetes mellitus with hyperglycemia: Secondary | ICD-10-CM

## 2015-06-27 DIAGNOSIS — K5900 Constipation, unspecified: Secondary | ICD-10-CM | POA: Insufficient documentation

## 2015-06-27 DIAGNOSIS — Z86010 Personal history of colonic polyps: Secondary | ICD-10-CM | POA: Insufficient documentation

## 2015-06-27 DIAGNOSIS — R42 Dizziness and giddiness: Secondary | ICD-10-CM | POA: Diagnosis not present

## 2015-06-27 DIAGNOSIS — K5790 Diverticulosis of intestine, part unspecified, without perforation or abscess without bleeding: Secondary | ICD-10-CM | POA: Diagnosis not present

## 2015-06-27 DIAGNOSIS — Z7982 Long term (current) use of aspirin: Secondary | ICD-10-CM | POA: Diagnosis not present

## 2015-06-27 DIAGNOSIS — Z79899 Other long term (current) drug therapy: Secondary | ICD-10-CM | POA: Insufficient documentation

## 2015-06-27 DIAGNOSIS — G4730 Sleep apnea, unspecified: Secondary | ICD-10-CM | POA: Insufficient documentation

## 2015-06-27 DIAGNOSIS — C9310 Chronic myelomonocytic leukemia not having achieved remission: Secondary | ICD-10-CM | POA: Diagnosis present

## 2015-06-27 LAB — CBC WITH DIFFERENTIAL/PLATELET
BASOS PCT: 0 %
Basophils Absolute: 0 10*3/uL (ref 0–0.1)
EOS ABS: 0 10*3/uL (ref 0–0.7)
Eosinophils Relative: 0 %
HEMATOCRIT: 35.6 % — AB (ref 40.0–52.0)
Hemoglobin: 11.8 g/dL — ABNORMAL LOW (ref 13.0–18.0)
Lymphocytes Relative: 26 %
Lymphs Abs: 1.6 10*3/uL (ref 1.0–3.6)
MCH: 30.7 pg (ref 26.0–34.0)
MCHC: 33.2 g/dL (ref 32.0–36.0)
MCV: 92.7 fL (ref 80.0–100.0)
MONO ABS: 1.7 10*3/uL — AB (ref 0.2–1.0)
MONOS PCT: 28 %
NEUTROS ABS: 2.9 10*3/uL (ref 1.4–6.5)
Neutrophils Relative %: 46 %
Platelets: 156 10*3/uL (ref 150–440)
RBC: 3.84 MIL/uL — ABNORMAL LOW (ref 4.40–5.90)
RDW: 14 % (ref 11.5–14.5)
WBC: 6.2 10*3/uL (ref 3.8–10.6)

## 2015-06-27 LAB — CREATININE, SERUM
CREATININE: 0.9 mg/dL (ref 0.61–1.24)
GFR calc Af Amer: >60 mL/min (ref >60)

## 2015-06-27 LAB — CALCIUM: CALCIUM: 8.8 mg/dL — AB (ref 8.9–10.3)

## 2015-06-27 NOTE — Progress Notes (Signed)
Patient states that overall he has been doing well. He states that he does have a little neuropathy in his hands and feet, but feet mostly.

## 2015-06-27 NOTE — Progress Notes (Signed)
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 I reviewed the patient's prior charts/pertinent labs/imaging in detail; findings are summarized above.   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  Patient overall feels okay; however complains of fatigue needing to rest frequently. He denies any unusual night sweats or weight loss or fevers. No lumps or bumps. Denies any new onset of back pain.  REVIEW OF SYSTEMS:  A complete 10 point review of system is done which is negative except mentioned above/history of present illness.   PAST MEDICAL HISTORY :  Past Medical History  Diagnosis Date  . Hypertension   . Diabetes mellitus   . Hyperlipidemia   . Macular degeneration     Right eye  . Constipation   . ETOH abuse     binge drinking  . Dizziness   . Diverticulosis   . Adenomatous polyp of colon 03/2003  . Hemorrhoids   . Sleep apnea     on CPAP    PAST SURGICAL HISTORY :   Past Surgical History  Procedure Laterality Date  . Appendectomy      age 29  . Cataract extraction      Left eye  . Inguinal hernia repair      age 89  . Cevical fusion    . Cardiac defibrillator placement    . Tonsillectomy       FAMILY HISTORY :   Family History  Problem Relation Age of Onset  . Heart disease Father   . Stroke Father 51    SOCIAL HISTORY:   Social History  Substance Use Topics  . Smoking status: Never Smoker   . Smokeless tobacco: Never Used  . Alcohol Use: 7.0 oz/week    14 drink(s) per week    ALLERGIES:  has No Known Allergies.  MEDICATIONS:  Current Outpatient Prescriptions  Medication Sig Dispense Refill  . amitriptyline (ELAVIL) 75 MG tablet TAKE 1 BY MOUTH AT BEDTIME 90 tablet 0  . aspirin 325 MG tablet Take 162 mg by mouth daily.    . Blood Glucose Monitoring Suppl (ONE TOUCH ULTRA SYSTEM KIT) W/DEVICE KIT 1 kit by Does not apply route once. 1 each 0  . Chromium Picolinate (RA CHROMIUM PICOLINATE) 400 MCG TABS Take 1 tablet by mouth daily.      . cyanocobalamin (,VITAMIN B-12,) 1000 MCG/ML injection Inject 1042mg every 60days IM (every other month) 1 mL 0  . docusate sodium (COLACE) 100 MG capsule Take 100 mg by mouth 3 (three) times daily as needed.      . dorzolamide-timolol (COSOPT) 22.3-6.8 MG/ML ophthalmic solution 1 drop 2 (two) times daily.      . fish oil-omega-3 fatty acids 1000 MG capsule Take by mouth. Take six daily    . glucose blood (ONE  TOUCH ULTRA TEST) test strip USE AS DIRECTED TO TEST BLOOD SUGAR ONCE DAILY 100 each 1  . JANUVIA 50 MG tablet TAKE 1 BY MOUTH DAILY 90 tablet 1  . Lancets (ONETOUCH ULTRASOFT) lancets Use as instructed 100 each 0  . Linaclotide (LINZESS) 145 MCG CAPS capsule Take 1 capsule (145 mcg total) by mouth daily. 30 capsule   . Magnesium-Calcium-Folic Acid (MAGNEBIND 270) 400-200-1 MG TABS Take 1 tablet by mouth daily.      . Melatonin 5 MG CAPS Take 1 each by mouth daily.      . Multiple Vitamins-Minerals (EYE-VITES) TABS Take 1 tablet by mouth 2 (two) times daily.      . Potassium Gluconate (K-99) 595 MG CAPS Take 1 each by mouth daily.      . Saxagliptin-Metformin (KOMBIGLYZE XR) 12-998 MG TB24 Take 1 each by mouth daily. 90  tablet 3  . tadalafil (CIALIS) 5 MG tablet Take 1 tablet (5 mg total) by mouth daily as needed for erectile dysfunction. 10 tablet 0  . tamsulosin (FLOMAX) 0.4 MG CAPS capsule Take 1 capsule (0.4 mg total) by mouth daily. 90 capsule 3  . telmisartan (MICARDIS) 80 MG tablet TAKE 1 TABLET DAILY 90 tablet 3  . glipiZIDE (GLUCOTROL XL) 5 MG 24 hr tablet Take 1 tablet (5 mg total) by mouth daily. (Patient not taking: Reported on 06/27/2015) 90 tablet 1   No current facility-administered medications for this visit.    PHYSICAL EXAMINATION: ECOG PERFORMANCE STATUS: 0 - Asymptomatic  BP 147/83 mmHg  Pulse 84  Temp(Src) 97.3 F (36.3 C) (Tympanic)  Resp 18  Ht _0  (1.727 m)  Wt 211 lb 6.7 oz (95.9 kg)  BMI 32.15 kg/m2  Filed Weights   06/27/15 1150  Weight: 211 lb 6.7 oz (95.9 kg)    GENERAL: Elderly Caucasian gentleman. Well-nourished well-developed; Alert, no distress and comfortable.    He walks with a walker. He is alone. EYES: no pallor or icterus OROPHARYNX: no thrush or ulceration; good dentition  NECK: supple, no masses felt LYMPH:  no palpable lymphadenopathy in the cervical, axillary or inguinal regions LUNGS: clear to auscultation and  No wheeze or crackles HEART/CVS: regular rate & rhythm and no murmurs; No lower extremity edema ABDOMEN:abdomen soft, non-tender and normal bowel sounds Musculoskeletal:no cyanosis of digits and no clubbing  PSYCH: alert & oriented x 3 with fluent speech NEURO: no focal motor/sensory deficits SKIN:  no rashes or significant lesions  LABORATORY DATA:  I have reviewed the data as listed    Component Value Date/Time   NA 135 05/07/2015 1225   NA 134* 12/01/2011 1159   K 4.5 05/07/2015 1225   K 4.7 12/01/2011 1159   CL 99 05/07/2015 1225   CL 99 12/01/2011 1159   CO2 32 05/07/2015 1225   CO2 29 12/01/2011 1159   GLUCOSE 158* 05/07/2015 1225   GLUCOSE 167* 12/01/2011 1159   BUN 19 05/07/2015 1225   BUN 12 12/01/2011 1159    CREATININE 0.90 06/27/2015 1112   CREATININE 0.70 12/01/2011 1159   CALCIUM 8.8* 06/27/2015 1112   CALCIUM 8.5 12/01/2011 1159   PROT 7.2 05/07/2015 1225   PROT 7.0 12/01/2011 1159   ALBUMIN 3.7 05/07/2015 1225   ALBUMIN 3.0* 12/01/2011 1159   AST 11 05/07/2015 1225   AST 16 12/01/2011 1159   ALT 14 05/07/2015 1225   ALT 19 12/01/2011 1159   ALKPHOS 47 05/07/2015 1225   ALKPHOS 64 12/01/2011 1159   BILITOT 0.6  05/07/2015 1225   BILITOT 0.8 12/01/2011 1159   GFRNONAA >60 06/27/2015 1112   GFRNONAA >60 12/01/2011 1159   GFRAA >60 06/27/2015 1112   GFRAA >60 12/01/2011 1159    No results found for: SPEP, UPEP  Lab Results  Component Value Date   WBC 6.2 06/27/2015   NEUTROABS 2.9 06/27/2015   HGB 11.8* 06/27/2015   HCT 35.6* 06/27/2015   MCV 92.7 06/27/2015   PLT 156 06/27/2015      Chemistry      Component Value Date/Time   NA 135 05/07/2015 1225   NA 134* 12/01/2011 1159   K 4.5 05/07/2015 1225   K 4.7 12/01/2011 1159   CL 99 05/07/2015 1225   CL 99 12/01/2011 1159   CO2 32 05/07/2015 1225   CO2 29 12/01/2011 1159   BUN 19 05/07/2015 1225   BUN 12 12/01/2011 1159   CREATININE 0.90 06/27/2015 1112   CREATININE 0.70 12/01/2011 1159      Component Value Date/Time   CALCIUM 8.8* 06/27/2015 1112   CALCIUM 8.5 12/01/2011 1159   ALKPHOS 47 05/07/2015 1225   ALKPHOS 64 12/01/2011 1159   AST 11 05/07/2015 1225   AST 16 12/01/2011 1159   ALT 14 05/07/2015 1225   ALT 19 12/01/2011 1159   BILITOT 0.6 05/07/2015 1225   BILITOT 0.8 12/01/2011 1159       RADIOGRAPHIC STUDIES: I have personally reviewed the radiological images as listed and agreed with the findings in the report. No results found.   ASSESSMENT & PLAN:   # CMML- blood counts fairly within normal limits except for mildly low hemoglobin of 11.8. Patient continues to be on surveillance.  # MGUS- most recent September 2016 M spike 0.9 g/dL awaiting labs from today. We'll repeat SPEP in 6  months.  # Fatigue- question related to diabetes; patient's fasting blood sugars above 200-270; recommend follow up with PCP. He agrees.  Recommend labs in 3 months; follow-up with me in 6 months/labs.  25 minutes face-to-face with the patient; with more than 50% of time spent on counseling and coordination of medical care.  No orders of the defined types were placed in this encounter.   All questions were answered. The patient knows to call the clinic with any problems, questions or concerns. No barriers to learning was detected.     Cammie Sickle, MD 06/27/2015 12:12 PM

## 2015-06-30 LAB — PROTEIN ELECTROPHORESIS, SERUM
A/G RATIO SPE: 0.9 (ref 0.7–1.7)
ALBUMIN ELP: 3.2 g/dL (ref 2.9–4.4)
ALPHA-1-GLOBULIN: 0.3 g/dL (ref 0.0–0.4)
ALPHA-2-GLOBULIN: 0.6 g/dL (ref 0.4–1.0)
BETA GLOBULIN: 0.9 g/dL (ref 0.7–1.3)
GAMMA GLOBULIN: 1.7 g/dL (ref 0.4–1.8)
Globulin, Total: 3.5 g/dL (ref 2.2–3.9)
M-Spike, %: 0.8 g/dL — ABNORMAL HIGH
Total Protein ELP: 6.7 g/dL (ref 6.0–8.5)

## 2015-08-07 ENCOUNTER — Ambulatory Visit: Payer: Medicare Other | Admitting: Family Medicine

## 2015-08-19 ENCOUNTER — Ambulatory Visit: Payer: Medicare Other | Admitting: Family Medicine

## 2015-09-01 ENCOUNTER — Ambulatory Visit (INDEPENDENT_AMBULATORY_CARE_PROVIDER_SITE_OTHER): Payer: Medicare Other | Admitting: Family Medicine

## 2015-09-01 ENCOUNTER — Encounter: Payer: Self-pay | Admitting: Family Medicine

## 2015-09-01 VITALS — BP 116/68 | HR 87 | Temp 98.0°F | Wt 209.0 lb

## 2015-09-01 DIAGNOSIS — E538 Deficiency of other specified B group vitamins: Secondary | ICD-10-CM

## 2015-09-01 DIAGNOSIS — I4892 Unspecified atrial flutter: Secondary | ICD-10-CM | POA: Diagnosis not present

## 2015-09-01 DIAGNOSIS — E118 Type 2 diabetes mellitus with unspecified complications: Secondary | ICD-10-CM

## 2015-09-01 DIAGNOSIS — I10 Essential (primary) hypertension: Secondary | ICD-10-CM | POA: Diagnosis not present

## 2015-09-01 LAB — COMPREHENSIVE METABOLIC PANEL
ALBUMIN: 3.7 g/dL (ref 3.5–5.2)
ALT: 11 U/L (ref 0–53)
AST: 9 U/L (ref 0–37)
Alkaline Phosphatase: 57 U/L (ref 39–117)
BUN: 20 mg/dL (ref 6–23)
CHLORIDE: 100 meq/L (ref 96–112)
CO2: 31 meq/L (ref 19–32)
Calcium: 9.2 mg/dL (ref 8.4–10.5)
Creatinine, Ser: 0.87 mg/dL (ref 0.40–1.50)
GFR: 87.98 mL/min (ref >60.00)
Glucose, Bld: 158 mg/dL — ABNORMAL HIGH (ref 70–99)
POTASSIUM: 4.8 meq/L (ref 3.5–5.1)
SODIUM: 136 meq/L (ref 135–145)
Total Bilirubin: 0.6 mg/dL (ref 0.2–1.2)
Total Protein: 7.5 g/dL (ref 6.0–8.3)

## 2015-09-01 LAB — HEMOGLOBIN A1C: Hgb A1c MFr Bld: 9.7 % — ABNORMAL HIGH (ref 4.6–6.5)

## 2015-09-01 LAB — VITAMIN B12: VITAMIN B 12: 452 pg/mL (ref 211–911)

## 2015-09-01 NOTE — Progress Notes (Signed)
Subjective:   Patient ID: Nathan Cunningham, male    DOB: [birth year], 82 y.o.   MRN: [id number]  Nathan Cunningham is a pleasant 82 y.o. year old male who presents to clinic today with Follow-up and Mouth Lesions  on 09/01/2015  HPI:  DM- taking glipizide and Kombiglyze, and Januvia. No noticeable side effects.  Checking FSBS three times weekly.  Brings in his log today- ranging 140s-150s fasting.  Denies any symptoms of hypoglycemia.  Weight is stable. Wt Readings from Last 3 Encounters:  09/01/15 209 lb (94.802 kg)  06/27/15 211 lb 6.7 oz (95.9 kg)  05/09/15 210 lb 8 oz (95.482 kg)     Urine microalbumin pos in 04/2015. Denies any episodes of hypoglycemia.    LDL at goal.  Pneumococcal vaccines UTD.  LDL has been at goal.  Lab Results  Component Value Date   HGBA1C 7.1* 05/07/2015   Lab Results  Component Value Date   CHOL 138 02/04/2015   HDL 34.80* 02/04/2015   LDLCALC 77 06/10/2010   LDLDIRECT 81.0 02/04/2015   TRIG 209.0* 02/04/2015   CHOLHDL 4 02/04/2015   Lab Results  Component Value Date   WBC 6.2 06/27/2015   HGB 11.8* 06/27/2015   HCT 35.6* 06/27/2015   MCV 92.7 06/27/2015   PLT 156 06/27/2015    Vit B12 def- had been receiving monthly IM injections but since his B12 was >1500 when I checked it in 03/2014, changed schedule of B12 injections to every other month.   Lab Results  Component Value Date   VITAMINB12 >1500* 05/07/2015   Lab Results  Component Value Date   PSA 0.05* 03/19/2014   PSA 0.08* 03/25/2008   PSA 0.09* 03/23/2007     Current Outpatient Prescriptions on File Prior to Visit  Medication Sig Dispense Refill  . amitriptyline (ELAVIL) 75 MG tablet TAKE 1 BY MOUTH AT BEDTIME 90 tablet 0  . aspirin 325 MG tablet Take 162 mg by mouth daily.    . Blood Glucose Monitoring Suppl (ONE TOUCH ULTRA SYSTEM KIT) W/DEVICE KIT 1 kit by Does not apply route once. 1 each 0  . Chromium Picolinate (RA CHROMIUM PICOLINATE) 400 MCG TABS Take 1  tablet by mouth daily.      . cyanocobalamin (,VITAMIN B-12,) 1000 MCG/ML injection Inject 1016mg every 60days IM (every other month) 1 mL 0  . docusate sodium (COLACE) 100 MG capsule Take 100 mg by mouth 3 (three) times daily as needed.      . dorzolamide-timolol (COSOPT) 22.3-6.8 MG/ML ophthalmic solution 1 drop 2 (two) times daily.      . fish oil-omega-3 fatty acids 1000 MG capsule Take by mouth. Take six daily    . glipiZIDE (GLUCOTROL XL) 5 MG 24 hr tablet Take 1 tablet (5 mg total) by mouth daily. 90 tablet 1  . glucose blood (ONE TOUCH ULTRA TEST) test strip USE AS DIRECTED TO TEST BLOOD SUGAR ONCE DAILY 100 each 1  . JANUVIA 50 MG tablet TAKE 1 BY MOUTH DAILY 90 tablet 1  . Lancets (ONETOUCH ULTRASOFT) lancets Use as instructed 100 each 0  . Linaclotide (LINZESS) 145 MCG CAPS capsule Take 1 capsule (145 mcg total) by mouth daily. 30 capsule   . Magnesium-Calcium-Folic Acid (MAGNEBIND 4497 400-200-1 MG TABS Take 1 tablet by mouth daily.      . Melatonin 5 MG CAPS Take 1 each by mouth daily.      . Multiple Vitamins-Minerals (EYE-VITES) TABS Take 1 tablet by mouth  2 (two) times daily.      . Potassium Gluconate (K-99) 595 MG CAPS Take 1 each by mouth daily.      . Saxagliptin-Metformin (KOMBIGLYZE XR) 12-998 MG TB24 Take 1 each by mouth daily. 90 tablet 3  . tadalafil (CIALIS) 5 MG tablet Take 1 tablet (5 mg total) by mouth daily as needed for erectile dysfunction. 10 tablet 0  . tamsulosin (FLOMAX) 0.4 MG CAPS capsule Take 1 capsule (0.4 mg total) by mouth daily. 90 capsule 3  . telmisartan (MICARDIS) 80 MG tablet TAKE 1 TABLET DAILY 90 tablet 3   No current facility-administered medications on file prior to visit.    No Known Allergies  Past Medical History  Diagnosis Date  . Hypertension   . Diabetes mellitus   . Hyperlipidemia   . Macular degeneration     Right eye  . Constipation   . ETOH abuse     binge drinking  . Dizziness   . Diverticulosis   . Adenomatous polyp  of colon 03/2003  . Hemorrhoids   . Sleep apnea     on CPAP    Past Surgical History  Procedure Laterality Date  . Appendectomy      age 62  . Cataract extraction      Left eye  . Inguinal hernia repair      age 30  . Cevical fusion    . Cardiac defibrillator placement    . Tonsillectomy      Family History  Problem Relation Age of Onset  . Heart disease Father   . Stroke Father 26    Social History   Social History  . Marital Status: Unknown    Spouse Name: N/A  . Number of Children: 2  . Years of Education: N/A   Occupational History  . retired    Social History Main Topics  . Smoking status: Never Smoker   . Smokeless tobacco: Never Used  . Alcohol Use: 7.0 oz/week    14 drink(s) per week  . Drug Use: No  . Sexual Activity: No   Other Topics Concern  . Not on file   Social History Narrative   Retired from [employer]   Goes to [employer]- [employer] sneakers 3 times a week.   Has a girlfriend.   Does not have a living will yet.   Desires CPR, does not prolonged life support if futile.         The PMH, PSH, Social History, Family History, Medications, and allergies have been reviewed in [facility], and have been updated if relevant.    Review of Systems  Constitutional: Negative.   Eyes: Negative.   Respiratory: Negative.   Cardiovascular: Negative.   Endocrine: Negative.   Musculoskeletal: Negative.   Skin: Negative.   Neurological: Negative.   Hematological: Negative.   Psychiatric/Behavioral: Negative.   All other systems reviewed and are negative.        Objective:    BP 116/68 mmHg  Pulse 87  Temp(Src) 98 F (36.7 C) (Oral)  Wt 209 lb (94.802 kg)  SpO2 90%  Wt Readings from Last 3 Encounters:  09/01/15 209 lb (94.802 kg)  06/27/15 211 lb 6.7 oz (95.9 kg)  05/09/15 210 lb 8 oz (95.482 kg)    Physical Exam   Constitutional: He is oriented to person, place, and time. He appears well-developed and well-nourished. No distress.  HENT:  Head:  Normocephalic and atraumatic.  Eyes: Conjunctivae and EOM are normal. Pupils are  equal, round, and reactive to light.  Neck: Normal range of motion.  Cardiovascular: Normal rate, regular rhythm and intact distal pulses.  Pulmonary/Chest: Effort normal and breath sounds normal. No respiratory distress. He exhibits no tenderness.  Musculoskeletal: Normal range of motion. He exhibits no edema.  Neurological: He is alert and oriented to person, place, and time.  Skin: Skin is warm and dry. No rash noted. He is not diaphoretic. No erythema. No pallor.  Psychiatric: He has a normal mood and affect. His behavior is normal. Judgment and thought content normal.       Assessment & Plan:   Type 2 diabetes mellitus with complication, without long-term current use of insulin (HCC)  Essential hypertension  Atrial flutter, unspecified type ([place]) No Follow-up on file.

## 2015-09-01 NOTE — Assessment & Plan Note (Signed)
Has been under good control however readings higher this month. Continue current rx- check a1 c today- may need to adjust medications.

## 2015-09-01 NOTE — Progress Notes (Signed)
Pre visit review using our clinic review tool, if applicable. No additional management support is needed unless otherwise documented below in the visit note. 

## 2015-09-01 NOTE — Assessment & Plan Note (Signed)
Check Vit B12 today prior to receiving IM B12. The patient indicates understanding of these issues and agrees with the plan.

## 2015-09-01 NOTE — Assessment & Plan Note (Signed)
Well controlled.  No changes made. 

## 2015-09-03 ENCOUNTER — Other Ambulatory Visit: Payer: Self-pay | Admitting: Family Medicine

## 2015-09-03 MED ORDER — SITAGLIPTIN PHOSPHATE 100 MG PO TABS: 100.0000 mg | ORAL_TABLET | Freq: Every day | ORAL | Status: DC

## 2015-09-09 ENCOUNTER — Telehealth: Payer: Self-pay

## 2015-09-09 MED ORDER — TELMISARTAN 80 MG PO TABS: ORAL_TABLET | ORAL | Status: DC

## 2015-09-09 MED ORDER — TAMSULOSIN HCL 0.4 MG PO CAPS: 0.4000 mg | ORAL_CAPSULE | Freq: Every day | ORAL | Status: DC

## 2015-09-09 MED ORDER — SAXAGLIPTIN-METFORMIN ER 5-1000 MG PO TB24: 1.0000 | ORAL_TABLET | Freq: Every day | ORAL | Status: DC

## 2015-09-09 MED ORDER — AMITRIPTYLINE HCL 75 MG PO TABS: ORAL_TABLET | ORAL | Status: DC

## 2015-09-09 MED ORDER — GLIPIZIDE ER 5 MG PO TB24: 5.0000 mg | ORAL_TABLET | Freq: Every day | ORAL | Status: DC

## 2015-09-09 NOTE — Telephone Encounter (Signed)
Pt has changed mail order pharmacies to [facility] and request new rx for Januvia 100 mg per lab result note 09/03/15. Pt also request cb to verify since Januvia increased that pt should continue taking Glipizide. Pt has not checked FBS today and pt cannot find his recent FBS readings. Pt also request refills on amitriptylline, Komliglyze,glipizide, telmisartan and tamsulosin to [facility]. Advised pt done per protocol.

## 2015-09-09 NOTE — Telephone Encounter (Signed)
Yes he should continue Glipizide.  Ok to refill all rxs as requested to mail order pharmacy.

## 2015-09-10 ENCOUNTER — Other Ambulatory Visit: Payer: Self-pay | Admitting: Family Medicine

## 2015-09-17 ENCOUNTER — Encounter: Payer: Self-pay | Admitting: Primary Care

## 2015-09-17 ENCOUNTER — Ambulatory Visit (INDEPENDENT_AMBULATORY_CARE_PROVIDER_SITE_OTHER)
Admission: RE | Admit: 2015-09-17 | Discharge: 2015-09-17 | Disposition: A | Discharge status: Home / Self Care | Payer: Medicare Other | Source: Ambulatory Visit | Attending: Primary Care | Admitting: Primary Care

## 2015-09-17 ENCOUNTER — Other Ambulatory Visit: Payer: Self-pay | Admitting: Primary Care

## 2015-09-17 ENCOUNTER — Ambulatory Visit (INDEPENDENT_AMBULATORY_CARE_PROVIDER_SITE_OTHER): Payer: Medicare Other | Admitting: Primary Care

## 2015-09-17 VITALS — BP 126/68 | HR 87 | Temp 97.9°F | Ht 68.0 in | Wt 212.1 lb

## 2015-09-17 DIAGNOSIS — R05 Cough: Secondary | ICD-10-CM

## 2015-09-17 DIAGNOSIS — R059 Cough, unspecified: Secondary | ICD-10-CM

## 2015-09-17 MED ORDER — AZITHROMYCIN 250 MG PO TABS: ORAL_TABLET | ORAL | Status: DC

## 2015-09-17 MED ORDER — GUAIFENESIN-CODEINE 100-10 MG/5ML PO SYRP: 5.0000 mL | ORAL_SOLUTION | Freq: Every evening | ORAL | Status: DC | PRN

## 2015-09-17 NOTE — Patient Instructions (Signed)
You may take the Cheratussin cough syrup at bedtime for cough.  Continue Mucinex DM every 12 hours for congestion. Ensure you take this with a full glass of water.   Complete xray(s) prior to leaving today. I will notify you of your results once received.  It was a pleasure meeting you!

## 2015-09-17 NOTE — Progress Notes (Signed)
Subjective:    Patient ID: Nathan Cunningham, male    DOB: 01-20-1925, 82 y.o.   MRN: [id number]  HPI  Mr. Detweiler is an 82 year old male who presents today with a chief complaint of cough. He also reports nasal congestion, chest congestion, and hoarseness to his voice. His symptoms have been present for the past 3 days. His cough is productive but is not able to expel his mucous. Denies fevers, weakness, fatigue. He's taken Mucinex DM for his symptoms without improvement. Overall his symptoms are the same.   Review of Systems  Constitutional: Negative for fever, chills and fatigue.  HENT: Positive for congestion and voice change. Negative for sinus pressure.   Respiratory: Positive for cough. Negative for shortness of breath and wheezing.   Cardiovascular: Negative for chest pain.       Past Medical History  Diagnosis Date  . Hypertension   . Diabetes mellitus   . Hyperlipidemia   . Macular degeneration     Right eye  . Constipation   . ETOH abuse     binge drinking  . Dizziness   . Diverticulosis   . Adenomatous polyp of colon 03/2003  . Hemorrhoids   . Sleep apnea     on CPAP    Social History   Social History  . Marital Status: Unknown    Spouse Name: N/A  . Number of Children: 2  . Years of Education: N/A   Occupational History  . retired    Social History Main Topics  . Smoking status: Never Smoker   . Smokeless tobacco: Never Used  . Alcohol Use: 7.0 oz/week    14 drink(s) per week  . Drug Use: No  . Sexual Activity: No   Other Topics Concern  . Not on file   Social History Narrative   Retired from [employer]   Goes to [employer] sne[employer] 3 times a week.   Has a girlfriend.   Does not have a living will yet.   Desires CPR, does not prolonged life support if futile.          Past Surgical History  Procedure Laterality Date  . Appendectomy      age 52  . Cataract extraction      Left eye  . Inguinal hernia repair      age 75  .  Cevical fusion    . Cardiac defibrillator placement    . Tonsillectomy      Family History  Problem Relation Age of Onset  . Heart disease Father   . Stroke Father 43    No Known Allergies  Current Outpatient Prescriptions on File Prior to Visit  Medication Sig Dispense Refill  . amitriptyline (ELAVIL) 75 MG tablet TAKE 1 TABLET BY MOUTH AT BEDTIME 90 tablet 0  . aspirin 325 MG tablet Take 162 mg by mouth daily.    . Blood Glucose Monitoring Suppl (ONE TOUCH ULTRA SYSTEM KIT) W/DEVICE KIT 1 kit by Does not apply route once. 1 each 0  . Chromium Picolinate (RA CHROMIUM PICOLINATE) 400 MCG TABS Take 1 tablet by mouth daily.      . cyanocobalamin (,VITAMIN B-12,) 1000 MCG/ML injection Inject 1034mg every 60days IM (every other month) 1 mL 0  . docusate sodium (COLACE) 100 MG capsule Take 100 mg by mouth 3 (three) times daily as needed.      . dorzolamide-timolol (COSOPT) 22.3-6.8 MG/ML ophthalmic solution 1 drop 2 (two) times daily.      .Marland Kitchen  fish oil-omega-3 fatty acids 1000 MG capsule Take by mouth. Take six daily    . glipiZIDE (GLUCOTROL XL) 5 MG 24 hr tablet Take 1 tablet (5 mg total) by mouth daily. 90 tablet 1  . glucose blood (ONE TOUCH ULTRA TEST) test strip USE AS DIRECTED TO TEST BLOOD SUGAR ONCE DAILY 100 each 1  . Lancets (ONETOUCH ULTRASOFT) lancets Use as instructed 100 each 0  . Linaclotide (LINZESS) 145 MCG CAPS capsule Take 1 capsule (145 mcg total) by mouth daily. 30 capsule   . Magnesium-Calcium-Folic Acid (MAGNEBIND 161) 400-200-1 MG TABS Take 1 tablet by mouth daily.      . Melatonin 5 MG CAPS Take 1 each by mouth daily.      . Multiple Vitamins-Minerals (EYE-VITES) TABS Take 1 tablet by mouth 2 (two) times daily.      . Potassium Gluconate (K-99) 595 MG CAPS Take 1 each by mouth daily.      . Saxagliptin-Metformin (KOMBIGLYZE XR) 12-998 MG TB24 Take 1 each by mouth daily. 90 tablet 1  . sitaGLIPtin (JANUVIA) 100 MG tablet Take 1 tablet (100 mg total) by mouth daily.  90 tablet 3  . tadalafil (CIALIS) 5 MG tablet Take 1 tablet (5 mg total) by mouth daily as needed for erectile dysfunction. 10 tablet 0  . tamsulosin (FLOMAX) 0.4 MG CAPS capsule Take 1 capsule (0.4 mg total) by mouth daily. 90 capsule 3  . telmisartan (MICARDIS) 80 MG tablet TAKE 1 TABLET DAILY 90 tablet 3   No current facility-administered medications on file prior to visit.    BP 126/68 mmHg  Pulse 87  Temp(Src) 97.9 F (36.6 C) (Oral)  Ht 5' 8"  (1.727 m)  Wt 212 lb 1.9 oz (96.217 kg)  BMI 32.26 kg/m2  SpO2 96%    Objective:   Physical Exam  Constitutional: He appears well-nourished.  HENT:  Right Ear: Tympanic membrane and ear canal normal.  Left Ear: Tympanic membrane and ear canal normal.  Nose: Right sinus exhibits no maxillary sinus tenderness and no frontal sinus tenderness. Left sinus exhibits no maxillary sinus tenderness and no frontal sinus tenderness.  Mouth/Throat: Oropharynx is clear and moist.  Eyes: Conjunctivae are normal.  Neck: Neck supple.  Cardiovascular: Normal rate and regular rhythm.   Pulmonary/Chest: Effort normal and breath sounds normal. He has no wheezes. He has no rales.  Skin: Skin is warm and dry.          Assessment & Plan:  URI:  Cough, chest congestion, hoarseness to voice x 3 days. Overall minimal improvement with Mucinex, but has only taken for 24 hours. Exam with clear lungs, normal HENT. Vitals stable. Does not appear ill/sickly. Suspect viral involvement at this point and will treat with supportive meausures. Mucinex with water, flonase, Robitussin, RX for Cheratussin HS. Fluids, rest, return precautions provided.

## 2015-09-17 NOTE — Progress Notes (Signed)
Pre visit review using our clinic review tool, if applicable. No additional management support is needed unless otherwise documented below in the visit note. 

## 2015-09-18 ENCOUNTER — Telehealth: Payer: Self-pay | Admitting: Family Medicine

## 2015-09-18 NOTE — Telephone Encounter (Signed)
Called and notified patient's spouse of Kate's comments. 's spouse verbalized understanding.

## 2015-09-18 NOTE — Telephone Encounter (Signed)
Returned your call.

## 2015-09-26 ENCOUNTER — Telehealth: Payer: Self-pay | Admitting: *Deleted

## 2015-09-26 ENCOUNTER — Inpatient Hospital Stay: Payer: Medicare Other | Attending: Internal Medicine

## 2015-09-26 DIAGNOSIS — C9250 Acute myelomonocytic leukemia, not having achieved remission: Secondary | ICD-10-CM

## 2015-09-26 DIAGNOSIS — C9390 Monocytic leukemia, unspecified, not having achieved remission: Secondary | ICD-10-CM | POA: Diagnosis not present

## 2015-09-26 DIAGNOSIS — D472 Monoclonal gammopathy: Secondary | ICD-10-CM

## 2015-09-26 LAB — CBC WITH DIFFERENTIAL/PLATELET
BAND NEUTROPHILS: 2 %
BASOS PCT: 1 %
Basophils Absolute: 0.1 10*3/uL (ref 0–0.1)
Eosinophils Absolute: 0.1 10*3/uL (ref 0–0.7)
Eosinophils Relative: 1 %
HCT: 33.7 % — ABNORMAL LOW (ref 40.0–52.0)
HEMOGLOBIN: 11.4 g/dL — AB (ref 13.0–18.0)
LYMPHS ABS: 2.1 10*3/uL (ref 1.0–3.6)
Lymphocytes Relative: 27 %
MCH: 30.8 pg (ref 26.0–34.0)
MCHC: 33.7 g/dL (ref 32.0–36.0)
MCV: 91.5 fL (ref 80.0–100.0)
MONO ABS: 1.6 10*3/uL — AB (ref 0.2–1.0)
Monocytes Relative: 20 %
Neutro Abs: 3.9 10*3/uL (ref 1.4–6.5)
Neutrophils Relative %: 49 %
PLATELETS: 217 10*3/uL (ref 150–440)
RBC: 3.69 MIL/uL — ABNORMAL LOW (ref 4.40–5.90)
RDW: 14.4 % (ref 11.5–14.5)
WBC: 7.8 10*3/uL (ref 3.8–10.6)

## 2015-09-26 NOTE — Telephone Encounter (Signed)
-----   Message from Cammie Sickle, MD sent at 09/26/2015 12:38 PM EST ----- Please inform patient that labs looks stable;  no new recommendations follow-up as planned.

## 2015-09-29 DIAGNOSIS — H401132 Primary open-angle glaucoma, bilateral, moderate stage: Secondary | ICD-10-CM | POA: Diagnosis not present

## 2015-09-29 DIAGNOSIS — H2511 Age-related nuclear cataract, right eye: Secondary | ICD-10-CM | POA: Diagnosis not present

## 2015-09-29 NOTE — Telephone Encounter (Signed)
Spoke with patient. Results provided. Teach back process performed with lab test results.

## 2015-10-01 NOTE — Telephone Encounter (Signed)
Ok to refill as requested 

## 2015-10-01 NOTE — Telephone Encounter (Signed)
Spoke to pt and informed him that he is not to take 2tabs of  50mg . Pt was originally prescribed 5omg, and was advised to take two tabs, until they ran out, and a 100mg  tab was sent to mail order in Jan 2017. He was given a 90D supply and is not due until April for another Rx, which can then be sent to [facility]

## 2015-10-01 NOTE — Telephone Encounter (Signed)
[facility] called and needs new prescription for Januvia 2 per day as stated in lab noes from PCP 09/03/15.  [facility] [facility] in [place], [place]  Phone  [phone] / Fax# [phone] / Electronic Mail to [id number], [place], [place].  Pt called in and would also like a confirmation call when completed to [phone]

## 2015-10-02 ENCOUNTER — Other Ambulatory Visit: Payer: Self-pay

## 2015-10-02 MED ORDER — SITAGLIPTIN PHOSPHATE 100 MG PO TABS: 100.0000 mg | ORAL_TABLET | Freq: Every day | ORAL | Status: DC

## 2015-10-02 NOTE — Telephone Encounter (Signed)
[facility] calling for [facility]; [facility] no longer has [facility]. Refill done per protocol.Marland Kitchen

## 2015-10-03 ENCOUNTER — Telehealth: Payer: Self-pay

## 2015-10-03 NOTE — Telephone Encounter (Signed)
Spoke to pt and advised per Dr [name]. Pt states he will contact cancer center to confirm and will have them fax over [place] notes

## 2015-10-03 NOTE — Telephone Encounter (Signed)
According to phone note from cancer center to him, no new recommendations were made.  Can we get notes from them to confirm this?

## 2015-10-03 NOTE — Telephone Encounter (Signed)
Pt left v/m; pt received call from cancer center with lab results; pt was advised he is anemic; pt wants to know if needs to be on any medication or is there anything else that needs to be done since pt is anemic. Pt request cb.

## 2015-10-27 DIAGNOSIS — E1142 Type 2 diabetes mellitus with diabetic polyneuropathy: Secondary | ICD-10-CM | POA: Diagnosis not present

## 2015-10-27 DIAGNOSIS — B351 Tinea unguium: Secondary | ICD-10-CM | POA: Diagnosis not present

## 2015-11-19 DIAGNOSIS — E118 Type 2 diabetes mellitus with unspecified complications: Secondary | ICD-10-CM | POA: Diagnosis not present

## 2015-11-25 DIAGNOSIS — L820 Inflamed seborrheic keratosis: Secondary | ICD-10-CM | POA: Diagnosis not present

## 2015-11-25 DIAGNOSIS — B360 Pityriasis versicolor: Secondary | ICD-10-CM | POA: Diagnosis not present

## 2015-11-25 DIAGNOSIS — L578 Other skin changes due to chronic exposure to nonionizing radiation: Secondary | ICD-10-CM | POA: Diagnosis not present

## 2015-12-04 ENCOUNTER — Encounter: Payer: Self-pay | Admitting: *Deleted

## 2015-12-04 ENCOUNTER — Encounter: Payer: Self-pay | Admitting: Family Medicine

## 2015-12-04 ENCOUNTER — Ambulatory Visit (INDEPENDENT_AMBULATORY_CARE_PROVIDER_SITE_OTHER): Payer: Medicare Other | Admitting: Family Medicine

## 2015-12-04 VITALS — BP 120/60 | HR 84 | Temp 97.6°F | Wt 208.2 lb

## 2015-12-04 DIAGNOSIS — I10 Essential (primary) hypertension: Secondary | ICD-10-CM | POA: Diagnosis not present

## 2015-12-04 DIAGNOSIS — E118 Type 2 diabetes mellitus with unspecified complications: Secondary | ICD-10-CM

## 2015-12-04 DIAGNOSIS — E538 Deficiency of other specified B group vitamins: Secondary | ICD-10-CM

## 2015-12-04 DIAGNOSIS — R5382 Chronic fatigue, unspecified: Secondary | ICD-10-CM

## 2015-12-04 DIAGNOSIS — R5381 Other malaise: Secondary | ICD-10-CM

## 2015-12-04 LAB — LIPID PANEL
CHOL/HDL RATIO: 4
Cholesterol: 123 mg/dL (ref 0–200)
HDL: 30.9 mg/dL — ABNORMAL LOW (ref >39.00)
LDL CALC: 53 mg/dL (ref 0–99)
NONHDL: 92.55
Triglycerides: 199 mg/dL — ABNORMAL HIGH (ref 0.0–149.0)
VLDL: 39.8 mg/dL (ref 0.0–40.0)

## 2015-12-04 LAB — HEMOGLOBIN A1C: Hgb A1c MFr Bld: 6.8 % — ABNORMAL HIGH (ref 4.6–6.5)

## 2015-12-04 LAB — COMPREHENSIVE METABOLIC PANEL
ALK PHOS: 45 U/L (ref 39–117)
ALT: 12 U/L (ref 0–53)
AST: 10 U/L (ref 0–37)
Albumin: 3.6 g/dL (ref 3.5–5.2)
BUN: 17 mg/dL (ref 6–23)
CALCIUM: 9.5 mg/dL (ref 8.4–10.5)
CO2: 31 mEq/L (ref 19–32)
Chloride: 99 mEq/L (ref 96–112)
Creatinine, Ser: 0.86 mg/dL (ref 0.40–1.50)
GFR: 89.11 mL/min (ref >60.00)
GLUCOSE: 136 mg/dL — AB (ref 70–99)
POTASSIUM: 4.9 meq/L (ref 3.5–5.1)
Sodium: 137 mEq/L (ref 135–145)
TOTAL PROTEIN: 7.5 g/dL (ref 6.0–8.3)
Total Bilirubin: 0.6 mg/dL (ref 0.2–1.2)

## 2015-12-04 LAB — VITAMIN B12: Vitamin B-12: >1500 pg/mL — ABNORMAL HIGH (ref 211–911)

## 2015-12-04 MED ORDER — CYANOCOBALAMIN 1000 MCG/ML IJ SOLN
1000.0000 ug | Freq: Once | INTRAMUSCULAR | Status: AC
  Administered 2015-12-04: 1000 ug via INTRAMUSCULAR

## 2015-12-04 NOTE — Progress Notes (Signed)
Pre visit review using our clinic review tool, if applicable. No additional management support is needed unless otherwise documented below in the visit note. 

## 2015-12-04 NOTE — Patient Instructions (Signed)
Great to see you. We will call you with your results from today. 

## 2015-12-04 NOTE — Assessment & Plan Note (Signed)
Continue current rx and recheck labs today.

## 2015-12-04 NOTE — Progress Notes (Signed)
Subjective:   Patient ID: Nathan Cunningham, male    DOB: [birth year], 82 y.o.   MRN: [id number]  Nathan Cunningham is a pleasant 82 y.o. year old male who presents to clinic today with Follow-up and Cough  on 12/04/2015  HPI:  DM- taking glipizide and Kombiglyze, and Januvia. No noticeable side effects.  Checking FSBS three times weekly.  Brings in his log today- ranging 123s  fasting.  Denies any symptoms of hypoglycemia.  Attended diabetic teaching at [facility] and felt that it was very helpful. a1c was quite elevated 3 months ago, Januvia increased to 100 mg daily at that time.  Wt Readings from Last 3 Encounters:  12/04/15 208 lb 4 oz (94.462 kg)  09/17/15 212 lb 1.9 oz (96.217 kg)  09/01/15 209 lb (94.802 kg)     Urine microalbumin pos in 04/2015. Denies any episodes of hypoglycemia.    LDL at goal.  Eye exam due.  Also sees him for macular degeneration.  Pneumococcal vaccines UTD.  LDL has been at goal.  Lab Results  Component Value Date   HGBA1C 9.7* 09/01/2015   Lab Results  Component Value Date   CHOL 138 02/04/2015   HDL 34.80* 02/04/2015   LDLCALC 77 06/10/2010   LDLDIRECT 81.0 02/04/2015   TRIG 209.0* 02/04/2015   CHOLHDL 4 02/04/2015   Lab Results  Component Value Date   WBC 7.8 09/26/2015   HGB 11.4* 09/26/2015   HCT 33.7* 09/26/2015   MCV 91.5 09/26/2015   PLT 217 09/26/2015    Vit B12 def- has been receiving injections.  Lab Results  Component Value Date   EXBMWUXL24 401 09/01/2015   Lab Results  Component Value Date   PSA 0.05* 03/19/2014   PSA 0.08* 03/25/2008   PSA 0.09* 03/23/2007     Current Outpatient Prescriptions on File Prior to Visit  Medication Sig Dispense Refill  . amitriptyline (ELAVIL) 75 MG tablet TAKE 1 TABLET BY MOUTH AT BEDTIME 90 tablet 0  . aspirin 325 MG tablet Take 162 mg by mouth daily.    . Blood Glucose Monitoring Suppl (ONE TOUCH ULTRA SYSTEM KIT) W/DEVICE KIT 1 kit by Does not apply route once. 1 each 0  .  Chromium Picolinate (RA CHROMIUM PICOLINATE) 400 MCG TABS Take 1 tablet by mouth daily.      . cyanocobalamin (,VITAMIN B-12,) 1000 MCG/ML injection Inject 1069mg every 60days IM (every other month) 1 mL 0  . docusate sodium (COLACE) 100 MG capsule Take 100 mg by mouth 3 (three) times daily as needed.      . dorzolamide-timolol (COSOPT) 22.3-6.8 MG/ML ophthalmic solution 1 drop 2 (two) times daily.      . fish oil-omega-3 fatty acids 1000 MG capsule Take by mouth. Take six daily    . glipiZIDE (GLUCOTROL XL) 5 MG 24 hr tablet Take 1 tablet (5 mg total) by mouth daily. 90 tablet 1  . glucose blood (ONE TOUCH ULTRA TEST) test strip USE AS DIRECTED TO TEST BLOOD SUGAR ONCE DAILY 100 each 1  . Lancets (ONETOUCH ULTRASOFT) lancets Use as instructed 100 each 0  . Linaclotide (LINZESS) 145 MCG CAPS capsule Take 1 capsule (145 mcg total) by mouth daily. 30 capsule   . Magnesium-Calcium-Folic Acid (MAGNEBIND 4027 400-200-1 MG TABS Take 1 tablet by mouth daily.      . Melatonin 5 MG CAPS Take 1 each by mouth daily.      . Multiple Vitamins-Minerals (EYE-VITES) TABS Take 1 tablet by mouth  2 (two) times daily.      . Potassium Gluconate (K-99) 595 MG CAPS Take 1 each by mouth daily.      . Saxagliptin-Metformin (KOMBIGLYZE XR) 12-998 MG TB24 Take 1 each by mouth daily. 90 tablet 1  . sitaGLIPtin (JANUVIA) 100 MG tablet Take 1 tablet (100 mg total) by mouth daily. 90 tablet 3  . tadalafil (CIALIS) 5 MG tablet Take 1 tablet (5 mg total) by mouth daily as needed for erectile dysfunction. 10 tablet 0  . tamsulosin (FLOMAX) 0.4 MG CAPS capsule Take 1 capsule (0.4 mg total) by mouth daily. 90 capsule 3  . telmisartan (MICARDIS) 80 MG tablet TAKE 1 TABLET DAILY 90 tablet 3   No current facility-administered medications on file prior to visit.    No Known Allergies  Past Medical History  Diagnosis Date  . Hypertension   . Diabetes mellitus   . Hyperlipidemia   . Macular degeneration     Right eye  .  Constipation   . ETOH abuse     binge drinking  . Dizziness   . Diverticulosis   . Adenomatous polyp of colon 03/2003  . Hemorrhoids   . Sleep apnea     on CPAP    Past Surgical History  Procedure Laterality Date  . Appendectomy      age 44  . Cataract extraction      Left eye  . Inguinal hernia repair      age 58  . Cevical fusion    . Cardiac defibrillator placement    . Tonsillectomy      Family History  Problem Relation Age of Onset  . Heart disease Father   . Stroke Father 45    Social History   Social History  . Marital Status: Unknown    Spouse Name: N/A  . Number of Children: 2  . Years of Education: N/A   Occupational History  . retired    Social History Main Topics  . Smoking status: Never Smoker   . Smokeless tobacco: Never Used  . Alcohol Use: 7.0 oz/week    14 drink(s) per week  . Drug Use: No  . Sexual Activity: No   Other Topics Concern  . Not on file   Social History Narrative   Retired from [employer]   Goes to [employer] sneakers 3 times a week.   Has a girlfriend.   Does not have a living will yet.   Desires CPR, does not prolonged life support if futile.         The PMH, PSH, Social History, Family History, Medications, and allergies have been reviewed in [facility], and have been updated if relevant.    Review of Systems  Constitutional: Negative.   Eyes: Negative.   Respiratory: Negative.   Cardiovascular: Negative.   Endocrine: Negative.   Musculoskeletal: Negative.   Skin: Negative.   Neurological: Negative.   Hematological: Negative.   Psychiatric/Behavioral: Negative.   All other systems reviewed and are negative.        Objective:    BP 120/60 mmHg  Pulse 84  Temp(Src) 97.6 F (36.4 C) (Oral)  Wt 208 lb 4 oz (94.462 kg)  SpO2 91%  Wt Readings from Last 3 Encounters:  12/04/15 208 lb 4 oz (94.462 kg)  09/17/15 212 lb 1.9 oz (96.217 kg)  09/01/15 209 lb (94.802 kg)    Physical Exam   Constitutional: He  is oriented to person, place, and time. He appears  well-developed and well-nourished. No distress.  HENT:  Head: Normocephalic and atraumatic.  Eyes: Conjunctivae and EOM are normal. Pupils are equal, round, and reactive to light.  Neck: Normal range of motion.  Cardiovascular: Normal rate, regular rhythm and intact distal pulses.  Pulmonary/Chest: Effort normal and breath sounds normal. No respiratory distress. He exhibits no tenderness.  Musculoskeletal: Normal range of motion. He exhibits no edema.  Neurological: He is alert and oriented to person, place, and time.  Skin: Skin is warm and dry. No rash noted. He is not diaphoretic. No erythema. No pallor.  Psychiatric: He has a normal mood and affect. His behavior is normal. Judgment and thought content normal.       Assessment & Plan:   Type 2 diabetes mellitus with complication, without long-term current use of insulin (HCC) - Plan: Hemoglobin A1c, Lipid panel, Comprehensive metabolic panel  Y33 deficiency - Plan: Vitamin B12  Chronic fatigue and malaise  Essential hypertension No Follow-up on file.

## 2015-12-04 NOTE — Assessment & Plan Note (Signed)
Well controlled. No changes made to rxs today. 

## 2015-12-04 NOTE — Assessment & Plan Note (Signed)
IM b12 given today. Check B12 level as well.

## 2015-12-05 ENCOUNTER — Encounter: Payer: Self-pay | Admitting: *Deleted

## 2015-12-17 DIAGNOSIS — L03119 Cellulitis of unspecified part of limb: Secondary | ICD-10-CM | POA: Diagnosis not present

## 2015-12-18 ENCOUNTER — Telehealth: Payer: Self-pay

## 2015-12-18 NOTE — Telephone Encounter (Signed)
[facility]; given abx and area dressed. Mrs Dyce will cb for f/u appt.

## 2015-12-18 NOTE — Telephone Encounter (Signed)
PLEASE NOTE: All timestamps contained within this report are represented as [place] Standard Time. CONFIDENTIALTY NOTICE: This fax transmission is intended only for the addressee. It contains information that is legally privileged, confidential or otherwise protected from use or disclosure. If you are not the intended recipient, you are strictly prohibited from reviewing, disclosing, copying using or disseminating any of this information or taking any action in reliance on or regarding this information. If you have received this fax in error, please notify us immediately by telephone so that we can arrange for its return to [place]. Phone: [phone], Toll-Free: [phone], Fax: [phone] Page: 1 of 2 Call Id: [id number] [place] Patient Name: Nathan Cunningham Gender: Male DOB: [birth year] Age: 82 Y 37 M 2 D Return Phone Number: [phone] (Primary) Address: City/State/Zip: [place] Client [place] Night - Client Client Site [place] - Night Contact Type Call Who Is Calling Patient / Member / Family / Caregiver Call Type Triage / Clinical Caller Name [name] Relationship To Patient Daughter Return Phone Number [phone] (Primary) Chief Complaint Sores Reason for Call Symptomatic / Request for Health Information Initial Comment My father is diabetic has has a wound PreDisposition Go to ED Translation No Nurse Assessment Nurse: Windle Guard, RN, Lesa Date/Time (Eastern Time): 12/17/2015 5:37:19 PM Confirm and document reason for call. If symptomatic, describe symptoms. You must click the next button to save text entered. ---Caller states her father has a sore on his right leg Has the patient traveled out of the country within the last 30 days? ---No Does the patient have any new or worsening symptoms? ---Yes Will a triage be completed?  ---Yes Related visit to physician within the last 2 weeks? ---No Does the PT have any chronic conditions? (i.e. diabetes, asthma, etc.) ---Yes List chronic conditions. ---diabetes, HTN, Is this a behavioral health or substance abuse call? ---No Guidelines Guideline Title Affirmed Question Affirmed Notes Nurse Date/Time (Eastern Time) Sores [1] Looks infected (spreading redness, pus) AND [2] large red area (> 2 in. or 5 cm) Conner, RN, Lesa 12/17/2015 5:39:50 PM Disp. Time Eilene Ghazi Time) Disposition Final User 12/17/2015 5:42:42 PM See Physician within 4 Hours (or PCP triage) Yes Conner, RN, Lesa PLEASE NOTE: All timestamps contained within this report are represented as [place] Standard Time. CONFIDENTIALTY NOTICE: This fax transmission is intended only for the addressee. It contains information that is legally privileged, confidential or otherwise protected from use or disclosure. If you are not the intended recipient, you are strictly prohibited from reviewing, disclosing, copying using or disseminating any of this information or taking any action in reliance on or regarding this information. If you have received this fax in error, please notify us immediately by telephone so that we can arrange for its return to [place]. Phone: [phone], Toll-Free: [phone], Fax: [phone] Page: 2 of 2 Call Id: [id number] Caller Understands: Yes Disagree/Comply: Comply Care Advice Given Per Guideline SEE PHYSICIAN WITHIN 4 HOURS (or PCP triage): * IF OFFICE WILL BE OPEN: You need to be seen within the next 3 or 4 hours. Call your doctor's office now or as soon as it opens. CALL BACK IF: * You become worse. CARE ADVICE given per Sores (Adult) guideline. Referrals GO TO FACILITY UNDECIDED

## 2015-12-22 ENCOUNTER — Emergency Department: Payer: Medicare Other

## 2015-12-22 ENCOUNTER — Encounter: Payer: Self-pay | Admitting: *Deleted

## 2015-12-22 ENCOUNTER — Emergency Department
Admission: EM | Admit: 2015-12-22 | Discharge: 2015-12-22 | Disposition: A | Discharge status: Home / Self Care | Payer: Medicare Other | Attending: Emergency Medicine | Admitting: Emergency Medicine

## 2015-12-22 DIAGNOSIS — S6992XA Unspecified injury of left wrist, hand and finger(s), initial encounter: Secondary | ICD-10-CM | POA: Diagnosis present

## 2015-12-22 DIAGNOSIS — M25552 Pain in left hip: Secondary | ICD-10-CM | POA: Diagnosis not present

## 2015-12-22 DIAGNOSIS — R51 Headache: Secondary | ICD-10-CM | POA: Diagnosis not present

## 2015-12-22 DIAGNOSIS — T148 Other injury of unspecified body region: Secondary | ICD-10-CM | POA: Diagnosis not present

## 2015-12-22 DIAGNOSIS — Z79899 Other long term (current) drug therapy: Secondary | ICD-10-CM | POA: Insufficient documentation

## 2015-12-22 DIAGNOSIS — W19XXXA Unspecified fall, initial encounter: Secondary | ICD-10-CM

## 2015-12-22 DIAGNOSIS — W1839XA Other fall on same level, initial encounter: Secondary | ICD-10-CM | POA: Diagnosis not present

## 2015-12-22 DIAGNOSIS — S3992XA Unspecified injury of lower back, initial encounter: Secondary | ICD-10-CM | POA: Diagnosis not present

## 2015-12-22 DIAGNOSIS — S50311A Abrasion of right elbow, initial encounter: Secondary | ICD-10-CM | POA: Diagnosis not present

## 2015-12-22 DIAGNOSIS — S51012A Laceration without foreign body of left elbow, initial encounter: Secondary | ICD-10-CM | POA: Insufficient documentation

## 2015-12-22 DIAGNOSIS — Y939 Activity, unspecified: Secondary | ICD-10-CM | POA: Insufficient documentation

## 2015-12-22 DIAGNOSIS — S3993XA Unspecified injury of pelvis, initial encounter: Secondary | ICD-10-CM | POA: Diagnosis not present

## 2015-12-22 DIAGNOSIS — Y929 Unspecified place or not applicable: Secondary | ICD-10-CM | POA: Diagnosis not present

## 2015-12-22 DIAGNOSIS — M545 Low back pain: Secondary | ICD-10-CM | POA: Diagnosis not present

## 2015-12-22 DIAGNOSIS — M5134 Other intervertebral disc degeneration, thoracic region: Secondary | ICD-10-CM | POA: Insufficient documentation

## 2015-12-22 DIAGNOSIS — S299XXA Unspecified injury of thorax, initial encounter: Secondary | ICD-10-CM | POA: Diagnosis not present

## 2015-12-22 DIAGNOSIS — M25551 Pain in right hip: Secondary | ICD-10-CM | POA: Diagnosis not present

## 2015-12-22 DIAGNOSIS — Z856 Personal history of leukemia: Secondary | ICD-10-CM | POA: Diagnosis not present

## 2015-12-22 DIAGNOSIS — E785 Hyperlipidemia, unspecified: Secondary | ICD-10-CM | POA: Diagnosis not present

## 2015-12-22 DIAGNOSIS — S51002A Unspecified open wound of left elbow, initial encounter: Secondary | ICD-10-CM | POA: Diagnosis not present

## 2015-12-22 DIAGNOSIS — M546 Pain in thoracic spine: Secondary | ICD-10-CM | POA: Diagnosis not present

## 2015-12-22 DIAGNOSIS — E119 Type 2 diabetes mellitus without complications: Secondary | ICD-10-CM | POA: Diagnosis not present

## 2015-12-22 DIAGNOSIS — Y999 Unspecified external cause status: Secondary | ICD-10-CM | POA: Insufficient documentation

## 2015-12-22 DIAGNOSIS — Z7982 Long term (current) use of aspirin: Secondary | ICD-10-CM | POA: Diagnosis not present

## 2015-12-22 DIAGNOSIS — S0990XA Unspecified injury of head, initial encounter: Secondary | ICD-10-CM | POA: Diagnosis not present

## 2015-12-22 DIAGNOSIS — I10 Essential (primary) hypertension: Secondary | ICD-10-CM | POA: Insufficient documentation

## 2015-12-22 DIAGNOSIS — Z7984 Long term (current) use of oral hypoglycemic drugs: Secondary | ICD-10-CM | POA: Insufficient documentation

## 2015-12-22 DIAGNOSIS — M549 Dorsalgia, unspecified: Secondary | ICD-10-CM | POA: Diagnosis not present

## 2015-12-22 LAB — URINALYSIS COMPLETE WITH MICROSCOPIC (ARMC ONLY)
BILIRUBIN URINE: NEGATIVE
Bacteria, UA: NONE SEEN
GLUCOSE, UA: NEGATIVE mg/dL
Hgb urine dipstick: NEGATIVE
KETONES UR: NEGATIVE mg/dL
NITRITE: NEGATIVE
PH: 6 (ref 5.0–8.0)
Protein, ur: NEGATIVE mg/dL
Specific Gravity, Urine: 1.008 (ref 1.005–1.030)
Squamous Epithelial / LPF: NONE SEEN

## 2015-12-22 LAB — COMPREHENSIVE METABOLIC PANEL
ALK PHOS: 51 U/L (ref 38–126)
ALT: 15 U/L — ABNORMAL LOW (ref 17–63)
ANION GAP: 7 (ref 5–15)
AST: 16 U/L (ref 15–41)
Albumin: 3.5 g/dL (ref 3.5–5.0)
BUN: 21 mg/dL — ABNORMAL HIGH (ref 6–20)
CALCIUM: 9.1 mg/dL (ref 8.9–10.3)
CO2: 26 mmol/L (ref 22–32)
Chloride: 98 mmol/L — ABNORMAL LOW (ref 101–111)
Creatinine, Ser: 1.01 mg/dL (ref 0.61–1.24)
Glucose, Bld: 132 mg/dL — ABNORMAL HIGH (ref 65–99)
Potassium: 4.9 mmol/L (ref 3.5–5.1)
SODIUM: 131 mmol/L — AB (ref 135–145)
Total Bilirubin: 0.7 mg/dL (ref 0.3–1.2)
Total Protein: 7.4 g/dL (ref 6.5–8.1)

## 2015-12-22 LAB — CBC
HCT: 37 % — ABNORMAL LOW (ref 40.0–52.0)
HEMOGLOBIN: 12.2 g/dL — AB (ref 13.0–18.0)
MCH: 30.5 pg (ref 26.0–34.0)
MCHC: 33 g/dL (ref 32.0–36.0)
MCV: 92.4 fL (ref 80.0–100.0)
PLATELETS: 155 10*3/uL (ref 150–440)
RBC: 4.01 MIL/uL — ABNORMAL LOW (ref 4.40–5.90)
RDW: 14.7 % — ABNORMAL HIGH (ref 11.5–14.5)
WBC: 10.4 10*3/uL (ref 3.8–10.6)

## 2015-12-22 LAB — TROPONIN I

## 2015-12-22 NOTE — ED Provider Notes (Signed)
[facility] Emergency Department Provider Note  ____________________________________________    I have reviewed the triage vital signs and the nursing notes.   HISTORY  Chief Complaint Fall and Back Pain    HPI Nathan Cunningham is a 82 y.o. male who presents after mechanical fall today. He reports he lost his balance and fell backwards striking his head on the floor. He denies LOC. He does complain of mild thoracic back pain and mild lumbar pain. He denies neuro deficits. He reports he has a history of falls in the past. No chest pain, shortness of breath. No dizziness prior to the fall. No palpitations. No recent travel or pleurisy.     Past Medical History  Diagnosis Date  . Hypertension   . Diabetes mellitus   . Hyperlipidemia   . Macular degeneration     Right eye  . Constipation   . ETOH abuse     binge drinking  . Dizziness   . Diverticulosis   . Adenomatous polyp of colon 03/2003  . Hemorrhoids   . Sleep apnea     on CPAP    Patient Active Problem List   Diagnosis Date Noted  . 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 Abnormal CBC 10/21/2014  . Encounter for completion of form with patient 10/21/2014  . Chronic fatigue and malaise 07/22/2014  . Inguinal pain 03/19/2014  . Nocturia 03/19/2014  . 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 Thoracic degenerative disc disease 09/28/2010  . ATRIAL FLUTTER 02/27/2010  . APHTHOUS ULCERS 01/20/2010  . RISK OF FALLING 10/21/2009  . 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 PITYRIASIS [place] 01/21/2009  . WEIGHT GAIN 04/01/2008  . EDEMA 10/02/2007  . URINARY FREQUENCY, CHRONIC 10/02/2007  . GLOSSITIS 08/31/2007  . BEN LOC HYPERPLASIA PROS W/O UR OBST & OTH LUTS 08/31/2007  . HTN (hypertension) 04/24/2007  . Diabetes ([place]) 03/23/2007  . B12 deficiency 03/23/2007  . CONSTIPATION, SLOW TRANSIT 03/23/2007  . 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 STENOSIS, LUMBAR SPINE  03/23/2007  . Essential hypertension 02/01/2007    Past Surgical History  Procedure Laterality Date  . Appendectomy      age 62  . Cataract extraction      Left eye  . Inguinal hernia repair      age 77  . Cevical fusion    . Cardiac defibrillator placement    . Tonsillectomy      Current Outpatient Rx  Name  Route  Sig  Dispense  Refill  . amitriptyline (ELAVIL) 75 MG tablet      TAKE 1 TABLET BY MOUTH AT BEDTIME   90 tablet   0   . aspirin 325 MG tablet   Oral   Take 162 mg by mouth daily.         . Blood Glucose Monitoring Suppl (ONE TOUCH ULTRA SYSTEM KIT) W/DEVICE KIT   Does not apply   1 kit by Does not apply route once.   1 each   0     Use as instructed to test BS once daily E11.8   . Chromium Picolinate (RA CHROMIUM PICOLINATE) 400 MCG TABS   Oral   Take 1 tablet by mouth daily.           . cyanocobalamin (,VITAMIN B-12,) 1000 MCG/ML injection      Inject 1056mg every 60days IM (every other month)   1 mL   0   . docusate sodium (COLACE) 100 MG capsule  Oral   Take 100 mg by mouth 3 (three) times daily as needed.           . dorzolamide-timolol (COSOPT) 22.3-6.8 MG/ML ophthalmic solution      1 drop 2 (two) times daily.           . fish oil-omega-3 fatty acids 1000 MG capsule   Oral   Take by mouth. Take six daily         . glipiZIDE (GLUCOTROL XL) 5 MG 24 hr tablet   Oral   Take 1 tablet (5 mg total) by mouth daily.   90 tablet   1   . glucose blood (ONE TOUCH ULTRA TEST) test strip      USE AS DIRECTED TO TEST BLOOD SUGAR ONCE DAILY   100 each   1     Dx E11.8   . Lancets (ONETOUCH ULTRASOFT) lancets      Use as instructed   100 each   0     Use as instructed to test BS once daily E11.8   . Linaclotide (LINZESS) 145 MCG CAPS capsule   Oral   Take 1 capsule (145 mcg total) by mouth daily.   30 capsule      . Magnesium-Calcium-Folic Acid (MAGNEBIND 983) 400-200-1 MG TABS   Oral   Take 1 tablet by mouth daily.            . Melatonin 5 MG CAPS   Oral   Take 1 each by mouth daily.           . Multiple Vitamins-Minerals (EYE-VITES) TABS   Oral   Take 1 tablet by mouth 2 (two) times daily.           . Potassium Gluconate (K-99) 595 MG CAPS   Oral   Take 1 each by mouth daily.           . Saxagliptin-Metformin (KOMBIGLYZE XR) 12-998 MG TB24   Oral   Take 1 each by mouth daily.   90 tablet   1   . sitaGLIPtin (JANUVIA) 100 MG tablet   Oral   Take 1 tablet (100 mg total) by mouth daily.   90 tablet   3   . tadalafil (CIALIS) 5 MG tablet   Oral   Take 1 tablet (5 mg total) by mouth daily as needed for erectile dysfunction.   10 tablet   0   . tamsulosin (FLOMAX) 0.4 MG CAPS capsule   Oral   Take 1 capsule (0.4 mg total) by mouth daily.   90 capsule   3   . telmisartan (MICARDIS) 80 MG tablet      TAKE 1 TABLET DAILY   90 tablet   3     Allergies Review of patient's allergies indicates no known allergies.  Family History  Problem Relation Age of Onset  . Heart disease Father   . Stroke Father 1    Social History Social History  Substance Use Topics  . Smoking status: Never Smoker   . Smokeless tobacco: Never Used  . Alcohol Use: 7.0 oz/week    14 drink(s) per week    Review of Systems  Constitutional: Negative for fever. Eyes: Negative for redness ENT: Negative for sore throat Cardiovascular: Negative for chest pain Respiratory: Negative for shortness of breath. Gastrointestinal: Negative for abdominal pain Genitourinary: Negative for dysuria. Musculoskeletal: As above Skin: Skin tear to left elbow, abrasion to right elbow Neurological: Negative for focal weakness Psychiatric: no anxiety  ____________________________________________   PHYSICAL EXAM:  VITAL SIGNS: ED Triage Vitals  Enc Vitals Group     BP 12/22/15 1213 131/75 mmHg     Pulse Rate 12/22/15 1213 87     Resp 12/22/15 1213 18     Temp 12/22/15 1213 97.8 F (36.6 C)      Temp Source 12/22/15 1213 Oral     SpO2 12/22/15 1213 88 %     Weight 12/22/15 1213 202 lb (91.627 kg)     Height 12/22/15 1213 5' 8"  (1.727 m)     Head Cir --      Peak Flow --      Pain Score 12/22/15 1215 3     Pain Loc --      Pain Edu? --      Excl. in [place]? --      Constitutional: Alert and oriented.No acute distress Eyes: Conjunctivae are normal. No erythema or injection ENT   Head: Normocephalic and atraumatic.   Mouth/Throat: Mucous membranes are moist. Cardiovascular: Normal rate, regular rhythm. Normal and symmetric distal pulses are present in the upper extremities.  Respiratory: Normal respiratory effort without tachypnea nor retractions. Breath sounds are clear and equal bilaterally.  Gastrointestinal: Soft and non-tender in all quadrants. No distention. There is no CVA tenderness. Genitourinary: deferred Musculoskeletal: Nontender with normal range of motion in all extremities. No lower extremity tenderness. No pain with axial load of both legs. Full range of motion. No pelvic tenderness palpation No vertebral tenderness to palpation Neurologic:  Normal speech and language. No gross focal neurologic deficits are appreciated. Skin:  Skin is warm, dry.  Small skin tear to the left elbow, mild abrasion to the right elbow Psychiatric: Mood and affect are normal. Patient exhibits appropriate insight and judgment.  ____________________________________________    LABS (pertinent positives/negatives)  Labs Reviewed  CBC - Abnormal; Notable for the following:    RBC 4.01 (*)    Hemoglobin 12.2 (*)    HCT 37.0 (*)    RDW 14.7 (*)    All other components within normal limits  COMPREHENSIVE METABOLIC PANEL - Abnormal; Notable for the following:    Sodium 131 (*)    Chloride 98 (*)    Glucose, Bld 132 (*)    BUN 21 (*)    ALT 15 (*)    All other components within normal limits  URINALYSIS COMPLETEWITH MICROSCOPIC ([facility] ONLY) - Abnormal; Notable for the following:     Color, Urine YELLOW (*)    APPearance CLEAR (*)    Leukocytes, UA TRACE (*)    All other components within normal limits  TROPONIN I    ____________________________________________   EKG  ED ECG REPORT I, Lavonia Drafts, the attending physician, personally viewed and interpreted this ECG.  Date: 12/22/2015 EKG Time: 12:14 PM Rate: 90 Rhythm: normal sinus rhythm QRS Axis: normal Intervals: normal ST/T Wave abnormalities: normal Conduction Disturbances: none    ____________________________________________    RADIOLOGY  X-rays without bony injury. CT head unremarkable  ____________________________________________   PROCEDURES  Procedure(s) performed: none  Critical Care performed: none  ____________________________________________   INITIAL IMPRESSION / ASSESSMENT AND PLAN / ED COURSE  Pertinent labs & imaging results that were available during my care of the patient were reviewed by me and considered in my medical decision making (see chart for details).  Patient presents after mechanical fall. We will check labs, EKG, x-rays of the pelvis, lumbar spine, thoracic spine and CT head.  Lab tests are unremarkable, imaging  studies are reassuring. Patient feels well and has no distress. Appropriate for discharge at this time  ____________________________________________   FINAL CLINICAL IMPRESSION(S) / ED DIAGNOSES  Final diagnoses:  Fall with injury  Skin tear of elbow without complication, left, initial encounter          Lavonia Drafts, MD 12/22/15 1528

## 2015-12-22 NOTE — Discharge Instructions (Signed)
Skin Tear Care  A skin tear is a wound in which the top layer of skin has peeled off. This is a common problem with aging because the skin becomes thinner and more fragile as a person gets older. In addition, some medicines, such as oral corticosteroids, can lead to skin thinning if taken for long periods of time.   A skin tear is often repaired with tape or skin adhesive strips. This keeps the skin that has been peeled off in contact with the healthier skin beneath. Depending on the location of the wound, a bandage (dressing) may be applied over the tape or skin adhesive strips. Sometimes, during the healing process, the skin turns black and dies. Even when this happens, the torn skin acts as a good dressing until the skin underneath gets healthier and repairs itself.  HOME CARE INSTRUCTIONS    Change dressings once per day or as directed by your caregiver.   Gently clean the skin tear and the area around the tear using saline solution or mild soap and water.   Do not rub the injured skin dry. Let the area air dry.   Apply petroleum jelly or an antibiotic cream or ointment to keep the tear moist. This will help the wound heal. Do not allow a scab to form.   If the dressing sticks before the next dressing change, moisten it with warm soapy water and gently remove it.   Protect the injured skin until it has healed.   Only take over-the-counter or prescription medicines as directed by your caregiver.   Take showers or baths using warm soapy water. Apply a new dressing after the shower or bath.   Keep all follow-up appointments as directed by your caregiver.   SEEK IMMEDIATE MEDICAL CARE IF:    You have redness, swelling, or increasing pain in the skin tear.   You havepus coming from the skin tear.   You have chills.   You have a red streak that goes away from the skin tear.   You have a bad smell coming from the tear or dressing.   You have a fever or persistent symptoms for more than 2-3 days.   You  have a fever and your symptoms suddenly get worse.  MAKE SURE YOU:   Understand these instructions.   Will watch this condition.   Will get help right away if your child is not doing well or gets worse.     This information is not intended to replace advice given to you by your health care provider. Make sure you discuss any questions you have with your health care provider.     Document Released: 04/20/2001 Document Revised: 04/19/2012 Document Reviewed: 02/07/2012  Elsevier Interactive Patient Education 2016 [employer]

## 2015-12-22 NOTE — ED Notes (Signed)
Dr Archie Balboa has seen pt and talked with family.   Patients wife had to leave to get a different vehicle so she can take patient home. Pt is sitting up in chair in NAD waiting for his wife.

## 2015-12-22 NOTE — ED Notes (Addendum)
Pt to ED from home via [facility] after mechanical fall today resulting in falling backwards, hitting head, no LOC, does not take any blood thinners. Pt with c/c of thoracic back pain with movement. Pt with recent fall this Saturday resulting in same back pain/muscle spasms. Upon arrival, pt AAOx4, vitals stable, cervical collar removed by MD Kinner. NAD noted at this time.

## 2015-12-22 NOTE — ED Notes (Signed)
pts wife is here.  Told her that pt is disharged but we have been waiting for her to get here.   She expressed concern that patient is unable to walk because of weakness and she would like to speak with the physician.

## 2015-12-22 NOTE — ED Notes (Signed)
Pt at Xray at this time 

## 2015-12-22 NOTE — ED Notes (Signed)
Pt in room sleeping.   Per report from precious nurse pt is non-ambulatory without assistance.     He is ready to be discharged but his wife is playing golf and he has no other contacts.    Will continue to monitor while patient waits for a ride.

## 2015-12-22 NOTE — ED Notes (Signed)
Pt was able to ambulate in the room with assistance from staff and with a walker.   He did lose balance once but did not fall.  He made it to the commode and was able to sit down with assistance.   pts wife really wanted to speak with the physician before they are discharged.   Dr Archie Balboa notified.

## 2015-12-26 ENCOUNTER — Inpatient Hospital Stay: Payer: Medicare Other

## 2015-12-26 ENCOUNTER — Inpatient Hospital Stay: Payer: Medicare Other | Admitting: Internal Medicine

## 2015-12-30 ENCOUNTER — Telehealth: Payer: Self-pay | Admitting: Family Medicine

## 2015-12-30 NOTE — Telephone Encounter (Signed)
Spoke to pt and advised per Dr [name]. Pt states he is not going to the ED

## 2015-12-30 NOTE — Telephone Encounter (Signed)
[facility] Patient Name: Nathan Cunningham DOB: 03-Apr-1925 Initial Comment Caller states boyfriend has neuropathy in feet, has been falling and has been in the ER, he is confused and declining rapidly Nurse Assessment Nurse: Dimas Chyle, RN, Dellis Filbert Date/Time Eilene Ghazi Time): 12/30/2015 12:50:40 PM Confirm and document reason for call. If symptomatic, describe symptoms. You must click the next button to save text entered. ---Caller states boyfriend has neuropathy in feet, has been falling and has been in the ER, he is confused and declining rapidly. Has the patient traveled out of the country within the last 30 days? ---No Does the patient have any new or worsening symptoms? ---Yes Will a triage be completed? ---Yes Related visit to physician within the last 2 weeks? ---Yes Does the PT have any chronic conditions? (i.e. diabetes, asthma, etc.) ---Yes List chronic conditions. ---Neuropathy, HTN, macular degeneration, diabetes type 2 Is this a behavioral health or substance abuse call? ---No Guidelines Guideline Title Affirmed Question Affirmed Notes Confusion - Delirium [1] Longstanding confusion (e.g., dementia, stroke) AND [2] worsening Final Disposition User See Physician within 4 Hours (or PCP triage) Dimas Chyle, RN, Dellis Filbert Comments Caller refused recommendation that patient needed to be seen in the next 4 hours and insisted that appointment be scheduled with PCP for next available appointment. Referrals REFERRED TO PCP OFFICE Disagree/Comply: Comply

## 2015-12-30 NOTE — Telephone Encounter (Signed)
Pt has appt with Dr [name] on 01/01/16 at 11:45.

## 2015-12-30 NOTE — Telephone Encounter (Signed)
Please call pt- I strongly advise against him waiting until Thursday to be seen.  If he is that confused, he needs to go to the ER today.

## 2015-12-31 ENCOUNTER — Other Ambulatory Visit: Payer: Self-pay | Admitting: *Deleted

## 2015-12-31 MED ORDER — GLIPIZIDE ER 5 MG PO TB24: 5.0000 mg | ORAL_TABLET | Freq: Every day | ORAL | Status: DC

## 2016-01-01 ENCOUNTER — Encounter: Payer: Self-pay | Admitting: Family Medicine

## 2016-01-01 ENCOUNTER — Ambulatory Visit (INDEPENDENT_AMBULATORY_CARE_PROVIDER_SITE_OTHER): Payer: Medicare Other | Admitting: Family Medicine

## 2016-01-01 VITALS — BP 124/56 | HR 67 | Temp 97.9°F | Wt 204.2 lb

## 2016-01-01 DIAGNOSIS — R296 Repeated falls: Secondary | ICD-10-CM | POA: Insufficient documentation

## 2016-01-01 DIAGNOSIS — W19XXXD Unspecified fall, subsequent encounter: Secondary | ICD-10-CM | POA: Diagnosis not present

## 2016-01-01 DIAGNOSIS — R42 Dizziness and giddiness: Secondary | ICD-10-CM | POA: Diagnosis not present

## 2016-01-01 DIAGNOSIS — W19XXXA Unspecified fall, initial encounter: Secondary | ICD-10-CM | POA: Insufficient documentation

## 2016-01-01 NOTE — Assessment & Plan Note (Signed)
>  25 minutes spent in face to face time with patient, >50% spent in counselling or coordination of care Refer for Home Health PT/OT- evaluated and treat for gait instability. We did talk about ALF in the near future. He is aware but resistant to this possibility.

## 2016-01-01 NOTE — Patient Instructions (Signed)
Great to see you. Someone will call you with your home health referral.

## 2016-01-01 NOTE — Progress Notes (Signed)
Pre visit review using our clinic review tool, if applicable. No additional management support is needed unless otherwise documented below in the visit note. 

## 2016-01-01 NOTE — Progress Notes (Signed)
Subjective:   Patient ID: Nathan Cunningham, male    DOB: 1924-11-13, 82 y.o.   MRN: [id number]  Nathan Cunningham is a pleasant 82 y.o. year old male who presents to clinic today with his nephew, Shanon Brow, Hospitalization Follow-up  on 01/01/2016  HPI:  Was seen in ER on 12/22/15 after he slipped and fell on the floor- striking his head on the ground.  Notes and studies reviewed.  He denied any neurological symptoms at the time.  He did have some back pain.  Head CT, lumbar xray, thoracic xray and pelvic xray all negative for acute findings and was discharged home.  Unfortunately tripped and fell the day prior as well.  His wife is concerned because he seems to be having more balance and confusion issues.  Mr. Xiong denies this.   Dg Thoracic Spine 2 View  12/22/2015  CLINICAL DATA:  Fall backwards today. Upper and lower back pain. Bilateral hip pain. EXAM: THORACIC SPINE 2 VIEWS COMPARISON:  Multiple exams, including 09/17/2015 FINDINGS: Thoracic spondylosis. No compression fracture is identified. No pleural effusion noted. IMPRESSION: 1. Thoracic spondylosis. Electronically Signed   By: Van Clines M.D.   On: 12/22/2015 13:09   Dg Lumbar Spine 2-3 Views  12/22/2015  CLINICAL DATA:  Upper and lower back pain after fall. EXAM: LUMBAR SPINE - 2-3 VIEW COMPARISON:  09/28/2010 FINDINGS: Likely physiologic wedging at T11, T12, and L1. Prominent anterior interbody spurring. Considerable degenerative facet arthropathy. The degree of spondylosis reduces sensitivity for subtle fractures, but no fracture or acute subluxation is identified. Mild degenerative grade 1 retrolisthesis at L2-3 and L3-4, similar to the 2012 exam. IMPRESSION: 1. Considerable lumbar spondylosis without fracture or acute subluxation identified. Electronically Signed   By: Van Clines M.D.   On: 12/22/2015 13:15   Dg Pelvis 1-2 Views  12/22/2015  CLINICAL DATA:  Fall today.  Lower back pain and bilateral  hip pain. EXAM: PELVIS - 1-2 VIEW COMPARISON:  10/02/2014 FINDINGS: Extensive spurring of the acetabula and iliac bones as well as the trochanters. Mild degenerative loss of articular space in both hips. Scattered vascular calcifications and possible injection granulomata. No acute bony findings. IMPRESSION: 1. No acute bony findings.  Stable prominent spurring in the pelvis. Electronically Signed   By: Van Clines M.D.   On: 12/22/2015 13:16   Ct Head Wo Contrast  12/22/2015  CLINICAL DATA:  Pain following fall EXAM: CT HEAD WITHOUT CONTRAST TECHNIQUE: Contiguous axial images were obtained from the base of the skull through the vertex without intravenous contrast. COMPARISON:  December 01, 2011 FINDINGS: Mild diffuse atrophy is stable. There is no intracranial mass, hemorrhage, extra-axial fluid collection, or midline shift. Decreased attenuation is noted in the left medial subthalamic region consistent with a prior infarct in this area, stable. There is slight small vessel disease in the centra semiovale bilaterally. No new gray-white compartment lesion. No acute infarct evident. The bony calvarium appears intact. The mastoid air cells are clear. There is opacification of the visualized right maxillary antrum as well as most of the ethmoid air cells on the right as well as essentially the entire right frontal sinus. IMPRESSION: Atrophy with rather minimal periventricular small vessel disease. Prior medial left subthalamic infarct, stable. No acute infarct evident. No hemorrhage, mass, or extra-axial fluid collection. Extensive paranasal sinus disease. Electronically Signed   By: Lowella Grip III M.D.   On: 12/22/2015 12:48   Current Outpatient Prescriptions on File Prior to Visit  Medication Sig Dispense  Refill  . amitriptyline (ELAVIL) 75 MG tablet TAKE 1 TABLET BY MOUTH AT BEDTIME 90 tablet 0  . aspirin 325 MG tablet Take 162 mg by mouth daily.    . Blood Glucose Monitoring Suppl (ONE TOUCH  ULTRA SYSTEM KIT) W/DEVICE KIT 1 kit by Does not apply route once. 1 each 0  . Chromium Picolinate (RA CHROMIUM PICOLINATE) 400 MCG TABS Take 1 tablet by mouth daily.      . cyanocobalamin (,VITAMIN B-12,) 1000 MCG/ML injection Inject 1080mg every 60days IM (every other month) 1 mL 0  . docusate sodium (COLACE) 100 MG capsule Take 100 mg by mouth 3 (three) times daily as needed.      . dorzolamide-timolol (COSOPT) 22.3-6.8 MG/ML ophthalmic solution 1 drop 2 (two) times daily.      . fish oil-omega-3 fatty acids 1000 MG capsule Take by mouth. Take six daily    . glipiZIDE (GLUCOTROL XL) 5 MG 24 hr tablet Take 1 tablet (5 mg total) by mouth daily. 90 tablet 1  . glucose blood (ONE TOUCH ULTRA TEST) test strip USE AS DIRECTED TO TEST BLOOD SUGAR ONCE DAILY 100 each 1  . Lancets (ONETOUCH ULTRASOFT) lancets Use as instructed 100 each 0  . Linaclotide (LINZESS) 145 MCG CAPS capsule Take 1 capsule (145 mcg total) by mouth daily. 30 capsule   . Magnesium-Calcium-Folic Acid (MAGNEBIND 4852 400-200-1 MG TABS Take 1 tablet by mouth daily.      . Melatonin 5 MG CAPS Take 1 each by mouth daily.      . Multiple Vitamins-Minerals (EYE-VITES) TABS Take 1 tablet by mouth 2 (two) times daily.      . Potassium Gluconate (K-99) 595 MG CAPS Take 1 each by mouth daily.      . Saxagliptin-Metformin (KOMBIGLYZE XR) 12-998 MG TB24 Take 1 each by mouth daily. 90 tablet 1  . sitaGLIPtin (JANUVIA) 100 MG tablet Take 1 tablet (100 mg total) by mouth daily. 90 tablet 3  . tadalafil (CIALIS) 5 MG tablet Take 1 tablet (5 mg total) by mouth daily as needed for erectile dysfunction. 10 tablet 0  . tamsulosin (FLOMAX) 0.4 MG CAPS capsule Take 1 capsule (0.4 mg total) by mouth daily. 90 capsule 3  . telmisartan (MICARDIS) 80 MG tablet TAKE 1 TABLET DAILY 90 tablet 3   No current facility-administered medications on file prior to visit.    No Known Allergies  Past Medical History  Diagnosis Date  . Hypertension   .  Diabetes mellitus   . Hyperlipidemia   . Macular degeneration     Right eye  . Constipation   . ETOH abuse     binge drinking  . Dizziness   . Diverticulosis   . Adenomatous polyp of colon 03/2003  . Hemorrhoids   . Sleep apnea     on CPAP    Past Surgical History  Procedure Laterality Date  . Appendectomy      age 82 . Cataract extraction      Left eye  . Inguinal hernia repair      age 82 . Cevical fusion    . Cardiac defibrillator placement    . Tonsillectomy      Family History  Problem Relation Age of Onset  . Heart disease Father   . Stroke Father 716   Social History   Social History  . Marital Status: Unknown    Spouse Name: N/A  . Number of Children: 2  . Years of  Education: N/A   Occupational History  . retired    Social History Main Topics  . Smoking status: Never Smoker   . Smokeless tobacco: Never Used  . Alcohol Use: 7.0 oz/week    14 drink(s) per week  . Drug Use: No  . Sexual Activity: No   Other Topics Concern  . Not on file   Social History Narrative   Retired from [employer]   Goes to [place] sneakers 3 times a week.   Has a girlfriend.   Does not have a living will yet.   Desires CPR, does not prolonged life support if futile.         The PMH, PSH, Social History, Family History, Medications, and allergies have been reviewed in [facility], and have been updated if relevant.   Review of Systems  Constitutional: Negative.   Respiratory: Negative.   Cardiovascular: Negative.   Musculoskeletal: Negative.   Skin: Negative.   Neurological: Negative.   Psychiatric/Behavioral: Negative.   All other systems reviewed and are negative.      Objective:    BP 124/56 mmHg  Pulse 67  Temp(Src) 97.9 F (36.6 C) (Oral)  Wt 204 lb 4 oz (92.647 kg)  SpO2 89%   Physical Exam  Constitutional: He is oriented to person, place, and time. He appears well-developed and well-nourished. No distress.  HENT:  Head: Normocephalic.  Eyes:  Conjunctivae are normal.  Cardiovascular: Normal rate.   Pulmonary/Chest: Effort normal.  Neurological: He is alert and oriented to person, place, and time. No cranial nerve deficit.  Skin: Skin is warm and dry. He is not diaphoretic.  Psychiatric: He has a normal mood and affect. His behavior is normal. Judgment and thought content normal.  Nursing note and vitals reviewed.         Assessment & Plan:   Fall, subsequent encounter - Plan: Ambulatory referral to Home Health  Dizziness and giddiness  Frequent falls - Plan: Ambulatory referral to [facility] No Follow-up on file.

## 2016-01-04 DIAGNOSIS — Z7984 Long term (current) use of oral hypoglycemic drugs: Secondary | ICD-10-CM | POA: Diagnosis not present

## 2016-01-04 DIAGNOSIS — M4806 Spinal stenosis, lumbar region: Secondary | ICD-10-CM | POA: Diagnosis not present

## 2016-01-04 DIAGNOSIS — Z7982 Long term (current) use of aspirin: Secondary | ICD-10-CM | POA: Diagnosis not present

## 2016-01-04 DIAGNOSIS — I10 Essential (primary) hypertension: Secondary | ICD-10-CM | POA: Diagnosis not present

## 2016-01-04 DIAGNOSIS — R296 Repeated falls: Secondary | ICD-10-CM | POA: Diagnosis not present

## 2016-01-04 DIAGNOSIS — E1142 Type 2 diabetes mellitus with diabetic polyneuropathy: Secondary | ICD-10-CM | POA: Diagnosis not present

## 2016-01-04 DIAGNOSIS — Z95810 Presence of automatic (implantable) cardiac defibrillator: Secondary | ICD-10-CM | POA: Diagnosis not present

## 2016-01-04 DIAGNOSIS — R2689 Other abnormalities of gait and mobility: Secondary | ICD-10-CM | POA: Diagnosis not present

## 2016-01-04 DIAGNOSIS — K5790 Diverticulosis of intestine, part unspecified, without perforation or abscess without bleeding: Secondary | ICD-10-CM | POA: Diagnosis not present

## 2016-01-16 ENCOUNTER — Inpatient Hospital Stay: Payer: Medicare Other | Attending: Internal Medicine

## 2016-01-16 ENCOUNTER — Inpatient Hospital Stay (HOSPITAL_BASED_OUTPATIENT_CLINIC_OR_DEPARTMENT_OTHER): Payer: Medicare Other | Admitting: Internal Medicine

## 2016-01-16 VITALS — BP 116/71 | HR 91 | Temp 97.7°F | Resp 18 | Wt 202.4 lb

## 2016-01-16 DIAGNOSIS — C9310 Chronic myelomonocytic leukemia not having achieved remission: Secondary | ICD-10-CM | POA: Insufficient documentation

## 2016-01-16 DIAGNOSIS — Z7982 Long term (current) use of aspirin: Secondary | ICD-10-CM | POA: Insufficient documentation

## 2016-01-16 DIAGNOSIS — D469 Myelodysplastic syndrome, unspecified: Secondary | ICD-10-CM | POA: Insufficient documentation

## 2016-01-16 DIAGNOSIS — D126 Benign neoplasm of colon, unspecified: Secondary | ICD-10-CM | POA: Insufficient documentation

## 2016-01-16 DIAGNOSIS — G4730 Sleep apnea, unspecified: Secondary | ICD-10-CM | POA: Insufficient documentation

## 2016-01-16 DIAGNOSIS — D472 Monoclonal gammopathy: Secondary | ICD-10-CM

## 2016-01-16 DIAGNOSIS — Z79899 Other long term (current) drug therapy: Secondary | ICD-10-CM | POA: Insufficient documentation

## 2016-01-16 DIAGNOSIS — C9250 Acute myelomonocytic leukemia, not having achieved remission: Secondary | ICD-10-CM

## 2016-01-16 DIAGNOSIS — R42 Dizziness and giddiness: Secondary | ICD-10-CM | POA: Insufficient documentation

## 2016-01-16 DIAGNOSIS — K5790 Diverticulosis of intestine, part unspecified, without perforation or abscess without bleeding: Secondary | ICD-10-CM | POA: Insufficient documentation

## 2016-01-16 DIAGNOSIS — E785 Hyperlipidemia, unspecified: Secondary | ICD-10-CM

## 2016-01-16 DIAGNOSIS — F1010 Alcohol abuse, uncomplicated: Secondary | ICD-10-CM

## 2016-01-16 DIAGNOSIS — I10 Essential (primary) hypertension: Secondary | ICD-10-CM | POA: Diagnosis not present

## 2016-01-16 DIAGNOSIS — K5900 Constipation, unspecified: Secondary | ICD-10-CM | POA: Diagnosis not present

## 2016-01-16 DIAGNOSIS — M7989 Other specified soft tissue disorders: Secondary | ICD-10-CM | POA: Diagnosis not present

## 2016-01-16 LAB — CBC WITH DIFFERENTIAL/PLATELET
BASOS ABS: 0 10*3/uL (ref 0–0.1)
BASOS PCT: 0 %
EOS ABS: 0.1 10*3/uL (ref 0–0.7)
EOS PCT: 2 %
HCT: 34.3 % — ABNORMAL LOW (ref 40.0–52.0)
Hemoglobin: 11.6 g/dL — ABNORMAL LOW (ref 13.0–18.0)
Lymphocytes Relative: 27 %
Lymphs Abs: 1.7 10*3/uL (ref 1.0–3.6)
MCH: 30.8 pg (ref 26.0–34.0)
MCHC: 33.8 g/dL (ref 32.0–36.0)
MCV: 91 fL (ref 80.0–100.0)
MONO ABS: 1.8 10*3/uL — AB (ref 0.2–1.0)
MONOS PCT: 28 %
NEUTROS ABS: 2.8 10*3/uL (ref 1.4–6.5)
Neutrophils Relative %: 43 %
PLATELETS: 194 10*3/uL (ref 150–440)
RBC: 3.77 MIL/uL — ABNORMAL LOW (ref 4.40–5.90)
RDW: 15 % — AB (ref 11.5–14.5)
WBC: 6.4 10*3/uL (ref 3.8–10.6)

## 2016-01-16 LAB — COMPREHENSIVE METABOLIC PANEL
ALBUMIN: 3.4 g/dL — AB (ref 3.5–5.0)
ALT: 13 U/L — ABNORMAL LOW (ref 17–63)
ANION GAP: 4 — AB (ref 5–15)
AST: 14 U/L — AB (ref 15–41)
Alkaline Phosphatase: 63 U/L (ref 38–126)
BILIRUBIN TOTAL: 0.7 mg/dL (ref 0.3–1.2)
BUN: 19 mg/dL (ref 6–20)
CHLORIDE: 98 mmol/L — AB (ref 101–111)
CO2: 32 mmol/L (ref 22–32)
Calcium: 8.9 mg/dL (ref 8.9–10.3)
Creatinine, Ser: 0.99 mg/dL (ref 0.61–1.24)
GFR calc Af Amer: >60 mL/min (ref >60)
GFR calc non Af Amer: >60 mL/min (ref >60)
GLUCOSE: 185 mg/dL — AB (ref 65–99)
POTASSIUM: 4.4 mmol/L (ref 3.5–5.1)
Sodium: 134 mmol/L — ABNORMAL LOW (ref 135–145)
TOTAL PROTEIN: 7.9 g/dL (ref 6.5–8.1)

## 2016-01-16 NOTE — Progress Notes (Signed)
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 He is accompanied by his nephew.  He has intermittent swelling in the legs. He denies any unusual night sweats or weight loss or fevers. No lumps or bumps. Denies any new onset of back pain.  REVIEW OF SYSTEMS:  A complete 10 point review of system is done which is negative except mentioned above/history of present illness.   PAST MEDICAL HISTORY :  Past Medical History  Diagnosis Date  . Hypertension   . Diabetes mellitus   . Hyperlipidemia   . Macular degeneration     Right eye  . Constipation   . ETOH abuse     binge drinking  . Dizziness   . Diverticulosis   . Adenomatous polyp of colon 03/2003  . Hemorrhoids   . Sleep apnea     on CPAP    PAST SURGICAL HISTORY :   Past Surgical History  Procedure Laterality Date  . Appendectomy      age 78  . Cataract extraction      Left eye  . Inguinal hernia repair      age 45  . Cevical fusion    . Cardiac defibrillator placement    . Tonsillectomy      FAMILY HISTORY :   Family History  Problem Relation Age of Onset  . Heart disease Father   . Stroke Father 63    SOCIAL HISTORY:   Social History  Substance Use Topics  .  Smoking status: Never Smoker   . Smokeless tobacco: Never Used  . Alcohol Use: 7.0 oz/week    14 drink(s) per week    ALLERGIES:  has No Known Allergies.  MEDICATIONS:  Current Outpatient Prescriptions  Medication Sig Dispense Refill  . amitriptyline (ELAVIL) 75 MG tablet TAKE 1 TABLET BY MOUTH AT BEDTIME 90 tablet 0  . aspirin 325 MG tablet Take 162 mg by mouth daily.    . Blood Glucose Monitoring Suppl (ONE TOUCH ULTRA SYSTEM KIT) W/DEVICE KIT 1 kit by Does not apply route once. 1 each 0  . Chromium Picolinate (RA CHROMIUM PICOLINATE) 400 MCG TABS Take 1 tablet by mouth daily.      Marland Kitchen docusate sodium (COLACE) 100 MG capsule Take 100 mg by mouth 3 (three) times daily as needed.      . dorzolamide-timolol (COSOPT) 22.3-6.8 MG/ML ophthalmic solution 1 drop 2 (two) times daily.      . fish oil-omega-3 fatty acids 1000 MG capsule Take by mouth. Take six daily    . glipiZIDE (GLUCOTROL XL) 5 MG 24 hr tablet Take 1 tablet (5 mg total) by mouth daily. 90 tablet 1  . glucose blood (ONE TOUCH ULTRA TEST) test strip USE AS DIRECTED TO TEST BLOOD SUGAR ONCE DAILY 100 each 1  . Lancets (ONETOUCH ULTRASOFT)  lancets Use as instructed 100 each 0  . Magnesium-Calcium-Folic Acid (MAGNEBIND 676) 400-200-1 MG TABS Take 1 tablet by mouth daily.      . Melatonin 5 MG CAPS Take 1 each by mouth daily.      . Multiple Vitamins-Minerals (EYE-VITES) TABS Take 1 tablet by mouth 2 (two) times daily.      . Potassium Gluconate (K-99) 595 MG CAPS Take 1 each by mouth daily.      . Saxagliptin-Metformin (KOMBIGLYZE XR) 12-998 MG TB24 Take 1 each by mouth daily. 90 tablet 1  . sitaGLIPtin (JANUVIA) 100 MG tablet Take 1 tablet (100 mg total) by mouth daily. 90 tablet 3  . telmisartan (MICARDIS) 80 MG tablet TAKE 1 TABLET DAILY 90 tablet 3  . cyanocobalamin (,VITAMIN B-12,) 1000 MCG/ML injection Inject 1053mg every 60days IM (every other month) (Patient not taking: Reported on 01/16/2016) 1 mL 0   No current  facility-administered medications for this visit.    PHYSICAL EXAMINATION: ECOG PERFORMANCE STATUS: 0 - Asymptomatic  BP 116/71 mmHg  Pulse 91  Temp(Src) 97.7 F (36.5 C) (Tympanic)  Resp 18  Wt 202 lb 6.1 oz (91.8 kg)  Filed Weights   01/16/16 0953  Weight: 202 lb 6.1 oz (91.8 kg)    GENERAL: Elderly Caucasian gentleman. Well-nourished well-developed; Alert, no distress and comfortable.    He walks with a walker. He is accompanied by his nephew. EYES: no pallor or icterus OROPHARYNX: no thrush or ulceration; good dentition  NECK: supple, no masses felt LYMPH:  no palpable lymphadenopathy in the cervical, axillary or inguinal regions LUNGS: clear to auscultation and  No wheeze or crackles HEART/CVS: regular rate & rhythm and no murmurs; 1+ bilateral extremity edema ABDOMEN:abdomen soft, non-tender and normal bowel sounds Musculoskeletal:no cyanosis of digits and no clubbing  PSYCH: alert & oriented x 3 with fluent speech NEURO: no focal motor/sensory deficits SKIN:  no rashes or significant lesions  LABORATORY DATA:  I have reviewed the data as listed    Component Value Date/Time   NA 134* 01/16/2016 0924   NA 134* 12/01/2011 1159   K 4.4 01/16/2016 0924   K 4.7 12/01/2011 1159   CL 98* 01/16/2016 0924   CL 99 12/01/2011 1159   CO2 32 01/16/2016 0924   CO2 29 12/01/2011 1159   GLUCOSE 185* 01/16/2016 0924   GLUCOSE 167* 12/01/2011 1159   BUN 19 01/16/2016 0924   BUN 12 12/01/2011 1159   CREATININE 0.99 01/16/2016 0924   CREATININE 0.70 12/01/2011 1159   CALCIUM 8.9 01/16/2016 0924   CALCIUM 8.5 12/01/2011 1159   PROT 7.9 01/16/2016 0924   PROT 7.0 12/01/2011 1159   ALBUMIN 3.4* 01/16/2016 0924   ALBUMIN 3.0* 12/01/2011 1159   AST 14* 01/16/2016 0924   AST 16 12/01/2011 1159   ALT 13* 01/16/2016 0924   ALT 19 12/01/2011 1159   ALKPHOS 63 01/16/2016 0924   ALKPHOS 64 12/01/2011 1159   BILITOT 0.7 01/16/2016 0924   BILITOT 0.8 12/01/2011 1159   GFRNONAA >60  01/16/2016 0924   GFRNONAA >60 12/01/2011 1159   GFRAA >60 01/16/2016 0924   GFRAA >60 12/01/2011 1159    No results found for: SPEP, UPEP  Lab Results  Component Value Date   WBC 6.4 01/16/2016   NEUTROABS 2.8 01/16/2016   HGB 11.6* 01/16/2016   HCT 34.3* 01/16/2016   MCV 91.0 01/16/2016   PLT 194 01/16/2016      Chemistry      Component Value  Date/Time   NA 134* 01/16/2016 0924   NA 134* 12/01/2011 1159   K 4.4 01/16/2016 0924   K 4.7 12/01/2011 1159   CL 98* 01/16/2016 0924   CL 99 12/01/2011 1159   CO2 32 01/16/2016 0924   CO2 29 12/01/2011 1159   BUN 19 01/16/2016 0924   BUN 12 12/01/2011 1159   CREATININE 0.99 01/16/2016 0924   CREATININE 0.70 12/01/2011 1159      Component Value Date/Time   CALCIUM 8.9 01/16/2016 0924   CALCIUM 8.5 12/01/2011 1159   ALKPHOS 63 01/16/2016 0924   ALKPHOS 64 12/01/2011 1159   AST 14* 01/16/2016 0924   AST 16 12/01/2011 1159   ALT 13* 01/16/2016 0924   ALT 19 12/01/2011 1159   BILITOT 0.7 01/16/2016 0924   BILITOT 0.8 12/01/2011 1159       ASSESSMENT & PLAN:   # CMML- blood counts fairly within normal limits except for mildly low hemoglobin of 11.6; Normal white count and normal platelets. Patient continues to be on surveillance.  # MGUS- most recent Nov  2016 M spike 0.8 g/dL. Awaiting labs from today. We'll repeat SPEP in 6 months.  # Given the patient's age and relative stability of the above to diagnosis [CMML; MGUS]- I doubt if this will be clinically be significant in the near future. Given the patient's fairly decent performance status recommend- Follow-up with me in 6 months/labs. I reviewed the labs/plan above plan with the patient and his nephew in detail.    Cammie Sickle, MD 01/16/2016 10:12 AM

## 2016-01-19 LAB — IMMUNOFIXATION ELECTROPHORESIS
IGG (IMMUNOGLOBIN G), SERUM: 2397 mg/dL — AB (ref 700–1600)
IgA: 336 mg/dL (ref 61–437)
IgM, Serum: 114 mg/dL (ref 15–143)
Total Protein ELP: 7.2 g/dL (ref 6.0–8.5)

## 2016-01-19 LAB — KAPPA/LAMBDA LIGHT CHAINS
KAPPA FREE LGHT CHN: 48.5 mg/L — AB (ref 3.3–19.4)
Kappa, lambda light chain ratio: 1.13 (ref 0.26–1.65)
LAMDA FREE LIGHT CHAINS: 43 mg/L — AB (ref 5.7–26.3)

## 2016-01-20 LAB — MULTIPLE MYELOMA PANEL, SERUM
ALBUMIN SERPL ELPH-MCNC: 3.4 g/dL (ref 2.9–4.4)
ALPHA 1: 0.3 g/dL (ref 0.0–0.4)
ALPHA2 GLOB SERPL ELPH-MCNC: 0.6 g/dL (ref 0.4–1.0)
Albumin/Glob SerPl: 0.9 (ref 0.7–1.7)
B-GLOBULIN SERPL ELPH-MCNC: 0.9 g/dL (ref 0.7–1.3)
GAMMA GLOB SERPL ELPH-MCNC: 2.2 g/dL — AB (ref 0.4–1.8)
GLOBULIN, TOTAL: 3.9 g/dL (ref 2.2–3.9)
IGG (IMMUNOGLOBIN G), SERUM: 2222 mg/dL — AB (ref 700–1600)
IgA: 332 mg/dL (ref 61–437)
IgM, Serum: 115 mg/dL (ref 15–143)
M PROTEIN SERPL ELPH-MCNC: 1 g/dL — AB
Total Protein ELP: 7.3 g/dL (ref 6.0–8.5)

## 2016-01-22 DIAGNOSIS — R2689 Other abnormalities of gait and mobility: Secondary | ICD-10-CM | POA: Diagnosis not present

## 2016-01-22 DIAGNOSIS — R296 Repeated falls: Secondary | ICD-10-CM | POA: Diagnosis not present

## 2016-01-22 DIAGNOSIS — E1142 Type 2 diabetes mellitus with diabetic polyneuropathy: Secondary | ICD-10-CM | POA: Diagnosis not present

## 2016-01-22 DIAGNOSIS — M4806 Spinal stenosis, lumbar region: Secondary | ICD-10-CM | POA: Diagnosis not present

## 2016-01-22 MED ORDER — GLIPIZIDE ER 5 MG PO TB24: 5.0000 mg | ORAL_TABLET | Freq: Every day | ORAL | Status: DC

## 2016-01-22 NOTE — Addendum Note (Signed)
Addended by: Helene Shoe on: 01/22/2016 12:54 PM   Modules accepted: Orders

## 2016-01-22 NOTE — Telephone Encounter (Signed)
Pt called to find out why glipizide was not refilled to [facility] mail [facility]; I apologized to pt; refill had been sent to [address]. Pt does not want local wants sent to [facility] mail order advised pt done.

## 2016-01-27 ENCOUNTER — Telehealth: Payer: Self-pay

## 2016-01-27 NOTE — Telephone Encounter (Signed)
Spoke to [place] and provided verbal orders as requested.

## 2016-01-27 NOTE — Telephone Encounter (Signed)
Ok to give verbal orders as requested. 

## 2016-01-27 NOTE — Telephone Encounter (Signed)
Nathan Cunningham PT with Home Health left v/m requesting verbal orders to extend home health PT 1 x a week for 2 weeks.

## 2016-01-30 ENCOUNTER — Telehealth: Payer: Self-pay

## 2016-01-30 NOTE — Telephone Encounter (Signed)
Spoke to [facility] and advised per Dr [name]. States she will enter order for nurse to come to pts home

## 2016-01-30 NOTE — Telephone Encounter (Signed)
If it would be easier for pt if nurse came to home and he or she can come out rather quickly, that would be ok.  If not, please have come to the office to be seen.

## 2016-01-30 NOTE — Telephone Encounter (Signed)
Mary PT with [facility] left v/m; Stanton Kidney went to see pt today; pt has not been resting well for one month. On 01/27/16 pt was extremely cold and shaky and had difficulty talking; that has resolved. Pt complains of pain on his buttocks; pts wife saw the area and appears has spot in between buttocks that may be infected; not sure how long been there. O 2 sat was 88 % today and usually 92 %; walk is unstable also. Stanton Kidney wants to know if pt should schedule appt at [facility] or do you want a nurse to come out from [facility] to do accessment. Mary request cb.

## 2016-02-02 DIAGNOSIS — E1142 Type 2 diabetes mellitus with diabetic polyneuropathy: Secondary | ICD-10-CM | POA: Diagnosis not present

## 2016-02-02 DIAGNOSIS — B351 Tinea unguium: Secondary | ICD-10-CM | POA: Diagnosis not present

## 2016-02-03 ENCOUNTER — Encounter: Payer: Self-pay | Admitting: Family Medicine

## 2016-02-03 ENCOUNTER — Ambulatory Visit (INDEPENDENT_AMBULATORY_CARE_PROVIDER_SITE_OTHER): Payer: Medicare Other | Admitting: Family Medicine

## 2016-02-03 VITALS — BP 116/60 | HR 75 | Temp 97.7°F | Wt 201.8 lb

## 2016-02-03 DIAGNOSIS — L89159 Pressure ulcer of sacral region, unspecified stage: Secondary | ICD-10-CM | POA: Insufficient documentation

## 2016-02-03 DIAGNOSIS — L89151 Pressure ulcer of sacral region, stage 1: Secondary | ICD-10-CM | POA: Diagnosis not present

## 2016-02-03 NOTE — Patient Instructions (Signed)
Good to see you. Please come see me in 2 days.  Try placing a pillow under one side of your buttocks/hip and alternate this pillow through the day/night.

## 2016-02-03 NOTE — Progress Notes (Signed)
Pre visit review using our clinic review tool, if applicable. No additional management support is needed unless otherwise documented below in the visit note. 

## 2016-02-03 NOTE — Progress Notes (Signed)
Subjective:   Patient ID: Nathan Cunningham, male    DOB: 1925/04/24, 82 y.o.   MRN: [id number]  Nathan Cunningham is a pleasant 82 y.o. year old male who presents to clinic today with Wound Infection and Chills  on 02/03/2016  HPI:  Sacral decubitus ulcer-  Pt has been complaining of pain on his buttocks. His wife was concerned that it is possibly infected.  No fevers.  Did have chills the other night- he thinks his blood sugar was low.  Has not had any symptoms like that since.   Current Outpatient Prescriptions on File Prior to Visit  Medication Sig Dispense Refill  . amitriptyline (ELAVIL) 75 MG tablet TAKE 1 TABLET BY MOUTH AT BEDTIME 90 tablet 0  . aspirin 325 MG tablet Take 162 mg by mouth daily.    . Blood Glucose Monitoring Suppl (ONE TOUCH ULTRA SYSTEM KIT) W/DEVICE KIT 1 kit by Does not apply route once. 1 each 0  . Chromium Picolinate (RA CHROMIUM PICOLINATE) 400 MCG TABS Take 1 tablet by mouth daily.      Marland Kitchen docusate sodium (COLACE) 100 MG capsule Take 100 mg by mouth 3 (three) times daily as needed.      . dorzolamide-timolol (COSOPT) 22.3-6.8 MG/ML ophthalmic solution 1 drop 2 (two) times daily.      . fish oil-omega-3 fatty acids 1000 MG capsule Take by mouth. Take six daily    . glipiZIDE (GLUCOTROL XL) 5 MG 24 hr tablet Take 1 tablet (5 mg total) by mouth daily. 90 tablet 1  . glucose blood (ONE TOUCH ULTRA TEST) test strip USE AS DIRECTED TO TEST BLOOD SUGAR ONCE DAILY 100 each 1  . Lancets (ONETOUCH ULTRASOFT) lancets Use as instructed 100 each 0  . Magnesium-Calcium-Folic Acid (MAGNEBIND 637) 400-200-1 MG TABS Take 1 tablet by mouth daily.      . Melatonin 5 MG CAPS Take 1 each by mouth daily.      . Multiple Vitamins-Minerals (EYE-VITES) TABS Take 1 tablet by mouth 2 (two) times daily.      . Potassium Gluconate (K-99) 595 MG CAPS Take 1 each by mouth daily.      . Saxagliptin-Metformin (KOMBIGLYZE XR) 12-998 MG TB24 Take 1 each by mouth daily. 90 tablet 1  .  sitaGLIPtin (JANUVIA) 100 MG tablet Take 1 tablet (100 mg total) by mouth daily. 90 tablet 3  . telmisartan (MICARDIS) 80 MG tablet TAKE 1 TABLET DAILY 90 tablet 3  . cyanocobalamin (,VITAMIN B-12,) 1000 MCG/ML injection Inject 1011mg every 60days IM (every other month) (Patient not taking: Reported on 02/03/2016) 1 mL 0   No current facility-administered medications on file prior to visit.    No Known Allergies  Past Medical History  Diagnosis Date  . Hypertension   . Diabetes mellitus   . Hyperlipidemia   . Macular degeneration     Right eye  . Constipation   . ETOH abuse     binge drinking  . Dizziness   . Diverticulosis   . Adenomatous polyp of colon 03/2003  . Hemorrhoids   . Sleep apnea     on CPAP    Past Surgical History  Procedure Laterality Date  . Appendectomy      age [age] . Cataract extraction      Left eye  . Inguinal hernia repair      age [age] . Cevical fusion    . Cardiac defibrillator placement    . Tonsillectomy  Family History  Problem Relation Age of Onset  . Heart disease Father   . Stroke Father 59    Social History   Social History  . Marital Status: Unknown    Spouse Name: N/A  . Number of Children: 2  . Years of Education: N/A   Occupational History  . retired    Social History Main Topics  . Smoking status: Never Smoker   . Smokeless tobacco: Never Used  . Alcohol Use: 7.0 oz/week    14 drink(s) per week  . Drug Use: No  . Sexual Activity: No   Other Topics Concern  . Not on file   Social History Narrative   Retired from [employer]   Goes to [employer] sneakers 3 times a week.   Has a girlfriend.   Does not have a living will yet.   Desires CPR, does not prolonged life support if futile.         The PMH, PSH, Social History, Family History, Medications, and allergies have been reviewed in [facility], and have been updated if relevant.  Review of Systems  Constitutional: Negative for fever.  Respiratory: Negative.     Cardiovascular: Negative.   Gastrointestinal: Negative.   Skin: Positive for wound.  Hematological: Negative.   Psychiatric/Behavioral: Negative.   All other systems reviewed and are negative.      Objective:    BP 116/60 mmHg  Pulse 75  Temp(Src) 97.7 F (36.5 C) (Oral)  Wt 201 lb 12 oz (91.513 kg)  SpO2 94%   Physical Exam  Constitutional: He appears well-developed and well-nourished. No distress.  HENT:  Head: Normocephalic.  Eyes: Conjunctivae are normal.  Pulmonary/Chest: Effort normal.  Skin: He is not diaphoretic.     Psychiatric: He has a normal mood and affect. His behavior is normal. Judgment and thought content normal.  Nursing note and vitals reviewed.         Assessment & Plan:   Sacral decubitus ulcer, unspecified pressure ulcer stage  Decubitus ulcer of sacral region, stage 1 No Follow-up on file.

## 2016-02-03 NOTE — Assessment & Plan Note (Signed)
New- hydrocolloid dressing applied to area. He will follow up with me in 2 days. The patient indicates understanding of these issues and agrees with the plan.

## 2016-02-05 ENCOUNTER — Encounter: Payer: Self-pay | Admitting: Family Medicine

## 2016-02-05 ENCOUNTER — Ambulatory Visit (INDEPENDENT_AMBULATORY_CARE_PROVIDER_SITE_OTHER): Payer: Medicare Other | Admitting: Family Medicine

## 2016-02-05 VITALS — BP 136/70 | HR 73 | Temp 98.1°F | Wt 201.2 lb

## 2016-02-05 DIAGNOSIS — L89151 Pressure ulcer of sacral region, stage 1: Secondary | ICD-10-CM

## 2016-02-05 MED ORDER — TRIAMCINOLONE 0.1 % CREAM:EUCERIN CREAM 1:1: 1.0000 "application " | TOPICAL_CREAM | Freq: Three times a day (TID) | CUTANEOUS | Status: DC | PRN

## 2016-02-05 NOTE — Progress Notes (Signed)
Subjective:   Patient ID: Nathan Cunningham, male    DOB: Aug 28, 1924, 82 y.o.   MRN: [id number]  Nathan Cunningham is a pleasant 82 y.o. year old male who presents to clinic today with Follow-up  on 02/05/2016  HPI:  I saw him two days ago for a painful area on his sacrum.  Has a very superficial sacral ulcer but in the folds of his buttocks.  Attempted to apply hydrocoloid dressing but it buncjed up and actually caused more discomfort.  No fevers or chills.   Current Outpatient Prescriptions on File Prior to Visit  Medication Sig Dispense Refill  . amitriptyline (ELAVIL) 75 MG tablet TAKE 1 TABLET BY MOUTH AT BEDTIME 90 tablet 0  . aspirin 325 MG tablet Take 162 mg by mouth daily.    . Blood Glucose Monitoring Suppl (ONE TOUCH ULTRA SYSTEM KIT) W/DEVICE KIT 1 kit by Does not apply route once. 1 each 0  . Chromium Picolinate (RA CHROMIUM PICOLINATE) 400 MCG TABS Take 1 tablet by mouth daily.      Marland Kitchen docusate sodium (COLACE) 100 MG capsule Take 100 mg by mouth 3 (three) times daily as needed.      . dorzolamide-timolol (COSOPT) 22.3-6.8 MG/ML ophthalmic solution 1 drop 2 (two) times daily.      . fish oil-omega-3 fatty acids 1000 MG capsule Take by mouth. Take six daily    . glipiZIDE (GLUCOTROL XL) 5 MG 24 hr tablet Take 1 tablet (5 mg total) by mouth daily. 90 tablet 1  . glucose blood (ONE TOUCH ULTRA TEST) test strip USE AS DIRECTED TO TEST BLOOD SUGAR ONCE DAILY 100 each 1  . Lancets (ONETOUCH ULTRASOFT) lancets Use as instructed 100 each 0  . Magnesium-Calcium-Folic Acid (MAGNEBIND 830) 400-200-1 MG TABS Take 1 tablet by mouth daily.      . Melatonin 5 MG CAPS Take 1 each by mouth daily.      . Multiple Vitamins-Minerals (EYE-VITES) TABS Take 1 tablet by mouth 2 (two) times daily.      . Potassium Gluconate (K-99) 595 MG CAPS Take 1 each by mouth daily.      . Saxagliptin-Metformin (KOMBIGLYZE XR) 12-998 MG TB24 Take 1 each by mouth daily. 90 tablet 1  . sitaGLIPtin (JANUVIA)  100 MG tablet Take 1 tablet (100 mg total) by mouth daily. 90 tablet 3  . telmisartan (MICARDIS) 80 MG tablet TAKE 1 TABLET DAILY 90 tablet 3  . cyanocobalamin (,VITAMIN B-12,) 1000 MCG/ML injection Inject 1025mg every 60days IM (every other month) (Patient not taking: Reported on 02/05/2016) 1 mL 0   No current facility-administered medications on file prior to visit.    No Known Allergies  Past Medical History  Diagnosis Date  . Hypertension   . Diabetes mellitus   . Hyperlipidemia   . Macular degeneration     Right eye  . Constipation   . ETOH abuse     binge drinking  . Dizziness   . Diverticulosis   . Adenomatous polyp of colon 03/2003  . Hemorrhoids   . Sleep apnea     on CPAP    Past Surgical History  Procedure Laterality Date  . Appendectomy      age 82 . Cataract extraction      Left eye  . Inguinal hernia repair      age 82 . Cevical fusion    . Cardiac defibrillator placement    . Tonsillectomy      Family  History  Problem Relation Age of Onset  . Heart disease Father   . Stroke Father 49    Social History   Social History  . Marital Status: Unknown    Spouse Name: N/A  . Number of Children: 2  . Years of Education: N/A   Occupational History  . retired    Social History Main Topics  . Smoking status: Never Smoker   . Smokeless tobacco: Never Used  . Alcohol Use: 7.0 oz/week    14 drink(s) per week  . Drug Use: No  . Sexual Activity: No   Other Topics Concern  . Not on file   Social History Narrative   Retired from [employer]   Goes to [place] sneakers 3 times a week.   Has a girlfriend.   Does not have a living will yet.   Desires CPR, does not prolonged life support if futile.         The PMH, PSH, Social History, Family History, Medications, and allergies have been reviewed in [facility], and have been updated if relevant.   Review of Systems  Constitutional: Negative.   Skin: Positive for wound.  All other systems reviewed  and are negative.      Objective:    BP 136/70 mmHg  Pulse 73  Temp(Src) 98.1 F (36.7 C) (Oral)  Wt 201 lb 4 oz (91.286 kg)  SpO2 94%   Physical Exam  Constitutional: He is oriented to person, place, and time. He appears well-developed.  HENT:  Head: Normocephalic and atraumatic.  Eyes: Conjunctivae are normal.  Cardiovascular: Normal rate.   Pulmonary/Chest: Effort normal.  Neurological: He is alert and oriented to person, place, and time. No cranial nerve deficit.  Skin:     Psychiatric: He has a normal mood and affect. His behavior is normal. Judgment and thought content normal.  Nursing note and vitals reviewed.         Assessment & Plan:   Sacral decubitus ulcer, stage I No Follow-up on file.

## 2016-02-05 NOTE — Progress Notes (Signed)
Pre visit review using our clinic review tool, if applicable. No additional management support is needed unless otherwise documented below in the visit note. 

## 2016-02-05 NOTE — Patient Instructions (Addendum)
Good to see you. Apply triamcinolone cream three times daily.  If still red or irritated, add neosporin twice daily as well.  Keep me updated.

## 2016-02-05 NOTE — Assessment & Plan Note (Signed)
Unchanged- hard to keep bandage in tact given location. Start triamcinolone-eucerin to area twice daily, can also use barrier cream/ointment. Follow up next week.

## 2016-02-12 ENCOUNTER — Telehealth: Payer: Self-pay

## 2016-02-12 NOTE — Telephone Encounter (Signed)
Nathan Cunningham PT with [facility] left v/m; pt has cancelled last 2 PT visits with home health due to pt saying med has "not kicked in yet". [facility] request verbal order for 1 x a week for 2 weeks.Please advise.

## 2016-02-13 NOTE — Telephone Encounter (Signed)
Lm on [facility] vm and provided verbal order as requested

## 2016-02-13 NOTE — Telephone Encounter (Signed)
Ok to give verbal order as requested. 

## 2016-02-19 ENCOUNTER — Telehealth: Payer: Self-pay | Admitting: Family Medicine

## 2016-02-19 ENCOUNTER — Encounter: Payer: Self-pay | Admitting: Internal Medicine

## 2016-02-19 ENCOUNTER — Ambulatory Visit (INDEPENDENT_AMBULATORY_CARE_PROVIDER_SITE_OTHER): Payer: Medicare Other | Admitting: Internal Medicine

## 2016-02-19 VITALS — BP 118/72 | HR 79 | Temp 98.7°F | Wt 201.5 lb

## 2016-02-19 DIAGNOSIS — K5641 Fecal impaction: Secondary | ICD-10-CM | POA: Diagnosis not present

## 2016-02-19 DIAGNOSIS — L89152 Pressure ulcer of sacral region, stage 2: Secondary | ICD-10-CM

## 2016-02-19 DIAGNOSIS — S31000A Unspecified open wound of lower back and pelvis without penetration into retroperitoneum, initial encounter: Secondary | ICD-10-CM

## 2016-02-19 NOTE — Progress Notes (Signed)
Pre visit review using our clinic review tool, if applicable. No additional management support is needed unless otherwise documented below in the visit note. 

## 2016-02-19 NOTE — Telephone Encounter (Signed)
Are is in a difficult location.  Urgent referral placed to wound center.

## 2016-02-19 NOTE — Patient Instructions (Signed)
Pressure Injury °A pressure injury, sometimes called a bedsore, is an injury to the skin and underlying tissue caused by pressure. Pressure on blood vessels causes decreased blood flow to the skin, which can eventually cause the skin tissue to die and break down into a wound. °Pressure injuries usually occur: °· Over bony parts of the body such as the tailbone, shoulders, elbows, hips, and heels. °· Under medical devices such as respiratory equipment, stockings, tubes, and splints. °Pressure injuries start as reddened areas on the skin and can lead to pain, muscle damage, and infection. Pressure injuries can vary in severity.  °CAUSES °Pressure injuries are caused by a lack of blood supply to an area of skin. They can occur from intense pressure over a short period of time or from less intense pressure over a long period of time. °RISK FACTORS °This condition is more likely to develop in people who: °· Are in the hospital or an extended care facility. °· Are bedridden or in a wheelchair. °· Have an injury or disease that keeps them from: °¨ Moving normally. °¨ Feeling pain or pressure. °· Have a condition that: °¨ Makes them sleepy or less alert. °¨ Causes poor blood flow. °· Need to wear a medical device. °· Have poor control of their bladder or bowel functions (incontinence). °· Have poor nutrition (malnutrition). °· Are of certain ethnicities. People of African American and Latino or Hispanic descent are at higher risk compared to other ethnic groups. °If you are at risk for pressure ulcers, your health care provider may recommend certain types of bedding to help prevent them. These may include foam or gel mattresses covered with one of the following: °· A sheepskin blanket. °· A pad that is filled with gel, air, water, or foam. °SYMPTOMS  °The main symptom is a blister or change in skin color that opens into a wound. Other symptoms include:  °· Red or dark areas of skin that do not turn white or pale when  pressed with a finger.   °· Pain, warmth, or change of skin texture.   °DIAGNOSIS °This condition is diagnosed with a medical history and physical exam. You may also have tests, including:  °· Blood tests to check for infection or signs of poor nutrition. °· Imaging studies to check for damage to the deep tissues under your skin. °· Blood flow studies. °Your pressure injury will be staged to determine its severity. Staging is an assessment of: °· The depth of the pressure injury. °· Which tissues are exposed because of the pressure injury. °· The causes of the pressure injury.  °TREATMENT °The main focus of treatment is to help your injury heal. This may be done by:  °· Relieving or redistributing pressure on your skin. This includes: °¨ Frequently changing your position. °¨ Eliminating or minimizing positions that caused the wound or that can make the wound worse. °¨ Using specific bed mattresses and chair cushions. °¨ Refitting, resizing, or replacing any medical devices, or padding the skin under them. °¨ Using creams or powders to prevent rubbing (friction) on the skin. °· Keeping your skin clean and dry. This may include using a skin cleanser or skin protectant as told by your health care provider. This may be a lotion, ointment, or spray. °· Cleaning your injury and removing any dead tissue from the wound (debridement). °· Placing a bandage (dressing) over your injury. °· Preventing or treating infection. This may include antibiotic, antimicrobial, or antiseptic medicines. °Treatment may also include medicine for pain. °  Sometimes surgery is needed to close the wound with a flap of healthy skin or a piece of skin from another area of your body (graft). You may need surgery if other treatments are not working or if your injury is very deep. °HOME CARE INSTRUCTIONS °Wound Care °· Follow instructions from your health care provider about: °¨ How to take care of your wound. °¨ When and how you should change your  dressing. °¨ When you should remove your dressing. If your dressing is dry and stuck when you try to remove it, moisten or wet the dressing with saline or water so that it can be removed without harming your skin or wound tissue. °· Check your wound every day for signs of infection. Have a caregiver do this for you if you are not able. Watch for: °¨ More redness, swelling, or pain. °¨ More fluid, blood, or pus. °¨ A bad smell. °Skin Care °· Keep your skin clean and dry. Gently pat your skin dry. °· Do not rub or massage your skin. °· Use a skin protectant only as told by your health care provider. °· Check your skin every day for any changes in color or any new blisters or sores (ulcers). Have a caregiver do this for you if you are not able. °Medicines  °· Take over-the-counter and prescription medicines only as told by your health care provider. °· If you were prescribed an antibiotic medicine, take it or apply it as told by your health care provider. Do not stop taking or using the antibiotic even if your condition improves. °Reducing and Redistributing Pressure °· Do not lie or sit in one position for a long time. Move or change position every two hours or as told by your health care provider. °· Use pillows or cushions to reduce pressure. Ask your health care provider to recommend cushions or pads for you. °· Use medical devices that do not rub your skin. Tell your health care provider if one of your medical devices is causing a pressure injury to develop. °General Instructions °· Eat a healthy diet that includes lots of protein. Ask your health care provider for diet advice. °· Drink enough fluid to keep your urine clear or pale yellow. °· Be as active as you can every day. Ask your health care provider to suggest safe exercises or activities. °· Do not abuse drugs or alcohol. °· Keep all follow-up visits as told by your health care provider. This is important. °· Do not smoke. °SEEK MEDICAL CARE IF: °· You  have chills or fever. °· Your pain medicine is not helping. °· You have any changes in skin color. °· You have new blisters or sores. °· You develop warmth, redness, or swelling near a pressure injury. °· You have a bad odor or pus coming from your pressure injury. °· You lose control of your bowels or bladder. °· You develop new symptoms. °· Your wound does not improve after 1-2 weeks of treatment. °· You develop a new medical condition, such as diabetes, peripheral vascular disease, or conditions that affect your defense (immune) system. °  °This information is not intended to replace advice given to you by your health care provider. Make sure you discuss any questions you have with your health care provider. °  °Document Released: 07/26/2005 Document Revised: 04/16/2015 Document Reviewed: 12/04/2014 °[employer]vier Interactive Patient Education ©2016 [employer] ° °

## 2016-02-19 NOTE — Telephone Encounter (Signed)
Patient called and said the sore on his backside has gotten larger. I spoke to Dr.Aron and she said she wanted to refer patient to the [facility].  I advised patient.  He would like to go to [facility].  Patient said he can go anytime.

## 2016-02-19 NOTE — Progress Notes (Signed)
Subjective:    Patient ID: Nathan Cunningham, male    DOB: 19-Jun-1925, 82 y.o.   MRN: [id number]  HPI  Pt presents to the clinic today to follow up on his sacral ulcer. He originally saw Dr. [name] 02/03/16 for the same. She tried to put a hydrocolloid dressing on the ulcer, but it did not stick very well. She reevaluated it 02/05/16, no improvement. She gave him a RX for Triamcinolone-Eucerin ointment to use BID, and then apply protective ointment as needed. It has been 2 weeks and he has not noticed any improvement in the size of the ulcer, and it seems to be getting bigger. The area is still painful. He describes the pain as burning and aching. The pain seems worse at night. He also has pain in his anus, that he is not sure if it is related. He is constipated, has not had a BM in the last 10 days. He denies abdominal pain, nausea or vomiting. He does take Colace daily.  Review of Systems      Past Medical History  Diagnosis Date  . Hypertension   . Diabetes mellitus   . Hyperlipidemia   . Macular degeneration     Right eye  . Constipation   . ETOH abuse     binge drinking  . Dizziness   . Diverticulosis   . Adenomatous polyp of colon 03/2003  . Hemorrhoids   . Sleep apnea     on CPAP    Current Outpatient Prescriptions  Medication Sig Dispense Refill  . amitriptyline (ELAVIL) 75 MG tablet TAKE 1 TABLET BY MOUTH AT BEDTIME 90 tablet 0  . aspirin 325 MG tablet Take 162 mg by mouth daily.    . Blood Glucose Monitoring Suppl (ONE TOUCH ULTRA SYSTEM KIT) W/DEVICE KIT 1 kit by Does not apply route once. 1 each 0  . Chromium Picolinate (RA CHROMIUM PICOLINATE) 400 MCG TABS Take 1 tablet by mouth daily.      . cyanocobalamin (,VITAMIN B-12,) 1000 MCG/ML injection Inject 1050mg every 60days IM (every other month) 1 mL 0  . docusate sodium (COLACE) 100 MG capsule Take 100 mg by mouth 3 (three) times daily as needed.      . dorzolamide-timolol (COSOPT) 22.3-6.8 MG/ML ophthalmic solution  1 drop 2 (two) times daily.      . fish oil-omega-3 fatty acids 1000 MG capsule Take by mouth. Take six daily    . glipiZIDE (GLUCOTROL XL) 5 MG 24 hr tablet Take 1 tablet (5 mg total) by mouth daily. 90 tablet 1  . glucose blood (ONE TOUCH ULTRA TEST) test strip USE AS DIRECTED TO TEST BLOOD SUGAR ONCE DAILY 100 each 1  . Lancets (ONETOUCH ULTRASOFT) lancets Use as instructed 100 each 0  . Magnesium-Calcium-Folic Acid (MAGNEBIND 4676 400-200-1 MG TABS Take 1 tablet by mouth daily.      . Melatonin 5 MG CAPS Take 1 each by mouth daily.      . Multiple Vitamins-Minerals (EYE-VITES) TABS Take 1 tablet by mouth 2 (two) times daily.      . Potassium Gluconate (K-99) 595 MG CAPS Take 1 each by mouth daily.      . Saxagliptin-Metformin (KOMBIGLYZE XR) 12-998 MG TB24 Take 1 each by mouth daily. 90 tablet 1  . sitaGLIPtin (JANUVIA) 100 MG tablet Take 1 tablet (100 mg total) by mouth daily. 90 tablet 3  . telmisartan (MICARDIS) 80 MG tablet TAKE 1 TABLET DAILY 90 tablet 3  . Triamcinolone Acetonide (  TRIAMCINOLONE 0.1 % CREAM : EUCERIN) CREA Apply 1 application topically 3 (three) times daily as needed. 1 each 0   No current facility-administered medications for this visit.    No Known Allergies  Family History  Problem Relation Age of Onset  . Heart disease Father   . Stroke Father 52    Social History   Social History  . Marital Status: Unknown    Spouse Name: N/A  . Number of Children: 2  . Years of Education: N/A   Occupational History  . retired    Social History Main Topics  . Smoking status: Never Smoker   . Smokeless tobacco: Never Used  . Alcohol Use: 7.0 oz/week    14 drink(s) per week  . Drug Use: No  . Sexual Activity: No   Other Topics Concern  . Not on file   Social History Narrative   Retired from [employer]   Goes to [employer] 3 times a week.   Has a girlfriend.   Does not have a living will yet.   Desires CPR, does not prolonged life support if  futile.           Constitutional: Denies fever, malaise, fatigue, headache or abrupt weight changes.  Gastrointestinal: Pt reports constipation. Denies abdominal pain, bloating, diarrhea or blood in the stool.  Skin: Pt reports sacral ulcer. Denies redness, rashes, lesions .   No other specific complaints in a complete review of systems (except as listed in HPI above).  Objective:   Physical Exam   BP 118/72 mmHg  Pulse 79  Temp(Src) 98.7 F (37.1 C) (Oral)  Wt 201 lb 8 oz (91.4 kg) Wt Readings from Last 3 Encounters:  02/19/16 201 lb 8 oz (91.4 kg)  02/05/16 201 lb 4 oz (91.286 kg)  02/03/16 201 lb 12 oz (91.513 kg)    General: Appears his stated age, in NAD. Skin: 1.5 cm linear ulceration noted to gluteal fold. 1 cm round ulceration noted to gluteal fold. Cardiovascular: Normal rate and rhythm. S1,S2 noted.  . Pulmonary/Chest: Normal effort and positive vesicular breath sounds. No respiratory distress. No wheezes, rales or ronchi noted.  Abdomen: Distended but soft. Normal bowel sounds. No distention or masses noted.  Rectal: Impacted. No external hemorrhoids noted. Neurological: Alert and oriented.      BMET    Component Value Date/Time   NA 134* 01/16/2016 0924   NA 134* 12/01/2011 1159   K 4.4 01/16/2016 0924   K 4.7 12/01/2011 1159   CL 98* 01/16/2016 0924   CL 99 12/01/2011 1159   CO2 32 01/16/2016 0924   CO2 29 12/01/2011 1159   GLUCOSE 185* 01/16/2016 0924   GLUCOSE 167* 12/01/2011 1159   BUN 19 01/16/2016 0924   BUN 12 12/01/2011 1159   CREATININE 0.99 01/16/2016 0924   CREATININE 0.70 12/01/2011 1159   CALCIUM 8.9 01/16/2016 0924   CALCIUM 8.5 12/01/2011 1159   GFRNONAA >60 01/16/2016 0924   GFRNONAA >60 12/01/2011 1159   GFRAA >60 01/16/2016 0924   GFRAA >60 12/01/2011 1159    Lipid Panel     Component Value Date/Time   CHOL 123 12/04/2015 1046   TRIG 199.0* 12/04/2015 1046   HDL 30.90* 12/04/2015 1046   CHOLHDL 4 12/04/2015 1046    VLDL 39.8 12/04/2015 1046   LDLCALC 53 12/04/2015 1046    CBC    Component Value Date/Time   WBC 6.4 01/16/2016 0924   WBC 7.1 09/10/2014 1634  RBC 3.77* 01/16/2016 0924   RBC 3.95* 09/10/2014 1634   HGB 11.6* 01/16/2016 0924   HGB 12.2* 09/10/2014 1634   HCT 34.3* 01/16/2016 0924   HCT 36.8* 09/10/2014 1634   PLT 194 01/16/2016 0924   PLT 162 09/10/2014 1634   MCV 91.0 01/16/2016 0924   MCV 93 09/10/2014 1634   MCH 30.8 01/16/2016 0924   MCH 31.0 09/10/2014 1634   MCHC 33.8 01/16/2016 0924   MCHC 33.3 09/10/2014 1634   RDW 15.0* 01/16/2016 0924   RDW 14.5 09/10/2014 1634   LYMPHSABS 1.7 01/16/2016 0924   LYMPHSABS 2.2 09/10/2014 1634   MONOABS 1.8* 01/16/2016 0924   MONOABS 2.2* 09/10/2014 1634   EOSABS 0.1 01/16/2016 0924   EOSABS 0.0 09/10/2014 1634   BASOSABS 0.0 01/16/2016 0924   BASOSABS 0.0 09/10/2014 1634    Hgb A1C Lab Results  Component Value Date   HGBA1C 6.8* 12/04/2015        Assessment & Plan:   Sacral decubitus:  Worse Advised him to stop Triamcinolone Eucerin cream Apply a barrier ointment 3 x day Discussed keeping pressure off the sacrum He has an appt with the wound care center on Wednesday  Fecal impaction:  Manual removal by provider Start Mirilax daily in am, Colace in pm Use a glycerin suppository in 24 hours if no BM He declines fleets enema Red flags discussed- nausea, vomiting, abdominal pain, lack of flatulance  RTC as needed or if symptoms persist or worsen BAITY, REGINA, NP

## 2016-02-25 ENCOUNTER — Encounter: Payer: Medicare Other | Attending: Internal Medicine | Admitting: Internal Medicine

## 2016-02-25 DIAGNOSIS — L89152 Pressure ulcer of sacral region, stage 2: Secondary | ICD-10-CM | POA: Diagnosis not present

## 2016-02-25 DIAGNOSIS — Z7984 Long term (current) use of oral hypoglycemic drugs: Secondary | ICD-10-CM | POA: Diagnosis not present

## 2016-02-25 DIAGNOSIS — H3530 Unspecified macular degeneration: Secondary | ICD-10-CM | POA: Diagnosis not present

## 2016-02-25 DIAGNOSIS — E538 Deficiency of other specified B group vitamins: Secondary | ICD-10-CM | POA: Diagnosis not present

## 2016-02-25 DIAGNOSIS — R269 Unspecified abnormalities of gait and mobility: Secondary | ICD-10-CM | POA: Insufficient documentation

## 2016-02-25 DIAGNOSIS — L89150 Pressure ulcer of sacral region, unstageable: Secondary | ICD-10-CM | POA: Insufficient documentation

## 2016-02-25 DIAGNOSIS — M1990 Unspecified osteoarthritis, unspecified site: Secondary | ICD-10-CM | POA: Diagnosis not present

## 2016-02-25 DIAGNOSIS — I10 Essential (primary) hypertension: Secondary | ICD-10-CM | POA: Insufficient documentation

## 2016-02-25 DIAGNOSIS — G4730 Sleep apnea, unspecified: Secondary | ICD-10-CM | POA: Diagnosis not present

## 2016-02-25 DIAGNOSIS — E1142 Type 2 diabetes mellitus with diabetic polyneuropathy: Secondary | ICD-10-CM | POA: Diagnosis not present

## 2016-02-25 NOTE — Progress Notes (Addendum)
MYCHEAL, MEHARG ([id number]) Visit Report for 02/25/2016 Allergy List Details Gwenlyn Perking Date of Service: 02/25/2016 9:45 AM Patient Name: E. Patient Account Number: [id number] Medical Record Treating RN: Baruch Gouty RN, BSN, Velva Harman [id number] Number: Other Clinician: Date of Birth/Sex: Dec 24, 1924 (82 y.o. Male) Treating Dellia Nims, [place] Primary Care Physician/Extender: Spero Curb, Tanja Port Physician: Referring Physician: Arnette Norris Weeks in Treatment: 0 Allergies Active Allergies No Known Allergies Allergy Notes Electronic Signature(s) Signed: 02/25/2016 8:18:38 AM By: Regan Lemming BSN, RN Entered By: Regan Lemming on 02/25/2016 08:18:37 Stevphen [place] ([id number]) -------------------------------------------------------------------------------- Arrival Information Details Gwenlyn Perking Date of Service: 02/25/2016 9:45 AM Patient Name: E. Patient Account Number: [id number] Medical Record Treating RN: Baruch Gouty RN, BSN, Velva Harman [id number] Number: Other Clinician: Date of Birth/Sex: 1924/08/23 (82 y.o. Male) Treating Linton Ham Primary Care Physician/Extender: Spero Curb, Tanja Port Physician: Referring Physician: Morene Crocker in Treatment: 0 Visit Information Patient Arrived: Wheel Chair Arrival Time: 09:47 Accompanied By: wife Transfer Assistance: None Patient Identification Verified: Yes Secondary Verification Process Yes Completed: Patient Requires Transmission-Based No Precautions: Patient Has Alerts: No Electronic Signature(s) Signed: 02/26/2016 3:37:03 PM By: Regan Lemming BSN, RN Entered By: Regan Lemming on 02/25/2016 09:47:51 Stevphen [place] ([id number]) -------------------------------------------------------------------------------- Clinic Level of Care Assessment Details Gwenlyn Perking Date of Service: 02/25/2016 9:45 AM Patient Name: E. Patient Account Number: [id number] Medical Record Treating RN: Baruch Gouty RN, BSN, Velva Harman [id number] Number: Other  Clinician: Date of Birth/Sex: [birth year] (82 y.o. Male) Treating Linton Ham Primary Care Physician/Extender: Spero Curb, Tanja Port Physician: Referring Physician: Morene Crocker in Treatment: 0 Clinic Level of Care Assessment Items TOOL 1 Quantity Score []  - Use when EandM and Procedure is performed on INITIAL visit 0 ASSESSMENTS - Nursing Assessment / Reassessment X - General Physical Exam (combine w/ comprehensive assessment (listed just 1 20 below) when performed on new pt. evals) X - Comprehensive Assessment (HX, ROS, Risk Assessments, Wounds Hx, etc.) 1 25 ASSESSMENTS - Wound and Skin Assessment / Reassessment []  - Dermatologic / Skin Assessment (not related to wound area) 0 ASSESSMENTS - Ostomy and/or Continence Assessment and Care []  - Incontinence Assessment and Management 0 []  - Ostomy Care Assessment and Management (repouching, etc.) 0 PROCESS - Coordination of Care X - Simple Patient / Family Education for ongoing care 1 15 []  - Complex (extensive) Patient / Family Education for ongoing care 0 X - Staff obtains Programmer, systems, Records, Test Results / Process Orders 1 10 X - Staff telephones HHA, Nursing Homes / Clarify orders / etc 1 10 []  - Routine Transfer to another Facility (non-emergent condition) 0 []  - Routine Hospital Admission (non-emergent condition) 0 X - New Admissions / Biomedical engineer / Ordering NPWT, Apligraf, etc. 1 15 []  - Emergency Hospital Admission (emergent condition) 0 PROCESS - Special Needs []  - Pediatric / Minor Patient Management 0 FAVION, ROSENCRANCE ([id number]) []  - Isolation Patient Management 0 []  - Hearing / Language / Visual special needs 0 []  - Assessment of Community assistance (transportation, D/C planning, etc.) 0 []  - Additional assistance / Altered mentation 0 []  - Support Surface(s) Assessment (bed, cushion, seat, etc.) 0 INTERVENTIONS - Miscellaneous []  - External ear exam 0 []  - Patient Transfer (multiple staff / Librarian, academic / Similar devices) 0 []  - Simple Staple / Suture removal (25 or less) 0 []  - Complex Staple / Suture removal (26 or more) 0 []  - Hypo/Hyperglycemic Management (do not check if billed separately) 0 []  - Ankle / Brachial Index (ABI) - do not check if billed separately 0 Has  the patient been seen at the hospital within the last three years: Yes Total Score: 95 Level Of Care: New/Established - Level 3 Electronic Signature(s) Signed: 02/26/2016 3:37:03 PM By: Regan Lemming BSN, RN Entered By: Regan Lemming on 02/25/2016 10:27:18 Stevphen [place] ([id number]) -------------------------------------------------------------------------------- Encounter Discharge Information Details Gwenlyn Perking Date of Service: 02/25/2016 9:45 AM Patient Name: E. Patient Account Number: [id number] Medical Record Treating RN: Baruch Gouty RN, BSN, Velva Harman [id number] Number: Other Clinician: Date of Birth/Sex: 1925-05-14 (82 y.o. Male) Treating Linton Ham Primary Care Physician/Extender: Spero Curb, Tanja Port Physician: Referring Physician: Morene Crocker in Treatment: 0 Encounter Discharge Information Items Discharge Pain Level: 0 Discharge Condition: Stable Ambulatory Status: Wheelchair Discharge Destination: Home Transportation: Private Auto Accompanied By: wife Schedule Follow-up Appointment: No Medication Reconciliation completed and provided to Patient/Care No Normand Damron: Provided on Clinical Summary of Care: 02/25/2016 Form Type Recipient Paper Patient RW Electronic Signature(s) Signed: 02/25/2016 10:30:51 AM By: Ruthine Dose Entered By: Ruthine Dose on 02/25/2016 10:30:51 Stevphen [place] ([id number]) -------------------------------------------------------------------------------- Lower Extremity Assessment Details Gwenlyn Perking Date of Service: 02/25/2016 9:45 AM Patient Name: E. Patient Account Number: [id number] Medical Record Treating RN: Baruch Gouty RN, BSN,  Velva Harman [id number] Number: Other Clinician: Date of Birth/Sex: 1924/09/12 (82 y.o. Male) Treating Linton Ham Primary Care Physician/Extender: Spero Curb, Tanja Port Physician: Referring Physician: Arnette Norris Weeks in Treatment: 0 Electronic Signature(s) Signed: 02/26/2016 3:37:03 PM By: Regan Lemming BSN, RN Entered By: Regan Lemming on 02/25/2016 09:50:52 Stevphen [place] ([id number]) -------------------------------------------------------------------------------- Multi Wound Chart Details Gwenlyn Perking Date of Service: 02/25/2016 9:45 AM Patient Name: E. Patient Account Number: [id number] Medical Record Treating RN: Baruch Gouty RN, BSN, Velva Harman [id number] Number: Other Clinician: Date of Birth/Sex: Feb 22, 1925 (82 y.o. Male) Treating Linton Ham Primary Care Physician/Extender: Spero Curb, Tanja Port Physician: Referring Physician: Arnette Norris Weeks in Treatment: 0 Vital Signs Height(in): 67 Pulse(bpm): 91 Weight(lbs): 200 Blood Pressure 114/92 (mmHg): Body Mass Index(BMI): 31 Temperature(F): 98.2 Respiratory Rate 18 (breaths/min): Photos: [1:No Photos] [N/A:N/A] Wound Location: [1:Coccyx - Medial] [N/A:N/A] Wounding Event: [1:Pressure Injury] [N/A:N/A] Primary Etiology: [1:Pressure Ulcer] [N/A:N/A] Comorbid History: [1:Sleep Apnea, Arrhythmia, Hypertension, Type II Diabetes, History of pressure wounds, Osteoarthritis] [N/A:N/A] Date Acquired: [1:12/17/2015] [N/A:N/A] Weeks of Treatment: [1:0] [N/A:N/A] Wound Status: [1:Open] [N/A:N/A] Measurements L x W x D 2.6x1.5x0.2 [N/A:N/A] (cm) Area (cm) : [1:3.063] [N/A:N/A] Volume (cm) : [1:0.613] [N/A:N/A] Classification: [1:Category/Stage II] [N/A:N/A] Exudate Amount: [1:Medium] [N/A:N/A] Exudate Type: [1:Serosanguineous] [N/A:N/A] Exudate Color: [1:red, brown] [N/A:N/A] Wound Margin: [1:Distinct, outline attached] [N/A:N/A] Granulation Amount: [1:None Present (0%)] [N/A:N/A] Necrotic Amount: [1:Large (67-100%)]  [N/A:N/A] Exposed Structures: [1:Fascia: No Fat: No Tendon: No Muscle: No] [N/A:N/A] Joint: No Bone: No Limited to Skin Breakdown Epithelialization: None N/A N/A Periwound Skin Texture: Edema: No N/A N/A Excoriation: No Induration: No Callus: No Crepitus: No Fluctuance: No Friable: No Rash: No Scarring: No Periwound Skin Moist: Yes N/A N/A Moisture: Maceration: No Dry/Scaly: No Periwound Skin Color: Atrophie Blanche: No N/A N/A Cyanosis: No Ecchymosis: No Erythema: No Hemosiderin Staining: No Mottled: No Pallor: No Rubor: No Temperature: No Abnormality N/A N/A Tenderness on Yes N/A N/A Palpation: Wound Preparation: Ulcer Cleansing: N/A N/A Rinsed/Irrigated with Saline Topical Anesthetic Applied: Other: lidocaine 4% Treatment Notes Electronic Signature(s) Signed: 02/25/2016 10:07:15 AM By: Regan Lemming BSN, RN Entered By: Regan Lemming on 02/25/2016 10:07:14 Stevphen [place] ([id number]) -------------------------------------------------------------------------------- Multi-Disciplinary Care Plan Details Gwenlyn Perking Date of Service: 02/25/2016 9:45 AM Patient Name: E. Patient Account Number: [id number] Medical Record Treating RN: Baruch Gouty RN, BSN, Velva Harman [id number] Number: Other Clinician: Date of Birth/Sex: 13-Oct-1924 (82 y.o.  Male) Treating Dellia Nims, [place] Primary Care Physician/Extender: Spero Curb, Tanja Port Physician: Referring Physician: Morene Crocker in Treatment: 0 Active Inactive Electronic Signature(s) Signed: 04/06/2016 12:47:03 PM By: Gretta Cool, RN, BSN, Kim RN, BSN Signed: 04/06/2016 3:28:09 PM By: Regan Lemming BSN, RN Previous Signature: 02/25/2016 10:06:25 AM Version By: Regan Lemming BSN, RN Entered By: Gretta Cool, RN, BSN, Kim on 03/30/2016 13:18:39 Stevphen [place] ([id number]) -------------------------------------------------------------------------------- Pain Assessment Details Gwenlyn Perking Date of Service: 02/25/2016 9:45 AM Patient  Name: E. Patient Account Number: [id number] Medical Record Treating RN: Baruch Gouty, RN, BSN, Velva Harman [id number] Number: Other Clinician: Date of Birth/Sex: 1925/07/28 (82 y.o. Male) Treating ROBSON, MICHAEL Primary Care Physician/Extender: Spero Curb, Tanja Port Physician: Referring Physician: Morene Crocker in Treatment: 0 Active Problems Location of Pain Severity and Description of Pain Patient Has Paino No Site Locations With Dressing Change: No Pain Management and Medication Current Pain Management: Electronic Signature(s) Signed: 02/25/2016 10:04:49 AM By: Regan Lemming BSN, RN Entered By: Regan Lemming on 02/25/2016 10:04:49 Stevphen [place] ([id number]) -------------------------------------------------------------------------------- Patient/Caregiver Education Details Gwenlyn Perking Date of Service: 02/25/2016 9:45 AM Patient Name: E. Patient Account Number: [id number] Medical Record Treating RN: Baruch Gouty, RN, BSN, Velva Harman [id number] Number: Other Clinician: Date of Birth/Gender: 06-13-1925 (82 y.o. Male) Treating ROBSON, MICHAEL Primary Care Physician/Extender: Spero Curb, Tanja Port Physician: Suella [place] in Treatment: 0 Referring Physician: Arnette Norris Education Assessment Education Provided To: Patient Education Topics Provided Basic Hygiene: Methods: Explain/Verbal Responses: State content correctly Safety: Methods: Explain/Verbal Responses: State content correctly Welcome To [facility]: Methods: Explain/Verbal Responses: State content correctly Wound/Skin Impairment: Methods: Explain/Verbal Responses: State content correctly Electronic Signature(s) Signed: 02/26/2016 3:37:03 PM By: Regan Lemming BSN, RN Entered By: Regan Lemming on 02/25/2016 10:29:31 Stevphen [place] ([id number]) -------------------------------------------------------------------------------- Wound Assessment Details Gwenlyn Perking Date of Service: 02/25/2016 9:45 AM Patient Name: E. Patient Account  Number: [id number] Medical Record Treating RN: Baruch Gouty, RN, BSN, Velva Harman [id number] Number: Other Clinician: Date of Birth/Sex: 12/07/1924 (82 y.o. Male) Treating ROBSON, MICHAEL Primary Care Physician/Extender: Spero Curb, Tanja Port Physician: Referring Physician: Arnette Norris Weeks in Treatment: 0 Wound Status Wound Number: 1 Primary Pressure Ulcer Etiology: Wound Location: Coccyx - Medial Wound Open Wounding Event: Pressure Injury Status: Date Acquired: 12/17/2015 Comorbid Sleep Apnea, Arrhythmia, Weeks Of Treatment: 0 History: Hypertension, Type II Diabetes, History Clustered Wound: No of pressure wounds, Osteoarthritis Photos Photo Uploaded By: Regan Lemming on 02/25/2016 10:48:41 Wound Measurements Length: (cm) 2.6 Width: (cm) 1.5 Depth: (cm) 0.2 Area: (cm) 3.063 Volume: (cm) 0.613 % Reduction in Area: % Reduction in Volume: Epithelialization: None Tunneling: No Undermining: No Wound Description Classification: Category/Stage II Wound Margin: Distinct, outline attached Exudate Amount: Medium Exudate Type: Serosanguineous Exudate Color: red, brown Foul Odor After Cleansing: No Wound Bed Granulation Amount: None Present (0%) Exposed Structure HIPOLITO, ALLTOP ([id number]) Necrotic Amount: Large (67-100%) Fascia Exposed: No Necrotic Quality: Adherent Slough Fat Layer Exposed: No Tendon Exposed: No Muscle Exposed: No Joint Exposed: No Bone Exposed: No Limited to Skin Breakdown Periwound Skin Texture Texture Color No Abnormalities Noted: No No Abnormalities Noted: No Callus: No Atrophie Blanche: No Crepitus: No Cyanosis: No Excoriation: No Ecchymosis: No Fluctuance: No Erythema: No Friable: No Hemosiderin Staining: No Induration: No Mottled: No Localized Edema: No Pallor: No Rash: No Rubor: No Scarring: No Temperature / Pain Moisture Temperature: No Abnormality No Abnormalities Noted: No Tenderness on Palpation: Yes Dry / Scaly: No Maceration:  No Moist: Yes Wound Preparation Ulcer Cleansing: Rinsed/Irrigated with Saline Topical Anesthetic Applied: Other: lidocaine 4%, Electronic Signature(s) Signed: 02/26/2016 3:37:03 PM By:  Afful, Velva Harman BSN, RN Entered By: Regan Lemming on 02/25/2016 09:55:11 Stevphen [place] ([id number]) -------------------------------------------------------------------------------- Vitals Details Gwenlyn Perking Date of Service: 02/25/2016 9:45 AM Patient Name: E. Patient Account Number: [id number] Medical Record Treating RN: Baruch Gouty RN, BSN, Velva Harman [id number] Number: Other Clinician: Date of Birth/Sex: 1925-07-06 (82 y.o. Male) Treating ROBSON, [place] Primary Care Physician/Extender: Spero Curb, Tanja Port Physician: Referring Physician: Morene Crocker in Treatment: 0 Vital Signs Time Taken: 09:51 Temperature (F): 98.2 Height (in): 67 Pulse (bpm): 91 Source: Stated Respiratory Rate (breaths/min): 18 Weight (lbs): 200 Blood Pressure (mmHg): 114/92 Source: Stated Reference Range: 80 - 120 mg / dl Body Mass Index (BMI): 31.3 Electronic Signature(s) Signed: 02/26/2016 3:37:03 PM By: Regan Lemming BSN, RN Entered By: Regan Lemming on 02/25/2016 09:52:35

## 2016-02-27 NOTE — Progress Notes (Signed)
[name], [name] ([id number]) Visit Report for 02/25/2016 Chief Complaint Document Details Nathan Cunningham Date of Service: 02/25/2016 9:45 AM Patient Name: E. Patient Account Number: [id number] Medical Record Treating RN: Baruch Gouty RN, BSN, Velva Harman [id number] Number: Other Clinician: Date of Birth/Sex: 08-06-1925 (82 y.o. Male) Treating Dellia Nims, [place] Primary Care Physician/Extender: Spero Curb, Tanja Port Physician: Referring Physician: Morene Crocker in Treatment: 0 Information Obtained from: Patient Chief Complaint 02/25/16; patient is here for review of a wound over his coccyx area Electronic Signature(s) Signed: 02/25/2016 4:29:49 PM By: Linton Ham MD Entered By: Linton Ham on 02/25/2016 12:34:31 Nathan Cunningham ([id number]) -------------------------------------------------------------------------------- Debridement Details Nathan Cunningham Date of Service: 02/25/2016 9:45 AM Patient Name: E. Patient Account Number: [id number] Medical Record Treating RN: Baruch Gouty RN, BSN, Velva Harman [id number] Number: Other Clinician: Date of Birth/Sex: [birth year] (82 y.o. Male) Treating Sherrel Ploch Primary Care Physician/Extender: Spero Curb, Tanja Port Physician: Referring Physician: Morene Crocker in Treatment: 0 Debridement Performed for Wound #1 Medial Coccyx Assessment: Performed By: Physician Ricard Dillon, MD Debridement: Debridement Pre-procedure Yes Verification/Time Out Taken: Start Time: 10:12 Pain Control: Lidocaine 4% Topical Solution Level: Skin/Subcutaneous Tissue Total Area Debrided (L x 2.6 (cm) x 1.5 (cm) = 3.9 (cm) W): Tissue and other Non-Viable, Fibrin/Slough, Subcutaneous material debrided: Instrument: Curette Bleeding: Minimum Hemostasis Achieved: Pressure End Time: 10:15 Procedural Pain: 0 Post Procedural Pain: 0 Response to Treatment: Procedure was tolerated well Post Debridement Measurements of Total Wound Length: (cm) 2.6 Stage:  Category/Stage II Width: (cm) 1.5 Depth: (cm) 0.2 Volume: (cm) 0.613 Post Procedure Diagnosis Same as Pre-procedure Electronic Signature(s) Signed: 02/25/2016 4:29:49 PM By: Linton Ham MD Signed: 02/26/2016 3:37:03 PM By: Regan Lemming BSN, RN Entered By: Linton Ham on 02/25/2016 12:34:00 Nathan Cunningham ([id number]) Nathan Cunningham ([id number]) -------------------------------------------------------------------------------- HPI Details Nathan Cunningham Date of Service: 02/25/2016 9:45 AM Patient Name: E. Patient Account Number: [id number] Medical Record Treating RN: Baruch Gouty RN, BSN, Velva Harman [id number] Number: Other Clinician: Date of Birth/Sex: 1924-10-20 (82 y.o. Male) Treating Linton Ham Primary Care Physician/Extender: Spero Curb, Tanja Port Physician: Referring Physician: Arnette Norris Weeks in Treatment: 0 History of Present Illness HPI Description: 02/25/16; the patient did arrives today accompanied by his caregiver/girlfriend. He is here for review of a wound over his coccyx that they think is been there since may. They relate this to a fall he had with trauma to the area and state that he was seen in the ER the next day after this for a second fall. It was then today became aware of the wound although admittedly they are not certain that it hadn't been there before that. Patient has diabetic neuropathy and a history of falls. He also is been followed by oncology for chronic myelomonocytic leukemia. According to his friend the falls are related to diabetic polyneuropathy as he is a type II diabetic. She describes increasing immobility. States that he sits up in a lift chair at night because he cannot get in and out of the bed to go to the bathroom. No doubt this is contributed to the wound. They have been using Neosporin and most likely recently triamcinolone cream and prior to that Neosporin with no improvement. Electronic Signature(s) Signed: 02/25/2016 4:29:49 PM  By: Linton Ham MD Entered By: Linton Ham on 02/25/2016 12:38:09 Nathan Cunningham ([id number]) -------------------------------------------------------------------------------- Physical Exam Details Nathan Cunningham Date of Service: 02/25/2016 9:45 AM Patient Name: E. Patient Account Number: [id number] Medical Record Treating RN: Baruch Gouty RN, BSN, Velva Harman [id number] Number: Other Clinician: Date of Birth/Sex: [birth year] (82 y.o. Male) Treating  Linton Ham Primary Care Physician/Extender: Spero Curb, Tanja Port Physician: Referring Physician: Arnette Norris Weeks in Treatment: 0 Constitutional Sitting or standing Blood Pressure is within target range for patient.. Pulse regular and within target range for patient.Marland Kitchen Respirations regular, non-labored and within target range.. Temperature is normal and within the target range for the patient.. Somewhat parkinsonian-looking man who preferred to sit up on the side of the exam table... Eyes Conjunctivae clear. No discharge.. Cardiovascular Heart rhythm and rate regular, without murmur or gallop.. Extremities are free of varicosities, clubbing or edema. Peripheral pulses strong and equal. Capillary refill < 3 seconds.. Gastrointestinal (GI) Distended with a ventral hernia. No masses. No liver or spleen enlargement or tenderness.. Musculoskeletal Patient walks flexed at the hips and knees. He has a lot of trouble even standing the pivot transfer.. Neurological Pressure ulcer technically unstageable over his coccyx although likely stage II. Diffusely hyporeflexic. I could not detect any increase in tone. Psychiatric No evidence of depression, anxiety, or agitation. Calm, cooperative, and communicative. Appropriate interactions and affect.. Notes Wound exam; patient has a small linear ulcer in 2 parts over his coccyx. Both of these covered with a tightly adherent surface eschar which was debridement with some difficulty with a curette.  There is no evidence of surrounding infection. Electronic Signature(s) Signed: 02/25/2016 4:29:49 PM By: Linton Ham MD Entered By: Linton Ham on 02/25/2016 12:40:47 Nathan Cunningham ([id number]) -------------------------------------------------------------------------------- Physician Orders Details Nathan Cunningham Date of Service: 02/25/2016 9:45 AM Patient Name: E. Patient Account Number: [id number] Medical Record Treating RN: Baruch Gouty RN, BSN, Velva Harman [id number] Number: Other Clinician: Date of Birth/Sex: February 16, 1925 (82 y.o. Male) Treating Linton Ham Primary Care Physician/Extender: Spero Curb, Tanja Port Physician: Referring Physician: Morene Crocker in Treatment: 0 Verbal / Phone Orders: Yes Clinician: Afful, RN, BSN, Rita Read Back and Verified: Yes Diagnosis Coding Wound Cleansing Wound #1 Medial Coccyx o Clean wound with Normal Saline. Anesthetic Wound #1 Medial Coccyx o Topical Lidocaine 4% cream applied to wound bed prior to debridement Skin Barriers/Peri-Wound Care Wound #1 Medial Coccyx o Skin Prep Primary Wound Dressing Wound #1 Medial Coccyx o Santyl Ointment Secondary Dressing Wound #1 Medial Coccyx o Dry Gauze o Boardered Foam Dressing Dressing Change Frequency Wound #1 Medial Coccyx o Change dressing every day. Follow-up Appointments Wound #1 Medial Coccyx o Return Appointment in 1 week. Off-Loading Wound #1 Medial Coccyx o Turn and reposition every 2 hours Nathan Cunningham, Nathan Cunningham ([id number]) o Other: - Patient instructed to stay out of his recliner and rotation sides Additional Orders / Instructions Wound #1 Medial Coccyx o Increase protein intake. o Activity as tolerated o Other: - Include these vitamins in your diet MVI, Zinc, Vitamin C, Vitamin A Electronic Signature(s) Signed: 02/25/2016 4:29:49 PM By: Linton Ham MD Signed: 02/26/2016 3:37:03 PM By: Regan Lemming BSN, RN Entered By: Regan Lemming on  02/25/2016 10:20:10 Nathan Cunningham ([id number]) -------------------------------------------------------------------------------- Problem List Details Nathan Cunningham Date of Service: 02/25/2016 9:45 AM Patient Name: E. Patient Account Number: [id number] Medical Record Treating RN: Baruch Gouty RN, BSN, Velva Harman [id number] Number: Other Clinician: Date of Birth/Sex: Feb 16, 1925 (82 y.o. Male) Treating Linton Ham Primary Care Physician/Extender: Spero Curb, Tanja Port Physician: Referring Physician: Morene Crocker in Treatment: 0 Active Problems ICD-10 Encounter Code Description Active Date Diagnosis L89.150 Pressure ulcer of sacral region, unstageable 02/25/2016 Yes E11.42 Type 2 diabetes mellitus with diabetic polyneuropathy 02/25/2016 Yes R29.6 Repeated falls 02/25/2016 Yes Inactive Problems Resolved Problems Electronic Signature(s) Signed: 02/25/2016 4:29:49 PM By: Linton Ham MD Entered By: Linton Ham on 02/25/2016  12:32:13 Nathan Cunningham, Nathan Cunningham ([id number]) -------------------------------------------------------------------------------- Progress Note Details Nathan Cunningham Date of Service: 02/25/2016 9:45 AM Patient Name: E. Patient Account Number: [id number] Medical Record Treating RN: Baruch Gouty RN, BSN, Velva Harman [id number] Number: Other Clinician: Date of Birth/Sex: [birth year] (82 y.o. Male) Treating Dellia Nims, [place] Primary Care Physician/Extender: Spero Curb, Tanja Port Physician: Referring Physician: Morene Crocker in Treatment: 0 Subjective Chief Complaint Information obtained from Patient 02/25/16; patient is here for review of a wound over his coccyx area History of Present Illness (HPI) 02/25/16; the patient did arrives today accompanied by his caregiver/girlfriend. He is here for review of a wound over his coccyx that they think is been there since may. They relate this to a fall he had with trauma to the area and state that he was seen in the ER the next day after  this for a second fall. It was then today became aware of the wound although admittedly they are not certain that it hadn't been there before that. Patient has diabetic neuropathy and a history of falls. He also is been followed by oncology for chronic myelomonocytic leukemia. According to his friend the falls are related to diabetic polyneuropathy as he is a type II diabetic. She describes increasing immobility. States that he sits up in a lift chair at night because he cannot get in and out of the bed to go to the bathroom. No doubt this is contributed to the wound. They have been using Neosporin and most likely recently triamcinolone cream and prior to that Neosporin with no improvement. Wound History Patient presents with 1 open wound that has been present for approximately since may. Patient has been treating wound in the following manner: nosporin. Laboratory tests have not been performed in the last month. Patient reportedly has not tested positive for an antibiotic resistant organism. Patient reportedly has not tested positive for osteomyelitis. Patient History Information obtained from Patient, Caregiver. Allergies No Known Allergies Social History Never smoker, Marital Status - Married, Alcohol Use - Rarely, Drug Use - No History, Caffeine Use - Rarely. Medical History Eyes Nathan Cunningham, Nathan Cunningham ([id number]) Patient has history of Cataracts - removed left eye Denies history of Glaucoma, Optic Neuritis Respiratory Patient has history of Sleep Apnea Cardiovascular Patient has history of Arrhythmia, Hypertension Denies history of Hypotension Gastrointestinal Denies history of Cirrhosis , Colitis, Crohn s, Hepatitis A, Hepatitis B, Hepatitis C Endocrine Patient has history of Type II Diabetes Immunological Denies history of Lupus Erythematosus, Raynaud s, Scleroderma Integumentary (Skin) Patient has history of History of pressure wounds Musculoskeletal Patient has history  of Osteoarthritis Psychiatric Denies history of Anorexia/bulimia, Confinement Anxiety Patient is treated with Oral Agents. Blood sugar is tested. Blood sugar results noted at the following times: Breakfast - 160. Medical And Surgical History Notes Eyes macular degeneration Ear/Nose/Mouth/Throat glossitis Hematologic/Lymphatic B12 deficiency Gastrointestinal Constipation Genitourinary Nocturia Musculoskeletal erectyle dysfunction Neurologic Cervical disc myelopathy, thoracic degenerative disease Oncologic Myelomonocytic leukemia Review of Systems (ROS) Constitutional Symptoms (General Health) Complains or has symptoms of Fatigue. Eyes Complains or has symptoms of Glasses / Contacts. Ear/Nose/Mouth/Throat The patient has no complaints or symptoms. Hematologic/Lymphatic The patient has no complaints or symptoms. Respiratory Nathan Cunningham, Nathan Cunningham ([id number]) The patient has no complaints or symptoms. Cardiovascular The patient has no complaints or symptoms. Gastrointestinal The patient has no complaints or symptoms. Endocrine The patient has no complaints or symptoms. Genitourinary The patient has no complaints or symptoms. Immunological The patient has no complaints or symptoms. Integumentary (Skin) Complains or has symptoms of Wounds, Breakdown. Musculoskeletal The  patient has no complaints or symptoms. Neurologic The patient has no complaints or symptoms. Oncologic The patient has no complaints or symptoms. Psychiatric The patient has no complaints or symptoms. Objective Constitutional Sitting or standing Blood Pressure is within target range for patient.. Pulse regular and within target range for patient.Marland Kitchen Respirations regular, non-labored and within target range.. Temperature is normal and within the target range for the patient.. Somewhat parkinsonian-looking man who preferred to sit up on the side of the exam table... Vitals Time Taken: 9:51 AM, Height:  67 in, Source: Stated, Weight: 200 lbs, Source: Stated, BMI: 31.3, Temperature: 98.2 F, Pulse: 91 bpm, Respiratory Rate: 18 breaths/min, Blood Pressure: 114/92 mmHg. Eyes Conjunctivae clear. No discharge.. Cardiovascular Heart rhythm and rate regular, without murmur or gallop.. Extremities are free of varicosities, clubbing or edema. Peripheral pulses strong and equal. Capillary refill < 3 seconds.. Gastrointestinal (GI) Distended with a ventral hernia. No masses. No liver or spleen enlargement or tenderness.. Musculoskeletal Nathan Cunningham, Nathan Cunningham ([id number]) Patient walks flexed at the hips and knees. He has a lot of trouble even standing the pivot transfer.. Neurological Pressure ulcer technically unstageable over his coccyx although likely stage II. Diffusely hyporeflexic. I could not detect any increase in tone. Psychiatric No evidence of depression, anxiety, or agitation. Calm, cooperative, and communicative. Appropriate interactions and affect.. General Notes: Wound exam; patient has a small linear ulcer in 2 parts over his coccyx. Both of these covered with a tightly adherent surface eschar which was debridement with some difficulty with a curette. There is no evidence of surrounding infection. Integumentary (Hair, Skin) Wound #1 status is Open. Original cause of wound was Pressure Injury. The wound is located on the Medial Coccyx. The wound measures 2.6cm length x 1.5cm width x 0.2cm depth; 3.063cm^2 area and 0.613cm^3 volume. The wound is limited to skin breakdown. There is no tunneling or undermining noted. There is a medium amount of serosanguineous drainage noted. The wound margin is distinct with the outline attached to the wound base. There is no granulation within the wound bed. There is a large (67- 100%) amount of necrotic tissue within the wound bed including Adherent Slough. The periwound skin appearance exhibited: Moist. The periwound skin appearance did not exhibit:  Callus, Crepitus, Excoriation, Fluctuance, Friable, Induration, Localized Edema, Rash, Scarring, Dry/Scaly, Maceration, Atrophie Blanche, Cyanosis, Ecchymosis, Hemosiderin Staining, Mottled, Pallor, Rubor, Erythema. Periwound temperature was noted as No Abnormality. The periwound has tenderness on palpation. Assessment Active Problems ICD-10 L89.150 - Pressure ulcer of sacral region, unstageable E11.42 - Type 2 diabetes mellitus with diabetic polyneuropathy R29.6 - Repeated falls Procedures Wound #1 Wound #1 is a Pressure Ulcer located on the Medial Coccyx . There was a Skin/Subcutaneous Tissue Debridement [id number]) debridement with total area of 3.9 sq cm performed by Ricard Dillon, MD. with the following instrument(s): Curette to remove Non-Viable tissue/material including Nathan Cunningham, Nathan Cunningham. ([id number]) Fibrin/Slough and Subcutaneous after achieving pain control using Lidocaine 4% Topical Solution. A time out was conducted prior to the start of the procedure. A Minimum amount of bleeding was controlled with Pressure. The procedure was tolerated well with a pain level of 0 throughout and a pain level of 0 following the procedure. Post Debridement Measurements: 2.6cm length x 1.5cm width x 0.2cm depth; 0.613cm^3 volume. Post debridement Stage noted as Category/Stage II. Post procedure Diagnosis Wound #1: Same as Pre-Procedure Plan Wound Cleansing: Wound #1 Medial Coccyx: Clean wound with Normal Saline. Anesthetic: Wound #1 Medial Coccyx: Topical Lidocaine 4% cream applied to wound bed  prior to debridement Skin Barriers/Peri-Wound Care: Wound #1 Medial Coccyx: Skin Prep Primary Wound Dressing: Wound #1 Medial Coccyx: Santyl Ointment Secondary Dressing: Wound #1 Medial Coccyx: Dry Gauze Boardered Foam Dressing Dressing Change Frequency: Wound #1 Medial Coccyx: Change dressing every day. Follow-up Appointments: Wound #1 Medial Coccyx: Return Appointment in 1  week. Off-Loading: Wound #1 Medial Coccyx: Turn and reposition every 2 hours Other: - Patient instructed to stay out of his recliner and rotation sides Additional Orders / Instructions: Wound #1 Medial Coccyx: Increase protein intake. Activity as tolerated Other: - Include these vitamins in your diet MVI, Zinc, Vitamin C, Vitamin A Nathan Cunningham, Nathan Cunningham. ([id number]) #1 we use Santyl and border foam which will need to be changed every second day. #2 he does not look malnourished however he is very immobile certainly I think this pressure area may have actually preceded his falls. #3 I have discussed the importance of offloading this area including not sleeping in a lift chair at night. #4 he might actually be eligible for a hospital bed and a pressure-relief surface. #5 I thought he might have Parkinson's disease looking at him however I could detect no increased tone or rigidity no bradykinesia. Electronic Signature(s) Signed: 02/25/2016 4:29:49 PM By: Linton Ham MD Entered By: Linton Ham on 02/25/2016 12:42:32 Nathan Cunningham ([id number]) -------------------------------------------------------------------------------- ROS/PFSH Details Nathan Cunningham Date of Service: 02/25/2016 9:45 AM Patient Name: E. Patient Account Number: [id number] Medical Record Treating RN: Baruch Gouty, RN, BSN, Velva Harman [id number] Number: Other Clinician: Date of Birth/Sex: 1925-04-20 (82 y.o. Male) Treating Kerly Rigsbee Primary Care Physician/Extender: Spero Curb, Tanja Port Physician: Referring Physician: Morene Crocker in Treatment: 0 Information Obtained From Patient Caregiver Wound History Do you currently have one or more open woundso Yes How many open wounds do you currently haveo 1 Approximately how long have you had your woundso since may How have you been treating your wound(s) until nowo nosporin Has your wound(s) ever healed and then re-openedo No Have you had any lab work done in the  past montho No Have you tested positive for an antibiotic resistant organism (MRSA, VRE)o No Have you tested positive for osteomyelitis (bone infection)o No Constitutional Symptoms (General Health) Complaints and Symptoms: Positive for: Fatigue Eyes Complaints and Symptoms: Positive for: Glasses / Contacts Medical History: Positive for: Cataracts - removed left eye Negative for: Glaucoma; Optic Neuritis Past Medical History Notes: macular degeneration Integumentary (Skin) Complaints and Symptoms: Positive for: Wounds; Breakdown Medical History: Positive for: History of pressure wounds Ear/Nose/Mouth/Throat Nathan Cunningham, Nathan Cunningham ([id number]) Complaints and Symptoms: No Complaints or Symptoms Medical History: Past Medical History Notes: glossitis Hematologic/Lymphatic Complaints and Symptoms: No Complaints or Symptoms Medical History: Past Medical History Notes: B12 deficiency Respiratory Complaints and Symptoms: No Complaints or Symptoms Medical History: Positive for: Sleep Apnea Cardiovascular Complaints and Symptoms: No Complaints or Symptoms Medical History: Positive for: Arrhythmia; Hypertension Negative for: Hypotension Gastrointestinal Complaints and Symptoms: No Complaints or Symptoms Medical History: Negative for: Cirrhosis ; Colitis; Crohnos; Hepatitis A; Hepatitis B; Hepatitis C Past Medical History Notes: Constipation Endocrine Complaints and Symptoms: No Complaints or Symptoms Medical History: Positive for: Type II Diabetes Time with diabetes: since 1990 Nathan Cunningham, Nathan Cunningham ([id number]) Treated with: Oral agents Blood sugar tested every day: Yes Tested : daily Blood sugar testing results: Breakfast: 160 Genitourinary Complaints and Symptoms: No Complaints or Symptoms Medical History: Past Medical History Notes: Nocturia Immunological Complaints and Symptoms: No Complaints or Symptoms Medical History: Negative for: Lupus Erythematosus;  Raynaudos; Scleroderma Musculoskeletal Complaints and Symptoms: No Complaints  or Symptoms Medical History: Positive for: Osteoarthritis Past Medical History Notes: erectyle dysfunction Neurologic Complaints and Symptoms: No Complaints or Symptoms Medical History: Past Medical History Notes: Cervical disc myelopathy, thoracic degenerative disease Oncologic Complaints and Symptoms: No Complaints or Symptoms Medical History: Past Medical History Notes: Myelomonocytic leukemia Psychiatric YSRAEL, BOGHOSIAN ([id number]) Complaints and Symptoms: No Complaints or Symptoms Medical History: Negative for: Anorexia/bulimia; Confinement Anxiety HBO Extended History Items Eyes: Cataracts Family and Social History Never smoker; Marital Status - Married; Alcohol Use: Rarely; Drug Use: No History; Caffeine Use: Rarely; Advanced Directives: No; Living Will: No Electronic Signature(s) Signed: 02/25/2016 4:29:49 PM By: Linton Ham MD Signed: 02/26/2016 3:37:03 PM By: Regan Lemming BSN, RN Entered By: Regan Lemming on 02/25/2016 09:57:13 Nathan Cunningham ([id number]) -------------------------------------------------------------------------------- SuperBill Details Nathan Cunningham Date of Service: 02/25/2016 Patient Name: E. Patient Account Number: [id number] Medical Record Treating RN: Baruch Gouty RN, BSN, Velva Harman [id number] Number: Other Clinician: Date of Birth/Sex: [birth year] (82 y.o. Male) Treating Linton Ham Primary Care Physician/Extender: Spero Curb, Tanja Port Physician: Suella Grove in Treatment: 0 Referring Physician: Arnette Norris Diagnosis Coding ICD-10 Codes Code Description L89.150 Pressure ulcer of sacral region, unstageable E11.42 Type 2 diabetes mellitus with diabetic polyneuropathy R29.6 Repeated falls Facility Procedures CPT4 Code: [id number] Description: [facility] VISIT-LEV 3 EST PT Modifier: Quantity: 1 CPT4 Code: [id number] Description: [id number] - DEB SUBQ TISSUE 20  SQ CM/< ICD-10 Description Diagnosis L89.150 Pressure ulcer of sacral region, unstageable Modifier: Quantity: 1 Physician Procedures CPT4 [id number] Description: WC PHYS LEVEL 3 o NEW PT ICD-10 Description Diagnosis L89.150 Pressure ulcer of sacral region, unstageable Modifier: Quantity: 1 CPT4 Code: [id number] Description: 11042 - WC PHYS SUBQ TISS 20 SQ CM ICD-10 Description Diagnosis L89.150 Pressure ulcer of sacral region, unstageable Modifier: Quantity: 1 Electronic Signature(s) Signed: 02/25/2016 4:29:49 PM By: Linton Ham MD Entered By: Linton Ham on 02/25/2016 12:43:11

## 2016-02-27 NOTE — Progress Notes (Signed)
JAGAR, WALLES ([id number]) Visit Report for 02/25/2016 Abuse/Suicide Risk Screen Details Gwenlyn Perking Date of Service: 02/25/2016 9:45 AM Patient Name: E. Patient Account Number: [id number] Medical Record Treating RN: Baruch Gouty RN, BSN, Velva Harman [id number] Number: Other Clinician: Date of Birth/Sex: Jan 14, 1925 (82 y.o. Male) Treating Linton Ham Primary Care Physician/Extender: Spero Curb, Tanja Port Physician: Referring Physician: Morene Crocker in Treatment: 0 Abuse/Suicide Risk Screen Items Answer ABUSE/SUICIDE RISK SCREEN: Has anyone close to you tried to hurt or harm you recentlyo No Do you feel uncomfortable with anyone in your familyo No Has anyone forced you do things that you didnot want to doo No Do you have any thoughts of harming yourselfo No Patient displays signs or symptoms of abuse and/or neglect. No Electronic Signature(s) Signed: 02/26/2016 3:37:03 PM By: Regan Lemming BSN, RN Entered By: Regan Lemming on 02/25/2016 09:48:06 Stevphen Rochester ([id number]) -------------------------------------------------------------------------------- Activities of Daily Living Details Gwenlyn Perking Date of Service: 02/25/2016 9:45 AM Patient Name: E. Patient Account Number: [id number] Medical Record Treating RN: Baruch Gouty, RN, BSN, Velva Harman [id number] Number: Other Clinician: Date of Birth/Sex: [birth year] (82 y.o. Male) Treating Linton Ham Primary Care Physician/Extender: Spero Curb, Tanja Port Physician: Referring Physician: Morene Crocker in Treatment: 0 Activities of Daily Living Items Answer Activities of Daily Living (Please select one for each item) Drive Automobile Completely Able Take Medications Completely Able Use Telephone Completely Able Care for Appearance Completely Able Use Toilet Completely Able Bath / Shower Completely Able Dress Self Completely Able Feed Self Completely Able Walk Need Assistance Get In / Out Bed Completely Able Housework Completely  Able Prepare Meals Completely Micro for Self Completely Able Electronic Signature(s) Signed: 02/26/2016 3:37:03 PM By: Regan Lemming BSN, RN Entered By: Regan Lemming on 02/25/2016 09:48:26 Stevphen Rochester ([id number]) -------------------------------------------------------------------------------- Education Assessment Details Gwenlyn Perking Date of Service: 02/25/2016 9:45 AM Patient Name: E. Patient Account Number: [id number] Medical Record Treating RN: Baruch Gouty, RN, BSN, Velva Harman [id number] Number: Other Clinician: Date of Birth/Sex: [birth year] (82 y.o. Male) Treating Linton Ham Primary Care Physician/Extender: Spero Curb, Tanja Port Physician: Referring Physician: Morene Crocker in Treatment: 0 Primary Learner Assessed: Caregiver wife Learning Preferences/Education Level/Primary Language Learning Preference: Explanation Highest Education Level: College or Above Preferred Language: English Cognitive Barrier Assessment/Beliefs Language Barrier: No Physical Barrier Assessment Impaired Vision: Yes Glasses Impaired Hearing: No Decreased Hand dexterity: No Knowledge/Comprehension Assessment Knowledge Level: High Comprehension Level: High Ability to understand written High instructions: Ability to understand verbal High instructions: Motivation Assessment Anxiety Level: Calm Cooperation: Cooperative Education Importance: Acknowledges Need Interest in Health Problems: Asks Questions Perception: Coherent Willingness to Engage in Self- High Management Activities: Readiness to Engage in Self- High Management Activities: Electronic Signature(s) Signed: 02/26/2016 3:37:03 PM By: Regan Lemming BSN, RN Stevphen Rochester ([id number]) Entered By: Regan Lemming on 02/25/2016 09:49:16 Stevphen Rochester ([id number]) -------------------------------------------------------------------------------- Fall Risk Assessment Details Gwenlyn Perking  Date of Service: 02/25/2016 9:45 AM Patient Name: E. Patient Account Number: [id number] Medical Record Treating RN: Baruch Gouty RN, BSN, Velva Harman [id number] Number: Other Clinician: Date of Birth/Sex: May 10, 1925 (82 y.o. Male) Treating ROBSON, MICHAEL Primary Care Physician/Extender: Spero Curb, Tanja Port Physician: Referring Physician: Morene Crocker in Treatment: 0 Fall Risk Assessment Items Have you had 2 or more falls in the last 12 monthso 0 Yes Have you had any fall that resulted in injury in the last 12 monthso 0 Yes FALL RISK ASSESSMENT: History of falling - immediate or within 3 months 25 Yes Secondary diagnosis 0 No Ambulatory aid None/bed rest/wheelchair/nurse 0 Yes  Crutches/cane/walker 15 Yes Furniture 0 No IV Access/Saline Lock 0 No Gait/Training Normal/bed rest/immobile 0 No Weak 10 Yes Impaired 20 Yes Mental Status Oriented to own ability 0 Yes Electronic Signature(s) Signed: 02/26/2016 3:37:03 PM By: Regan Lemming BSN, RN Entered By: Regan Lemming on 02/25/2016 09:50:12 Stevphen Rochester ([id number]) -------------------------------------------------------------------------------- Foot Assessment Details Gwenlyn Perking Date of Service: 02/25/2016 9:45 AM Patient Name: E. Patient Account Number: [id number] Medical Record Treating RN: Baruch Gouty, RN, BSN, Velva Harman [id number] Number: Other Clinician: Date of Birth/Sex: [birth year] (82 y.o. Male) Treating Linton Ham Primary Care Physician/Extender: Spero Curb, Tanja Port Physician: Referring Physician: Morene Crocker in Treatment: 0 Foot Assessment Items Site Locations + = Sensation present, - = Sensation absent, C = Callus, U = Ulcer R = Redness, W = Warmth, M = Maceration, PU = Pre-ulcerative lesion F = Fissure, S = Swelling, D = Dryness Assessment Right: Left: Other Deformity: No No Prior Foot Ulcer: No No Prior Amputation: No No Charcot Joint: No No Ambulatory Status: Ambulatory With Help Assistance Device:  Wheelchair Gait: Buyer, retail Signature(s) Signed: 02/26/2016 3:37:03 PM By: Regan Lemming BSN, RN Stevphen Rochester ([id number]) Entered By: Regan Lemming on 02/25/2016 09:50:38 Stevphen Rochester ([id number]) -------------------------------------------------------------------------------- Nutrition Risk Assessment Details Gwenlyn Perking Date of Service: 02/25/2016 9:45 AM Patient Name: E. Patient Account Number: [id number] Medical Record Treating RN: Baruch Gouty RN, BSN, Velva Harman [id number] Number: Other Clinician: Date of Birth/Sex: [birth year] (82 y.o. Male) Treating Linton Ham Primary Care Physician/Extender: Spero Curb, Tanja Port Physician: Referring Physician: Arnette Norris Weeks in Treatment: 0 Height (in): Weight (lbs): Body Mass Index (BMI): Nutrition Risk Assessment Items NUTRITION RISK SCREEN: I have an illness or condition that made me change the kind and/or 0 No amount of food I eat I eat fewer than two meals per day 0 No I eat few fruits and vegetables, or milk products 0 No I have three or more drinks of beer, liquor or wine almost every day 0 No I have tooth or mouth problems that make it hard for me to eat 0 No I don't always have enough money to buy the food I need 0 No I eat alone most of the time 0 No I take three or more different prescribed or over-the-counter drugs a 0 No day Without wanting to, I have lost or gained 10 pounds in the last six 0 No months I am not always physically able to shop, cook and/or feed myself 0 No Nutrition Protocols Good Risk Protocol 0 No interventions needed Moderate Risk Protocol Electronic Signature(s) Signed: 02/26/2016 3:37:03 PM By: Regan Lemming BSN, RN Entered By: Regan Lemming on 02/25/2016 09:50:22

## 2016-03-03 ENCOUNTER — Emergency Department (HOSPITAL_COMMUNITY): Payer: Medicare Other

## 2016-03-03 ENCOUNTER — Encounter (HOSPITAL_COMMUNITY): Payer: Self-pay | Admitting: Vascular Surgery

## 2016-03-03 ENCOUNTER — Other Ambulatory Visit (HOSPITAL_COMMUNITY): Payer: Medicare Other

## 2016-03-03 ENCOUNTER — Inpatient Hospital Stay (HOSPITAL_COMMUNITY)
Admission: EM | Admit: 2016-03-03 | Discharge: 2016-03-08 | DRG: 193 | Disposition: A | Discharge status: Skilled Nursing Facility | Payer: Medicare Other | Attending: Internal Medicine | Admitting: Internal Medicine

## 2016-03-03 ENCOUNTER — Ambulatory Visit: Payer: Medicare Other | Admitting: Internal Medicine

## 2016-03-03 DIAGNOSIS — J9601 Acute respiratory failure with hypoxia: Secondary | ICD-10-CM | POA: Diagnosis present

## 2016-03-03 DIAGNOSIS — I10 Essential (primary) hypertension: Secondary | ICD-10-CM | POA: Diagnosis present

## 2016-03-03 DIAGNOSIS — R296 Repeated falls: Secondary | ICD-10-CM | POA: Diagnosis present

## 2016-03-03 DIAGNOSIS — R0600 Dyspnea, unspecified: Secondary | ICD-10-CM | POA: Diagnosis not present

## 2016-03-03 DIAGNOSIS — Z981 Arthrodesis status: Secondary | ICD-10-CM

## 2016-03-03 DIAGNOSIS — I4892 Unspecified atrial flutter: Secondary | ICD-10-CM | POA: Diagnosis not present

## 2016-03-03 DIAGNOSIS — J690 Pneumonitis due to inhalation of food and vomit: Secondary | ICD-10-CM

## 2016-03-03 DIAGNOSIS — J189 Pneumonia, unspecified organism: Secondary | ICD-10-CM | POA: Diagnosis not present

## 2016-03-03 DIAGNOSIS — M5000 Cervical disc disorder with myelopathy, unspecified cervical region: Secondary | ICD-10-CM | POA: Diagnosis present

## 2016-03-03 DIAGNOSIS — S199XXA Unspecified injury of neck, initial encounter: Secondary | ICD-10-CM | POA: Diagnosis not present

## 2016-03-03 DIAGNOSIS — Z9181 History of falling: Secondary | ICD-10-CM

## 2016-03-03 DIAGNOSIS — R918 Other nonspecific abnormal finding of lung field: Secondary | ICD-10-CM | POA: Diagnosis not present

## 2016-03-03 DIAGNOSIS — S0990XA Unspecified injury of head, initial encounter: Secondary | ICD-10-CM | POA: Diagnosis present

## 2016-03-03 DIAGNOSIS — R5381 Other malaise: Secondary | ICD-10-CM | POA: Diagnosis present

## 2016-03-03 DIAGNOSIS — K649 Unspecified hemorrhoids: Secondary | ICD-10-CM | POA: Diagnosis present

## 2016-03-03 DIAGNOSIS — L89302 Pressure ulcer of unspecified buttock, stage 2: Secondary | ICD-10-CM | POA: Diagnosis not present

## 2016-03-03 DIAGNOSIS — M79632 Pain in left forearm: Secondary | ICD-10-CM | POA: Diagnosis not present

## 2016-03-03 DIAGNOSIS — K5909 Other constipation: Secondary | ICD-10-CM | POA: Diagnosis present

## 2016-03-03 DIAGNOSIS — R0603 Acute respiratory distress: Secondary | ICD-10-CM | POA: Diagnosis present

## 2016-03-03 DIAGNOSIS — S51812A Laceration without foreign body of left forearm, initial encounter: Secondary | ICD-10-CM | POA: Diagnosis present

## 2016-03-03 DIAGNOSIS — Z7982 Long term (current) use of aspirin: Secondary | ICD-10-CM

## 2016-03-03 DIAGNOSIS — I119 Hypertensive heart disease without heart failure: Secondary | ICD-10-CM | POA: Diagnosis present

## 2016-03-03 DIAGNOSIS — Z7984 Long term (current) use of oral hypoglycemic drugs: Secondary | ICD-10-CM

## 2016-03-03 DIAGNOSIS — B9620 Unspecified Escherichia coli [E. coli] as the cause of diseases classified elsewhere: Secondary | ICD-10-CM | POA: Diagnosis present

## 2016-03-03 DIAGNOSIS — S59912A Unspecified injury of left forearm, initial encounter: Secondary | ICD-10-CM | POA: Diagnosis not present

## 2016-03-03 DIAGNOSIS — C9250 Acute myelomonocytic leukemia, not having achieved remission: Secondary | ICD-10-CM | POA: Diagnosis present

## 2016-03-03 DIAGNOSIS — D696 Thrombocytopenia, unspecified: Secondary | ICD-10-CM | POA: Diagnosis not present

## 2016-03-03 DIAGNOSIS — D649 Anemia, unspecified: Secondary | ICD-10-CM | POA: Diagnosis present

## 2016-03-03 DIAGNOSIS — C9390 Monocytic leukemia, unspecified, not having achieved remission: Secondary | ICD-10-CM | POA: Diagnosis not present

## 2016-03-03 DIAGNOSIS — E118 Type 2 diabetes mellitus with unspecified complications: Secondary | ICD-10-CM

## 2016-03-03 DIAGNOSIS — W0110XA Fall on same level from slipping, tripping and stumbling with subsequent striking against unspecified object, initial encounter: Secondary | ICD-10-CM | POA: Diagnosis present

## 2016-03-03 DIAGNOSIS — E785 Hyperlipidemia, unspecified: Secondary | ICD-10-CM | POA: Diagnosis present

## 2016-03-03 DIAGNOSIS — Z95810 Presence of automatic (implantable) cardiac defibrillator: Secondary | ICD-10-CM

## 2016-03-03 DIAGNOSIS — J90 Pleural effusion, not elsewhere classified: Secondary | ICD-10-CM | POA: Diagnosis not present

## 2016-03-03 DIAGNOSIS — R531 Weakness: Secondary | ICD-10-CM | POA: Diagnosis not present

## 2016-03-03 DIAGNOSIS — Z9842 Cataract extraction status, left eye: Secondary | ICD-10-CM

## 2016-03-03 DIAGNOSIS — D638 Anemia in other chronic diseases classified elsewhere: Secondary | ICD-10-CM | POA: Diagnosis present

## 2016-03-03 DIAGNOSIS — K5900 Constipation, unspecified: Secondary | ICD-10-CM | POA: Diagnosis present

## 2016-03-03 DIAGNOSIS — N390 Urinary tract infection, site not specified: Secondary | ICD-10-CM | POA: Diagnosis not present

## 2016-03-03 DIAGNOSIS — G4730 Sleep apnea, unspecified: Secondary | ICD-10-CM | POA: Diagnosis present

## 2016-03-03 DIAGNOSIS — R404 Transient alteration of awareness: Secondary | ICD-10-CM | POA: Diagnosis not present

## 2016-03-03 DIAGNOSIS — E119 Type 2 diabetes mellitus without complications: Secondary | ICD-10-CM | POA: Diagnosis present

## 2016-03-03 HISTORY — DX: Obstructive sleep apnea (adult) (pediatric): G47.33

## 2016-03-03 HISTORY — DX: Anemia, unspecified: D64.9

## 2016-03-03 HISTORY — DX: Obstructive sleep apnea (adult) (pediatric): Z99.89

## 2016-03-03 HISTORY — DX: Type 2 diabetes mellitus without complications: E11.9

## 2016-03-03 LAB — COMPREHENSIVE METABOLIC PANEL
ALBUMIN: 2.8 g/dL — AB (ref 3.5–5.0)
ALT: 13 U/L — AB (ref 17–63)
AST: 12 U/L — AB (ref 15–41)
Alkaline Phosphatase: 51 U/L (ref 38–126)
Anion gap: 5 (ref 5–15)
BILIRUBIN TOTAL: 0.6 mg/dL (ref 0.3–1.2)
BUN: 16 mg/dL (ref 6–20)
CHLORIDE: 100 mmol/L — AB (ref 101–111)
CO2: 29 mmol/L (ref 22–32)
CREATININE: 0.69 mg/dL (ref 0.61–1.24)
Calcium: 8.8 mg/dL — ABNORMAL LOW (ref 8.9–10.3)
GFR calc Af Amer: >60 mL/min (ref >60)
GLUCOSE: 148 mg/dL — AB (ref 65–99)
Potassium: 4.5 mmol/L (ref 3.5–5.1)
Sodium: 134 mmol/L — ABNORMAL LOW (ref 135–145)
Total Protein: 6.5 g/dL (ref 6.5–8.1)

## 2016-03-03 LAB — CBG MONITORING, ED: GLUCOSE-CAPILLARY: 143 mg/dL — AB (ref 65–99)

## 2016-03-03 LAB — URINALYSIS, ROUTINE W REFLEX MICROSCOPIC
BILIRUBIN URINE: NEGATIVE
Glucose, UA: NEGATIVE mg/dL
Ketones, ur: NEGATIVE mg/dL
NITRITE: POSITIVE — AB
PH: 6 (ref 5.0–8.0)
Protein, ur: NEGATIVE mg/dL
SPECIFIC GRAVITY, URINE: 1.015 (ref 1.005–1.030)

## 2016-03-03 LAB — CBC WITH DIFFERENTIAL/PLATELET
BASOS ABS: 0 10*3/uL (ref 0.0–0.1)
Basophils Relative: 0 %
EOS PCT: 0 %
Eosinophils Absolute: 0 10*3/uL (ref 0.0–0.7)
HEMATOCRIT: 33.4 % — AB (ref 39.0–52.0)
Hemoglobin: 10.9 g/dL — ABNORMAL LOW (ref 13.0–17.0)
LYMPHS ABS: 1.5 10*3/uL (ref 0.7–4.0)
LYMPHS PCT: 18 %
MCH: 30.5 pg (ref 26.0–34.0)
MCHC: 32.6 g/dL (ref 30.0–36.0)
MCV: 93.6 fL (ref 78.0–100.0)
MONO ABS: 2.9 10*3/uL — AB (ref 0.1–1.0)
MONOS PCT: 35 %
Neutro Abs: 3.8 10*3/uL (ref 1.7–7.7)
Neutrophils Relative %: 46 %
PLATELETS: 117 10*3/uL — AB (ref 150–400)
RBC: 3.57 MIL/uL — AB (ref 4.22–5.81)
RDW: 14.7 % (ref 11.5–15.5)
WBC: 8.2 10*3/uL (ref 4.0–10.5)

## 2016-03-03 LAB — RETICULOCYTES
RBC.: 3.71 MIL/uL — AB (ref 4.22–5.81)
RETIC CT PCT: 1.9 % (ref 0.4–3.1)
Retic Count, Absolute: 70.5 10*3/uL (ref 19.0–186.0)

## 2016-03-03 LAB — BRAIN NATRIURETIC PEPTIDE: B NATRIURETIC PEPTIDE 5: 34.5 pg/mL (ref 0.0–100.0)

## 2016-03-03 LAB — URINE MICROSCOPIC-ADD ON

## 2016-03-03 LAB — I-STAT CG4 LACTIC ACID, ED: Lactic Acid, Venous: 0.71 mmol/L (ref 0.5–1.9)

## 2016-03-03 LAB — IRON AND TIBC
Iron: 68 ug/dL (ref 45–182)
SATURATION RATIOS: 25 % (ref 17.9–39.5)
TIBC: 270 ug/dL (ref 250–450)
UIBC: 202 ug/dL

## 2016-03-03 LAB — I-STAT TROPONIN, ED: TROPONIN I, POC: 0 ng/mL (ref 0.00–0.08)

## 2016-03-03 LAB — GLUCOSE, CAPILLARY
Glucose-Capillary: 97 mg/dL (ref 65–99)
Glucose-Capillary: 142 mg/dL — ABNORMAL HIGH (ref 65–99)

## 2016-03-03 LAB — FERRITIN: Ferritin: 80 ng/mL (ref 24–336)

## 2016-03-03 LAB — CK: Total CK: 81 U/L (ref 49–397)

## 2016-03-03 LAB — VITAMIN B12: Vitamin B-12: 360 pg/mL (ref 180–914)

## 2016-03-03 LAB — FOLATE: Folate: 32.9 ng/mL (ref >5.9)

## 2016-03-03 MED ORDER — POTASSIUM GLUCONATE 595 MG PO CAPS: 1.0000 | ORAL_CAPSULE | Freq: Every day | ORAL | Status: DC

## 2016-03-03 MED ORDER — DORZOLAMIDE HCL-TIMOLOL MAL 2-0.5 % OP SOLN
1.0000 [drp] | Freq: Two times a day (BID) | OPHTHALMIC | Status: DC
  Administered 2016-03-03 – 2016-03-08 (×10): 1 [drp] via OPHTHALMIC
  Filled 2016-03-03 (×2): qty 10

## 2016-03-03 MED ORDER — OXYCODONE-ACETAMINOPHEN 5-325 MG PO TABS
1.0000 | ORAL_TABLET | Freq: Four times a day (QID) | ORAL | Status: DC | PRN
Start: 2016-03-03 — End: 2016-03-08
  Administered 2016-03-03: 2 via ORAL
  Filled 2016-03-03: qty 2

## 2016-03-03 MED ORDER — COLLAGENASE 250 UNIT/GM EX OINT
TOPICAL_OINTMENT | Freq: Every day | CUTANEOUS | Status: DC
  Administered 2016-03-04 – 2016-03-08 (×5): via TOPICAL
  Filled 2016-03-03: qty 30

## 2016-03-03 MED ORDER — MILK AND MOLASSES ENEMA
1.0000 | Freq: Once | RECTAL | Status: AC
  Administered 2016-03-04: 250 mL via RECTAL
  Filled 2016-03-03: qty 250

## 2016-03-03 MED ORDER — INSULIN ASPART 100 UNIT/ML ~~LOC~~ SOLN
0.0000 [IU] | Freq: Three times a day (TID) | SUBCUTANEOUS | Status: DC
  Administered 2016-03-03 – 2016-03-08 (×11): 2 [IU] via SUBCUTANEOUS
  Filled 2016-03-03: qty 1

## 2016-03-03 MED ORDER — SODIUM CHLORIDE 0.9 % IV BOLUS (SEPSIS)
500.0000 mL | Freq: Once | INTRAVENOUS | Status: AC
  Administered 2016-03-03: 500 mL via INTRAVENOUS

## 2016-03-03 MED ORDER — SODIUM CHLORIDE 0.9 % IV SOLN
3.0000 g | Freq: Four times a day (QID) | INTRAVENOUS | Status: DC
  Administered 2016-03-03 – 2016-03-08 (×21): 3 g via INTRAVENOUS
  Filled 2016-03-03 (×27): qty 3

## 2016-03-03 MED ORDER — ASPIRIN 325 MG PO TABS
162.0000 mg | ORAL_TABLET | Freq: Every day | ORAL | Status: DC
  Administered 2016-03-04 – 2016-03-08 (×5): 162 mg via ORAL
  Filled 2016-03-03 (×5): qty 1

## 2016-03-03 MED ORDER — ACETAMINOPHEN 325 MG PO TABS: 650.0000 mg | ORAL_TABLET | Freq: Four times a day (QID) | ORAL | Status: DC | PRN

## 2016-03-03 MED ORDER — CEFTRIAXONE SODIUM 1 G IJ SOLR: 1.0000 g | INTRAMUSCULAR | Status: DC

## 2016-03-03 MED ORDER — ALBUTEROL SULFATE (2.5 MG/3ML) 0.083% IN NEBU
2.5000 mg | INHALATION_SOLUTION | Freq: Four times a day (QID) | RESPIRATORY_TRACT | Status: DC | PRN
  Filled 2016-03-03 (×2): qty 3

## 2016-03-03 MED ORDER — ALBUTEROL SULFATE (2.5 MG/3ML) 0.083% IN NEBU
2.5000 mg | INHALATION_SOLUTION | Freq: Four times a day (QID) | RESPIRATORY_TRACT | Status: DC
  Filled 2016-03-03: qty 3

## 2016-03-03 MED ORDER — SODIUM CHLORIDE 0.9 % IV SOLN: INTRAVENOUS | Status: DC

## 2016-03-03 MED ORDER — MAGNESIUM HYDROXIDE 400 MG/5ML PO SUSP
30.0000 mL | Freq: Every day | ORAL | Status: AC
  Administered 2016-03-04: 30 mL via ORAL
  Filled 2016-03-03: qty 30

## 2016-03-03 MED ORDER — INSULIN ASPART 100 UNIT/ML ~~LOC~~ SOLN: 0.0000 [IU] | Freq: Every day | SUBCUTANEOUS | Status: DC

## 2016-03-03 MED ORDER — CHROMIUM PICOLINATE 400 MCG PO TABS: 1.0000 | ORAL_TABLET | Freq: Every day | ORAL | Status: DC

## 2016-03-03 MED ORDER — DOCUSATE SODIUM 100 MG PO CAPS: 100.0000 mg | ORAL_CAPSULE | Freq: Two times a day (BID) | ORAL | Status: DC | PRN

## 2016-03-03 MED ORDER — ONDANSETRON HCL 4 MG/2ML IJ SOLN: 4.0000 mg | Freq: Four times a day (QID) | INTRAMUSCULAR | Status: DC | PRN

## 2016-03-03 MED ORDER — AMITRIPTYLINE HCL 50 MG PO TABS
75.0000 mg | ORAL_TABLET | Freq: Every day | ORAL | Status: DC
  Administered 2016-03-03 – 2016-03-07 (×5): 75 mg via ORAL
  Filled 2016-03-03 (×3): qty 2
  Filled 2016-03-03 (×2): qty 1.5
  Filled 2016-03-03 (×2): qty 2

## 2016-03-03 MED ORDER — ENOXAPARIN SODIUM 40 MG/0.4ML ~~LOC~~ SOLN
40.0000 mg | SUBCUTANEOUS | Status: DC
  Administered 2016-03-03 – 2016-03-07 (×5): 40 mg via SUBCUTANEOUS
  Filled 2016-03-03 (×5): qty 0.4

## 2016-03-03 MED ORDER — DEXTROSE 5 % IV SOLN: 1.0000 g | INTRAVENOUS | Status: DC

## 2016-03-03 MED ORDER — AZITHROMYCIN 250 MG PO TABS: 500.0000 mg | ORAL_TABLET | ORAL | Status: DC

## 2016-03-03 MED ORDER — ONDANSETRON HCL 4 MG PO TABS: 4.0000 mg | ORAL_TABLET | Freq: Four times a day (QID) | ORAL | Status: DC | PRN

## 2016-03-03 MED ORDER — IRBESARTAN 150 MG PO TABS
150.0000 mg | ORAL_TABLET | Freq: Every day | ORAL | Status: DC
  Administered 2016-03-04 – 2016-03-08 (×5): 150 mg via ORAL
  Filled 2016-03-03 (×5): qty 1

## 2016-03-03 MED ORDER — DOCUSATE SODIUM 100 MG PO CAPS
100.0000 mg | ORAL_CAPSULE | Freq: Two times a day (BID) | ORAL | Status: DC
  Administered 2016-03-03 – 2016-03-08 (×8): 100 mg via ORAL
  Filled 2016-03-03 (×10): qty 1

## 2016-03-03 MED ORDER — ACETAMINOPHEN 650 MG RE SUPP: 650.0000 mg | Freq: Four times a day (QID) | RECTAL | Status: DC | PRN

## 2016-03-03 MED ORDER — DEXTROSE 5 % IV SOLN
500.0000 mg | Freq: Once | INTRAVENOUS | Status: DC
  Administered 2016-03-03: 500 mg via INTRAVENOUS
  Filled 2016-03-03: qty 500

## 2016-03-03 MED ORDER — DEXTROSE 5 % IV SOLN
1.0000 g | Freq: Once | INTRAVENOUS | Status: DC
  Administered 2016-03-03: 1 g via INTRAVENOUS
  Filled 2016-03-03: qty 10

## 2016-03-03 MED ORDER — SODIUM CHLORIDE 0.9 % IV SOLN
INTRAVENOUS | Status: AC
  Administered 2016-03-03: 13:00:00 via INTRAVENOUS

## 2016-03-03 NOTE — H&P (Signed)
History and Physical    Nathan Cunningham:[id number] DOB: 1925/06/05 DOA: 03/03/2016  PCP: Arnette Norris, MD Patient coming from: home  Chief Complaint: falls/generalized weakness  HPI: Nathan Cunningham is a delightful 82 y.o. male with medical history significant for diabetes, hypertension, macular degeneration, diverticulosis, HCC, chronic constipation frequent falls presents to the emergency Department chief complaint of fall and generalized weakness. Initial evaluation concerning for community-acquired pneumonia.  Information is obtained from the patient. He reports a history of frequent falls and reports he fell yesterday evening. He uses a walker and reports his legs just "gave out". Last night he awakened during the night while going to the bathroom he states he fell again. This point he hit his head. He denies any loss of consciousness. He was too weak to get up on the floor all night. Associated symptoms include worsening chronic nonproductive cough and worsening weakness. He denies headache visual disturbances fever chills. He denies chest pain palpitations lower extremity edema. He has reported some chronic constipation and rectal pain. He did have a bowel movement yesterday. He notes some blood on the toilet paper. He denies dysuria hematuria frequency or urgency.    ED Course: Emergency department he is afebrile hemodynamically stable oxygen saturation level drops to 88% with exertion (repositioning in bed), nontoxic appearing. He received antibiotics  Review of Systems: As per HPI otherwise 10 point review of systems negative.   Ambulatory Status: He ambulates with a walker. He experiences frequent falls  Past Medical History:  Diagnosis Date  . Adenomatous polyp of colon 03/2003  . Anemia   . Constipation   . Diabetes mellitus   . Diverticulosis   . Dizziness   . ETOH abuse    binge drinking  . Hemorrhoids   . Hyperlipidemia   . Hypertension   . Macular  degeneration    Right eye  . Sleep apnea    on CPAP    Past Surgical History:  Procedure Laterality Date  . APPENDECTOMY     age 48  . CARDIAC DEFIBRILLATOR PLACEMENT    . CATARACT EXTRACTION     Left eye  . cevical fusion    . INGUINAL HERNIA REPAIR     age 77  . TONSILLECTOMY      Social History   Social History  . Marital status: Unknown    Spouse name: N/A  . Number of children: 2  . Years of education: N/A   Occupational History  . retired    Social History Main Topics  . Smoking status: Never Smoker  . Smokeless tobacco: Never Used  . Alcohol use 7.0 oz/week    14 Standard drinks or equivalent per week  . Drug use: No  . Sexual activity: No   Other Topics Concern  . Not on file   Social History Narrative   Retired from [employer]   Goes to [place] sneakers 3 times a week.   Has a girlfriend.   Does not have a living will yet.   Desires CPR, does not prolonged life support if futile.          No Known Allergies  Family History  Problem Relation Age of Onset  . Heart disease Father   . Stroke Father 28    Prior to Admission medications   Medication Sig Start Date End Date Taking? Authorizing Provider  amitriptyline (ELAVIL) 75 MG tablet TAKE 1 TABLET BY MOUTH AT BEDTIME 09/10/15   Lucille Passy, MD  aspirin 325 MG tablet Take 162 mg by mouth daily.    Historical Provider, MD  Blood Glucose Monitoring Suppl (ONE TOUCH ULTRA SYSTEM KIT) W/DEVICE KIT 1 kit by Does not apply route once. 12/26/14   Lucille Passy, MD  Chromium Picolinate (RA CHROMIUM PICOLINATE) 400 MCG TABS Take 1 tablet by mouth daily.      Historical Provider, MD  cyanocobalamin (,VITAMIN B-12,) 1000 MCG/ML injection Inject 1063mg every 60days IM (every other month) 03/21/14   TLucille Passy MD  docusate sodium (COLACE) 100 MG capsule Take 100 mg by mouth 3 (three) times daily as needed.      Historical Provider, MD  dorzolamide-timolol (COSOPT) 22.3-6.8 MG/ML ophthalmic solution 1 drop  2 (two) times daily.      Historical Provider, MD  fish oil-omega-3 fatty acids 1000 MG capsule Take by mouth. Take six daily    Historical Provider, MD  glipiZIDE (GLUCOTROL XL) 5 MG 24 hr tablet Take 1 tablet (5 mg total) by mouth daily. 01/22/16   TLucille Passy MD  glucose blood (ONE TOUCH ULTRA TEST) test strip USE AS DIRECTED TO TEST BLOOD SUGAR ONCE DAILY 06/06/15   TLucille Passy MD  Lancets ([facility]) lancets Use as instructed 12/26/14   TLucille Passy MD  Magnesium-Calcium-Folic Acid (MAGNEBIND 4237 400-200-1 MG TABS Take 1 tablet by mouth daily.      Historical Provider, MD  Melatonin 5 MG CAPS Take 1 each by mouth daily.      Historical Provider, MD  Multiple Vitamins-Minerals (EYE-VITES) TABS Take 1 tablet by mouth 2 (two) times daily.      Historical Provider, MD  Potassium Gluconate (K-99) 595 MG CAPS Take 1 each by mouth daily.      Historical Provider, MD  Saxagliptin-Metformin (KOMBIGLYZE XR) 12-998 MG TB24 Take 1 each by mouth daily. 09/09/15   TLucille Passy MD  sitaGLIPtin (JANUVIA) 100 MG tablet Take 1 tablet (100 mg total) by mouth daily. 10/02/15   TLucille Passy MD  telmisartan (MICARDIS) 80 MG tablet TAKE 1 TABLET DAILY 09/09/15   TLucille Passy MD  Triamcinolone Acetonide (TRIAMCINOLONE 0.1 % CREAM : EUCERIN) CREA Apply 1 application topically 3 (three) times daily as needed. 02/05/16   TLucille Passy MD    Physical Exam: Vitals:   03/03/16 0839 03/03/16 0900 03/03/16 1002 03/03/16 1030  BP: 120/58 (!) 114/53 115/71 128/68  Pulse: 77 79 77 79  Resp: 16 24 14 22   Temp: 97.6 F (36.4 C)  97.7 F (36.5 C)   TempSrc: Oral  Oral   SpO2: 90% 93% 91% 95%     General:  Appears calm and comfortable, somewhat pale and fragile appearing Eyes:  PERRL, EOMI, normal lids, iris ENT:  grossly normal hearing, lips & tongue, he does membranes of his mouth slightly pale somewhat dry Neck:  no LAD, masses or thyromegaly Cardiovascular:  RRR, no m/r/g. Trace to 1+ pitting LE  edema.  Respiratory:  Mild increased work of breathing with exertion. Breath sounds diminished throughout particularly in the bases. I hear no crackles no wheezes Abdomen:  Slightly firm sluggish bowel sounds nontender to palpation no guarding or rebounding Skin:  no rash or induration seen on limited exam, left forearm with skin tears, 1 cm sacral with erythema extending Musculoskeletal:  grossly normal tone BUE/BLE, good ROM, no bony abnormality, joints without swelling/erythema Psychiatric:  grossly normal mood and affect, speech fluent and appropriate, AOx3 Neurologic:  CN 2-12 grossly intact,  moves all extremities in coordinated fashion, sensation intact  Labs on Admission: I have personally reviewed following labs and imaging studies  CBC:  Recent Labs Lab 03/03/16 0909  WBC 8.2  NEUTROABS 3.8  HGB 10.9*  HCT 33.4*  MCV 93.6  PLT 034*   Basic Metabolic Panel:  Recent Labs Lab 03/03/16 0909  NA 134*  K 4.5  CL 100*  CO2 29  GLUCOSE 148*  BUN 16  CREATININE 0.69  CALCIUM 8.8*   GFR: Estimated Creatinine Clearance: 70.1 mL/min (by C-G formula based on SCr of 0.8 mg/dL). Liver Function Tests:  Recent Labs Lab 03/03/16 0909  AST 12*  ALT 13*  ALKPHOS 51  BILITOT 0.6  PROT 6.5  ALBUMIN 2.8*   No results for input(s): LIPASE, AMYLASE in the last 168 hours. No results for input(s): AMMONIA in the last 168 hours. Coagulation Profile: No results for input(s): INR, PROTIME in the last 168 hours. Cardiac Enzymes:  Recent Labs Lab 03/03/16 0909  CKTOTAL 81   BNP (last 3 results) No results for input(s): PROBNP in the last 8760 hours. HbA1C: No results for input(s): HGBA1C in the last 72 hours. CBG: No results for input(s): GLUCAP in the last 168 hours. Lipid Profile: No results for input(s): CHOL, HDL, LDLCALC, TRIG, CHOLHDL, LDLDIRECT in the last 72 hours. Thyroid Function Tests: No results for input(s): TSH, T4TOTAL, FREET4, T3FREE, THYROIDAB in the  last 72 hours. Anemia Panel: No results for input(s): VITAMINB12, FOLATE, FERRITIN, TIBC, IRON, RETICCTPCT in the last 72 hours. Urine analysis:    Component Value Date/Time   COLORURINE YELLOW 03/03/2016 1052   APPEARANCEUR CLOUDY (A) 03/03/2016 1052   LABSPEC 1.015 03/03/2016 1052   PHURINE 6.0 03/03/2016 1052   GLUCOSEU NEGATIVE 03/03/2016 1052   HGBUR TRACE (A) 03/03/2016 1052   BILIRUBINUR NEGATIVE 03/03/2016 1052   BILIRUBINUR negative 03/19/2014 1631   KETONESUR NEGATIVE 03/03/2016 1052   PROTEINUR NEGATIVE 03/03/2016 1052   UROBILINOGEN 0.2 03/19/2014 1631   UROBILINOGEN 0.2 03/18/2008 0800   NITRITE POSITIVE (A) 03/03/2016 1052   LEUKOCYTESUR LARGE (A) 03/03/2016 1052    Creatinine Clearance: Estimated Creatinine Clearance: 70.1 mL/min (by C-G formula based on SCr of 0.8 mg/dL).  Sepsis Labs: @LABRCNTIP (procalcitonin:4,lacticidven:4) )No results found for this or any previous visit (from the past 240 hour(s)).   Radiological Exams on Admission: Dg Chest 2 View  Result Date: 03/03/2016 CLINICAL DATA:  Weakness, balance disturbance, fall yesterday; history of diabetes, hypertension atrial flutter EXAM: CHEST  2 VIEW COMPARISON:  PA and lateral chest x-ray dated September 17, 2015 FINDINGS: The lungs are mildly hypoinflated. There is dense infiltrate at the right lung base posteriorly. There are small bilateral pleural effusions. The heart is normal in size. The pulmonary vascularity is not engorged. The bony thorax exhibits no acute abnormality. IMPRESSION: Bilateral hypo inflation. Infiltrate in the right lower lobe worrisome for pneumonia. Small bilateral pleural effusions. Mild cardiomegaly without significant pulmonary vascular congestion. Followup PA and lateral chest X-ray is recommended in 3-4 weeks following trial of antibiotic therapy to ensure resolution and exclude underlying malignancy. Electronically Signed   By: David  Martinique M.D.   On: 03/03/2016 09:59  Dg  Forearm Left  Result Date: 03/03/2016 CLINICAL DATA:  Pain following fall EXAM: LEFT FOREARM - 2 VIEW COMPARISON:  None. FINDINGS: Frontal and lateral views were obtained. There is no fracture or dislocation. There is degenerative change in the left elbow and wrist regions. There is a minus ulnar variant. There is a spur  arising from the olecranon process of the proximal ulna. There is a smaller coracoid process proximal ulnar spur. IMPRESSION: Areas of osteoarthritic change. Minus ulnar variance. No acute fracture or dislocation. Electronically Signed   By: Lowella Grip III M.D.   On: 03/03/2016 09:59  Ct Head Wo Contrast  Result Date: 03/03/2016 CLINICAL DATA:  Forehead injury after fall today. No loss of consciousness. EXAM: CT HEAD WITHOUT CONTRAST CT CERVICAL SPINE WITHOUT CONTRAST TECHNIQUE: Multidetector CT imaging of the head and cervical spine was performed following the standard protocol without intravenous contrast. Multiplanar CT image reconstructions of the cervical spine were also generated. COMPARISON:  CT scan of head of Dec 22, 2015. FINDINGS: CT HEAD FINDINGS There is again noted complete opacification of the right frontal, ethmoid and maxillary sinuses consistent with chronic sinusitis. Bony calvarium is unremarkable. Mild diffuse cortical atrophy is noted. Mild chronic ischemic white matter disease is noted. No mass effect or midline shift is noted. Ventricular size is within normal limits. There is no evidence of mass lesion, hemorrhage or acute infarction. CT CERVICAL SPINE FINDINGS No definite fracture or spondylolisthesis is noted. Patient appears to be status post laminectomy of C3, C4, C5, C6 and C7. Extensive degenerative changes seen involving posterior facet joints at these levels. Mild degenerative disc disease is noted at C6-7 and C7-T1. Anterior osteophyte formation is noted at multiple levels. Visualized lung apices are unremarkable. IMPRESSION: Stable findings consistent  with chronic sinusitis of the right frontal, ethmoid and maxillary sinuses. Mild diffuse cortical atrophy. Mild chronic ischemic white matter disease. No acute intracranial abnormality seen. Extensive postsurgical and degenerative changes in the cervical spine as described above. No acute abnormality seen in the cervical spine. Electronically Signed   By: Marijo Conception, M.D.   On: 03/03/2016 10:14  Ct Cervical Spine Wo Contrast  Result Date: 03/03/2016 CLINICAL DATA:  Forehead injury after fall today. No loss of consciousness. EXAM: CT HEAD WITHOUT CONTRAST CT CERVICAL SPINE WITHOUT CONTRAST TECHNIQUE: Multidetector CT imaging of the head and cervical spine was performed following the standard protocol without intravenous contrast. Multiplanar CT image reconstructions of the cervical spine were also generated. COMPARISON:  CT scan of head of Dec 22, 2015. FINDINGS: CT HEAD FINDINGS There is again noted complete opacification of the right frontal, ethmoid and maxillary sinuses consistent with chronic sinusitis. Bony calvarium is unremarkable. Mild diffuse cortical atrophy is noted. Mild chronic ischemic white matter disease is noted. No mass effect or midline shift is noted. Ventricular size is within normal limits. There is no evidence of mass lesion, hemorrhage or acute infarction. CT CERVICAL SPINE FINDINGS No definite fracture or spondylolisthesis is noted. Patient appears to be status post laminectomy of C3, C4, C5, C6 and C7. Extensive degenerative changes seen involving posterior facet joints at these levels. Mild degenerative disc disease is noted at C6-7 and C7-T1. Anterior osteophyte formation is noted at multiple levels. Visualized lung apices are unremarkable. IMPRESSION: Stable findings consistent with chronic sinusitis of the right frontal, ethmoid and maxillary sinuses. Mild diffuse cortical atrophy. Mild chronic ischemic white matter disease. No acute intracranial abnormality seen. Extensive  postsurgical and degenerative changes in the cervical spine as described above. No acute abnormality seen in the cervical spine. Electronically Signed   By: Marijo Conception, M.D.   On: 03/03/2016 10:14   EKG: Independently reviewed. 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 Acute respiratory distress with exertion likely related to community-acquired pneumonia. Chest x-ray right infiltrate and bilateral pleural effusions and mild cardiomegaly without vascular congestion. Oxygen saturation level in the mid 80s with exertion. Trace lower extremity edema. -Oxygen supplementation -BNP -2-D echo -Rocephin and azithromycin -Blood cultures -Sputum culture -Nebulizers -Monitor oxygen saturation level -Wean oxygen as able  #2. Community-acquired pneumonia. Versus aspiration. Infiltrate on the right. Patient does endorse some clearing of the throat when eating and drinking. No leukocytosis afebrile nontoxic appearing chest x-ray as noted above. Lactic acid within the limits of normal -Change antibiotics to Unasyn -Follow blood cultures -Obtain sputum culture -Oxygen supplementation as noted above  #3. Constipation. Patient complains of chronic constipation. Etiology uncertain. Not on any narcotics. Patient reports chronic. Medications include Colace. Reports he did have a BM yesterday. States his averages every 4-10 days -Milk of magnesia -milk and  Molasses enema -I Colace -If no improvement consider abdominal  imaging  4. UTI. Urinalysis consistent with same. He is afebrile nontoxic appearing -Unasyn -Follow urine culture  #5. Diabetes. He is on oral agents at home. Serum glucose 148 on admission -Hold oral agents for now -Use sliding scale insulin for optimal control -Obtain a hemoglobin A1c  #6. Hypertension. Controlled in the emergency department. Home medications include Micardis -Continue home medications -Monitor closely  #7. Falls/physical deconditioning/generalized weakness. Patient reports frequent falls since he was diagnosed with diabetes 2 years ago. CK within the limits of normal -Physical therapy -Hold any sedating meds  #8. Skin decubitus. Sacral area.  -duoderm -overlay mattress -wound consult  #9. Thrombocytopenia/anemia. Platelet level trending down and myoglobin close to baseline. History HCC. No signs active bleeding -Monitor -Outpatient follow-up     DVT prophylaxis: scd  Code Status: full  Family Communication: none  Disposition Plan: home  Consults called: none  Admission status: obs    Dyanne Carrel M MD [facility]  If 7PM-7AM, please contact night-coverage [email or web address] Password [facility]  03/03/2016, 11:42 AM

## 2016-03-03 NOTE — ED Notes (Signed)
Attempted to call report

## 2016-03-03 NOTE — Consult Note (Signed)
[facility] Nurse wound consult note Reason for Consult: sacral wound Patient reports wound present in the gluteal skin fold, has been followed by wound care center MD in [place]. Patient wears incontinence briefs at home, per patient report and has one on currently during my exam.  Wound type: partial thickness skin loss gluteal crease, most likely from moisture Pressure Ulcer POA: No Measurement: 0.3cm x 0.3cm x 0.1cm  Wound bed:100% yellow Drainage (amount, consistency, odor) none Periwound: red but blanchable Dressing procedure/placement/frequency: Continue POC from [place], enzymatic debridement with Santyl, dry dressing.  However patient will be receiving enemas soon, if soilage of the dressing becomes an issue would use barrier cream to protect the area only.    Discussed POC with patient and bedside nurse.  Re consult if needed, will not follow at this time. Thanks  Caylei Sperry [employer], RN,CNS, [place] [phone])

## 2016-03-03 NOTE — Progress Notes (Signed)
Pharmacy Antibiotic Note  Nathan Cunningham is a 82 y.o. male admitted on 03/03/2016 with pneumonia.  Pharmacy has been consulted for unasyn dosing. Pt is afebrile and WBC is WNL. Scr and lactic are also WNL.  Plan: - Unasyn 3gm IV Q6H - F/u renal fxn, C&S, clinical status     Temp (24hrs), Avg:97.7 F (36.5 C), Min:97.6 F (36.4 C), Max:97.7 F (36.5 C)   Recent Labs Lab 03/03/16 0909 03/03/16 1151  WBC 8.2  --   CREATININE 0.69  --   LATICACIDVEN  --  0.71    Estimated Creatinine Clearance: 70.1 mL/min (by C-G formula based on SCr of 0.8 mg/dL).    No Known Allergies  Antimicrobials this admission: Unasyn 7/26>>  Dose adjustments this admission: N/A  Microbiology results: Pending   Thank you for allowing pharmacy to be a part of this patient's care.  Kearia Yin, Rande Lawman 03/03/2016 12:15 PM

## 2016-03-03 NOTE — ED Triage Notes (Signed)
Pt reports to the ED via [facility] following a fall that occurred yesterday. Pt reports he fell again last night and was on the floor til this am. SCCA was cleared. Pt reports fall resulted from his legs giving out on him. Had an episode of this in May but was unsure what his dx was. Has skin tear to the left forearm and hematoma to the forehead from the fall yesterday. VSS en route. Pt A&Ox4, resp e/u, and skin warm and dry.   CBG - 144 mg PTA

## 2016-03-03 NOTE — ED Notes (Signed)
CBG 143  

## 2016-03-03 NOTE — Care Management Obs Status (Signed)
[facility] NOTIFICATION   Patient Details  Name: Nathan Cunningham MRN: [id number] Date of Birth: [birth year]   Medicare Observation Status Notification Given:  Yes    Carles Collet, RN 03/03/2016, 1:24 PM

## 2016-03-03 NOTE — Progress Notes (Signed)
Auto CPAP set up max pressure 16cmH20 minimum pressure of 5cmH20. Per pt, he prefers FFM. RT will continue to monitor.

## 2016-03-03 NOTE — ED Notes (Signed)
Wound care nurse at bedside.

## 2016-03-03 NOTE — ED Notes (Signed)
Admitting at bedside 

## 2016-03-03 NOTE — ED Provider Notes (Signed)
[facility] DEPT Provider Note   CSN: [id number] Arrival date & time: 03/03/16  1740  First Provider Contact:  First MD Initiated Contact with Patient 03/03/16 [phone]        History   Chief Complaint Chief Complaint  Patient presents with  . Fall    HPI   The history is provided by the patient.  Nathan Cunningham is a 82 y.o. male history of diabetes, hyperlipidemia, hypertension here presenting with fall, weakness. He states that he fell yesterday evening and his legs just gave out despite using a walker. He then woke up at night and tried to walk to the bathroom and then fell again and hit his head. He states that he was too weak to get up this morning. He has some chronic cough as well. Denies any fevers or chills. Denies being on any blood thinners. No recent admissions. Was noted to be hypoxic about 90% and placed on oxygen prior to arrival. Not on oxygen at baseline.    Past Medical History:  Diagnosis Date  . Adenomatous polyp of colon 03/2003  . Constipation   . Diabetes mellitus   . Diverticulosis   . Dizziness   . ETOH abuse    binge drinking  . Hemorrhoids   . Hyperlipidemia   . Hypertension   . Macular degeneration    Right eye  . Sleep apnea    on CPAP    Patient Active Problem List   Diagnosis Date Noted  . Sacral decubitus ulcer 02/03/2016  . Decubitus ulcer of sacral region, stage 1 02/03/2016  . Fall 01/01/2016  . Dizziness and giddiness 01/01/2016  . Frequent falls 01/01/2016  . 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 Abnormal CBC 10/21/2014  . Encounter for completion of form with patient 10/21/2014  . Chronic fatigue and malaise 07/22/2014  . Inguinal pain 03/19/2014  . Nocturia 03/19/2014  . 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 Thoracic degenerative disc disease 09/28/2010  . ATRIAL FLUTTER 02/27/2010  . APHTHOUS ULCERS 01/20/2010  . RISK OF FALLING 10/21/2009  . 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 PITYRIASIS [place]  01/21/2009  . WEIGHT GAIN 04/01/2008  . EDEMA 10/02/2007  . URINARY FREQUENCY, CHRONIC 10/02/2007  . GLOSSITIS 08/31/2007  . BEN LOC HYPERPLASIA PROS W/O UR OBST & OTH LUTS 08/31/2007  . HTN (hypertension) 04/24/2007  . Diabetes ([place] 03/23/2007  . B12 deficiency 03/23/2007  . CONSTIPATION, SLOW TRANSIT 03/23/2007  . DISORDER, CERVICAL [place] W/MYELOPATHY 03/23/2007  . STENOSIS, LUMBAR SPINE 03/23/2007  . Essential hypertension 02/01/2007    Past Surgical History:  Procedure Laterality Date  . APPENDECTOMY     age 66  . CARDIAC DEFIBRILLATOR PLACEMENT    . CATARACT EXTRACTION     Left eye  . cevical fusion    . INGUINAL HERNIA REPAIR     age 44  . TONSILLECTOMY         Home Medications    Prior to Admission medications   Medication Sig Start Date End Date Taking? Authorizing Provider  amitriptyline (ELAVIL) 75 MG tablet TAKE 1 TABLET BY MOUTH AT BEDTIME 09/10/15   Lucille Passy, MD  aspirin 325 MG tablet Take 162 mg by mouth daily.    Historical Provider, MD  Blood Glucose Monitoring Suppl (ONE TOUCH ULTRA SYSTEM KIT) W/DEVICE KIT 1 kit by Does not apply route once. 12/26/14   Lucille Passy, MD  Chromium Picolinate (RA CHROMIUM PICOLINATE) 400 MCG TABS Take 1 tablet by mouth daily.  Historical Provider, MD  cyanocobalamin (,VITAMIN B-12,) 1000 MCG/ML injection Inject 1048mg every 60days IM (every other month) 03/21/14   TLucille Passy MD  docusate sodium (COLACE) 100 MG capsule Take 100 mg by mouth 3 (three) times daily as needed.      Historical Provider, MD  dorzolamide-timolol (COSOPT) 22.3-6.8 MG/ML ophthalmic solution 1 drop 2 (two) times daily.      Historical Provider, MD  fish oil-omega-3 fatty acids 1000 MG capsule Take by mouth. Take six daily    Historical Provider, MD  glipiZIDE (GLUCOTROL XL) 5 MG 24 hr tablet Take 1 tablet (5 mg total) by mouth daily. 01/22/16   TLucille Passy MD  glucose blood (ONE TOUCH ULTRA TEST) test strip USE AS DIRECTED TO TEST BLOOD SUGAR  ONCE DAILY 06/06/15   TLucille Passy MD  Lancets ([facility]) lancets Use as instructed 12/26/14   TLucille Passy MD  Magnesium-Calcium-Folic Acid (MAGNEBIND 4335 400-200-1 MG TABS Take 1 tablet by mouth daily.      Historical Provider, MD  Melatonin 5 MG CAPS Take 1 each by mouth daily.      Historical Provider, MD  Multiple Vitamins-Minerals (EYE-VITES) TABS Take 1 tablet by mouth 2 (two) times daily.      Historical Provider, MD  Potassium Gluconate (K-99) 595 MG CAPS Take 1 each by mouth daily.      Historical Provider, MD  Saxagliptin-Metformin (KOMBIGLYZE XR) 12-998 MG TB24 Take 1 each by mouth daily. 09/09/15   TLucille Passy MD  sitaGLIPtin (JANUVIA) 100 MG tablet Take 1 tablet (100 mg total) by mouth daily. 10/02/15   TLucille Passy MD  telmisartan (MICARDIS) 80 MG tablet TAKE 1 TABLET DAILY 09/09/15   TLucille Passy MD  Triamcinolone Acetonide (TRIAMCINOLONE 0.1 % CREAM : EUCERIN) CREA Apply 1 application topically 3 (three) times daily as needed. 02/05/16   TLucille Passy MD    Family History Family History  Problem Relation Age of Onset  . Heart disease Father   . Stroke Father 7100   Social History Social History  Substance Use Topics  . Smoking status: Never Smoker  . Smokeless tobacco: Never Used  . Alcohol use 7.0 oz/week    14 Standard drinks or equivalent per week     Allergies   Review of patient's allergies indicates no known allergies.   Review of Systems Review of Systems  Respiratory: Positive for cough.   All other systems reviewed and are negative.    Physical Exam Updated Vital Signs BP 115/71 (BP Location: Left Arm)   Pulse 77   Temp 97.7 F (36.5 C) (Oral)   Resp 14   SpO2 91%   Physical Exam  Constitutional: He is oriented to person, place, and time.  Chronically ill   HENT:  Head: Normocephalic.  No obvious scalp hematoma   Eyes: EOM are normal. Pupils are equal, round, and reactive to light.  Neck: Normal range of motion. Neck supple.   Cardiovascular: Normal rate, regular rhythm and normal heart sounds.   Pulmonary/Chest: Effort normal.  Diminished bilateral bases.   Abdominal: Soft. Bowel sounds are normal.  Musculoskeletal: Normal range of motion.  L forearm with skin tears. No obvious bony tenderness. Pelvis stable, nl ROM bilateral hips   Neurological: He is alert and oriented to person, place, and time. No cranial nerve deficit.  Strength 4/5 throughout   Skin: Skin is warm.  Psychiatric: He has a normal mood and affect.  Nursing  note and vitals reviewed.    ED Treatments / Results  Labs (all labs ordered are listed, but only abnormal results are displayed) Labs Reviewed  CBC WITH DIFFERENTIAL/PLATELET - Abnormal; Notable for the following:       Result Value   RBC 3.57 (*)    Hemoglobin 10.9 (*)    HCT 33.4 (*)    Platelets 117 (*)    Monocytes Absolute 2.9 (*)    All other components within normal limits  COMPREHENSIVE METABOLIC PANEL - Abnormal; Notable for the following:    Sodium 134 (*)    Chloride 100 (*)    Glucose, Bld 148 (*)    Calcium 8.8 (*)    Albumin 2.8 (*)    AST 12 (*)    ALT 13 (*)    All other components within normal limits  URINE CULTURE  CULTURE, BLOOD (ROUTINE X 2)  CULTURE, BLOOD (ROUTINE X 2)  CK  URINALYSIS, ROUTINE W REFLEX MICROSCOPIC (NOT AT [facility])  I-STAT TROPOININ, ED  I-STAT CG4 LACTIC ACID, ED    EKG  EKG Interpretation  Date/Time:  Wednesday March 03 2016 08:41:20 EDT Ventricular Rate:  80 PR Interval:    QRS Duration: 96 QT Interval:  397 QTC Calculation: 458 R Axis:     Text Interpretation:  Sinus rhythm Low voltage, extremity and precordial leads Consider anterior infarct Borderline repolarization abnormality Baseline wander in lead(s) V6 No significant change since last tracing Confirmed by Anneliese Leblond  MD, Jontavius Rabalais ([id number]) on 03/03/2016 8:53:37 AM       Radiology Dg Chest 2 View  Result Date: 03/03/2016 CLINICAL DATA:  Weakness, balance disturbance, fall  yesterday; history of diabetes, hypertension atrial flutter EXAM: CHEST  2 VIEW COMPARISON:  PA and lateral chest x-ray dated September 17, 2015 FINDINGS: The lungs are mildly hypoinflated. There is dense infiltrate at the right lung base posteriorly. There are small bilateral pleural effusions. The heart is normal in size. The pulmonary vascularity is not engorged. The bony thorax exhibits no acute abnormality. IMPRESSION: Bilateral hypo inflation. Infiltrate in the right lower lobe worrisome for pneumonia. Small bilateral pleural effusions. Mild cardiomegaly without significant pulmonary vascular congestion. Followup PA and lateral chest X-ray is recommended in 3-4 weeks following trial of antibiotic therapy to ensure resolution and exclude underlying malignancy. Electronically Signed   By: Tonie Vizcarrondo  [place] M.D.   On: 03/03/2016 09:59  Dg Forearm Left  Result Date: 03/03/2016 CLINICAL DATA:  Pain following fall EXAM: LEFT FOREARM - 2 VIEW COMPARISON:  None. FINDINGS: Frontal and lateral views were obtained. There is no fracture or dislocation. There is degenerative change in the left elbow and wrist regions. There is a minus ulnar variant. There is a spur arising from the olecranon process of the proximal ulna. There is a smaller coracoid process proximal ulnar spur. IMPRESSION: Areas of osteoarthritic change. Minus ulnar variance. No acute fracture or dislocation. Electronically Signed   By: Lowella Grip III M.D.   On: 03/03/2016 09:59  Ct Head Wo Contrast  Result Date: 03/03/2016 CLINICAL DATA:  Forehead injury after fall today. No loss of consciousness. EXAM: CT HEAD WITHOUT CONTRAST CT CERVICAL SPINE WITHOUT CONTRAST TECHNIQUE: Multidetector CT imaging of the head and cervical spine was performed following the standard protocol without intravenous contrast. Multiplanar CT image reconstructions of the cervical spine were also generated. COMPARISON:  CT scan of head of Dec 22, 2015. FINDINGS: CT HEAD  FINDINGS There is again noted complete opacification of the right frontal, ethmoid and  maxillary sinuses consistent with chronic sinusitis. Bony calvarium is unremarkable. Mild diffuse cortical atrophy is noted. Mild chronic ischemic white matter disease is noted. No mass effect or midline shift is noted. Ventricular size is within normal limits. There is no evidence of mass lesion, hemorrhage or acute infarction. CT CERVICAL SPINE FINDINGS No definite fracture or spondylolisthesis is noted. Patient appears to be status post laminectomy of C3, C4, C5, C6 and C7. Extensive degenerative changes seen involving posterior facet joints at these levels. Mild degenerative disc disease is noted at C6-7 and C7-T1. Anterior osteophyte formation is noted at multiple levels. Visualized lung apices are unremarkable. IMPRESSION: Stable findings consistent with chronic sinusitis of the right frontal, ethmoid and maxillary sinuses. Mild diffuse cortical atrophy. Mild chronic ischemic white matter disease. No acute intracranial abnormality seen. Extensive postsurgical and degenerative changes in the cervical spine as described above. No acute abnormality seen in the cervical spine. Electronically Signed   By: Marijo Conception, M.D.   On: 03/03/2016 10:14  Ct Cervical Spine Wo Contrast  Result Date: 03/03/2016 CLINICAL DATA:  Forehead injury after fall today. No loss of consciousness. EXAM: CT HEAD WITHOUT CONTRAST CT CERVICAL SPINE WITHOUT CONTRAST TECHNIQUE: Multidetector CT imaging of the head and cervical spine was performed following the standard protocol without intravenous contrast. Multiplanar CT image reconstructions of the cervical spine were also generated. COMPARISON:  CT scan of head of Dec 22, 2015. FINDINGS: CT HEAD FINDINGS There is again noted complete opacification of the right frontal, ethmoid and maxillary sinuses consistent with chronic sinusitis. Bony calvarium is unremarkable. Mild diffuse cortical atrophy is  noted. Mild chronic ischemic white matter disease is noted. No mass effect or midline shift is noted. Ventricular size is within normal limits. There is no evidence of mass lesion, hemorrhage or acute infarction. CT CERVICAL SPINE FINDINGS No definite fracture or spondylolisthesis is noted. Patient appears to be status post laminectomy of C3, C4, C5, C6 and C7. Extensive degenerative changes seen involving posterior facet joints at these levels. Mild degenerative disc disease is noted at C6-7 and C7-T1. Anterior osteophyte formation is noted at multiple levels. Visualized lung apices are unremarkable. IMPRESSION: Stable findings consistent with chronic sinusitis of the right frontal, ethmoid and maxillary sinuses. Mild diffuse cortical atrophy. Mild chronic ischemic white matter disease. No acute intracranial abnormality seen. Extensive postsurgical and degenerative changes in the cervical spine as described above. No acute abnormality seen in the cervical spine. Electronically Signed   By: Marijo Conception, M.D.   On: 03/03/2016 10:14   Procedures Procedures (including critical care time)  Medications Ordered in ED Medications  cefTRIAXone (ROCEPHIN) 1 g in dextrose 5 % 50 mL IVPB (not administered)  azithromycin (ZITHROMAX) 500 mg in dextrose 5 % 250 mL IVPB (not administered)  sodium chloride 0.9 % bolus 500 mL (500 mLs Intravenous New Bag/Given 03/03/16 1015)     Initial Impression / Assessment and Plan / ED Course  I have reviewed the triage vital signs and the nursing notes.  Pertinent labs & imaging results that were available during my care of the patient were reviewed by me and considered in my medical decision making (see chart for details).  Clinical Course   SHERON ROBIN is a 82 y.o. male here with weakness, cough. Unable to get up after he fell yesterday. Has head injury as well. Consider head bleed vs aspiration pneumonia vs rhabdo. Will get labs, CT head, CXR, CK.   10:45  AM CT unremarkable. CXR  showed pneumonia. Given that he is hypoxic 90% on RA and requires oxygen and too weak to walk, will order lactate, cultures, give ceftriaxone/azithro. Will admit.    Final Clinical Impressions(s) / ED Diagnoses   Final diagnoses:  None    New Prescriptions New Prescriptions   No medications on file     Drenda Freeze, MD 03/03/16 1047

## 2016-03-03 NOTE — ED Notes (Signed)
Dr. Yao at bedside. 

## 2016-03-04 ENCOUNTER — Observation Stay (HOSPITAL_COMMUNITY): Payer: Medicare Other

## 2016-03-04 DIAGNOSIS — J9601 Acute respiratory failure with hypoxia: Secondary | ICD-10-CM | POA: Diagnosis not present

## 2016-03-04 DIAGNOSIS — M5000 Cervical disc disorder with myelopathy, unspecified cervical region: Secondary | ICD-10-CM | POA: Diagnosis present

## 2016-03-04 DIAGNOSIS — R296 Repeated falls: Secondary | ICD-10-CM | POA: Diagnosis present

## 2016-03-04 DIAGNOSIS — J189 Pneumonia, unspecified organism: Secondary | ICD-10-CM

## 2016-03-04 DIAGNOSIS — E119 Type 2 diabetes mellitus without complications: Secondary | ICD-10-CM | POA: Diagnosis present

## 2016-03-04 DIAGNOSIS — N390 Urinary tract infection, site not specified: Secondary | ICD-10-CM | POA: Diagnosis not present

## 2016-03-04 DIAGNOSIS — R279 Unspecified lack of coordination: Secondary | ICD-10-CM | POA: Diagnosis not present

## 2016-03-04 DIAGNOSIS — I4892 Unspecified atrial flutter: Secondary | ICD-10-CM | POA: Diagnosis not present

## 2016-03-04 DIAGNOSIS — W0110XA Fall on same level from slipping, tripping and stumbling with subsequent striking against unspecified object, initial encounter: Secondary | ICD-10-CM | POA: Diagnosis present

## 2016-03-04 DIAGNOSIS — Z95810 Presence of automatic (implantable) cardiac defibrillator: Secondary | ICD-10-CM | POA: Diagnosis not present

## 2016-03-04 DIAGNOSIS — Z9181 History of falling: Secondary | ICD-10-CM | POA: Diagnosis not present

## 2016-03-04 DIAGNOSIS — G4730 Sleep apnea, unspecified: Secondary | ICD-10-CM | POA: Diagnosis present

## 2016-03-04 DIAGNOSIS — S0990XA Unspecified injury of head, initial encounter: Secondary | ICD-10-CM | POA: Diagnosis present

## 2016-03-04 DIAGNOSIS — Z7982 Long term (current) use of aspirin: Secondary | ICD-10-CM | POA: Diagnosis not present

## 2016-03-04 DIAGNOSIS — M6281 Muscle weakness (generalized): Secondary | ICD-10-CM | POA: Diagnosis not present

## 2016-03-04 DIAGNOSIS — Z9842 Cataract extraction status, left eye: Secondary | ICD-10-CM | POA: Diagnosis not present

## 2016-03-04 DIAGNOSIS — K5909 Other constipation: Secondary | ICD-10-CM | POA: Diagnosis present

## 2016-03-04 DIAGNOSIS — R41841 Cognitive communication deficit: Secondary | ICD-10-CM | POA: Diagnosis not present

## 2016-03-04 DIAGNOSIS — Z981 Arthrodesis status: Secondary | ICD-10-CM | POA: Diagnosis not present

## 2016-03-04 DIAGNOSIS — I119 Hypertensive heart disease without heart failure: Secondary | ICD-10-CM | POA: Diagnosis present

## 2016-03-04 DIAGNOSIS — D696 Thrombocytopenia, unspecified: Secondary | ICD-10-CM | POA: Diagnosis present

## 2016-03-04 DIAGNOSIS — J90 Pleural effusion, not elsewhere classified: Secondary | ICD-10-CM | POA: Diagnosis present

## 2016-03-04 DIAGNOSIS — K649 Unspecified hemorrhoids: Secondary | ICD-10-CM | POA: Diagnosis present

## 2016-03-04 DIAGNOSIS — L89302 Pressure ulcer of unspecified buttock, stage 2: Secondary | ICD-10-CM | POA: Diagnosis present

## 2016-03-04 DIAGNOSIS — B9620 Unspecified Escherichia coli [E. coli] as the cause of diseases classified elsewhere: Secondary | ICD-10-CM | POA: Diagnosis not present

## 2016-03-04 DIAGNOSIS — E785 Hyperlipidemia, unspecified: Secondary | ICD-10-CM | POA: Diagnosis present

## 2016-03-04 DIAGNOSIS — C9390 Monocytic leukemia, unspecified, not having achieved remission: Secondary | ICD-10-CM | POA: Diagnosis present

## 2016-03-04 DIAGNOSIS — D638 Anemia in other chronic diseases classified elsewhere: Secondary | ICD-10-CM | POA: Diagnosis present

## 2016-03-04 DIAGNOSIS — R278 Other lack of coordination: Secondary | ICD-10-CM | POA: Diagnosis not present

## 2016-03-04 DIAGNOSIS — Z7984 Long term (current) use of oral hypoglycemic drugs: Secondary | ICD-10-CM | POA: Diagnosis not present

## 2016-03-04 DIAGNOSIS — R0602 Shortness of breath: Secondary | ICD-10-CM | POA: Diagnosis not present

## 2016-03-04 DIAGNOSIS — R2681 Unsteadiness on feet: Secondary | ICD-10-CM | POA: Diagnosis not present

## 2016-03-04 LAB — CBC
HEMATOCRIT: 33.3 % — AB (ref 39.0–52.0)
Hemoglobin: 10.6 g/dL — ABNORMAL LOW (ref 13.0–17.0)
MCH: 29.8 pg (ref 26.0–34.0)
MCHC: 31.8 g/dL (ref 30.0–36.0)
MCV: 93.5 fL (ref 78.0–100.0)
Platelets: 118 10*3/uL — ABNORMAL LOW (ref 150–400)
RBC: 3.56 MIL/uL — AB (ref 4.22–5.81)
RDW: 14.8 % (ref 11.5–15.5)
WBC: 7.2 10*3/uL (ref 4.0–10.5)

## 2016-03-04 LAB — ECHOCARDIOGRAM COMPLETE: WEIGHTICAEL: 3236.8 [oz_av]

## 2016-03-04 LAB — BASIC METABOLIC PANEL
Anion gap: 4 — ABNORMAL LOW (ref 5–15)
BUN: 8 mg/dL (ref 6–20)
CHLORIDE: 102 mmol/L (ref 101–111)
CO2: 30 mmol/L (ref 22–32)
Calcium: 8.4 mg/dL — ABNORMAL LOW (ref 8.9–10.3)
Creatinine, Ser: 0.66 mg/dL (ref 0.61–1.24)
GFR calc non Af Amer: >60 mL/min (ref >60)
Glucose, Bld: 143 mg/dL — ABNORMAL HIGH (ref 65–99)
POTASSIUM: 4.3 mmol/L (ref 3.5–5.1)
SODIUM: 136 mmol/L (ref 135–145)

## 2016-03-04 LAB — GLUCOSE, CAPILLARY
GLUCOSE-CAPILLARY: 133 mg/dL — AB (ref 65–99)
Glucose-Capillary: 120 mg/dL — ABNORMAL HIGH (ref 65–99)
Glucose-Capillary: 148 mg/dL — ABNORMAL HIGH (ref 65–99)
Glucose-Capillary: 129 mg/dL — ABNORMAL HIGH (ref 65–99)

## 2016-03-04 LAB — HEMOGLOBIN A1C
Hgb A1c MFr Bld: 6.2 % — ABNORMAL HIGH (ref 4.8–5.6)
MEAN PLASMA GLUCOSE: 131 mg/dL

## 2016-03-04 LAB — STREP PNEUMONIAE URINARY ANTIGEN: Strep Pneumo Urinary Antigen: NEGATIVE

## 2016-03-04 MED ORDER — POLYETHYLENE GLYCOL 3350 17 G PO PACK
17.0000 g | PACK | Freq: Every day | ORAL | Status: DC
  Administered 2016-03-05 – 2016-03-08 (×4): 17 g via ORAL
  Filled 2016-03-04 (×4): qty 1

## 2016-03-04 MED ORDER — ADULT MULTIVITAMIN W/MINERALS CH
1.0000 | ORAL_TABLET | Freq: Every day | ORAL | Status: DC
  Administered 2016-03-05 – 2016-03-08 (×4): 1 via ORAL
  Filled 2016-03-04 (×4): qty 1

## 2016-03-04 NOTE — Progress Notes (Signed)
Progress Note    BURDELL POMPER  [id number] DOB: [birth year]  DOA: 03/03/2016 PCP: Arnette Norris, MD    Brief Narrative:   Nathan Cunningham is an 82 y.o. male with a PMH significant for diabetes, hypertension, macular degeneration, diverticulosis, HCC, chronic constipation and frequent falls Who was admitted 03/03/16 with chief complaint of generalized weakness with frequent falls. Initial concerns were for pneumonia. He was afebrile but had hypoxia with oxygen saturations dropping into the 88% range with movement. Chest x-ray showed a right lower lobe infiltrate.  Assessment/Plan:   Principal Problem:   CAP (community acquired pneumonia) with acute hypoxic respiratory failure/acute respiratory distress Patient was admitted and placed on oxygen supplementation. Empiric Unasyn was started to cover aspiration pathogens. Blood cultures/sputum cultures were sent. Continue nebulizers as needed. Will request speech therapist evaluation. Strep pneumonia urinary antigen was negative. Follow-up Legionella antigen testing. No leukocytosis or lactic acidosis.  Active Problems:   Diabetes mellitus with complication ([place]) On oral hypoglycemics at home. Currently being managed with moderate scale SSI. Hemoglobin A1c 6.2%. CBGs controlled.    HTN (hypertension) Continue Avapro.    Constipation Chronic problem. Given a milk and molasses enema with good results.  Add daily MiraLAX.     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 Folate WNL. B-12 360. Findings consistent with anemia of chronic disease in the setting of a low monocytic leukemia.    Frequent falls/physical deconditioning and cervical disc disorder with myelopathy Consider discontinuing Elavil.    Decubitus ulcer of buttock, stage 2 Continue wound care with Santyl.    UTI (lower urinary tract infection) Urinalysis concerning for UTI. On empiric  antibiotics for CAP.   Family Communication/Anticipated D/C date and plan/Code Status   DVT prophylaxis: Lovenox ordered. Code Status: Full Code.  Family Communication: No family at the bedside. Disposition Plan: Home in 24-48 hours depending on progress.   Medical Consultants:    None.   Procedures:    Anti-Infectives:   Rocephin/Azithromcyin x 1 03/03/16 Unasyn 03/03/16--->  Subjective:    SAHID Cunningham has a dry cough, non-productive.  Feels weak.  Reports multiple stools after being given a milk and molasses enema.  Objective:    Vitals:   03/03/16 1659 03/03/16 1716 03/03/16 2222 03/04/16 0532  BP:   (!) 103/46 127/64  Pulse: 80 83 73 79  Resp: 16 18 17 16   Temp:   98.3 F (36.8 C) 98 F (36.7 C)  TempSrc:   Oral Oral  SpO2:  97% 97% 95%  Weight:    91.8 kg (202 lb 4.8 oz)    Intake/Output Summary (Last 24 hours) at 03/04/16 0939 Last data filed at 03/04/16 0845  Gross per 24 hour  Intake           958.75 ml  Output             1130 ml  Net          -171.25 ml   Filed Weights   03/04/16 0532  Weight: 91.8 kg (202 lb 4.8 oz)    Exam: General exam: Appears calm and comfortable.  Respiratory system: Clear to auscultation, but diminished in the bases. Respiratory effort normal. Cardiovascular system: S1 & S2 heard, RRR. No JVD,  rubs, gallops or clicks. No murmurs. Gastrointestinal system: Abdomen is nondistended, soft and nontender. No organomegaly or masses felt. Normal bowel sounds heard. Central nervous system: Alert and oriented. No  focal neurological deficits. Extremities: No clubbing,  or cyanosis. No edema. Skin: No rashes, lesions or ulcers. Psychiatry: Judgement and insight appear normal. Mood & affect appropriate.   Data Reviewed:   I have personally reviewed following labs and imaging studies:  Labs: Basic Metabolic Panel:  Recent Labs Lab 03/03/16 0909 03/04/16 0523  NA 134* 136  K 4.5 4.3  CL 100* 102  CO2 29 30    GLUCOSE 148* 143*  BUN 16 8  CREATININE 0.69 0.66  CALCIUM 8.8* 8.4*   GFR Estimated Creatinine Clearance: 70.2 mL/min (by C-G formula based on SCr of 0.8 mg/dL). Liver Function Tests:  Recent Labs Lab 03/03/16 0909  AST 12*  ALT 13*  ALKPHOS 51  BILITOT 0.6  PROT 6.5  ALBUMIN 2.8*   CBC:  Recent Labs Lab 03/03/16 0909 03/04/16 0523  WBC 8.2 7.2  NEUTROABS 3.8  --   HGB 10.9* 10.6*  HCT 33.4* 33.3*  MCV 93.6 93.5  PLT 117* 118*   Cardiac Enzymes:  Recent Labs Lab 03/03/16 0909  CKTOTAL 81   CBG:  Recent Labs Lab 03/03/16 1224 03/03/16 1720 03/03/16 2220 03/04/16 0759  GLUCAP 143* 97 142* 120*   Hgb A1c:  Recent Labs  03/03/16 1146  HGBA1C 6.2*   Anemia work up:  Recent Labs  03/03/16 1346  VITAMINB12 360  FOLATE 32.9  FERRITIN 80  TIBC 270  IRON 68  RETICCTPCT 1.9   Sepsis Labs:  Recent Labs Lab 03/03/16 0909 03/03/16 1151 03/04/16 0523  WBC 8.2  --  7.2  LATICACIDVEN  --  0.71  --    Urine analysis:    Component Value Date/Time   COLORURINE YELLOW 03/03/2016 1052   APPEARANCEUR CLOUDY (A) 03/03/2016 1052   LABSPEC 1.015 03/03/2016 1052   PHURINE 6.0 03/03/2016 1052   GLUCOSEU NEGATIVE 03/03/2016 1052   HGBUR TRACE (A) 03/03/2016 1052   BILIRUBINUR NEGATIVE 03/03/2016 1052   BILIRUBINUR negative 03/19/2014 1631   KETONESUR NEGATIVE 03/03/2016 1052   PROTEINUR NEGATIVE 03/03/2016 1052   UROBILINOGEN 0.2 03/19/2014 1631   UROBILINOGEN 0.2 03/18/2008 0800   NITRITE POSITIVE (A) 03/03/2016 1052   LEUKOCYTESUR LARGE (A) 03/03/2016 1052   Microbiology No results found for this or any previous visit (from the past 240 hour(s)).  Radiology: Dg Chest 2 View  Result Date: 03/03/2016 CLINICAL DATA:  Weakness, balance disturbance, fall yesterday; history of diabetes, hypertension atrial flutter EXAM: CHEST  2 VIEW COMPARISON:  PA and lateral chest x-ray dated September 17, 2015 FINDINGS: The lungs are mildly hypoinflated. There  is dense infiltrate at the right lung base posteriorly. There are small bilateral pleural effusions. The heart is normal in size. The pulmonary vascularity is not engorged. The bony thorax exhibits no acute abnormality. IMPRESSION: Bilateral hypo inflation. Infiltrate in the right lower lobe worrisome for pneumonia. Small bilateral pleural effusions. Mild cardiomegaly without significant pulmonary vascular congestion. Followup PA and lateral chest X-ray is recommended in 3-4 weeks following trial of antibiotic therapy to ensure resolution and exclude underlying malignancy. Electronically Signed   By: David  Martinique M.D.   On: 03/03/2016 09:59  Dg Forearm Left  Result Date: 03/03/2016 CLINICAL DATA:  Pain following fall EXAM: LEFT FOREARM - 2 VIEW COMPARISON:  None. FINDINGS: Frontal and lateral views were obtained. There is no fracture or dislocation. There is degenerative change in the left elbow and wrist regions. There is a minus ulnar variant. There is a spur arising from the olecranon process of the proximal ulna.  There is a smaller coracoid process proximal ulnar spur. IMPRESSION: Areas of osteoarthritic change. Minus ulnar variance. No acute fracture or dislocation. Electronically Signed   By: Lowella Grip III M.D.   On: 03/03/2016 09:59  Ct Head Wo Contrast  Result Date: 03/03/2016 CLINICAL DATA:  Forehead injury after fall today. No loss of consciousness. EXAM: CT HEAD WITHOUT CONTRAST CT CERVICAL SPINE WITHOUT CONTRAST TECHNIQUE: Multidetector CT imaging of the head and cervical spine was performed following the standard protocol without intravenous contrast. Multiplanar CT image reconstructions of the cervical spine were also generated. COMPARISON:  CT scan of head of Dec 22, 2015. FINDINGS: CT HEAD FINDINGS There is again noted complete opacification of the right frontal, ethmoid and maxillary sinuses consistent with chronic sinusitis. Bony calvarium is unremarkable. Mild diffuse cortical  atrophy is noted. Mild chronic ischemic white matter disease is noted. No mass effect or midline shift is noted. Ventricular size is within normal limits. There is no evidence of mass lesion, hemorrhage or acute infarction. CT CERVICAL SPINE FINDINGS No definite fracture or spondylolisthesis is noted. Patient appears to be status post laminectomy of C3, C4, C5, C6 and C7. Extensive degenerative changes seen involving posterior facet joints at these levels. Mild degenerative disc disease is noted at C6-7 and C7-T1. Anterior osteophyte formation is noted at multiple levels. Visualized lung apices are unremarkable. IMPRESSION: Stable findings consistent with chronic sinusitis of the right frontal, ethmoid and maxillary sinuses. Mild diffuse cortical atrophy. Mild chronic ischemic white matter disease. No acute intracranial abnormality seen. Extensive postsurgical and degenerative changes in the cervical spine as described above. No acute abnormality seen in the cervical spine. Electronically Signed   By: Marijo Conception, M.D.   On: 03/03/2016 10:14  Ct Cervical Spine Wo Contrast  Result Date: 03/03/2016 CLINICAL DATA:  Forehead injury after fall today. No loss of consciousness. EXAM: CT HEAD WITHOUT CONTRAST CT CERVICAL SPINE WITHOUT CONTRAST TECHNIQUE: Multidetector CT imaging of the head and cervical spine was performed following the standard protocol without intravenous contrast. Multiplanar CT image reconstructions of the cervical spine were also generated. COMPARISON:  CT scan of head of Dec 22, 2015. FINDINGS: CT HEAD FINDINGS There is again noted complete opacification of the right frontal, ethmoid and maxillary sinuses consistent with chronic sinusitis. Bony calvarium is unremarkable. Mild diffuse cortical atrophy is noted. Mild chronic ischemic white matter disease is noted. No mass effect or midline shift is noted. Ventricular size is within normal limits. There is no evidence of mass lesion, hemorrhage  or acute infarction. CT CERVICAL SPINE FINDINGS No definite fracture or spondylolisthesis is noted. Patient appears to be status post laminectomy of C3, C4, C5, C6 and C7. Extensive degenerative changes seen involving posterior facet joints at these levels. Mild degenerative disc disease is noted at C6-7 and C7-T1. Anterior osteophyte formation is noted at multiple levels. Visualized lung apices are unremarkable. IMPRESSION: Stable findings consistent with chronic sinusitis of the right frontal, ethmoid and maxillary sinuses. Mild diffuse cortical atrophy. Mild chronic ischemic white matter disease. No acute intracranial abnormality seen. Extensive postsurgical and degenerative changes in the cervical spine as described above. No acute abnormality seen in the cervical spine. Electronically Signed   By: Marijo Conception, M.D.   On: 03/03/2016 10:14   Medications:   . amitriptyline  75 mg Oral QHS  . ampicillin-sulbactam (UNASYN) IV  3 g Intravenous Q6H  . aspirin  162 mg Oral Daily  . collagenase   Topical Daily  . docusate  sodium  100 mg Oral BID  . dorzolamide-timolol  1 drop Both Eyes BID  . enoxaparin (LOVENOX) injection  40 mg Subcutaneous Q24H  . insulin aspart  0-15 Units Subcutaneous TID WC  . insulin aspart  0-5 Units Subcutaneous QHS  . irbesartan  150 mg Oral Daily  . magnesium hydroxide  30 mL Oral Daily   Continuous Infusions:   Time spent: 35 minutes with > 50% of time discussing current diagnostic test results, clinical impression and plan of care.   LOS: 0 days   RAMA,CHRISTINA  [facility] Pager [phone]. If unable to reach me by pager, please call my cell phone at [phone].  *Please refer to [email or web address], password [email or web address] to get updated schedule on who will round on this patient, as hospitalists switch teams weekly. If 7PM-7AM, please contact night-coverage at [email or web address], password [email or web address] for any overnight needs.  03/04/2016, 9:39 AM

## 2016-03-04 NOTE — Progress Notes (Signed)
PT Cancellation Note  Patient Details Name: Nathan Cunningham MRN: [id number] DOB: [birth year]   Cancelled Treatment:    Reason Eval/Treat Not Completed: Patient at procedure or test/unavailable. Will check back as schedule allows.    Weston Anna, MPT Pager: [phone]

## 2016-03-04 NOTE — Progress Notes (Signed)
Initial Nutrition Assessment  DOCUMENTATION CODES:   Obesity unspecified  INTERVENTION:   -MVI daily  NUTRITION DIAGNOSIS:   Increased nutrient needs related to wound healing as evidenced by estimated needs.  GOAL:   Patient will meet greater than or equal to 90% of their needs  MONITOR:   PO intake, Diet advancement, Labs, Skin, I & O's  REASON FOR ASSESSMENT:   Low Braden    ASSESSMENT:   Nathan Cunningham is a 82 y.o. male with a Past Medical History of anemia, constipation, DM, HLD, HTN, OSA who presents with acute respiratory distress likely from aspiration pneumonia, falls, sacral decubitus ulcers, and a UTI.   Pt admitted with acute respiratory distress.   Spoke with pt at bedside. He reports a decline in appetite over the past few months; he shares he experiences early satiety as the day goes on. Typical intake is as follows: Breakfast: eggs, toast, fresh fruit, and cinnamon roll, Lunch: sandwich, DInner: 6 bites of meat.   Pt reports weight loss, but states it has been intention to assist with DM control and mobility.   Reviewed CWOCN note; pt with partial thickness skin loss in sacral area. Pt reveals he is followed by the wound center and recently started taking a MVI for wound healing. He also expresses importance of good protein intake to support wound healing.   Nutrition-Focused physical exam completed. Findings are no fat depletion, no muscle depletion, and mild edema.   SLP evaluated pt prior to visit; he denies any issues with swallowing, however, amenable to go down to MBSS later on today.   Labs reviewed: CBGS: 1201-48.   Diet Order:  Diet Carb Modified Fluid consistency: Thin; Room service appropriate? Yes  Skin:  Wound (see comment) (partial thickness skin loss on sacral area)  Last BM:  03/04/16  Height:   Ht Readings from Last 1 Encounters:  03/04/16 5\' 8"  (1.727 m)    Weight:   Wt Readings from Last 1 Encounters:  03/04/16 202 lb  4.8 oz (91.8 kg)    Ideal Body Weight:  70 kg  BMI:  Body mass index is 30.76 kg/m.  Estimated Nutritional Needs:   Kcal:  1900-2100  Protein:  90-105 grams  Fluid:  1.9-2.1 L  EDUCATION NEEDS:   Education needs addressed  Raihan Kimmel A. Jimmye Norman, RD, LDN, CDE Pager: [phone] After hours Pager: [phone]

## 2016-03-04 NOTE — Evaluation (Signed)
Clinical/Bedside Swallow Evaluation Patient Details  Name: Nathan Cunningham MRN: [id number] Date of Birth: [birth year]  Today's Date: 03/04/2016 Time: SLP Start Time (ACUTE ONLY): 1140 SLP Stop Time (ACUTE ONLY): 1200 SLP Time Calculation (min) (ACUTE ONLY): 20 min  Past Medical History:  Past Medical History:  Diagnosis Date  . Adenomatous polyp of colon 03/2003  . Anemia   . Constipation   . Diverticulosis   . Dizziness   . ETOH abuse    binge drinking  . Hemorrhoids   . Hyperlipidemia   . Hypertension   . Macular degeneration    Right eye  . OSA on CPAP   . Type II diabetes mellitus ([place])    Past Surgical History:  Past Surgical History:  Procedure Laterality Date  . ANTERIOR CERVICAL DECOMP/DISCECTOMY FUSION    . APPENDECTOMY  1935  . BACK SURGERY    . CATARACT EXTRACTION Left   . INGUINAL HERNIA REPAIR  1935  . TONSILLECTOMY     HPI:  Pt is an 82 y.o. male with PMH of diabetes, hypertension, macular degeneration, diverticulosis, HCC,chronic constipation frequent falls presented to the emergency Department  7/26 with chief complaint of fall and generalized weakness. Initial evaluation concerning for community-acquired pneumonia. CXR showed bilateral hypoinflation, infiltrate in RLL worrisome for PNA, small bilateral pleural effusions, possible aspiration. Pt admitted to some throat clearing while eating and drinking. Head CT showed pt appears to be status post laminectomy of C3, C4, C5, C6 and C7. Extensive degenerative changes seen involving posterior facet joints at these levels. Mild degenerative disc disease is noted at C6-7 and C7-T1. Anterior osteophyte formation is noted at multiple levels.Bedside swallow eval ordered to further assess swallowing/ risk for aspiration.   Assessment / Plan / Recommendation Clinical Impression  Pt with no immediate overt s/s of aspiration but did have a delayed throat clear x1 after trial of puree consistency along with slight change  in vocal quality following thin liquid trials (baseline very hoarse). Pt reported that voice has had hoarse voice off and on for at least "the past few months". Given this along with hx of ACDF noted in chart and current RLL PNA, there is concern for nerve damage which may be affecting vocal fold adduction and airway protection. Recommend proceeding with MBS to further assess swallow function and determine safest diet/ strategies. Plan for MBS this afternoon.    Aspiration Risk  Mild aspiration risk    Diet Recommendation Regular;Thin liquid (further recommendations TBD)   Liquid Administration via: Cup;Straw Medication Administration: Whole meds with liquid Supervision: Patient able to self feed;Intermittent supervision to cue for compensatory strategies Compensations: Slow rate;Small sips/bites Postural Changes: Seated upright at 90 degrees    Other  Recommendations Oral Care Recommendations: Oral care BID   Follow up Recommendations  Other (comment) (TBD)    Frequency and Duration            Prognosis        Swallow Study   General HPI: Pt is an 82 y.o. male with PMH of diabetes, hypertension, macular degeneration, diverticulosis, HCC,chronic constipation frequent falls presented to the emergency Department  7/26 with chief complaint of fall and generalized weakness. Initial evaluation concerning for community-acquired pneumonia. CXR showed bilateral hypoinflation, infiltrate in RLL worrisome for PNA, small bilateral pleural effusions, possible aspiration. Pt admitted to some throat clearing while eating and drinking. Head CT showed pt appears to be status post laminectomy of C3, C4, C5, C6 and C7. Extensive degenerative changes seen involving  posterior facet joints at these levels. Mild degenerative disc disease is noted at C6-7 and C7-T1. Anterior osteophyte formation is noted at multiple levels.Bedside swallow eval ordered to further assess swallowing/ risk for aspiration. Type of  Study: Bedside Swallow Evaluation Previous Swallow Assessment: none on record Diet Prior to this Study: Regular;Thin liquids Temperature Spikes Noted: No Respiratory Status: Nasal cannula History of Recent Intubation: No Behavior/Cognition: Alert;Cooperative;Pleasant mood Oral Cavity Assessment: Within Functional Limits Oral Care Completed by SLP: No Oral Cavity - Dentition: Adequate natural dentition;Poor condition Vision: Functional for self-feeding Self-Feeding Abilities: Able to feed self Patient Positioning: Upright in bed Baseline Vocal Quality: Breathy;Hoarse;Suspected CN X (Vagus) involvement Volitional Cough: Strong Volitional Swallow: Able to elicit    Oral/Motor/Sensory Function Overall Oral Motor/Sensory Function: Within functional limits   Ice Chips Ice chips: Not tested   Thin Liquid Thin Liquid: Impaired Presentation: Straw Pharyngeal  Phase Impairments: Wet Vocal Quality    Nectar Thick Nectar Thick Liquid: Not tested   Honey Thick Honey Thick Liquid: Not tested   Puree Puree: Impaired Presentation: Self Fed;Spoon Pharyngeal Phase Impairments: Multiple swallows;Throat Clearing - Delayed   Solid   GO   Solid: Within functional limits Presentation: Self Fed    Functional Assessment Tool Used: clinical judgment Functional Limitations: Swallowing Swallow Current Status [id number]): At least 1 percent but less than 20 percent impaired, limited or restricted Swallow Goal Status [phone]): At least 1 percent but less than 20 percent impaired, limited or restricted   Kern Reap, MA, CCC-SLP 03/04/2016,12:53 PM [phone]

## 2016-03-04 NOTE — Progress Notes (Signed)
PT Cancellation Note  Patient Details Name: Nathan Cunningham MRN: [id number] DOB: 1925/07/29   Cancelled Treatment:    Reason Eval/Treat Not Completed: Patient at procedure or test/unavailable--Currently leaving for procedure. Will check back another time-likely tomorrow.    Weston Anna, MPT Pager: [phone]

## 2016-03-04 NOTE — Progress Notes (Signed)
*  PRELIMINARY RESULTS* Echocardiogram 2D Echocardiogram has been performed.  Nathan Cunningham 03/04/2016, 10:49 AM

## 2016-03-04 NOTE — Progress Notes (Signed)
MBSS complete. Full report located under chart review in imaging section.  Recommend: continue regular diet/ thin liquids, meds whole with liquid, intermittent supervision- cue small bites/ sips, swallow 2x with liquids.   Blair Heys, [place], [place]

## 2016-03-04 NOTE — Progress Notes (Signed)
PT Cancellation Note  Patient Details Name: Nathan Cunningham MRN: [id number] DOB: May 22, 1925   Cancelled Treatment:    Reason Eval/Treat Not Completed: Attempted PT tx session-pt awaiting assistance from nursing. Pt politely requested PT check back another time. Will check back as schedule allows.    Weston Anna, MPT Pager: [phone]

## 2016-03-04 NOTE — Care Management Obs Status (Signed)
[facility] NOTIFICATION   Patient Details  Name: Nathan Cunningham MRN: [id number] Date of Birth: 04-27-1925   Medicare Observation Status Notification Given:  Yes    Carles Collet, RN 03/04/2016, 1:32 PM

## 2016-03-05 DIAGNOSIS — K649 Unspecified hemorrhoids: Secondary | ICD-10-CM | POA: Diagnosis present

## 2016-03-05 LAB — GLUCOSE, CAPILLARY
GLUCOSE-CAPILLARY: 136 mg/dL — AB (ref 65–99)
GLUCOSE-CAPILLARY: 139 mg/dL — AB (ref 65–99)
GLUCOSE-CAPILLARY: 157 mg/dL — AB (ref 65–99)
Glucose-Capillary: 127 mg/dL — ABNORMAL HIGH (ref 65–99)

## 2016-03-05 LAB — URINE CULTURE: Culture: >=100000 — AB

## 2016-03-05 LAB — LEGIONELLA PNEUMOPHILA SEROGP 1 UR AG: L. pneumophila Serogp 1 Ur Ag: NEGATIVE

## 2016-03-05 MED ORDER — HYDROCORTISONE ACETATE 25 MG RE SUPP
25.0000 mg | Freq: Two times a day (BID) | RECTAL | Status: DC
  Administered 2016-03-05 – 2016-03-08 (×6): 25 mg via RECTAL
  Filled 2016-03-05 (×6): qty 1

## 2016-03-05 MED ORDER — SALINE SPRAY 0.65 % NA SOLN
1.0000 | NASAL | Status: DC | PRN
  Administered 2016-03-05: 1 via NASAL
  Filled 2016-03-05: qty 44

## 2016-03-05 MED ORDER — PRAMOXINE HCL 1 % RE FOAM
Freq: Three times a day (TID) | RECTAL | Status: DC | PRN
Start: 2016-03-05 — End: 2016-03-08
  Filled 2016-03-05: qty 15

## 2016-03-05 NOTE — Progress Notes (Signed)
Speech Language Pathology Treatment: Dysphagia  Patient Details Name: Nathan Cunningham MRN: [id number] DOB: 01-18-1925 Today's Date: 03/05/2016 Time: [id number] SLP Time Calculation (min) (ACUTE ONLY): 11 min  Assessment / Plan / Recommendation Clinical Impression  Reviewed findings of MBS yesterday with the pt who verbalized understanding. Pt return demonstrated second swallow with water and coffee, occasional throat clear still observed, though not indicative of aspiration. Pt may continue regular diet and thin liquids, no f/u needed, education complete.    HPI HPI: Pt is an 82 y.o. male with PMH of diabetes, hypertension, macular degeneration, diverticulosis, HCC,chronic constipation frequent falls presented to the emergency Department  7/26 with chief complaint of fall and generalized weakness. Initial evaluation concerning for community-acquired pneumonia. CXR showed bilateral hypoinflation, infiltrate in RLL worrisome for PNA, small bilateral pleural effusions, possible aspiration. Pt admitted to some throat clearing while eating and drinking. Head CT showed pt appears to be status post laminectomy of C3, C4, C5, C6 and C7. Extensive degenerative changes seen involving posterior facet joints at these levels. Mild degenerative disc disease is noted at C6-7 and C7-T1. Anterior osteophyte formation is noted at multiple levels.Bedside swallow eval ordered to further assess swallowing/ risk for aspiration.      SLP Plan  Continue with current plan of care     Recommendations  Diet recommendations: Regular;Thin liquid Liquids provided via: Cup;Straw Medication Administration: Whole meds with liquid Supervision: Patient able to self feed Compensations: Slow rate;Small sips/bites;Multiple dry swallows after each bite/sip Postural Changes and/or Swallow Maneuvers: Seated upright 90 degrees             Oral Care Recommendations: Oral care BID Follow up Recommendations: None Plan: Continue  with current plan of care     [place] Terion Hedman, MA CCC-SLP [id number]  Lynann Beaver 03/05/2016, 10:50 AM

## 2016-03-05 NOTE — Progress Notes (Addendum)
Pt reported that he was feeling SOB. SpO2:92-94 on 2l.O2 as increased to 3L. RT was called to give PRN breathing . Per RT's note, pt told the therapist the therapist that his nares were dry. He added humidifier to his oxygen. Upon re assessment, pt says he was feeling better and went back to sleep. Will continue to monitor.   Pt called out later to report one nostril being blocked. [name], NP was paged to ask for nasal spray. Will continue to monitor.

## 2016-03-05 NOTE — Progress Notes (Signed)
PT Cancellation Note  Patient Details Name: Nathan Cunningham MRN: [id number] DOB: 17-Jan-1925   Cancelled Treatment:    Reason Eval/Treat Not Completed: Fatigue/lethargy limiting ability to participate.  Patient politely declined PT today.  Will return tomorrow for PT evaluation.   Despina Pole 03/05/2016, 4:23 PM Carita Pian. Sanjuana Kava, [place] Pager [phone]

## 2016-03-05 NOTE — Progress Notes (Signed)
Progress Note    Nathan Cunningham  [id number] DOB: February 05, 1925  DOA: 03/03/2016 PCP: Arnette Norris, MD    Brief Narrative:   Nathan Cunningham is an 82 y.o. male with a PMH significant for diabetes, hypertension, macular degeneration, diverticulosis, HCC, chronic constipation and frequent falls Who was admitted 03/03/16 with chief complaint of generalized weakness with frequent falls. Initial concerns were for pneumonia. He was afebrile but had hypoxia with oxygen saturations dropping into the 88% range with movement. Chest x-ray showed a right lower lobe infiltrate.  Assessment/Plan:   Principal Problem:   CAP (community acquired pneumonia) with acute hypoxic respiratory failure/acute respiratory distress Patient was admitted and placed on oxygen supplementation. Empiric Unasyn was started to cover aspiration pathogens. Blood cultures/sputum cultures were sent. Continue nebulizers as needed.Evaluated by speech therapist, continue diet per recommendations. Strep pneumonia urinary antigen was negative. Follow-up Legionella antigen testing. No leukocytosis or lactic acidosis.  Active Problems:   Hemorrhoids Start hydrocortisone suppositories and Proctofoam PRN.    Diabetes mellitus with complication ([place]) On oral hypoglycemics at home. Currently being managed with moderate scale SSI. Hemoglobin A1c 6.2%. CBGs 120-148.    HTN (hypertension) Continue Avapro.    Constipation Chronic problem. Given a milk and molasses enema with good results.  Continue bowel regimen with daily MiraLAX.     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 Folate WNL. B-12 360. Findings consistent with anemia of chronic disease in the setting of a myelomonocytic leukemia.    Frequent falls/physical deconditioning and cervical disc disorder with myelopathy Consider discontinuing Elavil.    Decubitus ulcer of buttock, stage  2 Continue wound care with Santyl.    E. Coli UTI (lower urinary tract infection) Urinalysis concerning for UTI. On empiric antibiotics for CAP.   Family Communication/Anticipated D/C date and plan/Code Status   DVT prophylaxis: Lovenox ordered. Code Status: Full Code.  Family Communication: No family at the bedside. Disposition Plan: Home in 24-48 hours depending on progress.   Medical Consultants:    None.   Procedures:    None  Anti-Infectives:   Rocephin/Azithromcyin x 1 03/03/16 Unasyn 03/03/16--->  Subjective:    Nathan Cunningham continues to report a dry cough, non-productive.  Continues to report rectal pain/itching. No dyspnea.  Appetite fair.    Objective:    Vitals:   03/04/16 2223 03/05/16 0136 03/05/16 0500 03/05/16 0610  BP: (!) 144/62   (!) 150/73  Pulse: 79 84  80  Resp: (!) 22   20  Temp: 98.1 F (36.7 C)   97.8 F (36.6 C)  TempSrc: Oral     SpO2: 94% 92%  95%  Weight:   92.7 kg (204 lb 4.8 oz)   Height:        Intake/Output Summary (Last 24 hours) at 03/05/16 1024 Last data filed at 03/05/16 0734  Gross per 24 hour  Intake              600 ml  Output             3200 ml  Net            -2600 ml   Filed Weights   03/04/16 0532 03/05/16 0500  Weight: 91.8 kg (202 lb 4.8 oz) 92.7 kg (204 lb 4.8 oz)    Exam: General exam: Appears calm and comfortable.  Respiratory system: Clear to auscultation, but diminished in the bases. Respiratory effort normal. Cardiovascular  system: S1 & S2 heard, RRR. No JVD,  rubs, gallops or clicks. No murmurs. Gastrointestinal system: Abdomen is nondistended, soft and nontender. No organomegaly or masses felt. Normal bowel sounds heard. Central nervous system: Alert and oriented. No focal neurological deficits. Extremities: No clubbing,  or cyanosis. No edema. Skin: Sacral decubitus as described by WOC. Psychiatry: Judgement and insight appear normal. Mood & affect appropriate.   Data Reviewed:   I  have personally reviewed following labs and imaging studies:  Labs: Basic Metabolic Panel:  Recent Labs Lab 03/03/16 0909 03/04/16 0523  NA 134* 136  K 4.5 4.3  CL 100* 102  CO2 29 30  GLUCOSE 148* 143*  BUN 16 8  CREATININE 0.69 0.66  CALCIUM 8.8* 8.4*   GFR Estimated Creatinine Clearance: 70.5 mL/min (by C-G formula based on SCr of 0.8 mg/dL). Liver Function Tests:  Recent Labs Lab 03/03/16 0909  AST 12*  ALT 13*  ALKPHOS 51  BILITOT 0.6  PROT 6.5  ALBUMIN 2.8*   CBC:  Recent Labs Lab 03/03/16 0909 03/04/16 0523  WBC 8.2 7.2  NEUTROABS 3.8  --   HGB 10.9* 10.6*  HCT 33.4* 33.3*  MCV 93.6 93.5  PLT 117* 118*   Cardiac Enzymes:  Recent Labs Lab 03/03/16 0909  CKTOTAL 81   CBG:  Recent Labs Lab 03/04/16 0759 03/04/16 1205 03/04/16 1626 03/04/16 2123 03/05/16 0805  GLUCAP 120* 148* 133* 129* 136*   Hgb A1c:  Recent Labs  03/03/16 1146  HGBA1C 6.2*   Anemia work up:  Recent Labs  03/03/16 1346  VITAMINB12 360  FOLATE 32.9  FERRITIN 80  TIBC 270  IRON 68  RETICCTPCT 1.9   Sepsis Labs:  Recent Labs Lab 03/03/16 0909 03/03/16 1151 03/04/16 0523  WBC 8.2  --  7.2  LATICACIDVEN  --  0.71  --    Urine analysis:    Component Value Date/Time   COLORURINE YELLOW 03/03/2016 1052   APPEARANCEUR CLOUDY (A) 03/03/2016 1052   LABSPEC 1.015 03/03/2016 1052   PHURINE 6.0 03/03/2016 1052   GLUCOSEU NEGATIVE 03/03/2016 1052   HGBUR TRACE (A) 03/03/2016 1052   BILIRUBINUR NEGATIVE 03/03/2016 1052   BILIRUBINUR negative 03/19/2014 1631   KETONESUR NEGATIVE 03/03/2016 1052   PROTEINUR NEGATIVE 03/03/2016 1052   UROBILINOGEN 0.2 03/19/2014 1631   UROBILINOGEN 0.2 03/18/2008 0800   NITRITE POSITIVE (A) 03/03/2016 1052   LEUKOCYTESUR LARGE (A) 03/03/2016 1052   Microbiology Recent Results (from the past 240 hour(s))  Urine culture     Status: Abnormal   Collection Time: 03/03/16 10:52 AM  Result Value Ref Range Status   Specimen  Description URINE, RANDOM  Final   Special Requests NONE  Final   Culture >=100,000 COLONIES/mL ESCHERICHIA COLI (A)  Final   Report Status 03/05/2016 FINAL  Final   Organism ID, Bacteria ESCHERICHIA COLI (A)  Final      Susceptibility   Escherichia coli - MIC*    AMPICILLIN <=2 SENSITIVE Sensitive     CEFAZOLIN <=4 SENSITIVE Sensitive     CEFTRIAXONE <=1 SENSITIVE Sensitive     CIPROFLOXACIN <=0.25 SENSITIVE Sensitive     GENTAMICIN <=1 SENSITIVE Sensitive     IMIPENEM <=0.25 SENSITIVE Sensitive     NITROFURANTOIN <=16 SENSITIVE Sensitive     TRIMETH/SULFA <=20 SENSITIVE Sensitive     AMPICILLIN/SULBACTAM <=2 SENSITIVE Sensitive     PIP/TAZO <=4 SENSITIVE Sensitive     * >=100,000 COLONIES/mL ESCHERICHIA COLI  Blood culture (routine x 2)  Status: None (Preliminary result)   Collection Time: 03/03/16 11:20 AM  Result Value Ref Range Status   Specimen Description BLOOD RIGHT ANTECUBITAL  Final   Special Requests BOTTLES DRAWN AEROBIC AND ANAEROBIC 5CC  Final   Culture NO GROWTH 1 DAY  Final   Report Status PENDING  Incomplete  Blood culture (routine x 2)     Status: None (Preliminary result)   Collection Time: 03/03/16 11:26 AM  Result Value Ref Range Status   Specimen Description BLOOD LEFT FOREARM  Final   Special Requests BOTTLES DRAWN AEROBIC AND ANAEROBIC  5CC  Final   Culture NO GROWTH 1 DAY  Final   Report Status PENDING  Incomplete    Radiology: Dg Swallowing Func-speech Pathology  Result Date: 03/04/2016 Objective Swallowing Evaluation: Type of Study: MBS-Modified Barium Swallow Study Patient Details Name: ARMEN LUST MRN: [id number] Date of Birth: 1924/09/14 Today's Date: 03/04/2016 Time: SLP Start Time (ACUTE ONLY): 1340-SLP Stop Time (ACUTE ONLY): 1400 SLP Time Calculation (min) (ACUTE ONLY): 20 min Past Medical History: Past Medical History: Diagnosis Date . Adenomatous polyp of colon 03/2003 . Anemia  . Constipation  . Diverticulosis  . Dizziness  . ETOH abuse    binge drinking . Hemorrhoids  . Hyperlipidemia  . Hypertension  . Macular degeneration   Right eye . OSA on CPAP  . Type II diabetes mellitus ([place])  Past Surgical History: Past Surgical History: Procedure Laterality Date . ANTERIOR CERVICAL DECOMP/DISCECTOMY FUSION   . APPENDECTOMY  1935 . BACK SURGERY   . CATARACT EXTRACTION Left  . INGUINAL HERNIA REPAIR  1935 . TONSILLECTOMY   HPI: Pt is an 82 y.o. male with PMH of diabetes, hypertension, macular degeneration, diverticulosis, HCC,chronic constipation frequent falls presented to the emergency Department  7/26 with chief complaint of fall and generalized weakness. Initial evaluation concerning for community-acquired pneumonia. CXR showed bilateral hypoinflation, infiltrate in RLL worrisome for PNA, small bilateral pleural effusions, possible aspiration. Pt admitted to some throat clearing while eating and drinking. Head CT showed pt appears to be status post laminectomy of C3, C4, C5, C6 and C7. Extensive degenerative changes seen involving posterior facet joints at these levels. Mild degenerative disc disease is noted at C6-7 and C7-T1. Anterior osteophyte formation is noted at multiple levels.Bedside swallow eval ordered to further assess swallowing/ risk for aspiration. No Data Recorded Assessment / Plan / Recommendation CHL IP CLINICAL IMPRESSIONS 03/04/2016 Therapy Diagnosis Mild oral phase dysphagia Clinical Impression Pt with a very mild oral dysphagia with piecemeal swallowing and inconsistent premature spill of liquids to the level of the vallecula along with minimal oral residuals which pool into the vallecula post-swallow. Pt did not consistently sense liquid residuals but cleared when cued to swallow again. Pharyngeal swallow timely. Aspiration risk appears mild at this time. Recommend that pt continue regular diet consistency, thin liquids, meds whole with liquid, swallow 2 times with liquids, intermittent supervision to ensure pt taking small bites/  sips and swallowing 2 times. Reviewed results/ compensatory strategies with pt who is in agreement with recommendations. Will f/u x1 to ensure diet tolerance and review compensatory strategies.  Impact on safety and function Mild aspiration risk   CHL IP TREATMENT RECOMMENDATION 03/04/2016 Treatment Recommendations Therapy as outlined in treatment plan below   No flowsheet data found. CHL IP DIET RECOMMENDATION 03/04/2016 SLP Diet Recommendations Regular solids;Thin liquid Liquid Administration via Cup;Straw Medication Administration Whole meds with liquid Compensations Slow rate;Small sips/bites;Multiple dry swallows after each bite/sip Postural Changes Seated upright at 90  degrees   CHL IP OTHER RECOMMENDATIONS 03/04/2016 Recommended Consults -- Oral Care Recommendations Oral care BID Other Recommendations --   CHL IP FOLLOW UP RECOMMENDATIONS 03/04/2016 Follow up Recommendations None   CHL IP FREQUENCY AND DURATION 03/04/2016 Speech Therapy Frequency (ACUTE ONLY) min 1 x/week Treatment Duration 1 week      CHL IP ORAL PHASE 03/04/2016 Oral Phase Impaired Oral - Pudding Teaspoon -- Oral - Pudding Cup -- Oral - Honey Teaspoon -- Oral - Honey Cup -- Oral - Nectar Teaspoon -- Oral - Nectar Cup -- Oral - Nectar Straw -- Oral - Thin Teaspoon -- Oral - Thin Cup Piecemeal swallowing;Premature spillage Oral - Thin Straw Premature spillage;Piecemeal swallowing Oral - Puree WFL Oral - Mech Soft -- Oral - Regular WFL Oral - Multi-Consistency -- Oral - Pill WFL Oral Phase - Comment --  CHL IP PHARYNGEAL PHASE 03/04/2016 Pharyngeal Phase Impaired Pharyngeal- Pudding Teaspoon -- Pharyngeal -- Pharyngeal- Pudding Cup -- Pharyngeal -- Pharyngeal- Honey Teaspoon -- Pharyngeal -- Pharyngeal- Honey Cup -- Pharyngeal -- Pharyngeal- Nectar Teaspoon -- Pharyngeal -- Pharyngeal- Nectar Cup -- Pharyngeal -- Pharyngeal- Nectar Straw -- Pharyngeal -- Pharyngeal- Thin Teaspoon -- Pharyngeal -- Pharyngeal- Thin Cup WFL Pharyngeal -- Pharyngeal-  Thin Straw Delayed swallow initiation-vallecula;Pharyngeal residue - valleculae Pharyngeal -- Pharyngeal- Puree WFL Pharyngeal -- Pharyngeal- Mechanical Soft -- Pharyngeal -- Pharyngeal- Regular WFL Pharyngeal -- Pharyngeal- Multi-consistency -- Pharyngeal -- Pharyngeal- Pill WFL Pharyngeal -- Pharyngeal Comment --  CHL IP CERVICAL ESOPHAGEAL PHASE 03/04/2016 Cervical Esophageal Phase WFL Pudding Teaspoon -- Pudding Cup -- Honey Teaspoon -- Honey Cup -- Nectar Teaspoon -- Nectar Cup -- Nectar Straw -- Thin Teaspoon -- Thin Cup -- Thin Straw -- Puree -- Mechanical Soft -- Regular -- Multi-consistency -- Pill -- Cervical Esophageal Comment -- CHL IP GO 03/04/2016 Functional Assessment Tool Used clinical judgment Functional Limitations Swallowing Swallow Current Status [id number]) CI Swallow Goal Status [id number]) CI Swallow Discharge Status [id number]) (None) Motor Speech Current Status [id number]) (None) Motor Speech Goal Status [id number]) (None) Motor Speech Goal Status [id number]) (None) Spoken Language Comprehension Current Status [id number]) (None) Spoken Language Comprehension Goal Status [id number]) (None) Spoken Language Comprehension Discharge Status [id number]) (None) Spoken Language Expression Current Status [id number]) (None) Spoken Language Expression Goal Status [id number]) (None) Spoken Language Expression Discharge Status [phone]) (None) Attention Current Status [id number]) (None) Attention Goal Status [id number]) (None) Attention Discharge Status [id number]) (None) Memory Current Status [id number]) (None) Memory Goal Status [id number]) (None) Memory Discharge Status [id number]) (None) Voice Current Status [id number]) (None) Voice Goal Status [id number]) (None) Voice Discharge Status [id number]) (None) Other Speech-Language Pathology Functional Limitation [id number]) (None) Other Speech-Language Pathology Functional Limitation Goal Status [id number]) (None) Other Speech-Language Pathology Functional Limitation Discharge Status [phone]) (None) Kern Reap, MA, CCC-SLP 03/04/2016, 2:26 PM  [phone]              Medications:   . amitriptyline  75 mg Oral QHS  . ampicillin-sulbactam (UNASYN) IV  3 g Intravenous Q6H  . aspirin  162 mg Oral Daily  . collagenase   Topical Daily  . docusate sodium  100 mg Oral BID  . dorzolamide-timolol  1 drop Both Eyes BID  . enoxaparin (LOVENOX) injection  40 mg Subcutaneous Q24H  . insulin aspart  0-15 Units Subcutaneous TID WC  . insulin aspart  0-5 Units Subcutaneous QHS  . irbesartan  150 mg Oral Daily  . multivitamin with minerals  1 tablet Oral Daily  . polyethylene glycol  17 g Oral Daily   Continuous Infusions:  Time spent: 25 minutes.   LOS: 1 day   Davaughn Hillyard  [facility] Pager [phone]. If unable to reach me by pager, please call my cell phone at [phone].  *Please refer to [email or web address], password [email or web address] to get updated schedule on who will round on this patient, as hospitalists switch teams weekly. If 7PM-7AM, please contact night-coverage at [email or web address], password [email or web address] for any overnight needs.  03/05/2016, 10:24 AM

## 2016-03-05 NOTE — Progress Notes (Signed)
RT called by RN to give PRN breathing treatment.  Upon entering the room, patient is noted to be in no apparent distress.  Patient complains to RT of dry oral and nasal passages.  Patient denies overt respiratory difficulty.  Lung sounds clear in all fields.  RR: 18.  SpO2 95% on 3 lpm [place].  Humidification added to nasal canula, to which patient expressed some relief.  No need for PRN treatment at this time.

## 2016-03-05 NOTE — Care Management Important Message (Signed)
Important Message  Patient Details  Name: Nathan Cunningham MRN: [id number] Date of Birth: Oct 23, 1924   Medicare Important Message Given:  Yes    Loann Quill 03/05/2016, 10:48 AM

## 2016-03-06 DIAGNOSIS — J189 Pneumonia, unspecified organism: Principal | ICD-10-CM

## 2016-03-06 LAB — GLUCOSE, CAPILLARY
GLUCOSE-CAPILLARY: 144 mg/dL — AB (ref 65–99)
Glucose-Capillary: 141 mg/dL — ABNORMAL HIGH (ref 65–99)
Glucose-Capillary: 116 mg/dL — ABNORMAL HIGH (ref 65–99)
Glucose-Capillary: 131 mg/dL — ABNORMAL HIGH (ref 65–99)
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
## 2016-03-06 NOTE — Progress Notes (Signed)
Progress Note    Nathan Cunningham  [id number] DOB: 06/01/1925  DOA: 03/03/2016 PCP: Arnette Norris, MD    Brief Narrative:   Nathan Cunningham is an 82 y.o. male with a PMH significant for diabetes, hypertension, macular degeneration, diverticulosis, HCC, chronic constipation and frequent falls Who was admitted 03/03/16 with chief complaint of generalized weakness with frequent falls. Initial concerns were for pneumonia. He was afebrile but had hypoxia with oxygen saturations dropping into the 88% range with movement. Chest x-ray showed a right lower lobe infiltrate.  Assessment/Plan:   Principal Problem:   CAP (community acquired pneumonia) with acute hypoxic respiratory failure/acute respiratory distress Patient was admitted and placed on oxygen supplementation. Empiric Unasyn was started to cover aspiration pathogens. Blood cultures no growth so far, sputum not collected, . Continue nebulizers as needed.Evaluated by speech therapist, continue regular diet /thin liquid per recommendations. Strep pneumonia urinary antigen was negative. Legionella antigen negative. No fever, no leukocytosis or lactic acidosis.   E. Coli UTI (lower urinary tract infection) ecoli pansensitive, patient did have lower abdominal initially on presentation, now the pain has resolved.  On empiric antibiotics for CAP.   Active Problems:    Diabetes mellitus with complication ([place]) On oral hypoglycemics at home. Currently being managed with moderate scale SSI. Hemoglobin A1c 6.2%. CBGs 120-148.    HTN (hypertension) Continue Avapro.    Constipation Chronic problem. Given a milk and molasses enema with good results.  Continue bowel regimen with daily MiraLAX.    Hemorrhoids Start hydrocortisone suppositories and Proctofoam PRN.    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 Folate WNL. B-12 360. Findings consistent with anemia of  chronic disease in the setting of a myelomonocytic leukemia.    Frequent falls/physical deconditioning and cervical disc disorder with myelopathy Consider discontinuing Elavil.    Decubitus ulcer of buttock, stage 2 Continue wound care with Santyl.      Family Communication/Anticipated D/C date and plan/Code Status   DVT prophylaxis: Lovenox ordered. Code Status: Full Code.  Family Communication: No family at the bedside. Disposition Plan: Home with home health vs SNF in 24-48 hours depending on progress.   Medical Consultants:    None.   Procedures:    None  Anti-Infectives:   Rocephin/Azithromcyin x 1 03/03/16 Unasyn 03/03/16--->  Subjective:    Nathan Cunningham report feeling much better today, no cough during encounter, continues to report a dry cough, denies sob, currently on 2liter oxygen. rectal pain/itching has resolved.     Objective:    Vitals:   03/05/16 0610 03/05/16 2136 03/06/16 0617 03/06/16 0930  BP: (!) 150/73 135/67 124/60 (!) 149/75  Pulse: 80 74 79 82  Resp: 20 16 18    Temp: 97.8 F (36.6 C) 98.5 F (36.9 C) 98.2 F (36.8 C)   TempSrc:  Oral Oral   SpO2: 95% 94% 93%   Weight:   91.8 kg (202 lb 6.1 oz)   Height:        Intake/Output Summary (Last 24 hours) at 03/06/16 1321 Last data filed at 03/06/16 1003  Gross per 24 hour  Intake              400 ml  Output             3000 ml  Net            -2600 ml   Filed Weights   03/04/16 0532 03/05/16 0500  03/06/16 0617  Weight: 91.8 kg (202 lb 4.8 oz) 92.7 kg (204 lb 4.8 oz) 91.8 kg (202 lb 6.1 oz)    Exam: General exam: Appears calm and comfortable.  Respiratory system: Clear to auscultation, but diminished in the bases. Respiratory effort normal. Cardiovascular system: S1 & S2 heard, RRR. No JVD,  rubs, gallops or clicks. No murmurs. Gastrointestinal system: Abdomen is nondistended, soft and nontender. No organomegaly or masses felt. Normal bowel sounds heard. Central nervous  system: Alert and oriented. No focal neurological deficits. Extremities: No clubbing,  or cyanosis. No edema. Skin: Sacral decubitus as described by WOC. Psychiatry: Judgement and insight appear normal. Mood & affect appropriate.   Data Reviewed:   I have personally reviewed following labs and imaging studies:  Labs: Basic Metabolic Panel:  Recent Labs Lab 03/03/16 0909 03/04/16 0523  NA 134* 136  K 4.5 4.3  CL 100* 102  CO2 29 30  GLUCOSE 148* 143*  BUN 16 8  CREATININE 0.69 0.66  CALCIUM 8.8* 8.4*   GFR Estimated Creatinine Clearance: 70.2 mL/min (by C-G formula based on SCr of 0.8 mg/dL). Liver Function Tests:  Recent Labs Lab 03/03/16 0909  AST 12*  ALT 13*  ALKPHOS 51  BILITOT 0.6  PROT 6.5  ALBUMIN 2.8*   CBC:  Recent Labs Lab 03/03/16 0909 03/04/16 0523  WBC 8.2 7.2  NEUTROABS 3.8  --   HGB 10.9* 10.6*  HCT 33.4* 33.3*  MCV 93.6 93.5  PLT 117* 118*   Cardiac Enzymes:  Recent Labs Lab 03/03/16 0909  CKTOTAL 81   CBG:  Recent Labs Lab 03/05/16 1218 03/05/16 1612 03/05/16 2130 03/06/16 0819 03/06/16 1149  GLUCAP 127* 139* 157* 144* 141*   Hgb A1c: No results for input(s): HGBA1C in the last 72 hours. Anemia work up:  Recent Labs  03/03/16 1346  VITAMINB12 360  FOLATE 32.9  FERRITIN 80  TIBC 270  IRON 68  RETICCTPCT 1.9   Sepsis Labs:  Recent Labs Lab 03/03/16 0909 03/03/16 1151 03/04/16 0523  WBC 8.2  --  7.2  LATICACIDVEN  --  0.71  --    Urine analysis:    Component Value Date/Time   COLORURINE YELLOW 03/03/2016 1052   APPEARANCEUR CLOUDY (A) 03/03/2016 1052   LABSPEC 1.015 03/03/2016 1052   PHURINE 6.0 03/03/2016 1052   GLUCOSEU NEGATIVE 03/03/2016 1052   HGBUR TRACE (A) 03/03/2016 1052   BILIRUBINUR NEGATIVE 03/03/2016 1052   BILIRUBINUR negative 03/19/2014 1631   KETONESUR NEGATIVE 03/03/2016 1052   PROTEINUR NEGATIVE 03/03/2016 1052   UROBILINOGEN 0.2 03/19/2014 1631   UROBILINOGEN 0.2 03/18/2008  0800   NITRITE POSITIVE (A) 03/03/2016 1052   LEUKOCYTESUR LARGE (A) 03/03/2016 1052   Microbiology Recent Results (from the past 240 hour(s))  Urine culture     Status: Abnormal   Collection Time: 03/03/16 10:52 AM  Result Value Ref Range Status   Specimen Description URINE, RANDOM  Final   Special Requests NONE  Final   Culture >=100,000 COLONIES/mL ESCHERICHIA COLI (A)  Final   Report Status 03/05/2016 FINAL  Final   Organism ID, Bacteria ESCHERICHIA COLI (A)  Final      Susceptibility   Escherichia coli - MIC*    AMPICILLIN <=2 SENSITIVE Sensitive     CEFAZOLIN <=4 SENSITIVE Sensitive     CEFTRIAXONE <=1 SENSITIVE Sensitive     CIPROFLOXACIN <=0.25 SENSITIVE Sensitive     GENTAMICIN <=1 SENSITIVE Sensitive     IMIPENEM <=0.25 SENSITIVE Sensitive  NITROFURANTOIN <=16 SENSITIVE Sensitive     TRIMETH/SULFA <=20 SENSITIVE Sensitive     AMPICILLIN/SULBACTAM <=2 SENSITIVE Sensitive     PIP/TAZO <=4 SENSITIVE Sensitive     * >=100,000 COLONIES/mL ESCHERICHIA COLI  Blood culture (routine x 2)     Status: None (Preliminary result)   Collection Time: 03/03/16 11:20 AM  Result Value Ref Range Status   Specimen Description BLOOD RIGHT ANTECUBITAL  Final   Special Requests BOTTLES DRAWN AEROBIC AND ANAEROBIC 5CC  Final   Culture NO GROWTH 3 DAYS  Final   Report Status PENDING  Incomplete  Blood culture (routine x 2)     Status: None (Preliminary result)   Collection Time: 03/03/16 11:26 AM  Result Value Ref Range Status   Specimen Description BLOOD LEFT FOREARM  Final   Special Requests BOTTLES DRAWN AEROBIC AND ANAEROBIC  5CC  Final   Culture NO GROWTH 3 DAYS  Final   Report Status PENDING  Incomplete    Radiology: Dg Swallowing Func-speech Pathology  Result Date: 03/04/2016 Objective Swallowing Evaluation: Type of Study: MBS-Modified Barium Swallow Study Patient Details Name: WETZEL MUHS MRN: [id number] Date of Birth: 01-03-1925 Today's Date: 03/04/2016 Time: SLP Start  Time (ACUTE ONLY): 1340-SLP Stop Time (ACUTE ONLY): 1400 SLP Time Calculation (min) (ACUTE ONLY): 20 min Past Medical History: Past Medical History: Diagnosis Date . Adenomatous polyp of colon 03/2003 . Anemia  . Constipation  . Diverticulosis  . Dizziness  . ETOH abuse   binge drinking . Hemorrhoids  . Hyperlipidemia  . Hypertension  . Macular degeneration   Right eye . OSA on CPAP  . Type II diabetes mellitus ([place])  Past Surgical History: Past Surgical History: Procedure Laterality Date . ANTERIOR CERVICAL DECOMP/DISCECTOMY FUSION   . APPENDECTOMY  1935 . BACK SURGERY   . CATARACT EXTRACTION Left  . INGUINAL HERNIA REPAIR  1935 . TONSILLECTOMY   HPI: Pt is an 82 y.o. male with PMH of diabetes, hypertension, macular degeneration, diverticulosis, HCC,chronic constipation frequent falls presented to the emergency Department  7/26 with chief complaint of fall and generalized weakness. Initial evaluation concerning for community-acquired pneumonia. CXR showed bilateral hypoinflation, infiltrate in RLL worrisome for PNA, small bilateral pleural effusions, possible aspiration. Pt admitted to some throat clearing while eating and drinking. Head CT showed pt appears to be status post laminectomy of C3, C4, C5, C6 and C7. Extensive degenerative changes seen involving posterior facet joints at these levels. Mild degenerative disc disease is noted at C6-7 and C7-T1. Anterior osteophyte formation is noted at multiple levels.Bedside swallow eval ordered to further assess swallowing/ risk for aspiration. No Data Recorded Assessment / Plan / Recommendation CHL IP CLINICAL IMPRESSIONS 03/04/2016 Therapy Diagnosis Mild oral phase dysphagia Clinical Impression Pt with a very mild oral dysphagia with piecemeal swallowing and inconsistent premature spill of liquids to the level of the vallecula along with minimal oral residuals which pool into the vallecula post-swallow. Pt did not consistently sense liquid residuals but cleared when  cued to swallow again. Pharyngeal swallow timely. Aspiration risk appears mild at this time. Recommend that pt continue regular diet consistency, thin liquids, meds whole with liquid, swallow 2 times with liquids, intermittent supervision to ensure pt taking small bites/ sips and swallowing 2 times. Reviewed results/ compensatory strategies with pt who is in agreement with recommendations. Will f/u x1 to ensure diet tolerance and review compensatory strategies.  Impact on safety and function Mild aspiration risk   CHL IP TREATMENT RECOMMENDATION 03/04/2016 Treatment Recommendations Therapy as  outlined in treatment plan below   No flowsheet data found. CHL IP DIET RECOMMENDATION 03/04/2016 SLP Diet Recommendations Regular solids;Thin liquid Liquid Administration via Cup;Straw Medication Administration Whole meds with liquid Compensations Slow rate;Small sips/bites;Multiple dry swallows after each bite/sip Postural Changes Seated upright at 90 degrees   CHL IP OTHER RECOMMENDATIONS 03/04/2016 Recommended Consults -- Oral Care Recommendations Oral care BID Other Recommendations --   CHL IP FOLLOW UP RECOMMENDATIONS 03/04/2016 Follow up Recommendations None   CHL IP FREQUENCY AND DURATION 03/04/2016 Speech Therapy Frequency (ACUTE ONLY) min 1 x/week Treatment Duration 1 week      CHL IP ORAL PHASE 03/04/2016 Oral Phase Impaired Oral - Pudding Teaspoon -- Oral - Pudding Cup -- Oral - Honey Teaspoon -- Oral - Honey Cup -- Oral - Nectar Teaspoon -- Oral - Nectar Cup -- Oral - Nectar Straw -- Oral - Thin Teaspoon -- Oral - Thin Cup Piecemeal swallowing;Premature spillage Oral - Thin Straw Premature spillage;Piecemeal swallowing Oral - Puree WFL Oral - Mech Soft -- Oral - Regular WFL Oral - Multi-Consistency -- Oral - Pill WFL Oral Phase - Comment --  CHL IP PHARYNGEAL PHASE 03/04/2016 Pharyngeal Phase Impaired Pharyngeal- Pudding Teaspoon -- Pharyngeal -- Pharyngeal- Pudding Cup -- Pharyngeal -- Pharyngeal- Honey Teaspoon --  Pharyngeal -- Pharyngeal- Honey Cup -- Pharyngeal -- Pharyngeal- Nectar Teaspoon -- Pharyngeal -- Pharyngeal- Nectar Cup -- Pharyngeal -- Pharyngeal- Nectar Straw -- Pharyngeal -- Pharyngeal- Thin Teaspoon -- Pharyngeal -- Pharyngeal- Thin Cup WFL Pharyngeal -- Pharyngeal- Thin Straw Delayed swallow initiation-vallecula;Pharyngeal residue - valleculae Pharyngeal -- Pharyngeal- Puree WFL Pharyngeal -- Pharyngeal- Mechanical Soft -- Pharyngeal -- Pharyngeal- Regular WFL Pharyngeal -- Pharyngeal- Multi-consistency -- Pharyngeal -- Pharyngeal- Pill WFL Pharyngeal -- Pharyngeal Comment --  CHL IP CERVICAL ESOPHAGEAL PHASE 03/04/2016 Cervical Esophageal Phase WFL Pudding Teaspoon -- Pudding Cup -- Honey Teaspoon -- Honey Cup -- Nectar Teaspoon -- Nectar Cup -- Nectar Straw -- Thin Teaspoon -- Thin Cup -- Thin Straw -- Puree -- Mechanical Soft -- Regular -- Multi-consistency -- Pill -- Cervical Esophageal Comment -- CHL IP GO 03/04/2016 Functional Assessment Tool Used clinical judgment Functional Limitations Swallowing Swallow Current Status [id number]) CI Swallow Goal Status [id number]) CI Swallow Discharge Status [id number]) (None) Motor Speech Current Status [id number]) (None) Motor Speech Goal Status UK:[id number]) (None) Motor Speech Goal Status SA:[id number]) (None) Spoken Language Comprehension Current Status [id number]) (None) Spoken Language Comprehension Goal Status [id number]) (None) Spoken Language Comprehension Discharge Status [id number]) (None) Spoken Language Expression Current Status FP:[id number]) (None) Spoken Language Expression Goal Status [id number]:[id number]) (None) Spoken Language Expression Discharge Status [phone]) (None) Attention Current Status [id number]) (None) Attention Goal Status EY:7266000) (None) Attention Discharge Status [id number]) (None) Memory Current Status YL:3545582) (None) Memory Goal Status CF:[id number]) (None) Memory Discharge Status QC:115444) (None) Voice Current Status [id number]) (None) Voice Goal Status [id number]:8517110) (None) Voice Discharge Status [id number]) (None) Other  Speech-Language Pathology Functional Limitation [id number]:978821) (None) Other Speech-Language Pathology Functional Limitation Goal Status [id number]) (None) Other Speech-Language Pathology Functional Limitation Discharge Status [phone]) (None) Kern Reap, MA, CCC-SLP 03/04/2016, 2:26 PM [phone]              Medications:   . amitriptyline  75 mg Oral QHS  . ampicillin-sulbactam (UNASYN) IV  3 g Intravenous Q6H  . aspirin  162 mg Oral Daily  . collagenase   Topical Daily  . docusate sodium  100 mg Oral BID  . dorzolamide-timolol  1 drop Both Eyes BID  . enoxaparin (LOVENOX) injection  40 mg Subcutaneous Q24H  . hydrocortisone  25 mg Rectal BID  . insulin aspart  0-15 Units Subcutaneous TID WC  . insulin aspart  0-5 Units Subcutaneous QHS  . irbesartan  150 mg Oral Daily  . multivitamin with minerals  1 tablet Oral Daily  . polyethylene glycol  17 g Oral Daily   Continuous Infusions:   Time spent: 25 minutes.   LOS: 2 days   Jamyiah Labella MD PhD  [facility] Pager [phone].   *Please refer to [email or web address], password [email or web address] to get updated schedule on who will round on this patient, as hospitalists switch teams weekly. If 7PM-7AM, please contact night-coverage at [email or web address], password [email or web address] for any overnight needs.  03/06/2016, 1:21 PM

## 2016-03-07 LAB — BASIC METABOLIC PANEL
ANION GAP: 5 (ref 5–15)
BUN: <5 mg/dL — ABNORMAL LOW (ref 6–20)
CO2: 30 mmol/L (ref 22–32)
Calcium: 8.6 mg/dL — ABNORMAL LOW (ref 8.9–10.3)
Chloride: 101 mmol/L (ref 101–111)
Creatinine, Ser: 0.63 mg/dL (ref 0.61–1.24)
GLUCOSE: 136 mg/dL — AB (ref 65–99)
POTASSIUM: 3.8 mmol/L (ref 3.5–5.1)
SODIUM: 136 mmol/L (ref 135–145)

## 2016-03-07 LAB — CBC
HCT: 36.8 % — ABNORMAL LOW (ref 39.0–52.0)
Hemoglobin: 11.5 g/dL — ABNORMAL LOW (ref 13.0–17.0)
MCH: 29.9 pg (ref 26.0–34.0)
MCHC: 31.3 g/dL (ref 30.0–36.0)
MCV: 95.6 fL (ref 78.0–100.0)
PLATELETS: 142 10*3/uL — AB (ref 150–400)
RBC: 3.85 MIL/uL — AB (ref 4.22–5.81)
RDW: 14.6 % (ref 11.5–15.5)
WBC: 7.1 10*3/uL (ref 4.0–10.5)

## 2016-03-07 LAB — GLUCOSE, CAPILLARY
GLUCOSE-CAPILLARY: 119 mg/dL — AB (ref 65–99)
GLUCOSE-CAPILLARY: 133 mg/dL — AB (ref 65–99)
Glucose-Capillary: 135 mg/dL — ABNORMAL HIGH (ref 65–99)
Glucose-Capillary: 144 mg/dL — ABNORMAL HIGH (ref 65–99)

## 2016-03-07 LAB — MAGNESIUM: MAGNESIUM: 1.8 mg/dL (ref 1.7–2.4)

## 2016-03-07 NOTE — Progress Notes (Signed)
Progress Note    Nathan Cunningham  [id number] DOB: May 22, 1925  DOA: 03/03/2016 PCP: Arnette Norris, MD    Brief Narrative:   Nathan Cunningham is an 82 y.o. male with a PMH significant for diabetes, hypertension, macular degeneration, diverticulosis, HCC, chronic constipation and frequent falls Who was admitted 03/03/16 with chief complaint of generalized weakness with frequent falls. Initial concerns were for pneumonia. He was afebrile but had hypoxia with oxygen saturations dropping into the 88% range with movement. Chest x-ray showed a right lower lobe infiltrate.  Assessment/Plan:   Principal Problem:   CAP (community acquired pneumonia) with acute hypoxic respiratory failure/acute respiratory distress Patient was admitted and placed on oxygen supplementation. Empiric Unasyn was started to cover aspiration pathogens. Blood cultures no growth so far, sputum not collected, . Continue nebulizers as needed.Evaluated by speech therapist, continue regular diet /thin liquid per recommendations. Strep pneumonia urinary antigen was negative. Legionella antigen negative. No fever, no leukocytosis or lactic acidosis.   E. Coli UTI (lower urinary tract infection) ecoli pansensitive, patient did have lower abdominal initially on presentation, now the pain has resolved.  On empiric antibiotics for CAP.   Active Problems:    Diabetes mellitus with complication ([place]) On oral hypoglycemics at home. Currently being managed with moderate scale SSI. Hemoglobin A1c 6.2%. CBGs 120-148.    HTN (hypertension) Continue Avapro.    Constipation Chronic problem. Given a milk and molasses enema with good results.  Continue bowel regimen with daily MiraLAX.    Hemorrhoids Start hydrocortisone suppositories and Proctofoam PRN.    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 Folate WNL. B-12 360. Findings consistent with anemia of  chronic disease in the setting of a myelomonocytic leukemia.    Frequent falls/physical deconditioning and cervical disc disorder with myelopathy Consider discontinuing Elavil.    Decubitus ulcer of buttock, stage 2 Continue wound care with Santyl.      Family Communication/Anticipated D/C date and plan/Code Status   DVT prophylaxis: Lovenox ordered. Code Status: Full Code.  Family Communication: wife at the bedside. Disposition Plan:  SNF on Monday if bedavailable.   Medical Consultants:    None.   Procedures:    None  Anti-Infectives:   Rocephin/Azithromcyin x 1 03/03/16 Unasyn 03/03/16--->  Subjective:    Nathan Cunningham report feeling much better today, no cough during encounter, sitting in chair, now he is weaned off from o2, now on room air, rectal pain/itching has resolved.    Wife at bedside  Objective:    Vitals:   03/06/16 1442 03/06/16 1800 03/06/16 2136 03/07/16 0529  BP: (!) 135/120 (!) 157/76 (!) 158/77 (!) 152/80  Pulse: 75 78 82 81  Resp:   16 16  Temp: 98.3 F (36.8 C)  98.3 F (36.8 C) 98.4 F (36.9 C)  TempSrc: Oral  Oral Oral  SpO2: 96%  96% 93%  Weight:      Height:        Intake/Output Summary (Last 24 hours) at 03/07/16 0806 Last data filed at 03/07/16 [id number]  Gross per 24 hour  Intake              840 ml  Output              400 ml  Net              440 ml   Filed Weights   03/04/16 0532 03/05/16 0500 03/06/16 0617  Weight: 91.8 kg (202 lb 4.8 oz) 92.7 kg (204 lb 4.8 oz) 91.8 kg (202 lb 6.1 oz)    Exam: General exam: Appears calm and comfortable.  Respiratory system: Clear to auscultation, but diminished in the bases. Respiratory effort normal. Cardiovascular system: S1 & S2 heard, RRR. No JVD,  rubs, gallops or clicks. No murmurs. Gastrointestinal system: Abdomen is nondistended, soft and nontender. No organomegaly or masses felt. Normal bowel sounds heard. Central nervous system: Alert and oriented. No focal  neurological deficits. Extremities: No clubbing,  or cyanosis. No edema. Skin: Sacral decubitus as described by WOC. Psychiatry: Judgement and insight appear normal. Mood & affect appropriate.   Data Reviewed:   I have personally reviewed following labs and imaging studies:  Labs: Basic Metabolic Panel:  Recent Labs Lab 03/03/16 0909 03/04/16 0523 03/07/16 0614  NA 134* 136 136  K 4.5 4.3 3.8  CL 100* 102 101  CO2 29 30 30   GLUCOSE 148* 143* 136*  BUN 16 8 <5*  CREATININE 0.69 0.66 0.63  CALCIUM 8.8* 8.4* 8.6*  MG  --   --  1.8   GFR Estimated Creatinine Clearance: 70.2 mL/min (by C-G formula based on SCr of 0.8 mg/dL). Liver Function Tests:  Recent Labs Lab 03/03/16 0909  AST 12*  ALT 13*  ALKPHOS 51  BILITOT 0.6  PROT 6.5  ALBUMIN 2.8*   CBC:  Recent Labs Lab 03/03/16 0909 03/04/16 0523 03/07/16 0614  WBC 8.2 7.2 7.1  NEUTROABS 3.8  --   --   HGB 10.9* 10.6* 11.5*  HCT 33.4* 33.3* 36.8*  MCV 93.6 93.5 95.6  PLT 117* 118* 142*   Cardiac Enzymes:  Recent Labs Lab 03/03/16 0909  CKTOTAL 81   CBG:  Recent Labs Lab 03/05/16 2130 03/06/16 0819 03/06/16 1149 03/06/16 1718 03/06/16 2136  GLUCAP 157* 144* 141* 116* 131*   Hgb A1c: No results for input(s): HGBA1C in the last 72 hours. Anemia work up: No results for input(s): VITAMINB12, FOLATE, FERRITIN, TIBC, IRON, RETICCTPCT in the last 72 hours. Sepsis Labs:  Recent Labs Lab 03/03/16 0909 03/03/16 1151 03/04/16 0523 03/07/16 0614  WBC 8.2  --  7.2 7.1  LATICACIDVEN  --  0.71  --   --    Urine analysis:    Component Value Date/Time   COLORURINE YELLOW 03/03/2016 1052   APPEARANCEUR CLOUDY (A) 03/03/2016 1052   LABSPEC 1.015 03/03/2016 1052   PHURINE 6.0 03/03/2016 1052   GLUCOSEU NEGATIVE 03/03/2016 1052   HGBUR TRACE (A) 03/03/2016 1052   BILIRUBINUR NEGATIVE 03/03/2016 1052   BILIRUBINUR negative 03/19/2014 1631   KETONESUR NEGATIVE 03/03/2016 1052   PROTEINUR NEGATIVE  03/03/2016 1052   UROBILINOGEN 0.2 03/19/2014 1631   UROBILINOGEN 0.2 03/18/2008 0800   NITRITE POSITIVE (A) 03/03/2016 1052   LEUKOCYTESUR LARGE (A) 03/03/2016 1052   Microbiology Recent Results (from the past 240 hour(s))  Urine culture     Status: Abnormal   Collection Time: 03/03/16 10:52 AM  Result Value Ref Range Status   Specimen Description URINE, RANDOM  Final   Special Requests NONE  Final   Culture >=100,000 COLONIES/mL ESCHERICHIA COLI (A)  Final   Report Status 03/05/2016 FINAL  Final   Organism ID, Bacteria ESCHERICHIA COLI (A)  Final      Susceptibility   Escherichia coli - MIC*    AMPICILLIN <=2 SENSITIVE Sensitive     CEFAZOLIN <=4 SENSITIVE Sensitive     CEFTRIAXONE <=1 SENSITIVE Sensitive     CIPROFLOXACIN <=0.25  SENSITIVE Sensitive     GENTAMICIN <=1 SENSITIVE Sensitive     IMIPENEM <=0.25 SENSITIVE Sensitive     NITROFURANTOIN <=16 SENSITIVE Sensitive     TRIMETH/SULFA <=20 SENSITIVE Sensitive     AMPICILLIN/SULBACTAM <=2 SENSITIVE Sensitive     PIP/TAZO <=4 SENSITIVE Sensitive     * >=100,000 COLONIES/mL ESCHERICHIA COLI  Blood culture (routine x 2)     Status: None (Preliminary result)   Collection Time: 03/03/16 11:20 AM  Result Value Ref Range Status   Specimen Description BLOOD RIGHT ANTECUBITAL  Final   Special Requests BOTTLES DRAWN AEROBIC AND ANAEROBIC 5CC  Final   Culture NO GROWTH 3 DAYS  Final   Report Status PENDING  Incomplete  Blood culture (routine x 2)     Status: None (Preliminary result)   Collection Time: 03/03/16 11:26 AM  Result Value Ref Range Status   Specimen Description BLOOD LEFT FOREARM  Final   Special Requests BOTTLES DRAWN AEROBIC AND ANAEROBIC  5CC  Final   Culture NO GROWTH 3 DAYS  Final   Report Status PENDING  Incomplete    Radiology: No results found.  Medications:   . amitriptyline  75 mg Oral QHS  . ampicillin-sulbactam (UNASYN) IV  3 g Intravenous Q6H  . aspirin  162 mg Oral Daily  . collagenase   Topical  Daily  . docusate sodium  100 mg Oral BID  . dorzolamide-timolol  1 drop Both Eyes BID  . enoxaparin (LOVENOX) injection  40 mg Subcutaneous Q24H  . hydrocortisone  25 mg Rectal BID  . insulin aspart  0-15 Units Subcutaneous TID WC  . insulin aspart  0-5 Units Subcutaneous QHS  . irbesartan  150 mg Oral Daily  . multivitamin with minerals  1 tablet Oral Daily  . polyethylene glycol  17 g Oral Daily   Continuous Infusions:   Time spent: 25 minutes.   LOS: 3 days   Dave Mergen MD PhD  [facility] Pager [phone].   *Please refer to [email or web address], password [email or web address] to get updated schedule on who will round on this patient, as hospitalists switch teams weekly. If 7PM-7AM, please contact night-coverage at [email or web address], password [email or web address] for any overnight needs.  03/07/2016, 8:06 AM

## 2016-03-08 DIAGNOSIS — M5381 Other specified dorsopathies, occipito-atlanto-axial region: Secondary | ICD-10-CM | POA: Diagnosis not present

## 2016-03-08 DIAGNOSIS — L89109 Pressure ulcer of unspecified part of back, unspecified stage: Secondary | ICD-10-CM | POA: Diagnosis not present

## 2016-03-08 DIAGNOSIS — J189 Pneumonia, unspecified organism: Secondary | ICD-10-CM | POA: Diagnosis not present

## 2016-03-08 DIAGNOSIS — N390 Urinary tract infection, site not specified: Secondary | ICD-10-CM | POA: Diagnosis not present

## 2016-03-08 DIAGNOSIS — E119 Type 2 diabetes mellitus without complications: Secondary | ICD-10-CM | POA: Diagnosis not present

## 2016-03-08 DIAGNOSIS — C9310 Chronic myelomonocytic leukemia not having achieved remission: Secondary | ICD-10-CM | POA: Diagnosis not present

## 2016-03-08 DIAGNOSIS — M6281 Muscle weakness (generalized): Secondary | ICD-10-CM | POA: Diagnosis not present

## 2016-03-08 DIAGNOSIS — K5909 Other constipation: Secondary | ICD-10-CM | POA: Diagnosis not present

## 2016-03-08 DIAGNOSIS — R278 Other lack of coordination: Secondary | ICD-10-CM | POA: Diagnosis not present

## 2016-03-08 DIAGNOSIS — G4733 Obstructive sleep apnea (adult) (pediatric): Secondary | ICD-10-CM | POA: Diagnosis not present

## 2016-03-08 DIAGNOSIS — R2681 Unsteadiness on feet: Secondary | ICD-10-CM | POA: Diagnosis not present

## 2016-03-08 DIAGNOSIS — R279 Unspecified lack of coordination: Secondary | ICD-10-CM | POA: Diagnosis not present

## 2016-03-08 DIAGNOSIS — R0602 Shortness of breath: Secondary | ICD-10-CM | POA: Diagnosis not present

## 2016-03-08 DIAGNOSIS — J9601 Acute respiratory failure with hypoxia: Secondary | ICD-10-CM | POA: Diagnosis not present

## 2016-03-08 DIAGNOSIS — R41841 Cognitive communication deficit: Secondary | ICD-10-CM | POA: Diagnosis not present

## 2016-03-08 DIAGNOSIS — B9620 Unspecified Escherichia coli [E. coli] as the cause of diseases classified elsewhere: Secondary | ICD-10-CM | POA: Diagnosis not present

## 2016-03-08 LAB — BASIC METABOLIC PANEL
Anion gap: 6 (ref 5–15)
BUN: 7 mg/dL (ref 6–20)
CO2: 29 mmol/L (ref 22–32)
CREATININE: 0.63 mg/dL (ref 0.61–1.24)
Calcium: 8.5 mg/dL — ABNORMAL LOW (ref 8.9–10.3)
Chloride: 100 mmol/L — ABNORMAL LOW (ref 101–111)
GFR calc Af Amer: >60 mL/min (ref >60)
Glucose, Bld: 140 mg/dL — ABNORMAL HIGH (ref 65–99)
Potassium: 3.9 mmol/L (ref 3.5–5.1)
SODIUM: 135 mmol/L (ref 135–145)

## 2016-03-08 LAB — CULTURE, BLOOD (ROUTINE X 2)
Culture: NO GROWTH
Culture: NO GROWTH

## 2016-03-08 LAB — GLUCOSE, CAPILLARY
Glucose-Capillary: 119 mg/dL — ABNORMAL HIGH (ref 65–99)
Glucose-Capillary: 139 mg/dL — ABNORMAL HIGH (ref 65–99)

## 2016-03-08 LAB — MAGNESIUM: MAGNESIUM: 1.7 mg/dL (ref 1.7–2.4)

## 2016-03-08 MED ORDER — FUROSEMIDE 10 MG/ML IJ SOLN
40.0000 mg | Freq: Once | INTRAMUSCULAR | Status: AC
  Administered 2016-03-08: 40 mg via INTRAVENOUS
  Filled 2016-03-08: qty 4

## 2016-03-08 MED ORDER — HYDROCORTISONE ACETATE 25 MG RE SUPP: 25.0000 mg | Freq: Two times a day (BID) | RECTAL | 0 refills | Status: DC

## 2016-03-08 MED ORDER — FUROSEMIDE 40 MG PO TABS: 40.0000 mg | ORAL_TABLET | ORAL | 0 refills | Status: DC

## 2016-03-08 MED ORDER — POLYETHYLENE GLYCOL 3350 17 G PO PACK: 17.0000 g | PACK | Freq: Every day | ORAL | Status: DC

## 2016-03-08 MED ORDER — FLORANEX PO PACK: 1.0000 g | PACK | Freq: Three times a day (TID) | ORAL | 0 refills | Status: DC

## 2016-03-08 MED ORDER — PRAMOXINE HCL 1 % RE FOAM: Freq: Three times a day (TID) | RECTAL | 0 refills | Status: DC | PRN

## 2016-03-08 MED ORDER — AMOXICILLIN-POT CLAVULANATE 875-125 MG PO TABS: 1.0000 | ORAL_TABLET | Freq: Two times a day (BID) | ORAL | 0 refills | Status: DC

## 2016-03-08 MED ORDER — SENNOSIDES-DOCUSATE SODIUM 8.6-50 MG PO TABS: 2.0000 | ORAL_TABLET | Freq: Two times a day (BID) | ORAL | 0 refills | Status: DC

## 2016-03-08 NOTE — Progress Notes (Signed)
Patient will discharge to [facility] Anticipated discharge date: 7/31 Family notified: pt girlfriend, Chartered certified accountant by [employer]- scheduled at 3:30pm  CSW signing off.  Domenica Reamer, [place] Social Worker [phone]

## 2016-03-08 NOTE — Care Management Note (Signed)
Case Management Note  Patient Details  Name: Nathan Cunningham MRN: [id number] Date of Birth: 1925-03-22  Subjective/Objective:     Admitted with [facility]NA.               Action/Plan: Discharge to SNF today.  Expected Discharge Date:      03/08/2016            Expected Discharge Plan:  [place]  In-House Referral:  Clinical Social Work  Discharge planning Services  CM Consult      Status of Service:  Completed, signed off  If discussed at [employer] of Stay Meetings, dates discussed:    Additional Comments:  Sharin Mons, RN 03/08/2016, 11:13 AM
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
## 2016-03-08 NOTE — Progress Notes (Signed)
NURSING PROGRESS NOTE  Nathan Cunningham [id number] Discharge Data: 03/08/2016 4:52 PM Attending Provider: No att. providers found [id number] [name], MD     [facility] to be D/C'd Skilled nursing facility per MD order.  Discussed with the patient the After Visit Summary and all questions fully answered. All IV's discontinued with no bleeding noted. All belongings returned to patient for patient to take home.   Last Vital Signs:  Blood pressure (!) 100/41, pulse 84, temperature 99.5 F (37.5 C), temperature source Oral, resp. rate (!) 21, height 5\' 8"  (1.727 m), weight 93.1 kg (205 lb 4.8 oz), SpO2 91 %.  Discharge Medication List   Medication List    STOP taking these medications   docusate sodium 100 MG capsule Commonly known as:  COLACE   K-99 595 MG Caps Generic drug:  Potassium Gluconate     TAKE these medications   amitriptyline 75 MG tablet Commonly known as:  ELAVIL TAKE 1 TABLET BY MOUTH AT BEDTIME   amoxicillin-clavulanate 875-125 MG tablet Commonly known as:  AUGMENTIN Take 1 tablet by mouth 2 (two) times daily.   aspirin 325 MG tablet Take 162 mg by mouth daily.   cyanocobalamin 1000 MCG/ML injection Commonly known as:  (VITAMIN B-12) Inject 1028mcg every 60days IM (every other month)   dorzolamide-timolol 22.3-6.8 MG/ML ophthalmic solution Commonly known as:  COSOPT Place 1 drop into both eyes 2 (two) times daily.   EYE-VITES Tabs Take 1 tablet by mouth 2 (two) times daily.   fish oil-omega-3 fatty acids 1000 MG capsule Take 2 g by mouth 3 (three) times daily. Take six daily   furosemide 40 MG tablet Commonly known as:  LASIX Take 1 tablet (40 mg total) by mouth every Wednesday and Saturday. Start taking on:  03/10/2016   glipiZIDE 5 MG 24 hr tablet Commonly known as:  GLUCOTROL XL Take 1 tablet (5 mg total) by mouth daily.   hydrocortisone 25 MG suppository Commonly known as:  ANUSOL-HC Place 1 suppository (25 mg total) rectally 2 (two)  times daily.   lactobacillus Pack Take 1 packet (1 g total) by mouth 3 (three) times daily with meals.   MAGNEBIND 400 400-200-1 MG Tabs Generic drug:  Magnesium-Calcium-Folic Acid Take 1 tablet by mouth daily.   Melatonin 5 MG Caps Take 1 each by mouth at bedtime.   pramoxine 1 % foam Commonly known as:  PROCTOFOAM Place rectally 3 (three) times daily as needed for itching.   RA CHROMIUM PICOLINATE 400 MCG Tabs Generic drug:  Chromium Picolinate Take 1 tablet by mouth daily.   SANTYL ointment Generic drug:  collagenase Apply 1 application topically daily.   Saxagliptin-Metformin 12-998 MG Tb24 Commonly known as:  KOMBIGLYZE XR Take 1 each by mouth daily.   senna-docusate 8.6-50 MG tablet Commonly known as:  Senokot-S Take 2 tablets by mouth 2 (two) times daily.   sitaGLIPtin 100 MG tablet Commonly known as:  JANUVIA Take 1 tablet (100 mg total) by mouth daily.   tamsulosin 0.4 MG Caps capsule Commonly known as:  FLOMAX Take 0.4 mg by mouth daily.   telmisartan 80 MG tablet Commonly known as:  MICARDIS TAKE 1 TABLET DAILY   triamcinolone 0.1 % cream : eucerin Crea Apply 1 application topically 3 (three) times daily as needed.

## 2016-03-08 NOTE — Care Management Important Message (Signed)
Important Message  Patient Details  Name: Nathan Cunningham MRN: [id number] Date of Birth: [birth year]   Medicare Important Message Given:  Yes    Edoardo Laforte, [place] 03/08/2016, 3:35 PM

## 2016-03-08 NOTE — Discharge Summary (Signed)
Discharge Summary  Nathan Cunningham [id number] DOB: [birth year]  PCP: Arnette Norris, MD  Admit date: 03/03/2016 Discharge date: 03/08/2016  Time spent: >20mins  Recommendations for Outpatient Follow-up:  1. F/u with PMD at SNF for hospital discharge follow up, repeat cbc/bmp at follow up 2. pmd to repeat cxr to ensure resolution of pneumonia and exclude underlying maliganancy  Discharge Diagnoses:  Active Hospital Problems   Diagnosis Date Noted  . CAP (community acquired pneumonia) 03/03/2016  . Hemorrhoids 03/05/2016  . Decubitus ulcer of buttock, stage 2 03/03/2016  . Physical deconditioning 03/03/2016  . Anemia 03/03/2016  . Thrombocytopenia ([place]) 03/03/2016  . Acute respiratory distress (HCC) 03/03/2016  . UTI (lower urinary tract infection) 03/03/2016  . Frequent falls 01/01/2016  . 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 HTN (hypertension) 04/24/2007  . Diabetes mellitus with complication ([place]) [id number]  . DISORDER, CERVICAL [place] W/MYELOPATHY 03/23/2007  . Constipation 03/23/2007    Resolved Hospital Problems   Diagnosis Date Noted Date Resolved  No resolved problems to display.    Discharge Condition: stable  Diet recommendation: heart healthy/carb modified  CHL IP DIET RECOMMENDATION 03/04/2016  SLP Diet Recommendations Regular solids;Thin liquid  Liquid Administration via Cup;Straw  Medication Administration Whole meds with liquid  Compensations Slow rate;Small sips/bites;Multiple dry swallows after each bite/sip  Postural Changes Seated upright at 90 degrees     Filed Weights   03/05/16 0500 03/06/16 0617 03/08/16 0506  Weight: 92.7 kg (204 lb 4.8 oz) 91.8 kg (202 lb 6.1 oz) 93.1 kg (205 lb 4.8 oz)    History of present illness:  Chief Complaint: falls/generalized weakness  HPI: Nathan Cunningham is a delightful 82 y.o. male with medical history significant for diabetes, hypertension, macular degeneration, diverticulosis, HCC, chronic  constipation frequent falls presents to the emergency Department chief complaint of fall and generalized weakness. Initial evaluation concerning for community-acquired pneumonia.  Information is obtained from the patient. He reports a history of frequent falls and reports he fell yesterday evening. He uses a walker and reports his legs just "gave out". Last night he awakened during the night while going to the bathroom he states he fell again. This point he hit his head. He denies any loss of consciousness. He was too weak to get up on the floor all night. Associated symptoms include worsening chronic nonproductive cough and worsening weakness. He denies headache visual disturbances fever chills. He denies chest pain palpitations lower extremity edema. He has reported some chronic constipation and rectal pain. He did have a bowel movement yesterday. He notes some blood on the toilet paper. He denies dysuria hematuria frequency or urgency.    ED Course: Emergency department he is afebrile hemodynamically stable oxygen saturation level drops to 88% with exertion (repositioning in bed), nontoxic appearing. 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 Empiric Unasyn was started to cover aspiration pathogens. Blood cultures no growth so far, sputum not collected, . Continue  nebulizers as  needed.Evaluated by speech therapist, continue regular diet /thin liquid per recommendations. Strep pneumonia urinary antigen was negative. Legionella antigen negative. No fever, no leukocytosis or lactic acidosis. cxr with bilateral hypoinflation, trial of oral lasix twice a week prescribed as well as two more days of augmentin at discharge, pmd to repeat cxr in 4weeks . Wean oxygen   E. Coli UTI (lower urinary tract infection) ecoli pansensitive, patient did have lower abdominal initially on presentation, now the pain has resolved.  On empiric antibiotics for CAP.   Active Problems:    Diabetes mellitus with complication ([place]) On oral hypoglycemics at home. Currently being managed with moderate scale SSI. Hemoglobin A1c 6.2%. CBGs 120-148.    HTN (hypertension) Continue Avapro.    chronic Constipation  Given a milk and molasses enema with good results.  Continue bowel regimen with daily MiraLAX.    Hemorrhoids Start hydrocortisone suppositories and Proctofoam PRN.    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 Folate WNL. B-12 360. Findings consistent with anemia of chronic disease in the setting of a myelomonocytic leukemia.    Frequent falls/physical deconditioning and cervical disc disorder with myelopathy Consider tapering off Elavil. snf placement    Decubitus ulcer of buttock, stage 2 Continue wound care with Santyl.      Family Communication/Anticipated D/C date and plan/Code Status   DVT prophylaxis while in the hospital: Lovenox ordered. Code Status: Full Code.  Family Communication: girlfrend of 72yrs  at the bedside. Disposition Plan:  SNF on Monday    Discharge Exam: BP 128/73 (BP Location: Right Arm)   Pulse 84   Temp 99.2 F (37.3 C) (Oral)   Resp 16   Ht 5\' 8"  (1.727 m)   Wt 93.1 kg (205 lb 4.8 oz)   SpO2 94%   BMI 31.22 kg/m    General exam: Appears calm and  comfortable.  Respiratory system: Clear to auscultation, but diminished in the bases. Respiratory effort normal. Cardiovascular system: S1 & S2 heard, RRR. No JVD,  rubs, gallops or clicks. No murmurs. Gastrointestinal system: Abdomen is nondistended, soft and nontender. No organomegaly or masses felt. Normal bowel sounds heard. Central nervous system: Alert and oriented. No focal neurological deficits. Extremities: No clubbing,  or cyanosis. No edema. Skin: Sacral decubitus as described by WOC. Psychiatry: Judgement and insight appear normal. Mood & affect appropriate.    Discharge Instructions You were cared for by a hospitalist during your hospital stay. If you have any questions about your discharge medications or the care you received while you were in the hospital after you are discharged, you can call the unit and asked to speak with the hospitalist on call if the hospitalist that took care of you is not available. Once you are discharged, your primary care physician will handle any further medical issues. Please note that NO REFILLS for any discharge medications will be authorized once you are discharged, as it is imperative that you return to your primary care physician (or establish a relationship with a primary care physician if you do not have one) for your aftercare needs so that they can reassess your need for medications and monitor your lab values.  Discharge Instructions    Diet - low sodium heart healthy    Complete by:  As directed   Carb modified   Increase activity slowly    Complete by:  As directed       Medication List  STOP taking these medications   docusate sodium 100 MG capsule Commonly known as:  COLACE   K-99 595 MG Caps Generic drug:  Potassium Gluconate     TAKE these medications   amitriptyline 75 MG tablet Commonly known as:  ELAVIL TAKE 1 TABLET BY MOUTH AT BEDTIME   amoxicillin-clavulanate 875-125 MG tablet Commonly known as:  AUGMENTIN Take  1 tablet by mouth 2 (two) times daily.   aspirin 325 MG tablet Take 162 mg by mouth daily.   cyanocobalamin 1000 MCG/ML injection Commonly known as:  (VITAMIN B-12) Inject 1063mcg every 60days IM (every other month)   dorzolamide-timolol 22.3-6.8 MG/ML ophthalmic solution Commonly known as:  COSOPT Place 1 drop into both eyes 2 (two) times daily.   EYE-VITES Tabs Take 1 tablet by mouth 2 (two) times daily.   fish oil-omega-3 fatty acids 1000 MG capsule Take 2 g by mouth 3 (three) times daily. Take six daily   furosemide 40 MG tablet Commonly known as:  LASIX Take 1 tablet (40 mg total) by mouth every Wednesday and Saturday. Start taking on:  03/10/2016   glipiZIDE 5 MG 24 hr tablet Commonly known as:  GLUCOTROL XL Take 1 tablet (5 mg total) by mouth daily.   hydrocortisone 25 MG suppository Commonly known as:  ANUSOL-HC Place 1 suppository (25 mg total) rectally 2 (two) times daily.   lactobacillus Pack Take 1 packet (1 g total) by mouth 3 (three) times daily with meals.   MAGNEBIND 400 400-200-1 MG Tabs Generic drug:  Magnesium-Calcium-Folic Acid Take 1 tablet by mouth daily.   Melatonin 5 MG Caps Take 1 each by mouth at bedtime.   pramoxine 1 % foam Commonly known as:  PROCTOFOAM Place rectally 3 (three) times daily as needed for itching.   RA CHROMIUM PICOLINATE 400 MCG Tabs Generic drug:  Chromium Picolinate Take 1 tablet by mouth daily.   SANTYL ointment Generic drug:  collagenase Apply 1 application topically daily.   Saxagliptin-Metformin 12-998 MG Tb24 Commonly known as:  KOMBIGLYZE XR Take 1 each by mouth daily.   senna-docusate 8.6-50 MG tablet Commonly known as:  Senokot-S Take 2 tablets by mouth 2 (two) times daily.   sitaGLIPtin 100 MG tablet Commonly known as:  JANUVIA Take 1 tablet (100 mg total) by mouth daily.   tamsulosin 0.4 MG Caps capsule Commonly known as:  FLOMAX Take 0.4 mg by mouth daily.   telmisartan 80 MG tablet Commonly  known as:  MICARDIS TAKE 1 TABLET DAILY   triamcinolone 0.1 % cream : eucerin Crea Apply 1 application topically 3 (three) times daily as needed.      No Known Allergies Follow-up Information    Arnette Norris, MD Follow up in 1 month(s).   Specialty:  Family Medicine Why:  hospital discharge follow up, repeat cbc/bmp at follow up pmd to schedule Followup PA and lateral chest X-ray in 3-4 weeks following trial of antibiotic therapy to ensure resolution of pneumonia and exclude underlying malignancy. Contact information: [place] [address] [phone]            The results of significant diagnostics from this hospitalization (including imaging, microbiology, ancillary and laboratory) are listed below for reference.    Significant Diagnostic Studies: Dg Chest 2 View  Result Date: 03/03/2016 CLINICAL DATA:  Weakness, balance disturbance, fall yesterday; history of diabetes, hypertension atrial flutter EXAM: CHEST  2 VIEW COMPARISON:  PA and lateral chest x-ray dated September 17, 2015 FINDINGS: The lungs are mildly  hypoinflated. There is dense infiltrate at the right lung base posteriorly. There are small bilateral pleural effusions. The heart is normal in size. The pulmonary vascularity is not engorged. The bony thorax exhibits no acute abnormality. IMPRESSION: Bilateral hypo inflation. Infiltrate in the right lower lobe worrisome for pneumonia. Small bilateral pleural effusions. Mild cardiomegaly without significant pulmonary vascular congestion. Followup PA and lateral chest X-ray is recommended in 3-4 weeks following trial of antibiotic therapy to ensure resolution and exclude underlying malignancy. Electronically Signed   By: David  Martinique M.D.   On: 03/03/2016 09:59  Dg Forearm Left  Result Date: 03/03/2016 CLINICAL DATA:  Pain following fall EXAM: LEFT FOREARM - 2 VIEW COMPARISON:  None. FINDINGS: Frontal and lateral views were obtained. There is no fracture or  dislocation. There is degenerative change in the left elbow and wrist regions. There is a minus ulnar variant. There is a spur arising from the olecranon process of the proximal ulna. There is a smaller coracoid process proximal ulnar spur. IMPRESSION: Areas of osteoarthritic change. Minus ulnar variance. No acute fracture or dislocation. Electronically Signed   By: Lowella Grip III M.D.   On: 03/03/2016 09:59  Ct Head Wo Contrast  Result Date: 03/03/2016 CLINICAL DATA:  Forehead injury after fall today. No loss of consciousness. EXAM: CT HEAD WITHOUT CONTRAST CT CERVICAL SPINE WITHOUT CONTRAST TECHNIQUE: Multidetector CT imaging of the head and cervical spine was performed following the standard protocol without intravenous contrast. Multiplanar CT image reconstructions of the cervical spine were also generated. COMPARISON:  CT scan of head of Dec 22, 2015. FINDINGS: CT HEAD FINDINGS There is again noted complete opacification of the right frontal, ethmoid and maxillary sinuses consistent with chronic sinusitis. Bony calvarium is unremarkable. Mild diffuse cortical atrophy is noted. Mild chronic ischemic white matter disease is noted. No mass effect or midline shift is noted. Ventricular size is within normal limits. There is no evidence of mass lesion, hemorrhage or acute infarction. CT CERVICAL SPINE FINDINGS No definite fracture or spondylolisthesis is noted. Patient appears to be status post laminectomy of C3, C4, C5, C6 and C7. Extensive degenerative changes seen involving posterior facet joints at these levels. Mild degenerative disc disease is noted at C6-7 and C7-T1. Anterior osteophyte formation is noted at multiple levels. Visualized lung apices are unremarkable. IMPRESSION: Stable findings consistent with chronic sinusitis of the right frontal, ethmoid and maxillary sinuses. Mild diffuse cortical atrophy. Mild chronic ischemic white matter disease. No acute intracranial abnormality seen.  Extensive postsurgical and degenerative changes in the cervical spine as described above. No acute abnormality seen in the cervical spine. Electronically Signed   By: Marijo Conception, M.D.   On: 03/03/2016 10:14  Ct Cervical Spine Wo Contrast  Result Date: 03/03/2016 CLINICAL DATA:  Forehead injury after fall today. No loss of consciousness. EXAM: CT HEAD WITHOUT CONTRAST CT CERVICAL SPINE WITHOUT CONTRAST TECHNIQUE: Multidetector CT imaging of the head and cervical spine was performed following the standard protocol without intravenous contrast. Multiplanar CT image reconstructions of the cervical spine were also generated. COMPARISON:  CT scan of head of Dec 22, 2015. FINDINGS: CT HEAD FINDINGS There is again noted complete opacification of the right frontal, ethmoid and maxillary sinuses consistent with chronic sinusitis. Bony calvarium is unremarkable. Mild diffuse cortical atrophy is noted. Mild chronic ischemic white matter disease is noted. No mass effect or midline shift is noted. Ventricular size is within normal limits. There is no evidence of mass lesion, hemorrhage or acute infarction. CT  CERVICAL SPINE FINDINGS No definite fracture or spondylolisthesis is noted. Patient appears to be status post laminectomy of C3, C4, C5, C6 and C7. Extensive degenerative changes seen involving posterior facet joints at these levels. Mild degenerative disc disease is noted at C6-7 and C7-T1. Anterior osteophyte formation is noted at multiple levels. Visualized lung apices are unremarkable. IMPRESSION: Stable findings consistent with chronic sinusitis of the right frontal, ethmoid and maxillary sinuses. Mild diffuse cortical atrophy. Mild chronic ischemic white matter disease. No acute intracranial abnormality seen. Extensive postsurgical and degenerative changes in the cervical spine as described above. No acute abnormality seen in the cervical spine. Electronically Signed   By: Marijo Conception, M.D.   On:  03/03/2016 10:14  Dg Swallowing Func-speech Pathology  Result Date: 03/04/2016 Objective Swallowing Evaluation: Type of Study: MBS-Modified Barium Swallow Study Patient Details Name: KASEN RAMMER MRN: [id number] Date of Birth: May 12, 1925 Today's Date: 03/04/2016 Time: SLP Start Time (ACUTE ONLY): 1340-SLP Stop Time (ACUTE ONLY): 1400 SLP Time Calculation (min) (ACUTE ONLY): 20 min Past Medical History: Past Medical History: Diagnosis Date . Adenomatous polyp of colon 03/2003 . Anemia  . Constipation  . Diverticulosis  . Dizziness  . ETOH abuse   binge drinking . Hemorrhoids  . Hyperlipidemia  . Hypertension  . Macular degeneration   Right eye . OSA on CPAP  . Type II diabetes mellitus ([place])  Past Surgical History: Past Surgical History: Procedure Laterality Date . ANTERIOR CERVICAL DECOMP/DISCECTOMY FUSION   . APPENDECTOMY  1935 . BACK SURGERY   . CATARACT EXTRACTION Left  . INGUINAL HERNIA REPAIR  1935 . TONSILLECTOMY   HPI: Pt is an 82 y.o. male with PMH of diabetes, hypertension, macular degeneration, diverticulosis, HCC,chronic constipation frequent falls presented to the emergency Department  7/26 with chief complaint of fall and generalized weakness. Initial evaluation concerning for community-acquired pneumonia. CXR showed bilateral hypoinflation, infiltrate in RLL worrisome for PNA, small bilateral pleural effusions, possible aspiration. Pt admitted to some throat clearing while eating and drinking. Head CT showed pt appears to be status post laminectomy of C3, C4, C5, C6 and C7. Extensive degenerative changes seen involving posterior facet joints at these levels. Mild degenerative disc disease is noted at C6-7 and C7-T1. Anterior osteophyte formation is noted at multiple levels.Bedside swallow eval ordered to further assess swallowing/ risk for aspiration. No Data Recorded Assessment / Plan / Recommendation CHL IP CLINICAL IMPRESSIONS 03/04/2016 Therapy Diagnosis Mild oral phase dysphagia Clinical  Impression Pt with a very mild oral dysphagia with piecemeal swallowing and inconsistent premature spill of liquids to the level of the vallecula along with minimal oral residuals which pool into the vallecula post-swallow. Pt did not consistently sense liquid residuals but cleared when cued to swallow again. Pharyngeal swallow timely. Aspiration risk appears mild at this time. Recommend that pt continue regular diet consistency, thin liquids, meds whole with liquid, swallow 2 times with liquids, intermittent supervision to ensure pt taking small bites/ sips and swallowing 2 times. Reviewed results/ compensatory strategies with pt who is in agreement with recommendations. Will f/u x1 to ensure diet tolerance and review compensatory strategies.  Impact on safety and function Mild aspiration risk   CHL IP TREATMENT RECOMMENDATION 03/04/2016 Treatment Recommendations Therapy as outlined in treatment plan below   No flowsheet data found. CHL IP DIET RECOMMENDATION 03/04/2016 SLP Diet Recommendations Regular solids;Thin liquid Liquid Administration via Cup;Straw Medication Administration Whole meds with liquid Compensations Slow rate;Small sips/bites;Multiple dry swallows after each bite/sip Postural Changes Seated upright at  90 degrees   CHL IP OTHER RECOMMENDATIONS 03/04/2016 Recommended Consults -- Oral Care Recommendations Oral care BID Other Recommendations --   CHL IP FOLLOW UP RECOMMENDATIONS 03/04/2016 Follow up Recommendations None   CHL IP FREQUENCY AND DURATION 03/04/2016 Speech Therapy Frequency (ACUTE ONLY) min 1 x/week Treatment Duration 1 week      CHL IP ORAL PHASE 03/04/2016 Oral Phase Impaired Oral - Pudding Teaspoon -- Oral - Pudding Cup -- Oral - Honey Teaspoon -- Oral - Honey Cup -- Oral - Nectar Teaspoon -- Oral - Nectar Cup -- Oral - Nectar Straw -- Oral - Thin Teaspoon -- Oral - Thin Cup Piecemeal swallowing;Premature spillage Oral - Thin Straw Premature spillage;Piecemeal swallowing Oral - Puree WFL  Oral - Mech Soft -- Oral - Regular WFL Oral - Multi-Consistency -- Oral - Pill WFL Oral Phase - Comment --  CHL IP PHARYNGEAL PHASE 03/04/2016 Pharyngeal Phase Impaired Pharyngeal- Pudding Teaspoon -- Pharyngeal -- Pharyngeal- Pudding Cup -- Pharyngeal -- Pharyngeal- Honey Teaspoon -- Pharyngeal -- Pharyngeal- Honey Cup -- Pharyngeal -- Pharyngeal- Nectar Teaspoon -- Pharyngeal -- Pharyngeal- Nectar Cup -- Pharyngeal -- Pharyngeal- Nectar Straw -- Pharyngeal -- Pharyngeal- Thin Teaspoon -- Pharyngeal -- Pharyngeal- Thin Cup WFL Pharyngeal -- Pharyngeal- Thin Straw Delayed swallow initiation-vallecula;Pharyngeal residue - valleculae Pharyngeal -- Pharyngeal- Puree WFL Pharyngeal -- Pharyngeal- Mechanical Soft -- Pharyngeal -- Pharyngeal- Regular WFL Pharyngeal -- Pharyngeal- Multi-consistency -- Pharyngeal -- Pharyngeal- Pill WFL Pharyngeal -- Pharyngeal Comment --  CHL IP CERVICAL ESOPHAGEAL PHASE 03/04/2016 Cervical Esophageal Phase WFL Pudding Teaspoon -- Pudding Cup -- Honey Teaspoon -- Honey Cup -- Nectar Teaspoon -- Nectar Cup -- Nectar Straw -- Thin Teaspoon -- Thin Cup -- Thin Straw -- Puree -- Mechanical Soft -- Regular -- Multi-consistency -- Pill -- Cervical Esophageal Comment -- CHL IP GO 03/04/2016 Functional Assessment Tool Used clinical judgment Functional Limitations Swallowing Swallow Current Status [id number]) CI Swallow Goal Status [id number]) CI Swallow Discharge Status [id number]) (None) Motor Speech Current Status [id number]) (None) Motor Speech Goal Status [id number]) (None) Motor Speech Goal Status [id number]) (None) Spoken Language Comprehension Current Status [id number]) (None) Spoken Language Comprehension Goal Status [id number]) (None) Spoken Language Comprehension Discharge Status [id number]) (None) Spoken Language Expression Current Status [id number]) (None) Spoken Language Expression Goal Status [id number]) (None) Spoken Language Expression Discharge Status [phone]) (None) Attention Current Status [id number]) (None) Attention Goal Status  [id number]) (None) Attention Discharge Status [id number]) (None) Memory Current Status AE:130515) (None) Memory Goal Status GI:463060) (None) Memory Discharge Status [id number]) (None) Voice Current Status [id number]) (None) Voice Goal Status [id number]) (None) Voice Discharge Status [id number]) (None) Other Speech-Language Pathology Functional Limitation [phone]) (None) Other Speech-Language Pathology Functional Limitation Goal Status [id number]) (None) Other Speech-Language Pathology Functional Limitation Discharge Status [phone]) (None) Kern Reap, MA, CCC-SLP 03/04/2016, 2:26 PM [id number]              Microbiology: Recent Results (from the past 240 hour(s))  Urine culture     Status: Abnormal   Collection Time: 03/03/16 10:52 AM  Result Value Ref Range Status   Specimen Description URINE, RANDOM  Final   Special Requests NONE  Final   Culture >=100,000 COLONIES/mL ESCHERICHIA COLI (A)  Final   Report Status 03/05/2016 FINAL  Final   Organism ID, Bacteria ESCHERICHIA COLI (A)  Final      Susceptibility   Escherichia coli - MIC*    AMPICILLIN <=2 SENSITIVE Sensitive     CEFAZOLIN <=4 SENSITIVE Sensitive     CEFTRIAXONE <=1 SENSITIVE Sensitive     CIPROFLOXACIN <=  0.25 SENSITIVE Sensitive     GENTAMICIN <=1 SENSITIVE Sensitive     IMIPENEM <=0.25 SENSITIVE Sensitive     NITROFURANTOIN <=16 SENSITIVE Sensitive     TRIMETH/SULFA <=20 SENSITIVE Sensitive     AMPICILLIN/SULBACTAM <=2 SENSITIVE Sensitive     PIP/TAZO <=4 SENSITIVE Sensitive     * >=100,000 COLONIES/mL ESCHERICHIA COLI  Blood culture (routine x 2)     Status: None (Preliminary result)   Collection Time: 03/03/16 11:20 AM  Result Value Ref Range Status   Specimen Description BLOOD RIGHT ANTECUBITAL  Final   Special Requests BOTTLES DRAWN AEROBIC AND ANAEROBIC 5CC  Final   Culture NO GROWTH 4 DAYS  Final   Report Status PENDING  Incomplete  Blood culture (routine x 2)     Status: None (Preliminary result)   Collection Time: 03/03/16 11:26 AM  Result Value  Ref Range Status   Specimen Description BLOOD LEFT FOREARM  Final   Special Requests BOTTLES DRAWN AEROBIC AND ANAEROBIC  5CC  Final   Culture NO GROWTH 4 DAYS  Final   Report Status PENDING  Incomplete     Labs: Basic Metabolic Panel:  Recent Labs Lab 03/03/16 0909 03/04/16 0523 03/07/16 0614 03/08/16 0606  NA 134* 136 136 135  K 4.5 4.3 3.8 3.9  CL 100* 102 101 100*  CO2 29 30 30 29   GLUCOSE 148* 143* 136* 140*  BUN 16 8 <5* 7  CREATININE 0.69 0.66 0.63 0.63  CALCIUM 8.8* 8.4* 8.6* 8.5*  MG  --   --  1.8 1.7   Liver Function Tests:  Recent Labs Lab 03/03/16 0909  AST 12*  ALT 13*  ALKPHOS 51  BILITOT 0.6  PROT 6.5  ALBUMIN 2.8*   No results for input(s): LIPASE, AMYLASE in the last 168 hours. No results for input(s): AMMONIA in the last 168 hours. CBC:  Recent Labs Lab 03/03/16 0909 03/04/16 0523 03/07/16 0614  WBC 8.2 7.2 7.1  NEUTROABS 3.8  --   --   HGB 10.9* 10.6* 11.5*  HCT 33.4* 33.3* 36.8*  MCV 93.6 93.5 95.6  PLT 117* 118* 142*   Cardiac Enzymes:  Recent Labs Lab 03/03/16 0909  CKTOTAL 81   BNP: BNP (last 3 results)  Recent Labs  03/03/16 1120  BNP 34.5    ProBNP (last 3 results) No results for input(s): PROBNP in the last 8760 hours.  CBG:  Recent Labs Lab 03/07/16 1216 03/07/16 1645 03/07/16 2157 03/08/16 0757 03/08/16 1156  GLUCAP 144* 119* 133* 119* 139*       Signed:  Shikita Vaillancourt MD, PhD  [facility] 03/08/2016, 1:01 PM

## 2016-03-08 NOTE — NC FL2 (Signed)
[place] LEVEL OF CARE SCREENING TOOL     IDENTIFICATION  Patient Name: Nathan Cunningham Birthdate: 1924-10-22 Sex: male Admission Date (Current Location): 03/03/2016  [facility] and [place] Number:  Engineering geologist and Address:  [facility], [place] [address], [place], [place] [address]      Provider Number: [id number]  Attending Physician Name and Address:  Florencia Reasons, MD  Relative Name and Phone Number:       Current Level of Care: Hospital Recommended Level of Care: [place] Prior Approval Number:    Date Approved/Denied:   PASRR Number: [id number]  Discharge Plan: SNF    Current Diagnoses: Patient Active Problem List   Diagnosis Date Noted  . Hemorrhoids 03/05/2016  . CAP (community acquired pneumonia) 03/03/2016  . Respiratory distress 03/03/2016  . Aspiration pneumonia ([place]) 03/03/2016  . Decubitus ulcer of buttock, stage 2 03/03/2016  . Physical deconditioning 03/03/2016  . Anemia 03/03/2016  . Thrombocytopenia ([place]) 03/03/2016  . Acute respiratory distress (HCC) 03/03/2016  . UTI (lower urinary tract infection) 03/03/2016  . Sacral decubitus ulcer 02/03/2016  . Decubitus ulcer of sacral region, stage 1 02/03/2016  . Fall 01/01/2016  . Dizziness and giddiness 01/01/2016  . Frequent falls 01/01/2016  . 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 Abnormal CBC 10/21/2014  . Encounter for completion of form with patient 10/21/2014  . Chronic fatigue and malaise 07/22/2014  . Inguinal pain 03/19/2014  . Nocturia 03/19/2014  . 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 Thoracic degenerative disc disease 09/28/2010  . ATRIAL FLUTTER 02/27/2010  . APHTHOUS ULCERS 01/20/2010  . RISK OF FALLING 10/21/2009  . 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 PITYRIASIS [place] 01/21/2009  . WEIGHT GAIN 04/01/2008  . EDEMA 10/02/2007  . URINARY FREQUENCY, CHRONIC 10/02/2007  . GLOSSITIS 08/31/2007  . BEN LOC  HYPERPLASIA PROS W/O UR OBST & OTH LUTS 08/31/2007  . HTN (hypertension) 04/24/2007  . Diabetes mellitus with complication ([place]) [id number]  . B12 deficiency 03/23/2007  . Constipation 03/23/2007  . DISORDER, CERVICAL Leigh W/MYELOPATHY 03/23/2007  . STENOSIS, LUMBAR SPINE 03/23/2007  . Essential hypertension 02/01/2007    Orientation RESPIRATION BLADDER Height & Weight     Self, Time, Situation, Place  Other (Comment) (home CPAP at night) Continent Weight: 93.1 kg (205 lb 4.8 oz) Height:  5\' 8"  (172.7 cm)  BEHAVIORAL SYMPTOMS/MOOD NEUROLOGICAL BOWEL NUTRITION STATUS      Continent Diet (carb modified)  AMBULATORY STATUS COMMUNICATION OF NEEDS Skin   Limited Assist Verbally PU Stage and Appropriate Care   PU Stage 2 Dressing:  (2 on coccyx)                   Personal Care Assistance Level of Assistance  Bathing, Dressing Bathing Assistance: Limited assistance   Dressing Assistance: Limited assistance     Functional Limitations Info             SPECIAL CARE FACTORS FREQUENCY  PT (By licensed PT), OT (By licensed OT)     PT Frequency: 5/wk OT Frequency: 5/wk            Contractures      Additional Factors Info  Code Status, Allergies, Insulin Sliding Scale Code Status Info: FULL Allergies Info: NKA   Insulin Sliding Scale Info: 4/day       Current Medications (03/08/2016):  This is the current hospital active medication list Current Facility-Administered Medications  Medication Dose Route Frequency Provider Last Rate  Last Dose  . acetaminophen (TYLENOL) tablet 650 mg  650 mg Oral Q6H PRN Radene Gunning, NP       Or  . acetaminophen (TYLENOL) suppository 650 mg  650 mg Rectal Q6H PRN Radene Gunning, NP      . albuterol (PROVENTIL) (2.5 MG/3ML) 0.083% nebulizer solution 2.5 mg  2.5 mg Nebulization Q6H PRN Radene Gunning, NP      . amitriptyline (ELAVIL) tablet 75 mg  75 mg Oral QHS Radene Gunning, NP   75 mg at 03/07/16 2149  . Ampicillin-Sulbactam (UNASYN)  3 g in sodium chloride 0.9 % 100 mL IVPB  3 g Intravenous Q6H Rachel L Rumbarger, RPH 100 mL/hr at 03/08/16 0604 3 g at 03/08/16 0604  . aspirin tablet 162 mg  162 mg Oral Daily Lezlie Octave Black, NP   162 mg at 03/07/16 0900  . collagenase (SANTYL) ointment   Topical Daily Waldemar Dickens, MD      . docusate sodium (COLACE) capsule 100 mg  100 mg Oral BID Lezlie Octave Black, NP   100 mg at 03/07/16 0900  . dorzolamide-timolol (COSOPT) 22.3-6.8 MG/ML ophthalmic solution 1 drop  1 drop Both Eyes BID Radene Gunning, NP   1 drop at 03/07/16 2149  . enoxaparin (LOVENOX) injection 40 mg  40 mg Subcutaneous Q24H Lezlie Octave Black, NP   40 mg at 03/07/16 1846  . hydrocortisone (ANUSOL-HC) suppository 25 mg  25 mg Rectal BID Venetia Maxon Rama, MD   25 mg at 03/07/16 2150  . insulin aspart (novoLOG) injection 0-15 Units  0-15 Units Subcutaneous TID WC Radene Gunning, NP   Stopped at 03/07/16 1700  . insulin aspart (novoLOG) injection 0-5 Units  0-5 Units Subcutaneous QHS Lezlie Octave Black, NP      . irbesartan (AVAPRO) tablet 150 mg  150 mg Oral Daily Lezlie Octave Black, NP   150 mg at 03/07/16 0900  . multivitamin with minerals tablet 1 tablet  1 tablet Oral Daily Florencia Reasons, MD   1 tablet at 03/07/16 0900  . ondansetron (ZOFRAN) tablet 4 mg  4 mg Oral Q6H PRN Radene Gunning, NP       Or  . ondansetron [facility]) injection 4 mg  4 mg Intravenous Q6H PRN Radene Gunning, NP      . oxyCODONE-acetaminophen (PERCOCET/ROXICET) 5-325 MG per tablet 1-2 tablet  1-2 tablet Oral Q6H PRN Radene Gunning, NP   2 tablet at 03/03/16 1738  . polyethylene glycol (MIRALAX / GLYCOLAX) packet 17 g  17 g Oral Daily Venetia Maxon Rama, MD   17 g at 03/07/16 0900  . pramoxine (PROCTOFOAM) 1 % foam   Rectal TID PRN Venetia Maxon Rama, MD      . sodium chloride (OCEAN) 0.65 % nasal spray 1 spray  1 spray Each Nare PRN Florencia Reasons, MD   1 spray at 03/05/16 0359     Discharge Medications: Please see discharge summary for a list of discharge medications.  Relevant  Imaging Results:  Relevant Lab Results:   Additional Information SS#: [id number]  Cranford Mon, [place]

## 2016-03-08 NOTE — Progress Notes (Signed)
Report called and given to [facility] at [facility]

## 2016-03-08 NOTE — Clinical Social Work Placement (Signed)
   CLINICAL SOCIAL WORK PLACEMENT  NOTE  Date:  03/08/2016  Patient Details  Name: Nathan Cunningham MRN: [id number] Date of Birth: [birth year]  Clinical Social Work is seeking post-discharge placement for this patient at the [place] level of care (*CSW will initial, date and re-position this form in  chart as items are completed):  Yes   Patient/family provided with [place] Work Department's list of facilities offering this level of care within the geographic area requested by the patient (or if unable, by the patient's family).  Yes   Patient/family informed of their freedom to choose among providers that offer the needed level of care, that participate in Medicare, Medicaid or managed care program needed by the patient, have an available bed and are willing to accept the patient.  Yes   Patient/family informed of [facility]'s ownership interest in [facility] and [facility], as well as of the fact that they are under no obligation to receive care at these facilities.  PASRR submitted to EDS on       PASRR number received on       Existing PASRR number confirmed on 03/08/16     FL2 transmitted to all facilities in geographic area requested by pt/family on 03/08/16     FL2 transmitted to all facilities within larger geographic area on       Patient informed that his/her managed care company has contracts with or will negotiate with certain facilities, including the following:        Yes   Patient/family informed of bed offers received.  Patient chooses bed at [facility]     Physician recommends and patient chooses bed at      Patient to be transferred to [facility] on 03/08/16.  Patient to be transferred to facility by ptar     Patient family notified on 03/08/16 of transfer.  Name of family member notified:  Sarah     PHYSICIAN       Additional Comment:     _______________________________________________ Cranford Mon, LCSW 03/08/2016, 1:27 PM

## 2016-03-08 NOTE — Progress Notes (Signed)
Physical Therapy Treatment Patient Details Name: Nathan Cunningham MRN: [id number] DOB: September 26, 1924 Today's Date: 03/08/2016    History of Present Illness Patient is an 82 yo male admitted 03/03/16 with weakness, falls.  Patient with CAP with hypoxic resp failure.   PMH:  DM, HTN, macular degeneration, frequent falls    PT Comments    Patient is progressing toward mobility goals and demo'd increased activity tolerance this session. Pt continues to need min/mod A for OOB mobility. Current plan remains appropriate.   Follow Up Recommendations  SNF;Supervision/Assistance - 24 hour     Equipment Recommendations  None recommended by PT    Recommendations for Other Services       Precautions / Restrictions Precautions Precautions: Fall Precaution Comments: Has buttocks wound Restrictions Weight Bearing Restrictions: No    Mobility  Bed Mobility Overal bed mobility: Needs Assistance Bed Mobility: Supine to Sit     Supine to sit: HOB elevated;Mod assist     General bed mobility comments: cues for sequencing and technique with use of bed rail; assist to roll, bring bilat LE to EOB, elevate trunk into sitting, and scoot hips to EOB; min A for sitting balance initially and then min guard  Transfers Overall transfer level: Needs assistance Equipment used: Rolling walker (2 wheeled) Transfers: Sit to/from Stand Sit to Stand: Min assist;+2 safety/equipment;From elevated surface         General transfer comment: cues for hand placement and technique; assist to power up into standing   Ambulation/Gait Ambulation/Gait assistance: Mod assist;+2 safety/equipment Ambulation Distance (Feet): 50 Feet Assistive device: Rolling walker (2 wheeled) Gait Pattern/deviations: Step-through pattern;Decreased stride length;Trunk flexed;Narrow base of support     General Gait Details: pt maintained bilat knee and trunk flexion; cues for engaging bilat quads during stance phase with pt able  to improve for very short distance and became more flexed when fatigued; cues for posture and proximity of RW   Stairs            Wheelchair Mobility    Modified Rankin (Stroke Patients Only)       Balance     Sitting balance-Leahy Scale: Fair       Standing balance-Leahy Scale: Poor                      Cognition Arousal/Alertness: Awake/alert Behavior During Therapy: WFL for tasks assessed/performed;Flat affect Overall Cognitive Status: Within Functional Limits for tasks assessed                      Exercises      General Comments General comments (skin integrity, edema, etc.): SpO2 84% on RA post ambulation and pt educated on pursed lip breathing technique; 1L O2 donned with SpO2 increased to 91%       Pertinent Vitals/Pain Pain Assessment: No/denies pain    Home Living                      Prior Function            PT Goals (current goals can now be found in the care plan section) Acute Rehab PT Goals Patient Stated Goal: To get stronger PT Goal Formulation: With patient Time For Goal Achievement: 03/20/16 Potential to Achieve Goals: Good Progress towards PT goals: Progressing toward goals    Frequency  Min 3X/week    PT Plan Current plan remains appropriate    Co-evaluation  End of Session Equipment Utilized During Treatment: Gait belt;Oxygen Activity Tolerance: Patient limited by fatigue Patient left: with call bell/phone within reach;in chair;with chair alarm set     Time: [id number] PT Time Calculation (min) (ACUTE ONLY): 24 min  Charges:  $Gait Training: 8-22 mins $Therapeutic Activity: 8-22 mins                    G Codes:      [place] April, PTA Pager: [phone]   03/08/2016, 10:55 AM

## 2016-03-14 DIAGNOSIS — C9310 Chronic myelomonocytic leukemia not having achieved remission: Secondary | ICD-10-CM | POA: Diagnosis not present

## 2016-03-14 DIAGNOSIS — K5909 Other constipation: Secondary | ICD-10-CM | POA: Diagnosis not present

## 2016-03-14 DIAGNOSIS — E119 Type 2 diabetes mellitus without complications: Secondary | ICD-10-CM | POA: Diagnosis not present

## 2016-03-14 DIAGNOSIS — M5381 Other specified dorsopathies, occipito-atlanto-axial region: Secondary | ICD-10-CM | POA: Diagnosis not present

## 2016-03-14 DIAGNOSIS — G4733 Obstructive sleep apnea (adult) (pediatric): Secondary | ICD-10-CM | POA: Diagnosis not present

## 2016-03-14 DIAGNOSIS — J189 Pneumonia, unspecified organism: Secondary | ICD-10-CM | POA: Diagnosis not present

## 2016-03-14 DIAGNOSIS — L89109 Pressure ulcer of unspecified part of back, unspecified stage: Secondary | ICD-10-CM | POA: Diagnosis not present

## 2016-03-14 DIAGNOSIS — N390 Urinary tract infection, site not specified: Secondary | ICD-10-CM | POA: Diagnosis not present

## 2016-03-26 ENCOUNTER — Telehealth: Payer: Self-pay

## 2016-03-26 NOTE — Telephone Encounter (Signed)
I returned call to donna and gave verbal order.

## 2016-03-26 NOTE — Telephone Encounter (Signed)
Butch Penny from home health requesting verbal orders for PT/OT. Call back # [phone].

## 2016-03-26 NOTE — Telephone Encounter (Signed)
Ok to give verbal orders as requested. 

## 2016-03-28 DIAGNOSIS — R269 Unspecified abnormalities of gait and mobility: Secondary | ICD-10-CM | POA: Diagnosis not present

## 2016-03-28 DIAGNOSIS — L89323 Pressure ulcer of left buttock, stage 3: Secondary | ICD-10-CM | POA: Diagnosis not present

## 2016-03-28 DIAGNOSIS — R296 Repeated falls: Secondary | ICD-10-CM | POA: Diagnosis not present

## 2016-03-28 DIAGNOSIS — T24012D Burn of unspecified degree of left thigh, subsequent encounter: Secondary | ICD-10-CM | POA: Diagnosis not present

## 2016-03-31 ENCOUNTER — Other Ambulatory Visit: Payer: Self-pay | Admitting: *Deleted

## 2016-03-31 MED ORDER — SAXAGLIPTIN-METFORMIN ER 5-1000 MG PO TB24: 1.0000 | ORAL_TABLET | Freq: Every day | ORAL | 1 refills | Status: DC

## 2016-04-06 ENCOUNTER — Ambulatory Visit (INDEPENDENT_AMBULATORY_CARE_PROVIDER_SITE_OTHER): Payer: Medicare Other | Admitting: Family Medicine

## 2016-04-06 ENCOUNTER — Ambulatory Visit (INDEPENDENT_AMBULATORY_CARE_PROVIDER_SITE_OTHER)
Admission: RE | Admit: 2016-04-06 | Discharge: 2016-04-06 | Disposition: A | Discharge status: Home / Self Care | Payer: Medicare Other | Source: Ambulatory Visit | Attending: Family Medicine | Admitting: Family Medicine

## 2016-04-06 ENCOUNTER — Encounter: Payer: Self-pay | Admitting: Family Medicine

## 2016-04-06 VITALS — BP 114/58 | HR 68 | Temp 97.4°F | Wt 194.5 lb

## 2016-04-06 DIAGNOSIS — Z23 Encounter for immunization: Secondary | ICD-10-CM

## 2016-04-06 DIAGNOSIS — R0600 Dyspnea, unspecified: Secondary | ICD-10-CM | POA: Diagnosis not present

## 2016-04-06 DIAGNOSIS — J690 Pneumonitis due to inhalation of food and vomit: Secondary | ICD-10-CM

## 2016-04-06 DIAGNOSIS — K5900 Constipation, unspecified: Secondary | ICD-10-CM

## 2016-04-06 DIAGNOSIS — E538 Deficiency of other specified B group vitamins: Secondary | ICD-10-CM

## 2016-04-06 DIAGNOSIS — R5381 Other malaise: Secondary | ICD-10-CM | POA: Diagnosis not present

## 2016-04-06 DIAGNOSIS — R0603 Acute respiratory distress: Secondary | ICD-10-CM

## 2016-04-06 MED ORDER — CYANOCOBALAMIN 1000 MCG/ML IJ SOLN
1000.0000 ug | Freq: Once | INTRAMUSCULAR | Status: AC
  Administered 2016-04-06: 1000 ug via INTRAMUSCULAR

## 2016-04-06 NOTE — Progress Notes (Signed)
Pre visit review using our clinic review tool, if applicable. No additional management support is needed unless otherwise documented below in the visit note. 

## 2016-04-06 NOTE — Assessment & Plan Note (Signed)
Continue pt/ot.

## 2016-04-06 NOTE — Assessment & Plan Note (Signed)
Continue miralax. Hopefully d/c'ing elavil will help as well. Call or return to clinic prn if these symptoms worsen or fail to improve as anticipated. The patient indicates understanding of these issues and agrees with the plan.

## 2016-04-06 NOTE — Progress Notes (Signed)
Subjective:   Patient ID: Nathan Cunningham, male    DOB: July 20, 1925, 82 y.o.   MRN: [id number]  Nathan Cunningham is a pleasant 82 y.o. year old male who presents to clinic today with Follow-up (recently released from [place])  on 04/06/2016  HPI:  Admitted to [facility] 726- 7/17 after presenting to the ER with fall and generalized weakness.  Initial evaluation concerning for CAP.  Admitted, placed on supplemental O2, and empiric Unasyn to cover possible aspiration PNA pathogens.  Blood cx neg. Legionella ab neg Transitioned to oral Augmentin prior to discharge. Also weaned off O2. Given rx for lasix twice weekly as well.  Urine cx did grow Pan positive Ecoli.  Due to frequent falls, discharged to [facility]. Discharged home last Friday.  Doing well.  Has PT/OT.  Feeling better.  Still struggling with constipation.  Daily miralax has helped.  EXAM: CHEST  2 VIEW COMPARISON:  PA and lateral chest x-ray dated September 17, 2015 FINDINGS: The lungs are mildly hypoinflated. There is dense infiltrate at the right lung base posteriorly. There are small bilateral pleural effusions. The heart is normal in size. The pulmonary vascularity is not engorged. The bony thorax exhibits no acute abnormality. IMPRESSION: Bilateral hypo inflation. Infiltrate in the right lower lobe worrisome for pneumonia. Small bilateral pleural effusions. Mild cardiomegaly without significant pulmonary vascular congestion. Followup PA and lateral chest X-ray is recommended in 3-4 weeks following trial of antibiotic therapy to ensure resolution and exclude underlying malignancy. Electronically Signed   By: David  Martinique M.D.   On: 03/03/2016 09:59  Lab Results  Component Value Date   WBC 7.1 03/07/2016   HGB 11.5 (L) 03/07/2016   HCT 36.8 (L) 03/07/2016   MCV 95.6 03/07/2016   PLT 142 (L) 03/07/2016    Current Outpatient Prescriptions on File Prior to Visit  Medication Sig Dispense Refill  .  aspirin 325 MG tablet Take 162 mg by mouth daily.    . Chromium Picolinate (RA CHROMIUM PICOLINATE) 400 MCG TABS Take 1 tablet by mouth daily.      . cyanocobalamin (,VITAMIN B-12,) 1000 MCG/ML injection Inject 1074mcg every 60days IM (every other month) 1 mL 0  . dorzolamide-timolol (COSOPT) 22.3-6.8 MG/ML ophthalmic solution Place 1 drop into both eyes 2 (two) times daily.     . fish oil-omega-3 fatty acids 1000 MG capsule Take 2 g by mouth 3 (three) times daily. Take six daily    . furosemide (LASIX) 40 MG tablet Take 1 tablet (40 mg total) by mouth every Wednesday and Saturday. 10 tablet 0  . glipiZIDE (GLUCOTROL XL) 5 MG 24 hr tablet Take 1 tablet (5 mg total) by mouth daily. 90 tablet 1  . hydrocortisone (ANUSOL-HC) 25 MG suppository Place 1 suppository (25 mg total) rectally 2 (two) times daily. 12 suppository 0  . lactobacillus (FLORANEX/LACTINEX) PACK Take 1 packet (1 g total) by mouth 3 (three) times daily with meals. 30 packet 0  . Magnesium-Calcium-Folic Acid (MAGNEBIND A999333) 400-200-1 MG TABS Take 1 tablet by mouth daily.      . Melatonin 5 MG CAPS Take 1 each by mouth at bedtime.     . Multiple Vitamins-Minerals (EYE-VITES) TABS Take 1 tablet by mouth 2 (two) times daily.      . pramoxine (PROCTOFOAM) 1 % foam Place rectally 3 (three) times daily as needed for itching. 15 g 0  . SANTYL ointment Apply 1 application topically daily.  0  . Saxagliptin-Metformin (KOMBIGLYZE XR) 12-998 MG TB24  Take 1 each by mouth daily. 90 tablet 1  . senna-docusate (SENOKOT-S) 8.6-50 MG tablet Take 2 tablets by mouth 2 (two) times daily. 30 tablet 0  . sitaGLIPtin (JANUVIA) 100 MG tablet Take 1 tablet (100 mg total) by mouth daily. 90 tablet 3  . tamsulosin (FLOMAX) 0.4 MG CAPS capsule Take 0.4 mg by mouth daily.  3  . telmisartan (MICARDIS) 80 MG tablet TAKE 1 TABLET DAILY 90 tablet 3   No current facility-administered medications on file prior to visit.     No Known Allergies  Past Medical  History:  Diagnosis Date  . Adenomatous polyp of colon 03/2003  . Anemia   . Constipation   . Diverticulosis   . Dizziness   . ETOH abuse    binge drinking  . Hemorrhoids   . Hyperlipidemia   . Hypertension   . Macular degeneration    Right eye  . OSA on CPAP   . Type II diabetes mellitus ([place])     Past Surgical History:  Procedure Laterality Date  . ANTERIOR CERVICAL DECOMP/DISCECTOMY FUSION    . APPENDECTOMY  1935  . BACK SURGERY    . CATARACT EXTRACTION Left   . INGUINAL HERNIA REPAIR  1935  . TONSILLECTOMY      Family History  Problem Relation Age of Onset  . Heart disease Father   . Stroke Father 10    Social History   Social History  . Marital status: Divorced    Spouse name: N/A  . Number of children: 2  . Years of education: N/A   Occupational History  . retired    Social History Main Topics  . Smoking status: Never Smoker  . Smokeless tobacco: Never Used  . Alcohol use 12.6 oz/week    14 Standard drinks or equivalent, 7 Shots of liquor per week     Comment: "1 mixed drink/day"  . Drug use: No  . Sexual activity: No   Other Topics Concern  . Not on file   Social History Narrative   Retired from [employer]   Goes to [place] sneakers 3 times a week.   Has a girlfriend.   Does not have a living will yet.   Desires CPR, does not prolonged life support if futile.         The PMH, PSH, Social History, Family History, Medications, and allergies have been reviewed in [facility], and have been updated if relevant.   Review of Systems  Eyes: Negative.   Respiratory: Negative.   Cardiovascular: Negative.   Gastrointestinal: Positive for constipation.  Endocrine: Negative.   Genitourinary: Negative.   Musculoskeletal: Negative.   Allergic/Immunologic: Negative.   Neurological: Positive for weakness. Negative for tremors and syncope.  Psychiatric/Behavioral: Negative.   All other systems reviewed and are negative.      Objective:    BP (!)  114/58   Pulse 68   Temp 97.4 F (36.3 C) (Oral)   Wt 194 lb 8 oz (88.2 kg)   SpO2 92%   BMI 29.57 kg/m  Wt Readings from Last 3 Encounters:  04/06/16 194 lb 8 oz (88.2 kg)  03/08/16 205 lb 4.8 oz (93.1 kg)  02/19/16 201 lb 8 oz (91.4 kg)     Physical Exam  Constitutional: He is oriented to person, place, and time. He appears well-developed and well-nourished. No distress.  HENT:  Head: Normocephalic.  Eyes: Conjunctivae are normal.  Cardiovascular: Normal rate.   Pulmonary/Chest: Effort normal and breath  sounds normal. No respiratory distress. He has no wheezes. He has no rales.  Musculoskeletal: Normal range of motion.  Neurological: He is alert and oriented to person, place, and time. No cranial nerve deficit.  Skin: Skin is warm and dry. He is not diaphoretic.  Psychiatric: He has a normal mood and affect. His behavior is normal. Judgment and thought content normal.  Nursing note and vitals reviewed.         Assessment & Plan:   Physical deconditioning  Aspiration pneumonia, unspecified aspiration pneumonia type, unspecified laterality, unspecified part of lung ([place]) - Plan: DG Chest 2 View  Acute respiratory distress ([place])  Need for influenza vaccination - Plan: Flu Vaccine QUAD 36+ mos PF IM (Fluarix & Fluzone Quad PF) No Follow-up on file.

## 2016-04-06 NOTE — Assessment & Plan Note (Signed)
S/p abx.  Continue PT/OT for deconditioning. CXR today. The patient indicates understanding of these issues and agrees with the plan.

## 2016-04-20 ENCOUNTER — Ambulatory Visit (INDEPENDENT_AMBULATORY_CARE_PROVIDER_SITE_OTHER): Payer: Medicare Other | Admitting: Family Medicine

## 2016-04-20 ENCOUNTER — Ambulatory Visit (INDEPENDENT_AMBULATORY_CARE_PROVIDER_SITE_OTHER)
Admission: RE | Admit: 2016-04-20 | Discharge: 2016-04-20 | Disposition: A | Discharge status: Home / Self Care | Payer: Medicare Other | Source: Ambulatory Visit | Attending: Family Medicine | Admitting: Family Medicine

## 2016-04-20 ENCOUNTER — Encounter: Payer: Self-pay | Admitting: Family Medicine

## 2016-04-20 VITALS — BP 92/42 | HR 71 | Temp 98.2°F | Wt 191.5 lb

## 2016-04-20 DIAGNOSIS — J690 Pneumonitis due to inhalation of food and vomit: Secondary | ICD-10-CM

## 2016-04-20 DIAGNOSIS — K5900 Constipation, unspecified: Secondary | ICD-10-CM

## 2016-04-20 DIAGNOSIS — J90 Pleural effusion, not elsewhere classified: Secondary | ICD-10-CM | POA: Diagnosis not present

## 2016-04-20 DIAGNOSIS — J189 Pneumonia, unspecified organism: Secondary | ICD-10-CM | POA: Diagnosis not present

## 2016-04-20 DIAGNOSIS — R5381 Other malaise: Secondary | ICD-10-CM | POA: Diagnosis not present

## 2016-04-20 DIAGNOSIS — G4733 Obstructive sleep apnea (adult) (pediatric): Secondary | ICD-10-CM | POA: Diagnosis not present

## 2016-04-20 DIAGNOSIS — I10 Essential (primary) hypertension: Secondary | ICD-10-CM

## 2016-04-20 MED ORDER — TELMISARTAN 40 MG PO TABS: ORAL_TABLET | ORAL | 3 refills | Status: DC

## 2016-04-20 NOTE — Progress Notes (Signed)
Subjective:   Patient ID: Nathan Cunningham, male    DOB: 1925/01/24, 82 y.o.   MRN: [id number]  Nathan Cunningham is a pleasant 82 y.o. year old male who presents to clinic today with Follow-up  on 04/20/2016  HPI:  3 week follow up.  Saw him on 04/06/16 for hospital/ SNF follow up:  Admitted to [facility] 7/16- 7/17 after presenting to the ER with fall and generalized weakness.  Initial evaluation concerning for CAP.  Admitted, placed on supplemental O2, and empiric Unasyn to cover possible aspiration PNA pathogens.  Blood cx neg. Legionella ab neg Transitioned to oral Augmentin prior to discharge. Also weaned off O2. Given rx for lasix twice weekly as well.  Urine cx did grow Pan positive Ecoli.  Due to frequent falls, discharged to [facility]. Discharged home last Friday.  Doing well.  Has PT/OT.   Repeat CXR showed improvement. Dg Chest 2 View  Result Date: 04/06/2016 CLINICAL DATA:  Followup the aspiration pneumonia, hypertension, type II diabetes mellitus EXAM: CHEST  2 VIEW COMPARISON:  03/03/2016 FINDINGS: Enlargement of cardiac silhouette. Mediastinal contours and pulmonary vascularity normal. Bibasilar atelectasis and small RIGHT pleural effusion. Improved aeration at both lung bases since previous exam, especially on RIGHT. Underlying emphysematous and minimal bronchitic changes. Remaining lungs clear. No pneumothorax. Bones demineralized. IMPRESSION: Improved bibasilar aeration with mild residual bibasilar atelectasis and decreased small RIGHT pleural effusion. Electronically Signed   By: Lavonia Dana M.D.   On: 04/06/2016 12:50   Was also complaining of constipation.  Elavil d/c'd, advised to continue miralax.  This has improved.  Having low blood pressure readings and dizziness when standing.  BP Readings from Last 3 Encounters:  04/20/16 (!) 92/42  04/06/16 (!) 114/58  03/08/16 (!) 100/41     Lab Results  Component Value Date   WBC 7.1 03/07/2016   HGB 11.5  (L) 03/07/2016   HCT 36.8 (L) 03/07/2016   MCV 95.6 03/07/2016   PLT 142 (L) 03/07/2016    Current Outpatient Prescriptions on File Prior to Visit  Medication Sig Dispense Refill  . aspirin 325 MG tablet Take 162 mg by mouth daily.    . Chromium Picolinate (RA CHROMIUM PICOLINATE) 400 MCG TABS Take 1 tablet by mouth daily.      . cyanocobalamin (,VITAMIN B-12,) 1000 MCG/ML injection Inject 1040mcg every 60days IM (every other month) 1 mL 0  . dorzolamide-timolol (COSOPT) 22.3-6.8 MG/ML ophthalmic solution Place 1 drop into both eyes 2 (two) times daily.     . fish oil-omega-3 fatty acids 1000 MG capsule Take 2 g by mouth 3 (three) times daily. Take six daily    . furosemide (LASIX) 40 MG tablet Take 1 tablet (40 mg total) by mouth every Wednesday and Saturday. 10 tablet 0  . hydrocortisone (ANUSOL-HC) 25 MG suppository Place 1 suppository (25 mg total) rectally 2 (two) times daily. 12 suppository 0  . lactobacillus (FLORANEX/LACTINEX) PACK Take 1 packet (1 g total) by mouth 3 (three) times daily with meals. 30 packet 0  . Magnesium-Calcium-Folic Acid (MAGNEBIND A999333) 400-200-1 MG TABS Take 1 tablet by mouth daily.      . Melatonin 5 MG CAPS Take 1 each by mouth at bedtime.     . Multiple Vitamins-Minerals (EYE-VITES) TABS Take 1 tablet by mouth 2 (two) times daily.      . pramoxine (PROCTOFOAM) 1 % foam Place rectally 3 (three) times daily as needed for itching. 15 g 0  . SANTYL ointment Apply 1  application topically daily.  0  . Saxagliptin-Metformin (KOMBIGLYZE XR) 12-998 MG TB24 Take 1 each by mouth daily. 90 tablet 1  . senna-docusate (SENOKOT-S) 8.6-50 MG tablet Take 2 tablets by mouth 2 (two) times daily. 30 tablet 0  . sitaGLIPtin (JANUVIA) 100 MG tablet Take 1 tablet (100 mg total) by mouth daily. 90 tablet 3  . tamsulosin (FLOMAX) 0.4 MG CAPS capsule Take 0.4 mg by mouth daily.  3  . telmisartan (MICARDIS) 80 MG tablet TAKE 1 TABLET DAILY 90 tablet 3   No current  facility-administered medications on file prior to visit.     No Known Allergies  Past Medical History:  Diagnosis Date  . Adenomatous polyp of colon 03/2003  . Anemia   . Constipation   . Diverticulosis   . Dizziness   . ETOH abuse    binge drinking  . Hemorrhoids   . Hyperlipidemia   . Hypertension   . Macular degeneration    Right eye  . OSA on CPAP   . Type II diabetes mellitus ([place])     Past Surgical History:  Procedure Laterality Date  . ANTERIOR CERVICAL DECOMP/DISCECTOMY FUSION    . APPENDECTOMY  1935  . BACK SURGERY    . CATARACT EXTRACTION Left   . INGUINAL HERNIA REPAIR  1935  . TONSILLECTOMY      Family History  Problem Relation Age of Onset  . Heart disease Father   . Stroke Father 22    Social History   Social History  . Marital status: Divorced    Spouse name: N/A  . Number of children: 2  . Years of education: N/A   Occupational History  . retired    Social History Main Topics  . Smoking status: Never Smoker  . Smokeless tobacco: Never Used  . Alcohol use 12.6 oz/week    14 Standard drinks or equivalent, 7 Shots of liquor per week     Comment: "1 mixed drink/day"  . Drug use: No  . Sexual activity: No   Other Topics Concern  . Not on file   Social History Narrative   Retired from [employer]   Goes to [place] sneakers 3 times a week.   Has a girlfriend.   Does not have a living will yet.   Desires CPR, does not prolonged life support if futile.         The PMH, PSH, Social History, Family History, Medications, and allergies have been reviewed in [facility], and have been updated if relevant.   Review of Systems  Eyes: Negative.   Respiratory: Negative.   Cardiovascular: Negative.   Gastrointestinal: Positive for constipation.  Endocrine: Negative.   Genitourinary: Negative.   Musculoskeletal: Negative.   Allergic/Immunologic: Negative.   Neurological: Positive for weakness. Negative for tremors and syncope.    Psychiatric/Behavioral: Negative.   All other systems reviewed and are negative.      Objective:    BP (!) 92/42 Comment: 109/49 electronic  Pulse 71   Temp 98.2 F (36.8 C) (Oral)   Wt 191 lb 8 oz (86.9 kg)   SpO2 94%   BMI 29.12 kg/m    BP Readings from Last 3 Encounters:  04/20/16 (!) 92/42  04/06/16 (!) 114/58  03/08/16 (!) 100/41    Wt Readings from Last 3 Encounters:  04/20/16 191 lb 8 oz (86.9 kg)  04/06/16 194 lb 8 oz (88.2 kg)  03/08/16 205 lb 4.8 oz (93.1 kg)     Physical  Exam  Constitutional: He is oriented to person, place, and time. He appears well-developed and well-nourished. No distress.  HENT:  Head: Normocephalic.  Eyes: Conjunctivae are normal.  Cardiovascular: Normal rate.   Pulmonary/Chest: Effort normal and breath sounds normal. No respiratory distress. He has no wheezes. He has no rales.  Musculoskeletal: Normal range of motion.  Neurological: He is alert and oriented to person, place, and time. No cranial nerve deficit.  Skin: Skin is warm and dry. He is not diaphoretic.  Psychiatric: He has a normal mood and affect. His behavior is normal. Judgment and thought content normal.  Nursing note and vitals reviewed.         Assessment & Plan:   Aspiration pneumonia, unspecified aspiration pneumonia type, unspecified laterality, unspecified part of lung ([place])  Physical deconditioning  CAP (community acquired pneumonia)  Constipation, unspecified constipation type No Follow-up on file.

## 2016-04-20 NOTE — Assessment & Plan Note (Signed)
Now with hypotension. Decrease Micardis to 40 mg daily. Follow up in 2 weeks or check BP at home and call me. The patient and his wife indicate understanding of these issues and agrees with the plan.

## 2016-04-20 NOTE — Assessment & Plan Note (Signed)
Improving. Continue current rxs.

## 2016-04-20 NOTE — Assessment & Plan Note (Signed)
Exam reassuring. Repeat CXR today to confirm resolution. The patient indicates understanding of these issues and agrees with the plan.

## 2016-04-20 NOTE — Progress Notes (Signed)
Pre visit review using our clinic review tool, if applicable. No additional management support is needed unless otherwise documented below in the visit note. s

## 2016-04-20 NOTE — Patient Instructions (Signed)
Good to see you. Lets decrease your Micardis to 40 mg daily. Pease check your blood pressures at home or come see me in 2 weeks.  I will call you with your results from today.

## 2016-05-04 ENCOUNTER — Encounter: Payer: Self-pay | Admitting: Family Medicine

## 2016-05-04 ENCOUNTER — Ambulatory Visit (INDEPENDENT_AMBULATORY_CARE_PROVIDER_SITE_OTHER): Payer: Medicare Other | Admitting: Family Medicine

## 2016-05-04 VITALS — BP 122/64 | HR 70 | Temp 98.3°F | Wt 189.5 lb

## 2016-05-04 DIAGNOSIS — R42 Dizziness and giddiness: Secondary | ICD-10-CM

## 2016-05-04 DIAGNOSIS — I10 Essential (primary) hypertension: Secondary | ICD-10-CM | POA: Diagnosis not present

## 2016-05-04 DIAGNOSIS — K5900 Constipation, unspecified: Secondary | ICD-10-CM | POA: Diagnosis not present

## 2016-05-04 DIAGNOSIS — E1142 Type 2 diabetes mellitus with diabetic polyneuropathy: Secondary | ICD-10-CM | POA: Diagnosis not present

## 2016-05-04 DIAGNOSIS — R0603 Acute respiratory distress: Secondary | ICD-10-CM

## 2016-05-04 DIAGNOSIS — B351 Tinea unguium: Secondary | ICD-10-CM | POA: Diagnosis not present

## 2016-05-04 DIAGNOSIS — R0600 Dyspnea, unspecified: Secondary | ICD-10-CM

## 2016-05-04 DIAGNOSIS — J690 Pneumonitis due to inhalation of food and vomit: Secondary | ICD-10-CM

## 2016-05-04 NOTE — Assessment & Plan Note (Signed)
Continues to improve 

## 2016-05-04 NOTE — Assessment & Plan Note (Signed)
Improving with miralax. No changes made today.

## 2016-05-04 NOTE — Assessment & Plan Note (Signed)
Hypotension resolved. BP well controlled with lower dose of micardis. Continue this dose, no changes made today.

## 2016-05-04 NOTE — Progress Notes (Signed)
Subjective:   Patient ID: Nathan Cunningham, male    DOB: 11/05/1924, 82 y.o.   MRN: [id number]  Nathan Cunningham is a pleasant 82 y.o. year old male who presents to clinic today with Follow-up (2wk) and Constipation  on 05/04/2016  HPI:   Saw him on 04/06/16 for hospital/ SNF follow up:  Admitted to [facility] 7/16- 7/17 after presenting to the ER with fall and generalized weakness.  Initial evaluation concerning for CAP.  Admitted, placed on supplemental O2, and empiric Unasyn to cover possible aspiration PNA pathogens.  Blood cx neg. Legionella ab neg Transitioned to oral Augmentin prior to discharge. Also weaned off O2. Given rx for lasix twice weekly as well.  Urine cx did grow Pan positive Ecoli.  Due to frequent falls, discharged to [facility]. Discharged home last Friday.  Doing well.  Has PT/OT.   Repeat CXRs  showed improvement. Dg Chest 2 View  Result Date: 04/20/2016 CLINICAL DATA:  Follow-up community-acquired pneumonia. History of asthma -COPD, unexplained weight loss, former smoker, diabetes. EXAM: CHEST  2 VIEW COMPARISON:  PA and lateral chest x-ray of April 06, 2016 FINDINGS: The lungs are well-expanded. There is bibasilar atelectasis with small bilateral pleural effusions. The findings are slightly less conspicuous today than on the previous study. The cardiac silhouette is top-normal in size but stable. The pulmonary vascularity is not engorged. There is calcification within the wall of the aortic arch. There is calcification of portions of the anterior longitudinal ligament of the thoracic spine. IMPRESSION: Further interval improvement in the appearance of both lungs with decreased basilar atelectasis or infiltrate. Small bilateral pleural effusions remain. Aortic atherosclerosis. Electronically Signed   By: David  Martinique M.D.   On: 04/20/2016 13:02   Dg Chest 2 View  Result Date: 04/06/2016 CLINICAL DATA:  Followup the aspiration pneumonia, hypertension, type  II diabetes mellitus EXAM: CHEST  2 VIEW COMPARISON:  03/03/2016 FINDINGS: Enlargement of cardiac silhouette. Mediastinal contours and pulmonary vascularity normal. Bibasilar atelectasis and small RIGHT pleural effusion. Improved aeration at both lung bases since previous exam, especially on RIGHT. Underlying emphysematous and minimal bronchitic changes. Remaining lungs clear. No pneumothorax. Bones demineralized. IMPRESSION: Improved bibasilar aeration with mild residual bibasilar atelectasis and decreased small RIGHT pleural effusion. Electronically Signed   By: Lavonia Dana M.D.   On: 04/06/2016 12:50   Was also complaining of constipation.  Elavil d/c'd, advised to continue miralax.  This has improved.  Having low blood pressure readings and dizziness when standing when I saw him two weeks ago so we decreased his Micardis to 40 mg daily and advised him to return today for blood pressure recheck.  Dizziness has improved .  BP Readings from Last 3 Encounters:  05/04/16 122/64  04/20/16 (!) 92/42  04/06/16 (!) 114/58     Lab Results  Component Value Date   WBC 7.1 03/07/2016   HGB 11.5 (L) 03/07/2016   HCT 36.8 (L) 03/07/2016   MCV 95.6 03/07/2016   PLT 142 (L) 03/07/2016    Current Outpatient Prescriptions on File Prior to Visit  Medication Sig Dispense Refill  . aspirin 325 MG tablet Take 162 mg by mouth daily.    . Chromium Picolinate (RA CHROMIUM PICOLINATE) 400 MCG TABS Take 1 tablet by mouth daily.      . cyanocobalamin (,VITAMIN B-12,) 1000 MCG/ML injection Inject 1056mcg every 60days IM (every other month) 1 mL 0  . dorzolamide-timolol (COSOPT) 22.3-6.8 MG/ML ophthalmic solution Place 1 drop into both eyes 2 (  two) times daily.     . fish oil-omega-3 fatty acids 1000 MG capsule Take 2 g by mouth 3 (three) times daily. Take six daily    . Magnesium-Calcium-Folic Acid (MAGNEBIND A999333) 400-200-1 MG TABS Take 1 tablet by mouth daily.      . Melatonin 5 MG CAPS Take 1 each by mouth at  bedtime.     . Multiple Vitamins-Minerals (EYE-VITES) TABS Take 1 tablet by mouth 2 (two) times daily.      Marland Kitchen SANTYL ointment Apply 1 application topically daily.  0  . Saxagliptin-Metformin (KOMBIGLYZE XR) 12-998 MG TB24 Take 1 each by mouth daily. 90 tablet 1  . sitaGLIPtin (JANUVIA) 100 MG tablet Take 1 tablet (100 mg total) by mouth daily. 90 tablet 3  . tamsulosin (FLOMAX) 0.4 MG CAPS capsule Take 0.4 mg by mouth daily.  3  . telmisartan (MICARDIS) 40 MG tablet TAKE 1 TABLET DAILY 90 tablet 3   No current facility-administered medications on file prior to visit.     No Known Allergies  Past Medical History:  Diagnosis Date  . Adenomatous polyp of colon 03/2003  . Anemia   . Constipation   . Diverticulosis   . Dizziness   . ETOH abuse    binge drinking  . Hemorrhoids   . Hyperlipidemia   . Hypertension   . Macular degeneration    Right eye  . OSA on CPAP   . Type II diabetes mellitus ([place])     Past Surgical History:  Procedure Laterality Date  . ANTERIOR CERVICAL DECOMP/DISCECTOMY FUSION    . APPENDECTOMY  1935  . BACK SURGERY    . CATARACT EXTRACTION Left   . INGUINAL HERNIA REPAIR  1935  . TONSILLECTOMY      Family History  Problem Relation Age of Onset  . Heart disease Father   . Stroke Father 26    Social History   Social History  . Marital status: Divorced    Spouse name: N/A  . Number of children: 2  . Years of education: N/A   Occupational History  . retired    Social History Main Topics  . Smoking status: Never Smoker  . Smokeless tobacco: Never Used  . Alcohol use 12.6 oz/week    14 Standard drinks or equivalent, 7 Shots of liquor per week     Comment: "1 mixed drink/day"  . Drug use: No  . Sexual activity: No   Other Topics Concern  . Not on file   Social History Narrative   Retired from [employer]   Goes to [place] sneakers 3 times a week.   Has a girlfriend.   Does not have a living will yet.   Desires CPR, does not  prolonged life support if futile.         The PMH, PSH, Social History, Family History, Medications, and allergies have been reviewed in [facility], and have been updated if relevant.   Review of Systems  Eyes: Negative.   Respiratory: Negative.   Cardiovascular: Negative.   Gastrointestinal: Positive for constipation.  Endocrine: Negative.   Genitourinary: Negative.   Musculoskeletal: Negative.   Allergic/Immunologic: Negative.   Neurological: Positive for weakness. Negative for dizziness, tremors and syncope.  Psychiatric/Behavioral: Negative.   All other systems reviewed and are negative.      Objective:    BP 122/64   Pulse 70   Temp 98.3 F (36.8 C) (Oral)   Wt 189 lb 8 oz (86 kg)  SpO2 92%   BMI 28.81 kg/m    BP Readings from Last 3 Encounters:  05/04/16 122/64  04/20/16 (!) 92/42  04/06/16 (!) 114/58    Wt Readings from Last 3 Encounters:  05/04/16 189 lb 8 oz (86 kg)  04/20/16 191 lb 8 oz (86.9 kg)  04/06/16 194 lb 8 oz (88.2 kg)     Physical Exam  Constitutional: He is oriented to person, place, and time. He appears well-developed and well-nourished. No distress.  HENT:  Head: Normocephalic.  Eyes: Conjunctivae are normal.  Cardiovascular: Normal rate.   Pulmonary/Chest: Effort normal and breath sounds normal. No respiratory distress. He has no wheezes. He has no rales.  Musculoskeletal: Normal range of motion.  Neurological: He is alert and oriented to person, place, and time. No cranial nerve deficit.  Skin: Skin is warm and dry. He is not diaphoretic.  Psychiatric: He has a normal mood and affect. His behavior is normal. Judgment and thought content normal.  Nursing note and vitals reviewed.         Assessment & Plan:   Dizziness and giddiness  Acute respiratory distress (HCC)  Essential hypertension  Constipation, unspecified constipation type No Follow-up on file.

## 2016-05-04 NOTE — Progress Notes (Signed)
Pre visit review using our clinic review tool, if applicable. No additional management support is needed unless otherwise documented below in the visit note. 

## 2016-06-04 ENCOUNTER — Ambulatory Visit (INDEPENDENT_AMBULATORY_CARE_PROVIDER_SITE_OTHER): Payer: Medicare Other | Admitting: Family Medicine

## 2016-06-04 DIAGNOSIS — E538 Deficiency of other specified B group vitamins: Secondary | ICD-10-CM | POA: Diagnosis not present

## 2016-06-04 DIAGNOSIS — E559 Vitamin D deficiency, unspecified: Secondary | ICD-10-CM

## 2016-06-04 MED ORDER — CYANOCOBALAMIN 1000 MCG/ML IJ SOLN
1000.0000 ug | Freq: Once | INTRAMUSCULAR | Status: AC
  Administered 2016-06-04: 1000 ug via INTRAMUSCULAR

## 2016-06-07 DIAGNOSIS — E538 Deficiency of other specified B group vitamins: Secondary | ICD-10-CM | POA: Insufficient documentation

## 2016-06-07 NOTE — Progress Notes (Signed)
Received IM B12 in office.

## 2016-07-06 ENCOUNTER — Encounter: Payer: Self-pay | Admitting: Family Medicine

## 2016-07-06 ENCOUNTER — Ambulatory Visit (INDEPENDENT_AMBULATORY_CARE_PROVIDER_SITE_OTHER): Payer: Medicare Other | Admitting: Family Medicine

## 2016-07-06 VITALS — BP 126/60 | HR 66 | Temp 97.4°F | Wt 186.5 lb

## 2016-07-06 DIAGNOSIS — E538 Deficiency of other specified B group vitamins: Secondary | ICD-10-CM | POA: Diagnosis not present

## 2016-07-06 DIAGNOSIS — G4733 Obstructive sleep apnea (adult) (pediatric): Secondary | ICD-10-CM | POA: Insufficient documentation

## 2016-07-06 DIAGNOSIS — I10 Essential (primary) hypertension: Secondary | ICD-10-CM | POA: Diagnosis not present

## 2016-07-06 DIAGNOSIS — E118 Type 2 diabetes mellitus with unspecified complications: Secondary | ICD-10-CM

## 2016-07-06 LAB — COMPREHENSIVE METABOLIC PANEL
ALBUMIN: 3.5 g/dL (ref 3.5–5.2)
ALK PHOS: 48 U/L (ref 39–117)
ALT: 9 U/L (ref 0–53)
AST: 8 U/L (ref 0–37)
BILIRUBIN TOTAL: 0.5 mg/dL (ref 0.2–1.2)
BUN: 15 mg/dL (ref 6–23)
CALCIUM: 9.1 mg/dL (ref 8.4–10.5)
CHLORIDE: 101 meq/L (ref 96–112)
CO2: 32 mEq/L (ref 19–32)
CREATININE: 0.8 mg/dL (ref 0.40–1.50)
GFR: 96.73 mL/min (ref >60.00)
Glucose, Bld: 176 mg/dL — ABNORMAL HIGH (ref 70–99)
Potassium: 4.6 mEq/L (ref 3.5–5.1)
SODIUM: 136 meq/L (ref 135–145)
TOTAL PROTEIN: 7.3 g/dL (ref 6.0–8.3)

## 2016-07-06 LAB — LIPID PANEL
CHOLESTEROL: 134 mg/dL (ref 0–200)
HDL: 35.4 mg/dL — ABNORMAL LOW (ref >39.00)
LDL CALC: 71 mg/dL (ref 0–99)
NonHDL: 98.9
TRIGLYCERIDES: 138 mg/dL (ref 0.0–149.0)
Total CHOL/HDL Ratio: 4
VLDL: 27.6 mg/dL (ref 0.0–40.0)

## 2016-07-06 LAB — HEMOGLOBIN A1C: Hgb A1c MFr Bld: 7.2 % — ABNORMAL HIGH (ref 4.6–6.5)

## 2016-07-06 LAB — VITAMIN B12: VITAMIN B 12: 552 pg/mL (ref 211–911)

## 2016-07-06 NOTE — Patient Instructions (Signed)
Great to see you. Happy Holidays.  We will call you with your results.

## 2016-07-06 NOTE — Assessment & Plan Note (Signed)
Repeat B12 today. Does receive IM B12 here.

## 2016-07-06 NOTE — Assessment & Plan Note (Signed)
Continue current rxs. Check a1c today. 

## 2016-07-06 NOTE — Assessment & Plan Note (Signed)
Well controlled.  No changes made. 

## 2016-07-06 NOTE — Progress Notes (Signed)
Subjective:   Patient ID: HEZIKIAH WOODHAMS, male    DOB: 1924-12-13, 82 y.o.   MRN: [id number]  NISSIM LIAS is a pleasant 82 y.o. year old male who presents to clinic today with Follow-up and Blister (R great toe)  on 07/06/2016  HPI:   Vit B12 def- received IM B12 last month. Lab Results  Component Value Date   VITAMINB12 360 03/03/2016     HTN- has been well controlled on lower dose of Micardis ( decreased in 04/2016 due to sx of orthostasis). Denies dizziness, CP, HA or SOB.  Lab Results  Component Value Date   CREATININE 0.63 03/08/2016   BP Readings from Last 3 Encounters:  07/06/16 126/60  05/04/16 122/64  04/20/16 (!) 92/42   We also d/c'd his elavil due to constipation and constipation has improved with this and adding miralax.  DM- taking glipizide and Kombiglyze, and Januvia. No noticeable side effects.  Checking FSBS three times weekly. Denies any symptoms of hypoglycemia.  Attended diabetic teaching at [facility] and felt that it was very helpful.  Lab Results  Component Value Date   HGBA1C 6.2 (H) 03/03/2016   Lab Results  Component Value Date   CHOL 123 12/04/2015   HDL 30.90 (L) 12/04/2015   LDLCALC 53 12/04/2015   LDLDIRECT 81.0 02/04/2015   TRIG 199.0 (H) 12/04/2015   CHOLHDL 4 12/04/2015   Lab Results  Component Value Date   NA 135 03/08/2016   K 3.9 03/08/2016   CL 100 (L) 03/08/2016   CO2 29 03/08/2016   Current Outpatient Prescriptions on File Prior to Visit  Medication Sig Dispense Refill  . aspirin 325 MG tablet Take 162 mg by mouth daily.    . Chromium Picolinate (RA CHROMIUM PICOLINATE) 400 MCG TABS Take 1 tablet by mouth daily.      . cyanocobalamin (,VITAMIN B-12,) 1000 MCG/ML injection Inject 1056mcg every 60days IM (every other month) 1 mL 0  . dorzolamide-timolol (COSOPT) 22.3-6.8 MG/ML ophthalmic solution Place 1 drop into both eyes 2 (two) times daily.     . fish oil-omega-3 fatty acids 1000 MG capsule Take 2 g by mouth  3 (three) times daily. Take six daily    . Magnesium-Calcium-Folic Acid (MAGNEBIND A999333) 400-200-1 MG TABS Take 1 tablet by mouth daily.      . Melatonin 5 MG CAPS Take 1 each by mouth at bedtime.     . Multiple Vitamins-Minerals (EYE-VITES) TABS Take 1 tablet by mouth 2 (two) times daily.      . Saxagliptin-Metformin (KOMBIGLYZE XR) 12-998 MG TB24 Take 1 each by mouth daily. 90 tablet 1  . sitaGLIPtin (JANUVIA) 100 MG tablet Take 1 tablet (100 mg total) by mouth daily. 90 tablet 3  . tamsulosin (FLOMAX) 0.4 MG CAPS capsule Take 0.4 mg by mouth daily.  3  . telmisartan (MICARDIS) 40 MG tablet TAKE 1 TABLET DAILY 90 tablet 3   No current facility-administered medications on file prior to visit.     No Known Allergies  Past Medical History:  Diagnosis Date  . Adenomatous polyp of colon 03/2003  . Anemia   . Constipation   . Diverticulosis   . Dizziness   . ETOH abuse    binge drinking  . Hemorrhoids   . Hyperlipidemia   . Hypertension   . Macular degeneration    Right eye  . OSA on CPAP   . Type II diabetes mellitus ([place])     Past Surgical History:  Procedure  Laterality Date  . ANTERIOR CERVICAL DECOMP/DISCECTOMY FUSION    . APPENDECTOMY  1935  . BACK SURGERY    . CATARACT EXTRACTION Left   . INGUINAL HERNIA REPAIR  1935  . TONSILLECTOMY      Family History  Problem Relation Age of Onset  . Heart disease Father   . Stroke Father 26    Social History   Social History  . Marital status: Divorced    Spouse name: N/A  . Number of children: 2  . Years of education: N/A   Occupational History  . retired    Social History Main Topics  . Smoking status: Never Smoker  . Smokeless tobacco: Never Used  . Alcohol use 12.6 oz/week    14 Standard drinks or equivalent, 7 Shots of liquor per week     Comment: "1 mixed drink/day"  . Drug use: No  . Sexual activity: No   Other Topics Concern  . Not on file   Social History Narrative   Retired from [employer]   Goes to  [place] sneakers 3 times a week.   Has a girlfriend.   Does not have a living will yet.   Desires CPR, does not prolonged life support if futile.         The PMH, PSH, Social History, Family History, Medications, and allergies have been reviewed in [facility], and have been updated if relevant.   Review of Systems  Constitutional: Positive for fatigue. Negative for fever and unexpected weight change.  HENT: Negative.   Eyes: Negative.   Respiratory: Negative.   Cardiovascular: Negative.   Gastrointestinal: Negative.   Endocrine: Negative.   Genitourinary: Negative.   Musculoskeletal: Negative.   Allergic/Immunologic: Negative.   Neurological: Negative.   Hematological: Negative.   Psychiatric/Behavioral: Negative.   All other systems reviewed and are negative.      Objective:    BP 126/60   Pulse 66   Temp 97.4 F (36.3 C) (Oral)   Wt 186 lb 8 oz (84.6 kg)   SpO2 (!) 86%   BMI 28.36 kg/m    Physical Exam  Constitutional: He is oriented to person, place, and time. He appears well-developed and well-nourished. No distress.  HENT:  Head: Normocephalic and atraumatic.  Cardiovascular: Normal rate and regular rhythm.   Pulmonary/Chest: Effort normal and breath sounds normal.  Musculoskeletal: Normal range of motion. He exhibits no edema.  Neurological: He is alert and oriented to person, place, and time. No cranial nerve deficit.  Skin: Skin is dry. He is not diaphoretic.  Psychiatric: He has a normal mood and affect. His behavior is normal. Judgment and thought content normal.  Nursing note and vitals reviewed.         Assessment & Plan:   Diabetes mellitus with complication ([place]) - Plan: Hemoglobin A1c, Comprehensive metabolic panel, Lipid panel  Essential hypertension  Vitamin B12 deficiency No Follow-up on file.

## 2016-07-06 NOTE — Progress Notes (Signed)
Pre visit review using our clinic review tool, if applicable. No additional management support is needed unless otherwise documented below in the visit note. 

## 2016-07-07 ENCOUNTER — Ambulatory Visit: Payer: Medicare Other | Admitting: Family Medicine

## 2016-07-15 DIAGNOSIS — G4733 Obstructive sleep apnea (adult) (pediatric): Secondary | ICD-10-CM | POA: Diagnosis not present

## 2016-07-16 ENCOUNTER — Inpatient Hospital Stay: Payer: Medicare Other | Attending: Internal Medicine

## 2016-07-16 ENCOUNTER — Inpatient Hospital Stay (HOSPITAL_BASED_OUTPATIENT_CLINIC_OR_DEPARTMENT_OTHER): Payer: Medicare Other | Admitting: Internal Medicine

## 2016-07-16 DIAGNOSIS — Z7982 Long term (current) use of aspirin: Secondary | ICD-10-CM

## 2016-07-16 DIAGNOSIS — D472 Monoclonal gammopathy: Secondary | ICD-10-CM | POA: Insufficient documentation

## 2016-07-16 DIAGNOSIS — Z7984 Long term (current) use of oral hypoglycemic drugs: Secondary | ICD-10-CM | POA: Diagnosis not present

## 2016-07-16 DIAGNOSIS — Z86010 Personal history of colonic polyps: Secondary | ICD-10-CM

## 2016-07-16 DIAGNOSIS — E785 Hyperlipidemia, unspecified: Secondary | ICD-10-CM

## 2016-07-16 DIAGNOSIS — C9310 Chronic myelomonocytic leukemia not having achieved remission: Secondary | ICD-10-CM

## 2016-07-16 DIAGNOSIS — Z79899 Other long term (current) drug therapy: Secondary | ICD-10-CM

## 2016-07-16 DIAGNOSIS — C9250 Acute myelomonocytic leukemia, not having achieved remission: Secondary | ICD-10-CM

## 2016-07-16 DIAGNOSIS — I10 Essential (primary) hypertension: Secondary | ICD-10-CM

## 2016-07-16 DIAGNOSIS — Z8719 Personal history of other diseases of the digestive system: Secondary | ICD-10-CM | POA: Insufficient documentation

## 2016-07-16 DIAGNOSIS — K5900 Constipation, unspecified: Secondary | ICD-10-CM

## 2016-07-16 DIAGNOSIS — G4730 Sleep apnea, unspecified: Secondary | ICD-10-CM

## 2016-07-16 DIAGNOSIS — F1010 Alcohol abuse, uncomplicated: Secondary | ICD-10-CM | POA: Diagnosis not present

## 2016-07-16 DIAGNOSIS — D649 Anemia, unspecified: Secondary | ICD-10-CM | POA: Diagnosis not present

## 2016-07-16 DIAGNOSIS — E119 Type 2 diabetes mellitus without complications: Secondary | ICD-10-CM

## 2016-07-16 LAB — CBC WITH DIFFERENTIAL/PLATELET
BASOS ABS: 0 10*3/uL (ref 0–0.1)
BASOS PCT: 0 %
Eosinophils Absolute: 0 10*3/uL (ref 0–0.7)
Eosinophils Relative: 0 %
HEMATOCRIT: 36.1 % — AB (ref 40.0–52.0)
HEMOGLOBIN: 12 g/dL — AB (ref 13.0–18.0)
LYMPHS PCT: 27 %
Lymphs Abs: 1.5 10*3/uL (ref 1.0–3.6)
MCH: 30.2 pg (ref 26.0–34.0)
MCHC: 33.4 g/dL (ref 32.0–36.0)
MCV: 90.4 fL (ref 80.0–100.0)
MONOS PCT: 31 %
Monocytes Absolute: 1.8 10*3/uL — ABNORMAL HIGH (ref 0.2–1.0)
NEUTROS ABS: 2.4 10*3/uL (ref 1.4–6.5)
NEUTROS PCT: 42 %
Platelets: 184 10*3/uL (ref 150–440)
RBC: 3.99 MIL/uL — ABNORMAL LOW (ref 4.40–5.90)
RDW: 14.9 % — ABNORMAL HIGH (ref 11.5–14.5)
WBC: 5.7 10*3/uL (ref 3.8–10.6)

## 2016-07-16 LAB — COMPREHENSIVE METABOLIC PANEL
ALBUMIN: 3.6 g/dL (ref 3.5–5.0)
ALK PHOS: 51 U/L (ref 38–126)
ALT: 11 U/L — ABNORMAL LOW (ref 17–63)
AST: 13 U/L — AB (ref 15–41)
Anion gap: 4 — ABNORMAL LOW (ref 5–15)
BILIRUBIN TOTAL: 0.8 mg/dL (ref 0.3–1.2)
BUN: 19 mg/dL (ref 6–20)
CALCIUM: 8.8 mg/dL — AB (ref 8.9–10.3)
CO2: 30 mmol/L (ref 22–32)
Chloride: 99 mmol/L — ABNORMAL LOW (ref 101–111)
Creatinine, Ser: 0.78 mg/dL (ref 0.61–1.24)
GFR calc Af Amer: >60 mL/min (ref >60)
GFR calc non Af Amer: >60 mL/min (ref >60)
GLUCOSE: 180 mg/dL — AB (ref 65–99)
Potassium: 4.2 mmol/L (ref 3.5–5.1)
Sodium: 133 mmol/L — ABNORMAL LOW (ref 135–145)
TOTAL PROTEIN: 7.7 g/dL (ref 6.5–8.1)

## 2016-07-16 NOTE — Progress Notes (Signed)
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 He is alone.  Denies any new symptoms.  He denies any unusual night sweats or weight loss or fevers. No lumps or bumps. Denies any new onset of back pain.  REVIEW OF SYSTEMS:  A complete 10 point review of system is done which is negative except mentioned above/history of present illness.   PAST MEDICAL HISTORY :  Past Medical History:  Diagnosis Date  . Adenomatous polyp of colon 03/2003  . Anemia   . Constipation   . Diverticulosis   . Dizziness   . ETOH abuse    binge drinking  . Hemorrhoids   . Hyperlipidemia   . Hypertension   . Macular degeneration    Right eye  . OSA on CPAP   . Type II diabetes mellitus ([place])     PAST SURGICAL HISTORY :   Past Surgical History:  Procedure Laterality Date  . ANTERIOR CERVICAL DECOMP/DISCECTOMY FUSION    . APPENDECTOMY  1935  . BACK SURGERY    . CATARACT EXTRACTION Left   . INGUINAL HERNIA REPAIR  1935  . TONSILLECTOMY      FAMILY HISTORY :   Family History  Problem Relation Age of Onset  . Heart disease Father   . Stroke Father 27    SOCIAL HISTORY:   Social History  Substance Use Topics  . Smoking status: Never Smoker  .  Smokeless tobacco: Never Used  . Alcohol use 12.6 oz/week    14 Standard drinks or equivalent, 7 Shots of liquor per week     Comment: "1 mixed drink/day"    ALLERGIES:  has No Known Allergies.  MEDICATIONS:  Current Outpatient Prescriptions  Medication Sig Dispense Refill  . aspirin 325 MG tablet Take 162 mg by mouth daily.    . Chromium Picolinate (RA CHROMIUM PICOLINATE) 400 MCG TABS Take 1 tablet by mouth daily.      . cyanocobalamin (,VITAMIN B-12,) 1000 MCG/ML injection Inject 1057mcg every 60days IM (every other month) 1 mL 0  . dorzolamide-timolol (COSOPT) 22.3-6.8 MG/ML ophthalmic solution Place 1 drop into both eyes 2 (two) times daily.     . fish oil-omega-3 fatty acids 1000 MG capsule Take 2 g by mouth 3 (three) times daily. Take six daily    . Magnesium-Calcium-Folic Acid (MAGNEBIND A999333) 400-200-1 MG TABS Take 1 tablet by mouth daily.      . Melatonin 5 MG CAPS Take 1 each by mouth at bedtime.     . Multiple Vitamins-Minerals (EYE-VITES) TABS Take 1 tablet by mouth 2 (two) times daily.      . Saxagliptin-Metformin (KOMBIGLYZE XR) 12-998 MG TB24 Take 1 each by mouth daily. 90 tablet 1  . sitaGLIPtin (JANUVIA) 100 MG tablet Take 1 tablet (100 mg total) by mouth daily. 90 tablet  3  . tamsulosin (FLOMAX) 0.4 MG CAPS capsule Take 0.4 mg by mouth daily.  3  . telmisartan (MICARDIS) 40 MG tablet TAKE 1 TABLET DAILY (Patient taking differently: Take 40 mg by mouth daily. TAKE 1 TABLET DAILY) 90 tablet 3   No current facility-administered medications for this visit.     PHYSICAL EXAMINATION: ECOG PERFORMANCE STATUS: 0 - Asymptomatic  BP 120/62 (BP Location: Left Arm, Patient Position: Sitting)   Pulse (!) 55   Temp 97.8 F (36.6 C) (Tympanic)   Resp 18   Wt 184 lb (83.5 kg)   BMI 27.98 kg/m   Filed Weights   07/16/16 1036  Weight: 184 lb (83.5 kg)    GENERAL: Elderly Caucasian gentleman. Well-nourished well-developed; Alert, no distress and comfortable.    He walks  with a walker. He is Alone. EYES: no pallor or icterus OROPHARYNX: no thrush or ulceration; good dentition  NECK: supple, no masses felt LYMPH:  no palpable lymphadenopathy in the cervical, axillary or inguinal regions LUNGS: clear to auscultation and  No wheeze or crackles HEART/CVS: regular rate & rhythm and no murmurs; 1+ bilateral extremity edema ABDOMEN:abdomen soft, non-tender and normal bowel sounds Musculoskeletal:no cyanosis of digits and no clubbing  PSYCH: alert & oriented x 3 with fluent speech NEURO: no focal motor/sensory deficits SKIN:  no rashes or significant lesions  LABORATORY DATA:  I have reviewed the data as listed    Component Value Date/Time   NA 133 (L) 07/16/2016 0953   NA 134 (L) 12/01/2011 1159   K 4.2 07/16/2016 0953   K 4.7 12/01/2011 1159   CL 99 (L) 07/16/2016 0953   CL 99 12/01/2011 1159   CO2 30 07/16/2016 0953   CO2 29 12/01/2011 1159   GLUCOSE 180 (H) 07/16/2016 0953   GLUCOSE 167 (H) 12/01/2011 1159   BUN 19 07/16/2016 0953   BUN 12 12/01/2011 1159   CREATININE 0.78 07/16/2016 0953   CREATININE 0.70 12/01/2011 1159   CALCIUM 8.8 (L) 07/16/2016 0953   CALCIUM 8.5 12/01/2011 1159   PROT 7.7 07/16/2016 0953   PROT 7.0 12/01/2011 1159   ALBUMIN 3.6 07/16/2016 0953   ALBUMIN 3.0 (L) 12/01/2011 1159   AST 13 (L) 07/16/2016 0953   AST 16 12/01/2011 1159   ALT 11 (L) 07/16/2016 0953   ALT 19 12/01/2011 1159   ALKPHOS 51 07/16/2016 0953   ALKPHOS 64 12/01/2011 1159   BILITOT 0.8 07/16/2016 0953   BILITOT 0.8 12/01/2011 1159   GFRNONAA >60 07/16/2016 0953   GFRNONAA >60 12/01/2011 1159   GFRAA >60 07/16/2016 0953   GFRAA >60 12/01/2011 1159    No results found for: SPEP, UPEP  Lab Results  Component Value Date   WBC 5.7 07/16/2016   NEUTROABS 2.4 07/16/2016   HGB 12.0 (L) 07/16/2016   HCT 36.1 (L) 07/16/2016   MCV 90.4 07/16/2016   PLT 184 07/16/2016      Chemistry      Component Value Date/Time   NA 133 (L) 07/16/2016 0953    NA 134 (L) 12/01/2011 1159   K 4.2 07/16/2016 0953   K 4.7 12/01/2011 1159   CL 99 (L) 07/16/2016 0953   CL 99 12/01/2011 1159   CO2 30 07/16/2016 0953   CO2 29 12/01/2011 1159   BUN 19 07/16/2016 0953   BUN 12 12/01/2011 1159   CREATININE 0.78 07/16/2016 0953   CREATININE 0.70 12/01/2011 1159      Component Value Date/Time   CALCIUM 8.8 (  L) 07/16/2016 0953   CALCIUM 8.5 12/01/2011 1159   ALKPHOS 51 07/16/2016 0953   ALKPHOS 64 12/01/2011 1159   AST 13 (L) 07/16/2016 0953   AST 16 12/01/2011 1159   ALT 11 (L) 07/16/2016 0953   ALT 19 12/01/2011 1159   BILITOT 0.8 07/16/2016 0953   BILITOT 0.8 12/01/2011 1159       ASSESSMENT & PLAN:   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 Patient continues to be on surveillance.  # MGUS- most recent Nov  2016 M spike 0.8 g/dL. Awaiting labs from today.   # follow up in 12 months/labs- myeloma panel.       Cammie Sickle, MD 07/16/2016 5:05 PM

## 2016-07-16 NOTE — Assessment & Plan Note (Addendum)
#   CMML- blood counts fairly within normal limits except for mildly low hemoglobin of 12;  Normal white count and normal platelets. Patient continues to be on surveillance.  # MGUS- most recent Nov  2016 M spike 0.8 g/dL. Awaiting labs from today.   # follow up in 12 months/labs- myeloma panel.

## 2016-07-16 NOTE — Progress Notes (Signed)
Patient is here for follow up, he is doing well no complaints  

## 2016-07-20 ENCOUNTER — Other Ambulatory Visit: Payer: Self-pay

## 2016-07-20 LAB — MULTIPLE MYELOMA PANEL, SERUM
ALBUMIN SERPL ELPH-MCNC: 3.7 g/dL (ref 2.9–4.4)
ALPHA2 GLOB SERPL ELPH-MCNC: 0.5 g/dL (ref 0.4–1.0)
Albumin/Glob SerPl: 1.1 (ref 0.7–1.7)
Alpha 1: 0.2 g/dL (ref 0.0–0.4)
B-Globulin SerPl Elph-Mcnc: 0.9 g/dL (ref 0.7–1.3)
Gamma Glob SerPl Elph-Mcnc: 2.1 g/dL — ABNORMAL HIGH (ref 0.4–1.8)
Globulin, Total: 3.7 g/dL (ref 2.2–3.9)
IGG (IMMUNOGLOBIN G), SERUM: 2058 mg/dL — AB (ref 700–1600)
IGM, SERUM: 123 mg/dL (ref 15–143)
IgA: 338 mg/dL (ref 61–437)
M PROTEIN SERPL ELPH-MCNC: 1 g/dL — AB
TOTAL PROTEIN ELP: 7.4 g/dL (ref 6.0–8.5)

## 2016-07-20 MED ORDER — GLUCOSE BLOOD VI STRP: ORAL_STRIP | 1 refills | Status: AC

## 2016-07-20 NOTE — Telephone Encounter (Signed)
Pt request refill one touch ultra test strips to [facility] mail order; refilled per protocol and pt voiced understanding.

## 2016-07-26 DIAGNOSIS — G4733 Obstructive sleep apnea (adult) (pediatric): Secondary | ICD-10-CM | POA: Diagnosis not present

## 2016-08-16 DIAGNOSIS — B351 Tinea unguium: Secondary | ICD-10-CM | POA: Diagnosis not present

## 2016-08-16 DIAGNOSIS — E1142 Type 2 diabetes mellitus with diabetic polyneuropathy: Secondary | ICD-10-CM | POA: Diagnosis not present

## 2016-09-07 ENCOUNTER — Ambulatory Visit: Payer: Medicare Other

## 2016-09-07 ENCOUNTER — Ambulatory Visit (INDEPENDENT_AMBULATORY_CARE_PROVIDER_SITE_OTHER): Payer: Medicare Other

## 2016-09-07 VITALS — BP 110/60 | HR 58 | Temp 97.9°F | Ht 67.0 in | Wt 179.8 lb

## 2016-09-07 DIAGNOSIS — Z0000 Encounter for general adult medical examination without abnormal findings: Secondary | ICD-10-CM

## 2016-09-07 DIAGNOSIS — Z23 Encounter for immunization: Secondary | ICD-10-CM | POA: Diagnosis not present

## 2016-09-07 DIAGNOSIS — E538 Deficiency of other specified B group vitamins: Secondary | ICD-10-CM | POA: Diagnosis not present

## 2016-09-07 MED ORDER — CYANOCOBALAMIN 1000 MCG/ML IJ SOLN
1000.0000 ug | Freq: Once | INTRAMUSCULAR | Status: AC
  Administered 2016-09-07: 1000 ug via INTRAMUSCULAR

## 2016-09-07 NOTE — Progress Notes (Signed)
Subjective:   Nathan Cunningham is a 83 y.o. male who presents for Medicare Annual/Subsequent preventive examination.  Review of Systems:  N/A Cardiac Risk Factors include: advanced age (>78men, >4 women);male gender;diabetes mellitus;hypertension     Objective:    Vitals: BP 110/60 (BP Location: Right Arm, Patient Position: Sitting, Cuff Size: Normal)   Pulse (!) 58   Temp 97.9 F (36.6 C) (Oral)   Ht 5\' 7"  (1.702 m) Comment: shoes  Wt 179 lb 12 oz (81.5 kg)   SpO2 92%   BMI 28.15 kg/m   Body mass index is 28.15 kg/m.  Tobacco History  Smoking Status  . Never Smoker  Smokeless Tobacco  . Never Used     Counseling given: No   Past Medical History:  Diagnosis Date  . Adenomatous polyp of colon 03/2003  . Anemia   . Constipation   . Diverticulosis   . Dizziness   . ETOH abuse    binge drinking  . Hemorrhoids   . Hyperlipidemia   . Hypertension   . Macular degeneration    Right eye  . OSA on CPAP   . Type II diabetes mellitus ([place])    Past Surgical History:  Procedure Laterality Date  . ANTERIOR CERVICAL DECOMP/DISCECTOMY FUSION    . APPENDECTOMY  1935  . BACK SURGERY    . CATARACT EXTRACTION Left   . INGUINAL HERNIA REPAIR  1935  . TONSILLECTOMY     Family History  Problem Relation Age of Onset  . Heart disease Father   . Stroke Father 76   History  Sexual Activity  . Sexual activity: No    Outpatient Encounter Prescriptions as of 09/07/2016  Medication Sig  . aspirin 325 MG tablet Take 162 mg by mouth daily.  . Chromium Picolinate (RA CHROMIUM PICOLINATE) 400 MCG TABS Take 1 tablet by mouth daily.    . cyanocobalamin (,VITAMIN B-12,) 1000 MCG/ML injection Inject 1095mcg every 60days IM (every other month)  . dorzolamide-timolol (COSOPT) 22.3-6.8 MG/ML ophthalmic solution Place 1 drop into both eyes 2 (two) times daily.   . fish oil-omega-3 fatty acids 1000 MG capsule Take 2 g by mouth 3 (three) times daily. Take six daily  . glucose blood  (ONE TOUCH ULTRA TEST) test strip CHECK BLOOD SUGAR ONCE DAILY AND AS DIRECTED.  . Magnesium-Calcium-Folic Acid (MAGNEBIND A999333) 400-200-1 MG TABS Take 1 tablet by mouth daily.    . Melatonin 5 MG CAPS Take 1 each by mouth at bedtime.   . Multiple Vitamins-Minerals (EYE-VITES) TABS Take 1 tablet by mouth 2 (two) times daily.    . Saxagliptin-Metformin (KOMBIGLYZE XR) 12-998 MG TB24 Take 1 each by mouth daily.  . sitaGLIPtin (JANUVIA) 100 MG tablet Take 1 tablet (100 mg total) by mouth daily.  . tamsulosin (FLOMAX) 0.4 MG CAPS capsule Take 0.4 mg by mouth daily.  Marland Kitchen telmisartan (MICARDIS) 40 MG tablet TAKE 1 TABLET DAILY (Patient taking differently: Take 40 mg by mouth daily. TAKE 1 TABLET DAILY)  . [EXPIRED] cyanocobalamin ((VITAMIN B-12)) injection 1,000 mcg    No facility-administered encounter medications on file as of 09/07/2016.     Activities of Daily Living In your present state of health, do you have any difficulty performing the following activities: 09/07/2016 03/03/2016  Hearing? N N  Vision? Y Y  Difficulty concentrating or making decisions? Y N  Walking or climbing stairs? Y Y  Dressing or bathing? N N  Doing errands, shopping? Y N  Preparing Food and eating ?  Y -  Using the Toilet? N -  In the past six months, have you accidently leaked urine? Y -  Do you have problems with loss of bowel control? Y -  Managing your Medications? N -  Managing your Finances? N -  Housekeeping or managing your Housekeeping? Y -  Some recent data might be hidden    Patient Care Team: Lucille Passy, MD as PCP - General (Family Medicine) Kathee Delton, MD as Consulting Physician (Pulmonary Disease) Deboraha Sprang, MD as Consulting Physician (Cardiology) Greer Pickerel, MD as Consulting Physician (General Surgery) Rondel Oh, MD as Referring Physician (Ophthalmology) Barrie Dunker, MD (Dermatology)   Assessment:     Hearing Screening   125Hz  250Hz  500Hz  1000Hz  2000Hz  3000Hz   4000Hz  6000Hz  8000Hz   Right ear:   0 0 40  0    Left ear:   0 0 40  0    Vision Screening Comments: Last vision exam with [facility] in May 2017   Exercise Activities and Dietary recommendations Current Exercise Habits: Home exercise routine, Type of exercise: Other - see comments;strength training/weights (stationary bike), Time (Minutes): 10, Frequency (Times/Week): 7, Weekly Exercise (Minutes/Week): 70, Intensity: Mild, Exercise limited by: orthopedic condition(s)  Goals    . Increase physical activity          Starting 09/07/2016, I will continue to exercise at least 10-15 min daily.       Fall Risk Fall Risk  09/07/2016 04/19/2014 12/07/2013 03/19/2013 11/13/2012  Falls in the past year? No Yes Yes No No  Number falls in past yr: - 2 or more - - -  Risk for fall due to : History of fall(s);Impaired balance/gait;Impaired mobility;Impaired vision - Impaired balance/gait Impaired balance/gait;Impaired mobility Impaired mobility;Impaired balance/gait   Depression Screen PHQ 2/9 Scores 09/07/2016 04/19/2014 12/07/2013 03/19/2013  PHQ - 2 Score 0 0 0 1    Cognitive Function MMSE - Mini Mental State Exam 09/07/2016  Orientation to time 5  Orientation to Place 5  Registration 3  Attention/ Calculation 0  Recall 2  Recall-comments pt was unable to recall 1 of 3 words  Language- name 2 objects 0  Language- repeat 1  Language- follow 3 step command 3  Language- read & follow direction 0  Write a sentence 0  Copy design 0  Total score 19     PLEASE NOTE: A Mini-Cog screen was completed. Maximum score is 20. A value of 0 denotes this part of [place] MMSE was not completed or the patient failed this part of the Mini-Cog screening.   Mini-Cog Screening Orientation to Time - Max 5 pts Orientation to Place - Max 5 pts Registration - Max 3 pts Recall - Max 3 pts Language Repeat - Max 1 pts Language Follow 3 Step Command - Max 3 pts     Immunization History  Administered  Date(s) Administered  . H1N1 07/23/2008  . Influenza Split 05/05/2011, 05/15/2012, 05/07/2015  . Influenza Whole 08/09/2005, 05/26/2007, 05/23/2008, 05/26/2009, 04/08/2010  . Influenza,inj,Quad PF,36+ Mos 05/16/2013, 08/21/2013, 04/19/2014, 04/06/2016  . Pneumococcal Conjugate-13 04/19/2014  . Pneumococcal Polysaccharide-23 08/09/1998  . Td 08/09/2006, 09/07/2016  . Zoster 08/22/2014   Screening Tests Health Maintenance  Topic Date Due  . OPHTHALMOLOGY EXAM  12/07/2016  . HEMOGLOBIN A1C  01/03/2017  . FOOT EXAM  07/06/2017  . TETANUS/TDAP  09/07/2026  . INFLUENZA VACCINE  Completed  . ZOSTAVAX  Completed  . PNA vac Low Risk Adult  Completed  Plan:     I have personally reviewed and addressed the Medicare Annual Wellness questionnaire and have noted the following in the patient's chart:  A. Medical and social history B. Use of alcohol, tobacco or illicit drugs  C. Current medications and supplements D. Functional ability and status E.  Nutritional status F.  Physical activity G. Advance directives H. List of other physicians I.  Hospitalizations, surgeries, and ER visits in previous 12 months J.  [place] to include hearing, vision, cognitive, depression L. Referrals and appointments - none  In addition, I have reviewed and discussed with patient certain preventive protocols, quality metrics, and best practice recommendations. A written personalized care plan for preventive services as well as general preventive health recommendations were provided to patient.  See attached scanned questionnaire for additional information.   Signed,   Lindell Noe, MHA, BS, LPN Health Coach

## 2016-09-07 NOTE — Progress Notes (Signed)
Pre visit review using our clinic review tool, if applicable. No additional management support is needed unless otherwise documented below in the visit note. 

## 2016-09-07 NOTE — Progress Notes (Signed)
PCP notes:   Health maintenance:  Tetanus - administered Eye exam - per pt report, exam in May 2017  Abnormal screenings:   Hearing - failed Mini-Cog score: 19/20  Patient concerns:   None  Nurse concerns:  Pt given B12 injection in left deltoid per order. Tolerated procedure well.   Next PCP appt:   10/06/16 @ 1130  I reviewed health advisor's note, was available for consultation, and agree with documentation and plan. Loura Pardon MD

## 2016-09-07 NOTE — Patient Instructions (Signed)
Nathan Cunningham , Thank you for taking time to come for your Medicare Wellness Visit. I appreciate your ongoing commitment to your health goals. Please review the following plan we discussed and let me know if I can assist you in the future.   These are the goals we discussed: Goals    . Increase physical activity          Starting 09/07/2016, I will continue to exercise at least 10-15 min daily.        This is a list of the screening recommended for you and due dates:  Health Maintenance  Topic Date Due  . Eye exam for diabetics  12/07/2016  . Hemoglobin A1C  01/03/2017  . Complete foot exam   07/06/2017  . Tetanus Vaccine  09/07/2026  . Flu Shot  Completed  . Shingles Vaccine  Completed  . Pneumonia vaccines  Completed   Preventive Care for Adults  A healthy lifestyle and preventive care can promote health and wellness. Preventive health guidelines for adults include the following key practices.  . A routine yearly physical is a good way to check with your health care provider about your health and preventive screening. It is a chance to share any concerns and updates on your health and to receive a thorough exam.  . Visit your dentist for a routine exam and preventive care every 6 months. Brush your teeth twice a day and floss once a day. Good oral hygiene prevents tooth decay and gum disease.  . The frequency of eye exams is based on your age, health, family medical history, use  of contact lenses, and other factors. Follow your health care provider's ecommendations for frequency of eye exams.  . Eat a healthy diet. Foods like vegetables, fruits, whole grains, low-fat dairy products, and lean protein foods contain the nutrients you need without too many calories. Decrease your intake of foods high in solid fats, added sugars, and salt. Eat the right amount of calories for you. Get information about a proper diet from your health care provider, if necessary.  . Regular physical  exercise is one of the most important things you can do for your health. Most adults should get at least 150 minutes of moderate-intensity exercise (any activity that increases your heart rate and causes you to sweat) each week. In addition, most adults need muscle-strengthening exercises on 2 or more days a week.  Silver Sneakers may be a benefit available to you. To determine eligibility, you may visit the website: www.silversneakers.com or contact program at (830) 230-3025 Mon-Fri between 8AM-8PM.   . Maintain a healthy weight. The body mass index (BMI) is a screening tool to identify possible weight problems. It provides an estimate of body fat based on height and weight. Your health care provider can find your BMI and can help you achieve or maintain a healthy weight.   For adults 20 years and older: ? A BMI below 18.5 is considered underweight. ? A BMI of 18.5 to 24.9 is normal. ? A BMI of 25 to 29.9 is considered overweight. ? A BMI of 30 and above is considered obese.   . Maintain normal blood lipids and cholesterol levels by exercising and minimizing your intake of saturated fat. Eat a balanced diet with plenty of fruit and vegetables. Blood tests for lipids and cholesterol should begin at age 16 and be repeated every 5 years. If your lipid or cholesterol levels are high, you are over 50, or you are at high risk  for heart disease, you may need your cholesterol levels checked more frequently. Ongoing high lipid and cholesterol levels should be treated with medicines if diet and exercise are not working.  . If you smoke, find out from your health care provider how to quit. If you do not use tobacco, please do not start.  . If you choose to drink alcohol, please do not consume more than 2 drinks per day. One drink is considered to be 12 ounces (355 mL) of beer, 5 ounces (148 mL) of wine, or 1.5 ounces (44 mL) of liquor.  . If you are 27-42 years old, ask your health care provider if you  should take aspirin to prevent strokes.  . Use sunscreen. Apply sunscreen liberally and repeatedly throughout the day. You should seek shade when your shadow is shorter than you. Protect yourself by wearing long sleeves, pants, a wide-brimmed hat, and sunglasses year round, whenever you are outdoors.  . Once a month, do a whole body skin exam, using a mirror to look at the skin on your back. Tell your health care provider of new moles, moles that have irregular borders, moles that are larger than a pencil eraser, or moles that have changed in shape or color.

## 2016-09-29 ENCOUNTER — Telehealth: Payer: Self-pay

## 2016-09-29 MED ORDER — PREGABALIN 75 MG PO CAPS: 75.0000 mg | ORAL_CAPSULE | Freq: Every day | ORAL | 0 refills | Status: DC

## 2016-09-29 NOTE — Telephone Encounter (Signed)
Noted.  Ok to phone in rx as entered below.

## 2016-09-29 NOTE — Telephone Encounter (Signed)
Lyrica 75mg  1 tab po was called in to [facility]. Pt aware.

## 2016-09-29 NOTE — Telephone Encounter (Signed)
Pt called and requesting a new rx lyrica for peripheral neuropathy pain recommended by podiatrist.Previously podiatrist gave lyrica sample and it helped the neuropathy. Podiatrist told pt he could not write long term rx and to ck with PCP. Pt request 90 day rx  lyrica to [facility] mail [facility] Pt last seen part 1 of annual exam on 09/07/16 with Lattie Haw and pt has f/u appt on 10/06/16 with Dr [name].

## 2016-09-29 NOTE — Telephone Encounter (Signed)
75 mg po PRN  He's requesting a 90 day supply.

## 2016-09-29 NOTE — Telephone Encounter (Signed)
Please call pt to find out which dose of lyrica he is requesting.

## 2016-10-06 ENCOUNTER — Encounter: Payer: Self-pay | Admitting: Family Medicine

## 2016-10-06 ENCOUNTER — Ambulatory Visit (INDEPENDENT_AMBULATORY_CARE_PROVIDER_SITE_OTHER): Payer: Medicare Other | Admitting: Family Medicine

## 2016-10-06 VITALS — BP 120/60 | HR 63 | Ht 67.0 in | Wt 180.0 lb

## 2016-10-06 DIAGNOSIS — N401 Enlarged prostate with lower urinary tract symptoms: Secondary | ICD-10-CM | POA: Diagnosis not present

## 2016-10-06 DIAGNOSIS — E118 Type 2 diabetes mellitus with unspecified complications: Secondary | ICD-10-CM | POA: Diagnosis not present

## 2016-10-06 DIAGNOSIS — R351 Nocturia: Secondary | ICD-10-CM | POA: Diagnosis not present

## 2016-10-06 DIAGNOSIS — E538 Deficiency of other specified B group vitamins: Secondary | ICD-10-CM | POA: Diagnosis not present

## 2016-10-06 DIAGNOSIS — I10 Essential (primary) hypertension: Secondary | ICD-10-CM | POA: Diagnosis not present

## 2016-10-06 LAB — COMPREHENSIVE METABOLIC PANEL
ALK PHOS: 53 U/L (ref 39–117)
ALT: 10 U/L (ref 0–53)
AST: 11 U/L (ref 0–37)
Albumin: 3.7 g/dL (ref 3.5–5.2)
BUN: 17 mg/dL (ref 6–23)
CO2: 30 meq/L (ref 19–32)
Calcium: 9.2 mg/dL (ref 8.4–10.5)
Chloride: 100 mEq/L (ref 96–112)
Creatinine, Ser: 0.73 mg/dL (ref 0.40–1.50)
GFR: 107.45 mL/min (ref >60.00)
GLUCOSE: 139 mg/dL — AB (ref 70–99)
POTASSIUM: 4.9 meq/L (ref 3.5–5.1)
Sodium: 133 mEq/L — ABNORMAL LOW (ref 135–145)
Total Bilirubin: 0.8 mg/dL (ref 0.2–1.2)
Total Protein: 7.7 g/dL (ref 6.0–8.3)

## 2016-10-06 LAB — VITAMIN B12: Vitamin B-12: 1364 pg/mL — ABNORMAL HIGH (ref 211–911)

## 2016-10-06 LAB — HEMOGLOBIN A1C: HEMOGLOBIN A1C: 8 % — AB (ref 4.6–6.5)

## 2016-10-06 NOTE — Assessment & Plan Note (Signed)
Explained to pt that I'm not comfortable increasing or changing his BPH meds given risk of fall but can refer him to urology to discuss options. He agreed- referral placed.

## 2016-10-06 NOTE — Assessment & Plan Note (Signed)
Well controlled. No changes made to rxs. 

## 2016-10-06 NOTE — Patient Instructions (Signed)
Great to see you. I will call you with your lab results and we will call you with your referral to urology.

## 2016-10-06 NOTE — Progress Notes (Signed)
Subjective:   Patient ID: Nathan Cunningham, male    DOB: [birth year], 83 y.o.   MRN: [id number]  Nathan Cunningham is a pleasant 83 y.o. year old male who presents to clinic today with Follow-up  on 10/06/2016  HPI:   Vit B12 def- has been receiving IM b12. Lab Results  Component Value Date   [id number] 07/06/2016    HTN- has been well controlled on lower dose of Micardis ( decreased in 04/2016 due to sx of orthostasis). Denies dizziness, CP, HA or SOB.  Lab Results  Component Value Date   CREATININE 0.78 07/16/2016   BP Readings from Last 3 Encounters:  10/06/16 120/60  09/07/16 110/60  07/16/16 120/62   We also d/c'd his elavil due to constipation and constipation has improved with this and adding miralax.  He was recently started on Lyrica by Podiatry and this has been helping with his neuropathy.  DM- taking glipizide and Kombiglyze, and Januvia. No noticeable side effects.  Checking FSBS three times weekly- 144- 283 not fasting.  Denies any symptoms of hypoglycemia.    He is having more LUTS symptoms despite taking flomax, sometimes waking up 6 or more times per night to urinate.  This has been ongoing for months to years according to his girlfriend who is with him today.  Lab Results  Component Value Date   HGBA1C 7.2 (H) 07/06/2016   Lab Results  Component Value Date   CHOL 134 07/06/2016   HDL 35.40 (L) 07/06/2016   LDLCALC 71 07/06/2016   LDLDIRECT 81.0 02/04/2015   TRIG 138.0 07/06/2016   CHOLHDL 4 07/06/2016   Lab Results  Component Value Date   NA 133 (L) 07/16/2016   K 4.2 07/16/2016   CL 99 (L) 07/16/2016   CO2 30 07/16/2016   Current Outpatient Prescriptions on File Prior to Visit  Medication Sig Dispense Refill  . aspirin 325 MG tablet Take 162 mg by mouth daily.    . Chromium Picolinate (RA CHROMIUM PICOLINATE) 400 MCG TABS Take 1 tablet by mouth daily.      . cyanocobalamin (,VITAMIN B-12,) 1000 MCG/ML injection Inject 1075mcg every  60days IM (every other month) 1 mL 0  . dorzolamide-timolol (COSOPT) 22.3-6.8 MG/ML ophthalmic solution Place 1 drop into both eyes 2 (two) times daily.     . fish oil-omega-3 fatty acids 1000 MG capsule Take 2 g by mouth 3 (three) times daily. Take six daily    . glucose blood (ONE TOUCH ULTRA TEST) test strip CHECK BLOOD SUGAR ONCE DAILY AND AS DIRECTED. 100 each 1  . Magnesium-Calcium-Folic Acid (MAGNEBIND [id number]) 400-200-1 MG TABS Take 1 tablet by mouth daily.      . Melatonin 5 MG CAPS Take 1 each by mouth at bedtime.     . Multiple Vitamins-Minerals (EYE-VITES) TABS Take 1 tablet by mouth 2 (two) times daily.      . pregabalin (LYRICA) 75 MG capsule Take 1 capsule (75 mg total) by mouth daily. 90 capsule 0  . Saxagliptin-Metformin (KOMBIGLYZE XR) 12-998 MG TB24 Take 1 each by mouth daily. 90 tablet 1  . sitaGLIPtin (JANUVIA) 100 MG tablet Take 1 tablet (100 mg total) by mouth daily. 90 tablet 3  . tamsulosin (FLOMAX) 0.4 MG CAPS capsule Take 0.4 mg by mouth daily.  3  . telmisartan (MICARDIS) 40 MG tablet TAKE 1 TABLET DAILY (Patient taking differently: Take 40 mg by mouth daily. TAKE 1 TABLET DAILY) 90 tablet 3   No current  facility-administered medications on file prior to visit.     No Known Allergies  Past Medical History:  Diagnosis Date  . Adenomatous polyp of colon 03/2003  . Anemia   . Constipation   . Diverticulosis   . Dizziness   . ETOH abuse    binge drinking  . Hemorrhoids   . Hyperlipidemia   . Hypertension   . Macular degeneration    Right eye  . OSA on CPAP   . Type II diabetes mellitus ([place])     Past Surgical History:  Procedure Laterality Date  . ANTERIOR CERVICAL DECOMP/DISCECTOMY FUSION    . APPENDECTOMY  1935  . BACK SURGERY    . CATARACT EXTRACTION Left   . INGUINAL HERNIA REPAIR  1935  . TONSILLECTOMY      Family History  Problem Relation Age of Onset  . Heart disease Father   . Stroke Father 7    Social History   Social History  .  Marital status: Divorced    Spouse name: N/A  . Number of children: 2  . Years of education: N/A   Occupational History  . retired    Social History Main Topics  . Smoking status: Never Smoker  . Smokeless tobacco: Never Used  . Alcohol use 12.6 oz/week    14 Standard drinks or equivalent, 7 Shots of liquor per week     Comment: "1 mixed drink/day"  . Drug use: No  . Sexual activity: No   Other Topics Concern  . Not on file   Social History Narrative   Retired from [employer]   Goes to [place] sneakers 3 times a week.   Has a girlfriend.   Does not have a living will yet.   Desires CPR, does not prolonged life support if futile.         The PMH, PSH, Social History, Family History, Medications, and allergies have been reviewed in [facility], and have been updated if relevant.   Review of Systems  Constitutional: Negative for fatigue, fever and unexpected weight change.  HENT: Negative.   Eyes: Negative.   Respiratory: Negative.   Cardiovascular: Negative.   Gastrointestinal: Negative.   Endocrine: Negative.   Genitourinary: Positive for difficulty urinating.  Musculoskeletal: Negative.   Allergic/Immunologic: Negative.   Neurological: Negative.   Hematological: Negative.   Psychiatric/Behavioral: Negative.   All other systems reviewed and are negative.      Objective:    BP 120/60   Pulse 63   Ht 5\' 7"  (1.702 m)   Wt 180 lb (81.6 kg)   SpO2 98%   BMI 28.19 kg/m    Physical Exam  Constitutional: He is oriented to person, place, and time. He appears well-developed and well-nourished. No distress.  HENT:  Head: Normocephalic and atraumatic.  Eyes: Conjunctivae are normal.  Neck: Normal range of motion. Neck supple.  Cardiovascular: Normal rate and regular rhythm.   Pulmonary/Chest: Effort normal and breath sounds normal.  Musculoskeletal: Normal range of motion. He exhibits no edema.  Neurological: He is alert and oriented to person, place, and time. No  cranial nerve deficit.  Skin: Skin is dry. He is not diaphoretic.  Psychiatric: He has a normal mood and affect. His behavior is normal. Judgment and thought content normal.  Nursing note and vitals reviewed.         Assessment & Plan:   Diabetes mellitus with complication [facility])  Essential hypertension No Follow-up on file.

## 2016-10-06 NOTE — Assessment & Plan Note (Signed)
Received last IM B12 last month. Due for labs today.

## 2016-10-06 NOTE — Assessment & Plan Note (Signed)
Continue current rxs.  Check labs today. 

## 2016-10-18 ENCOUNTER — Ambulatory Visit: Payer: Self-pay

## 2016-10-18 ENCOUNTER — Other Ambulatory Visit: Payer: Self-pay

## 2016-10-18 MED ORDER — SITAGLIPTIN PHOSPHATE 100 MG PO TABS
100.0000 mg | ORAL_TABLET | Freq: Every day | ORAL | 3 refills | Status: DC
Start: 2016-10-18 — End: 2017-09-12

## 2016-10-20 DIAGNOSIS — H401122 Primary open-angle glaucoma, left eye, moderate stage: Secondary | ICD-10-CM | POA: Diagnosis not present

## 2016-10-20 DIAGNOSIS — H26492 Other secondary cataract, left eye: Secondary | ICD-10-CM | POA: Diagnosis not present

## 2016-10-20 DIAGNOSIS — H2511 Age-related nuclear cataract, right eye: Secondary | ICD-10-CM | POA: Diagnosis not present

## 2016-10-20 DIAGNOSIS — H401114 Primary open-angle glaucoma, right eye, indeterminate stage: Secondary | ICD-10-CM | POA: Diagnosis not present

## 2016-11-01 ENCOUNTER — Telehealth: Payer: Self-pay

## 2016-11-01 NOTE — Telephone Encounter (Signed)
Pt called; pt has developed arthritic lt hip and upper lt leg. pts mobility has been cut down due to this pain. When pt stretches his legs out when lays down. Pain is so bad that he has been sleeping in recliner. pts lt leg also swollen.pt wants to know If there is a med pt can take or what to do for pain and lack of mobility.

## 2016-11-01 NOTE — Telephone Encounter (Signed)
Has he tried extra strength tylenol, scheduled twice daily?

## 2016-11-01 NOTE — Telephone Encounter (Signed)
Notified pt about Dr. [name] instructions

## 2016-11-02 DIAGNOSIS — G4733 Obstructive sleep apnea (adult) (pediatric): Secondary | ICD-10-CM | POA: Diagnosis not present

## 2016-11-12 ENCOUNTER — Other Ambulatory Visit: Payer: Self-pay

## 2016-11-12 MED ORDER — TAMSULOSIN HCL 0.4 MG PO CAPS: 0.4000 mg | ORAL_CAPSULE | Freq: Every day | ORAL | 0 refills | Status: DC

## 2016-11-12 NOTE — Telephone Encounter (Signed)
Rx sent electronically.  

## 2016-11-22 IMAGING — DX DG CHEST 2V
2 series · 2 of 2 positions shown · non-contrast
Comparison: PA and lateral chest x-ray dated September 17, 2015

CLINICAL DATA: Weakness, balance disturbance, fall yesterday;
history of diabetes, hypertension atrial flutter

EXAM:
CHEST  2 VIEW

[chest pa]
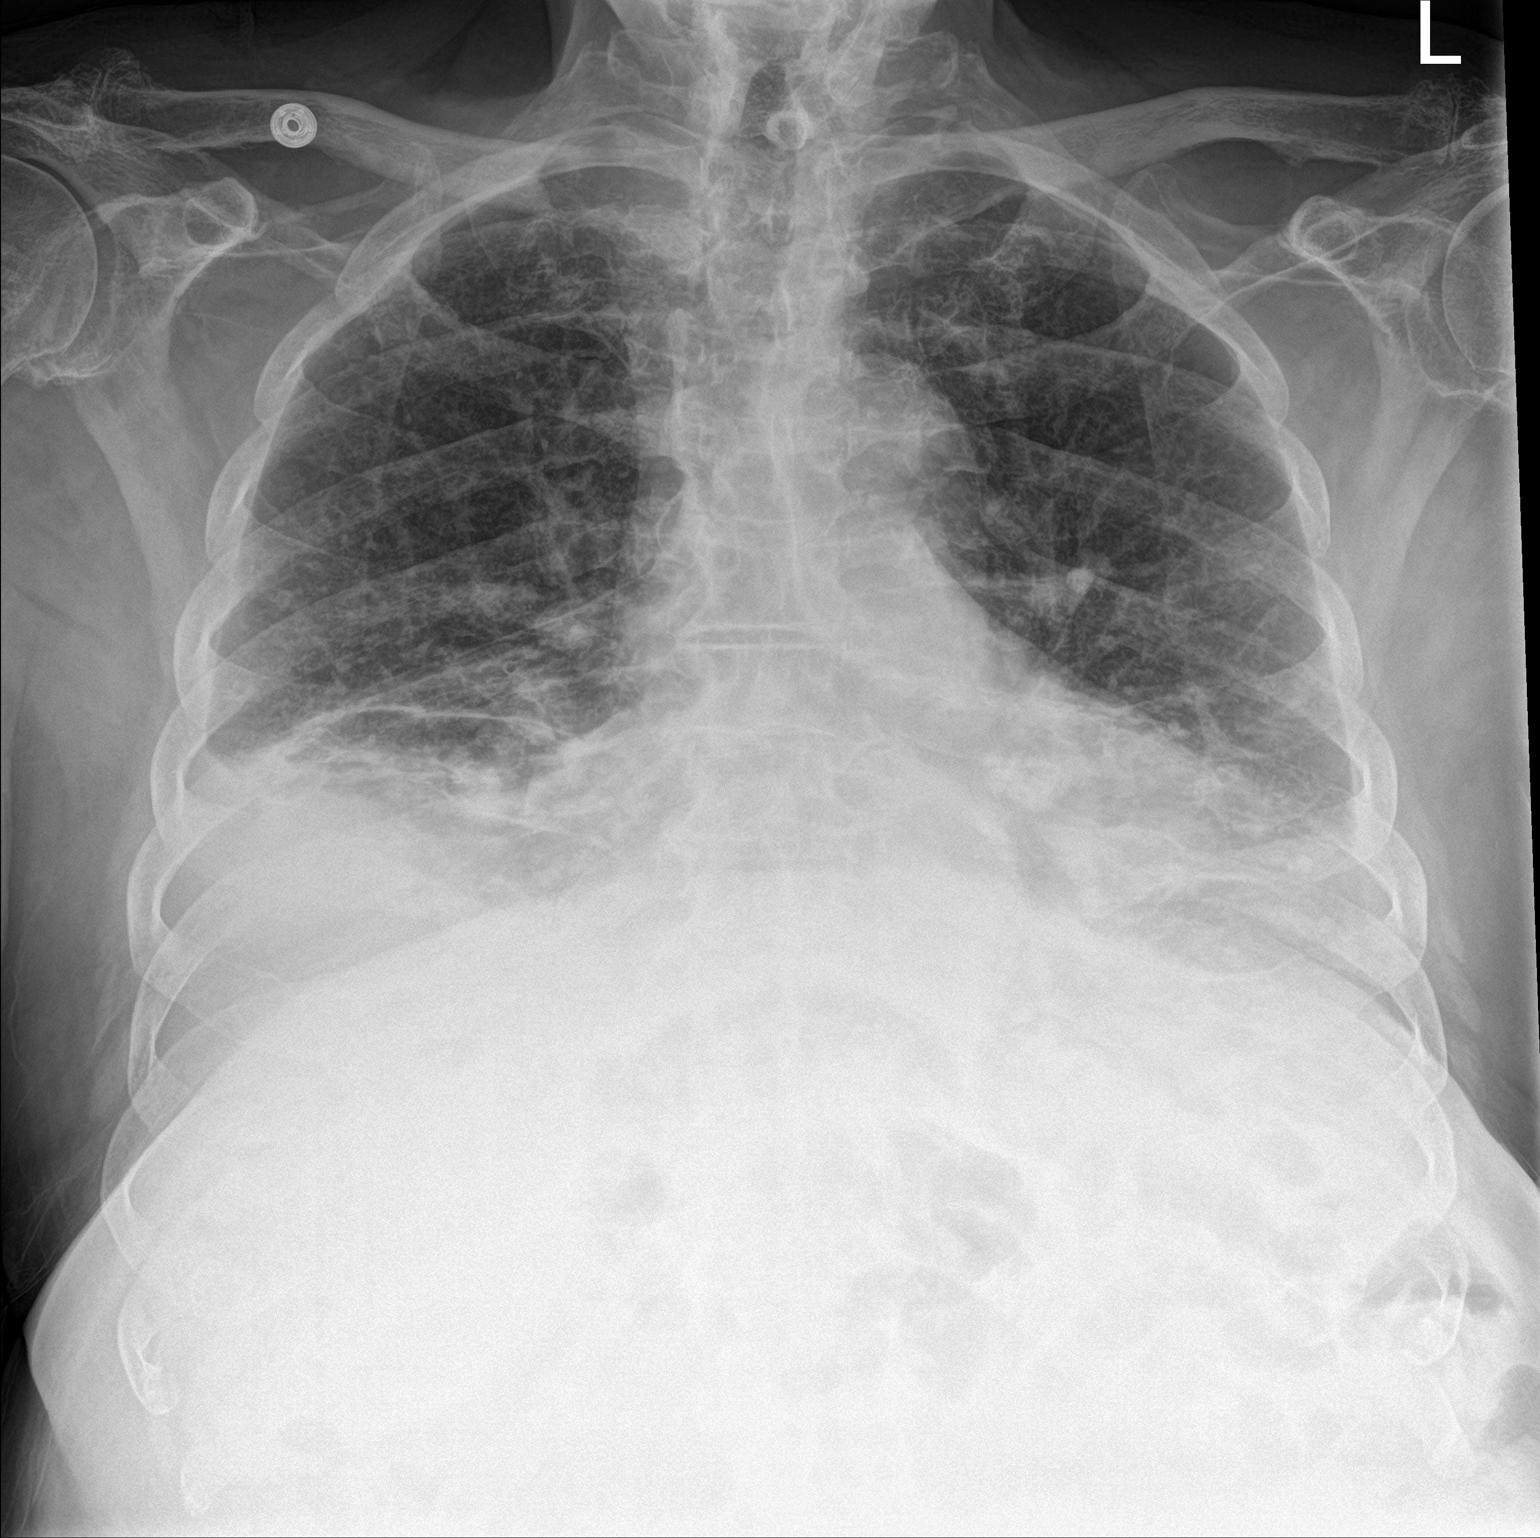

[chest lat]
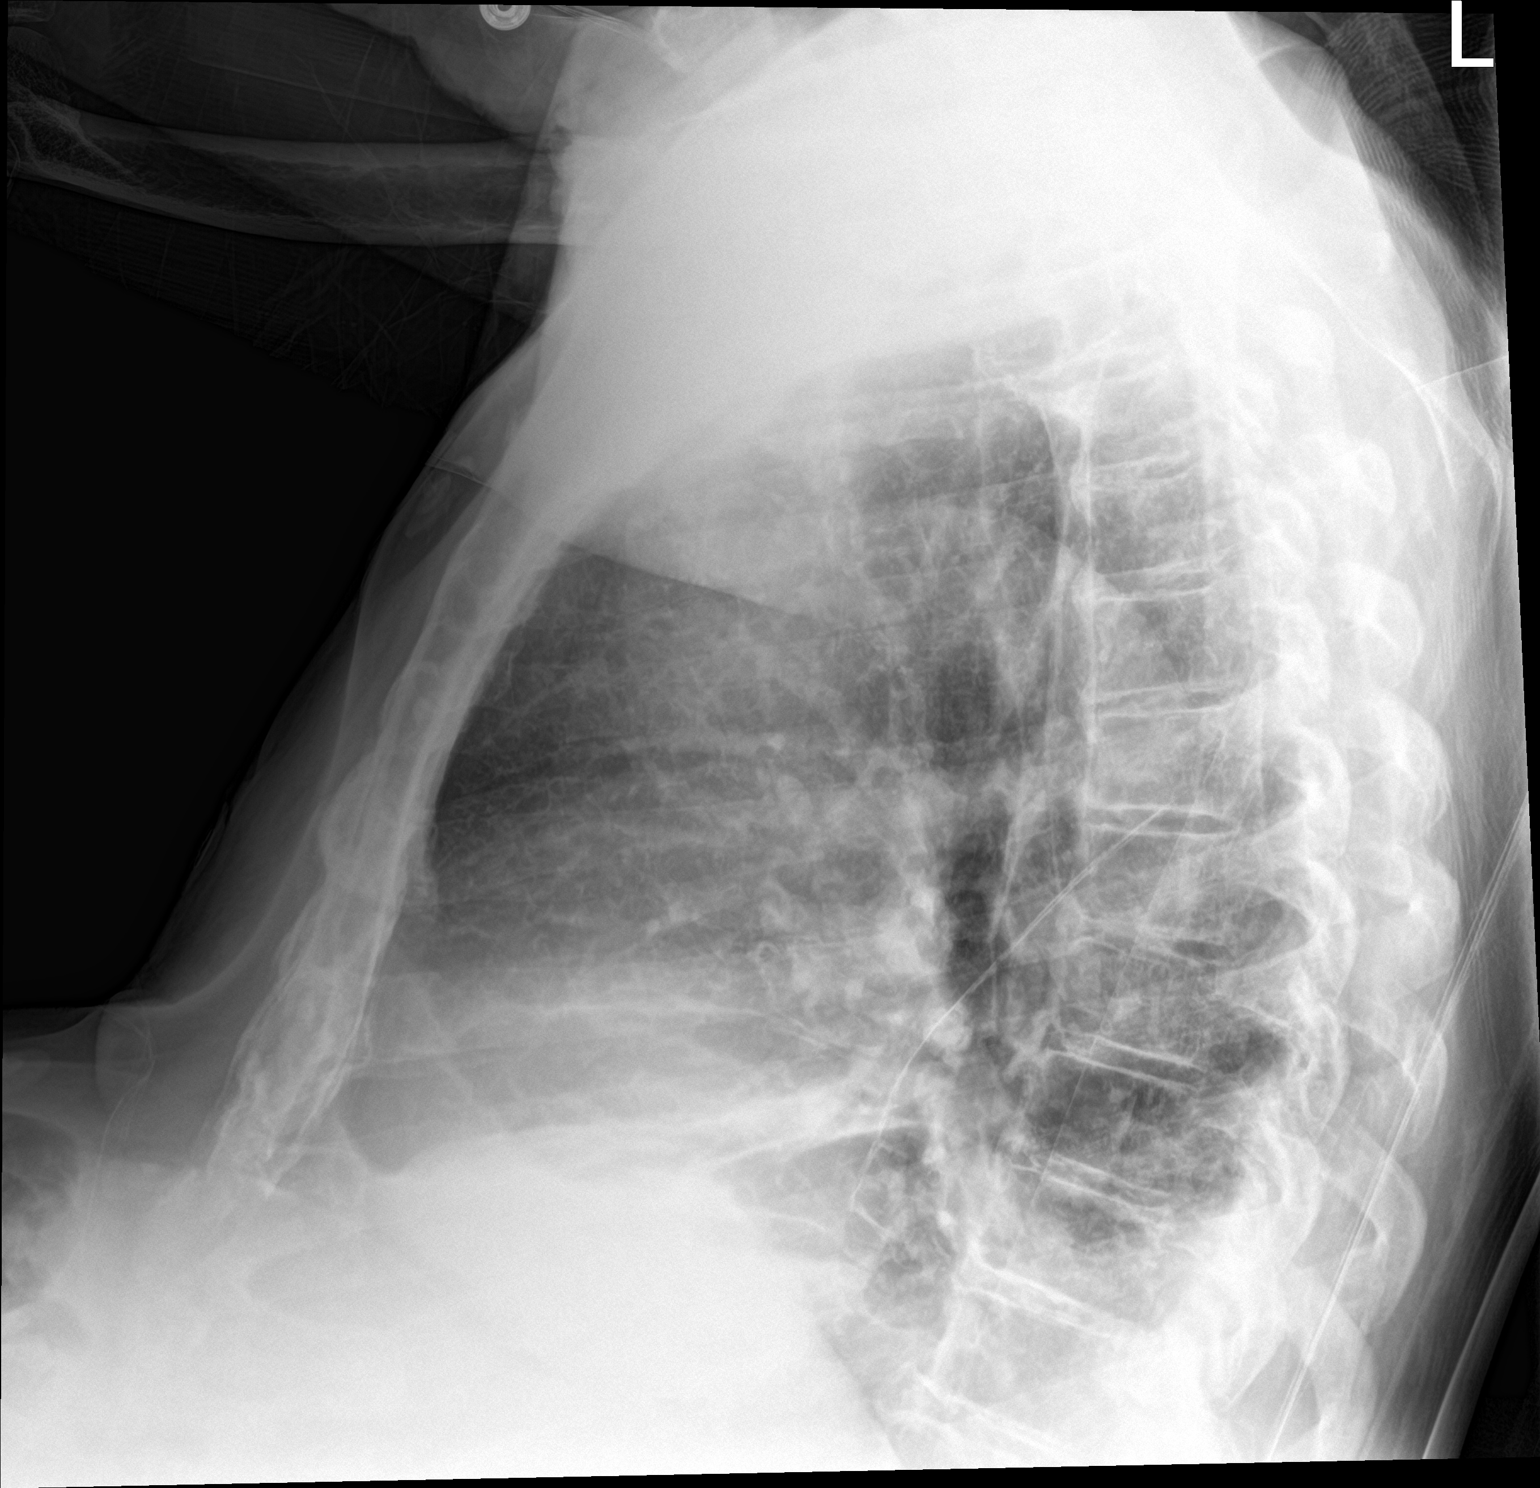

[2 of 2 positions shown; findings below may reference images not displayed]

FINDINGS: The lungs are mildly hypoinflated. There is dense infiltrate at the
right lung base posteriorly. There are small bilateral pleural
effusions. The heart is normal in size. The pulmonary vascularity is
not engorged. The bony thorax exhibits no acute abnormality.
IMPRESSION: Bilateral hypo inflation. Infiltrate in the right lower lobe
worrisome for pneumonia. Small bilateral pleural effusions. Mild
cardiomegaly without significant pulmonary vascular congestion.

Followup PA and lateral chest X-ray is recommended in 3-4 weeks
following trial of antibiotic therapy to ensure resolution and
exclude underlying malignancy.

## 2016-11-22 IMAGING — CT CT CERVICAL SPINE W/O CM
5 of 8 series · 13 of 33 positions shown, 14 images · non-contrast
Comparison: CT scan of head December 22, 2015.

CLINICAL DATA: Forehead injury after fall today. No loss of
consciousness.

EXAM:
CT HEAD WITHOUT CONTRAST
CT CERVICAL SPINE WITHOUT CONTRAST
TECHNIQUE: Multidetector CT imaging of the head and cervical spine was
performed following the standard protocol without intravenous
contrast. Multiplanar CT image reconstructions of the cervical spine
were also generated.

[Series 4: head bone · axial · 0.41mm/px · z∈[-95,-45]mm · 2 of 77 slices shown]
[im 26/77  bone]
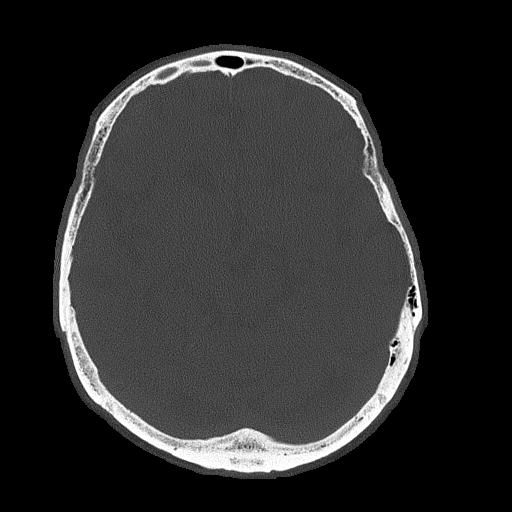
[im 51/77  bone]
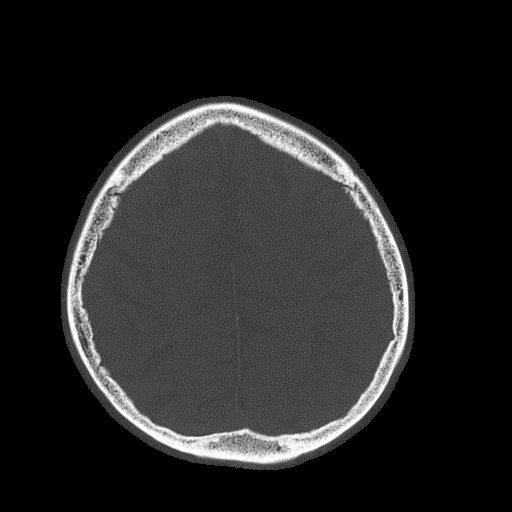

[Series 5: head without cor · coronal · non-contrast · 0.32mm/px · 2 of 59 slices shown]
[im 20/59  bone]
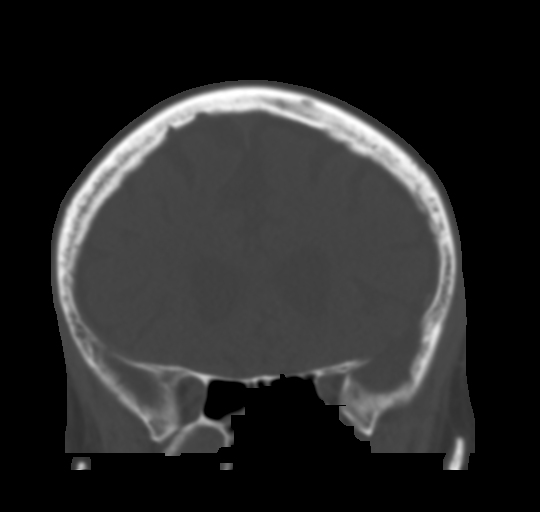
[im 39/59  bone]
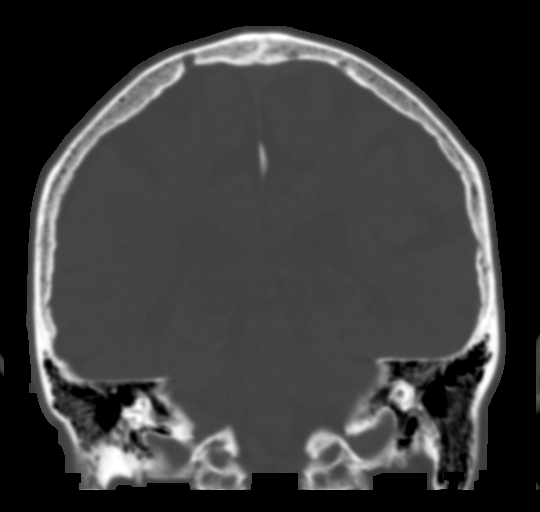

[Series 7: c_spine 2.0 st · axial · 0.34mm/px · z∈[-240,-182]mm · 2 of 87 slices shown, 3 images]
[im 29/87  soft-tissue]
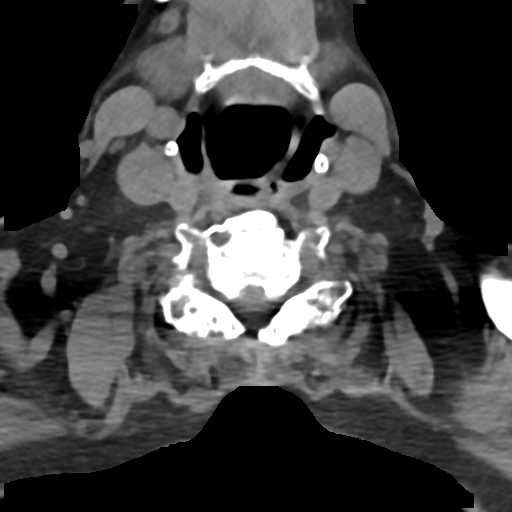
[im 29/87  bone]
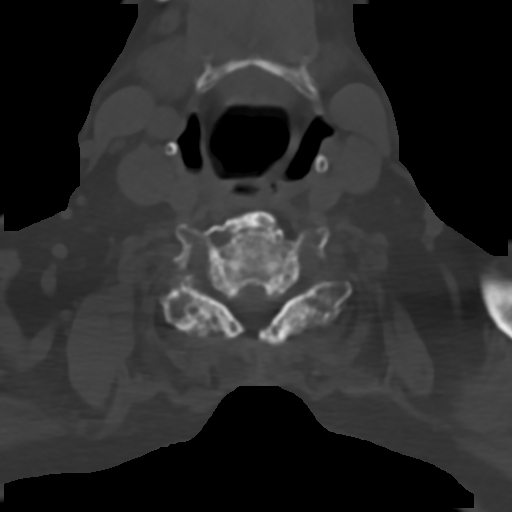
[im 58/87  bone]
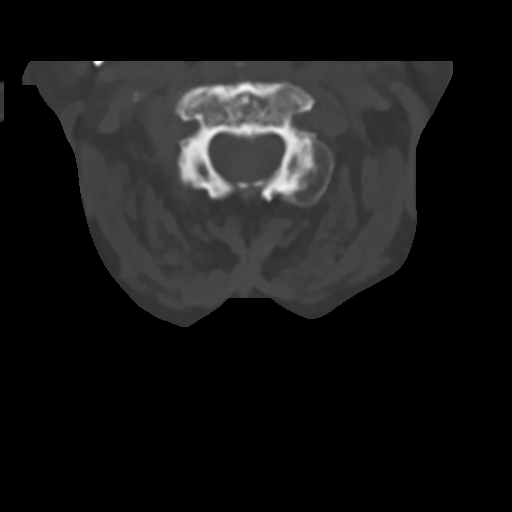

[Series 9: c_spine 2.0 sag bone · sagittal · 0.39mm/px · 5 of 55 slices shown]
[im 10/55  bone]
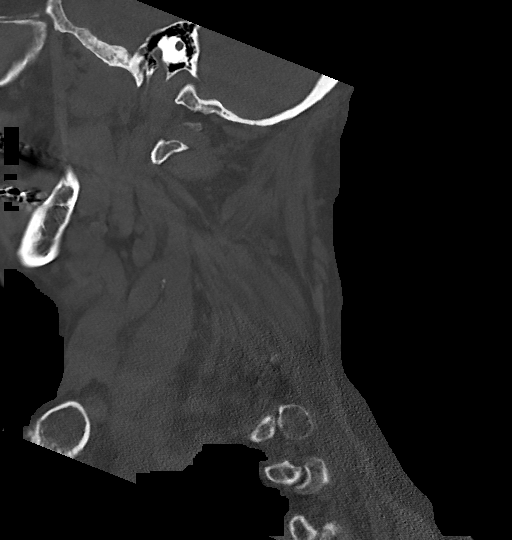
[im 19/55  bone]
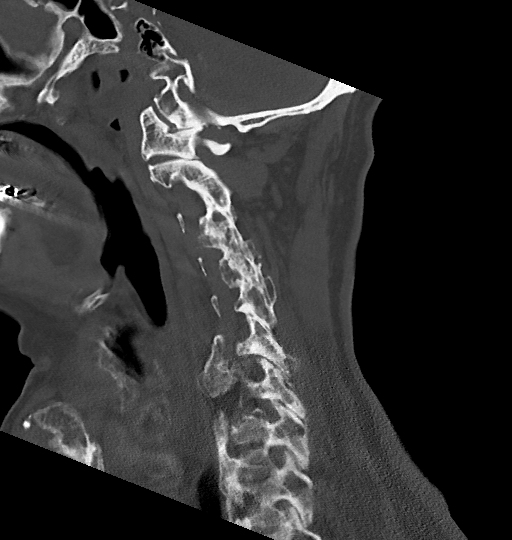
[im 28/55  bone]
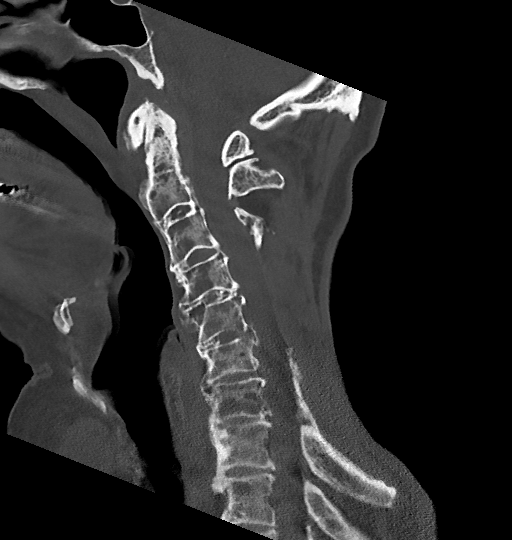
[im 37/55  bone]
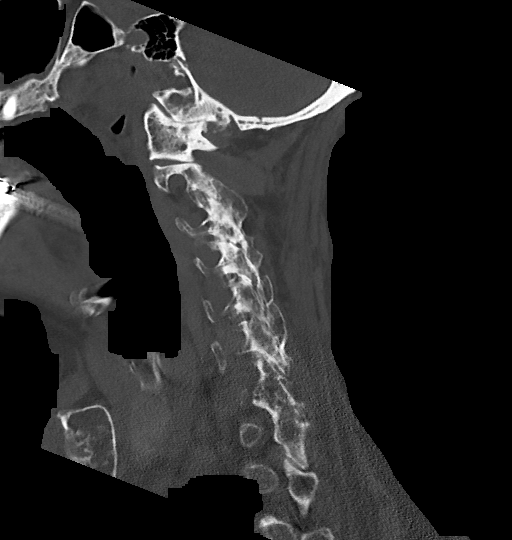
[im 46/55  bone]
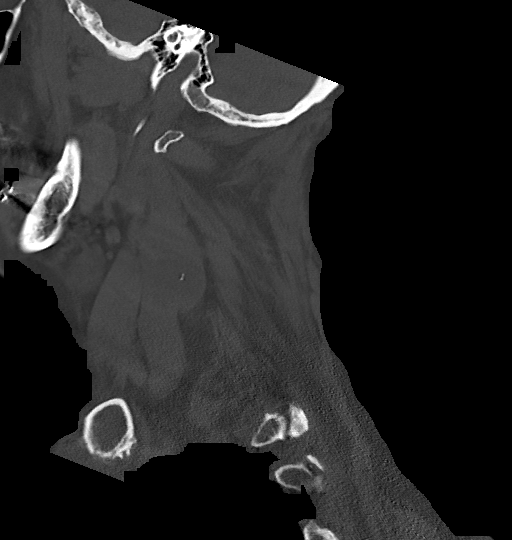

[Series 11: c_spine 2.0 orthogonals · axial · 0.21mm/px · z∈[-261,-224]mm · 2 of 91 slices shown]
[im 31/91  bone]
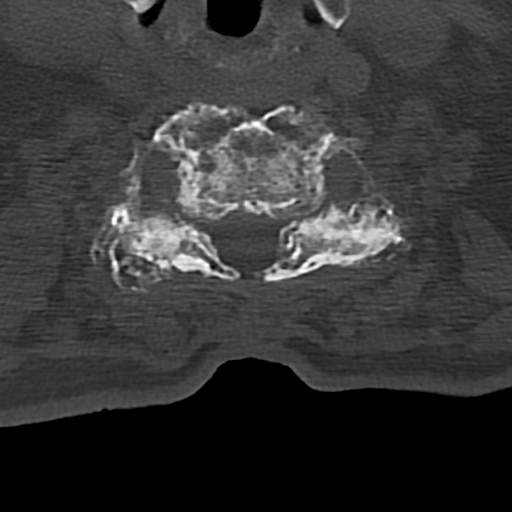
[im 61/91  bone]
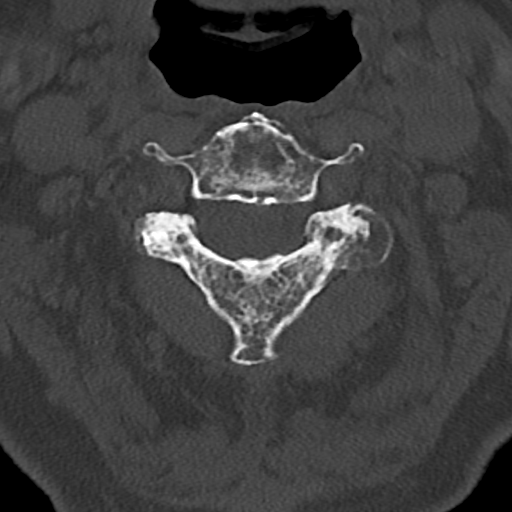

[13 of 33 positions shown; findings below may reference images not displayed]

FINDINGS: CT HEAD FINDINGS

There is again noted complete opacification of the right frontal,
ethmoid and maxillary sinuses consistent with chronic sinusitis.
Bony calvarium is unremarkable. Mild diffuse cortical atrophy is
noted. Mild chronic ischemic white matter disease is noted. No mass
effect or midline shift is noted. Ventricular size is within normal
limits. There is no evidence of mass lesion, hemorrhage or acute
infarction.

CT CERVICAL SPINE FINDINGS

No definite fracture or spondylolisthesis is noted. Patient appears
to be status post laminectomy of C3, C4, C5, C6 and C7. Extensive
degenerative changes seen involving posterior facet joints at these
levels. Mild degenerative disc disease is noted at C6-7 and C7-T1.
Anterior osteophyte formation is noted at multiple levels.
Visualized lung apices are unremarkable.
IMPRESSION: Stable findings consistent with chronic sinusitis of the right
frontal, ethmoid and maxillary sinuses. Mild diffuse cortical
atrophy. Mild chronic ischemic white matter disease. No acute
intracranial abnormality seen.

Extensive postsurgical and degenerative changes in the cervical
spine as described above. No acute abnormality seen in the cervical
spine.

## 2016-11-24 ENCOUNTER — Telehealth: Payer: Self-pay

## 2016-11-24 NOTE — Telephone Encounter (Signed)
Patient called to let you know that the extra strength tylenol that you suggested is working great and he has gained all of his lost mobility.

## 2016-11-24 NOTE — Telephone Encounter (Signed)
That is a fantastic update!!  Thank you.

## 2016-11-30 DIAGNOSIS — B351 Tinea unguium: Secondary | ICD-10-CM | POA: Diagnosis not present

## 2016-11-30 DIAGNOSIS — E1142 Type 2 diabetes mellitus with diabetic polyneuropathy: Secondary | ICD-10-CM | POA: Diagnosis not present

## 2016-12-07 DIAGNOSIS — H401122 Primary open-angle glaucoma, left eye, moderate stage: Secondary | ICD-10-CM | POA: Diagnosis not present

## 2016-12-17 DIAGNOSIS — H401122 Primary open-angle glaucoma, left eye, moderate stage: Secondary | ICD-10-CM | POA: Diagnosis not present

## 2016-12-17 DIAGNOSIS — H401114 Primary open-angle glaucoma, right eye, indeterminate stage: Secondary | ICD-10-CM | POA: Diagnosis not present

## 2017-01-04 ENCOUNTER — Encounter: Payer: Self-pay | Admitting: Family Medicine

## 2017-01-04 ENCOUNTER — Ambulatory Visit (INDEPENDENT_AMBULATORY_CARE_PROVIDER_SITE_OTHER): Payer: Medicare Other | Admitting: Family Medicine

## 2017-01-04 VITALS — BP 110/52 | HR 69 | Wt 180.5 lb

## 2017-01-04 DIAGNOSIS — I10 Essential (primary) hypertension: Secondary | ICD-10-CM

## 2017-01-04 DIAGNOSIS — E118 Type 2 diabetes mellitus with unspecified complications: Secondary | ICD-10-CM

## 2017-01-04 DIAGNOSIS — M5000 Cervical disc disorder with myelopathy, unspecified cervical region: Secondary | ICD-10-CM

## 2017-01-04 LAB — HEMOGLOBIN A1C: HEMOGLOBIN A1C: 7.6 % — AB (ref 4.6–6.5)

## 2017-01-04 LAB — COMPREHENSIVE METABOLIC PANEL
ALK PHOS: 40 U/L (ref 39–117)
ALT: 9 U/L (ref 0–53)
AST: 8 U/L (ref 0–37)
Albumin: 3.7 g/dL (ref 3.5–5.2)
BILIRUBIN TOTAL: 0.6 mg/dL (ref 0.2–1.2)
BUN: 19 mg/dL (ref 6–23)
CO2: 30 meq/L (ref 19–32)
Calcium: 9.5 mg/dL (ref 8.4–10.5)
Chloride: 102 mEq/L (ref 96–112)
Creatinine, Ser: 0.77 mg/dL (ref 0.40–1.50)
GFR: 100.98 mL/min (ref >60.00)
GLUCOSE: 128 mg/dL — AB (ref 70–99)
Potassium: 4.5 mEq/L (ref 3.5–5.1)
SODIUM: 136 meq/L (ref 135–145)
Total Protein: 7.6 g/dL (ref 6.0–8.3)

## 2017-01-04 NOTE — Assessment & Plan Note (Signed)
Reasonable control.  Check a1c today.

## 2017-01-04 NOTE — Assessment & Plan Note (Signed)
Well controlled. No changes made today. 

## 2017-01-04 NOTE — Progress Notes (Signed)
Subjective:   Patient ID: Nathan Cunningham, male    DOB: [birth year], 83 y.o.   MRN: [id number]  Nathan Cunningham is a pleasant 83 y.o. year old male who presents to clinic today with Follow-up  on 01/04/2017  HPI:    HTN- has been well controlled on lower dose of Micardis ( decreased in 04/2016 due to sx of orthostasis). Denies dizziness, CP, HA or SOB.  Lab Results  Component Value Date   CREATININE 0.73 10/06/2016   BP Readings from Last 3 Encounters:  01/04/17 (!) 110/52  10/06/16 120/60  09/07/16 110/60   We also d/c'd his elavil due to constipation and constipation has improved with this and adding miralax.  He was recently started on Lyrica by Podiatry and this has been helping with his neuropathy.  DM- taking glipizide and Kombiglyze, and Januvia. No noticeable side effects.  Checking FSBS three times weekly- 150- 233 not fasting.  Denies any symptoms of hypoglycemia.    Arthritis pain much improved with scheduled tylenol twice daily.  Lab Results  Component Value Date   HGBA1C 8.0 (H) 10/06/2016   Lab Results  Component Value Date   CHOL 134 07/06/2016   HDL 35.40 (L) 07/06/2016   LDLCALC 71 07/06/2016   LDLDIRECT 81.0 02/04/2015   TRIG 138.0 07/06/2016   CHOLHDL 4 07/06/2016   Lab Results  Component Value Date   NA 133 (L) 10/06/2016   K 4.9 10/06/2016   CL 100 10/06/2016   CO2 30 10/06/2016   Current Outpatient Prescriptions on File Prior to Visit  Medication Sig Dispense Refill  . aspirin 325 MG tablet Take 162 mg by mouth daily.    . Chromium Picolinate (RA CHROMIUM PICOLINATE) 400 MCG TABS Take 1 tablet by mouth daily.      . cyanocobalamin (,VITAMIN B-12,) 1000 MCG/ML injection Inject 102mcg every 60days IM (every other month) 1 mL 0  . dorzolamide-timolol (COSOPT) 22.3-6.8 MG/ML ophthalmic solution Place 1 drop into both eyes 2 (two) times daily.     . fish oil-omega-3 fatty acids 1000 MG capsule Take 2 g by mouth 3 (three) times daily.  Take six daily    . glucose blood (ONE TOUCH ULTRA TEST) test strip CHECK BLOOD SUGAR ONCE DAILY AND AS DIRECTED. 100 each 1  . Magnesium-Calcium-Folic Acid (MAGNEBIND 350) 400-200-1 MG TABS Take 1 tablet by mouth daily.      . Melatonin 5 MG CAPS Take 1 each by mouth at bedtime.     . Multiple Vitamins-Minerals (EYE-VITES) TABS Take 1 tablet by mouth 2 (two) times daily.      . pregabalin (LYRICA) 75 MG capsule Take 1 capsule (75 mg total) by mouth daily. 90 capsule 0  . Saxagliptin-Metformin (KOMBIGLYZE XR) 12-998 MG TB24 Take 1 each by mouth daily. 90 tablet 1  . sitaGLIPtin (JANUVIA) 100 MG tablet Take 1 tablet (100 mg total) by mouth daily. 90 tablet 3  . tamsulosin (FLOMAX) 0.4 MG CAPS capsule Take 1 capsule (0.4 mg total) by mouth daily. 90 capsule 0  . telmisartan (MICARDIS) 40 MG tablet TAKE 1 TABLET DAILY (Patient taking differently: Take 40 mg by mouth daily. TAKE 1 TABLET DAILY) 90 tablet 3   No current facility-administered medications on file prior to visit.     No Known Allergies  Past Medical History:  Diagnosis Date  . Adenomatous polyp of colon 03/2003  . Anemia   . Constipation   . Diverticulosis   . Dizziness   .  ETOH abuse    binge drinking  . Hemorrhoids   . Hyperlipidemia   . Hypertension   . Macular degeneration    Right eye  . OSA on CPAP   . Type II diabetes mellitus ([place])     Past Surgical History:  Procedure Laterality Date  . ANTERIOR CERVICAL DECOMP/DISCECTOMY FUSION    . APPENDECTOMY  1935  . BACK SURGERY    . CATARACT EXTRACTION Left   . INGUINAL HERNIA REPAIR  1935  . TONSILLECTOMY      Family History  Problem Relation Age of Onset  . Heart disease Father   . Stroke Father 79    Social History   Social History  . Marital status: Divorced    Spouse name: N/A  . Number of children: 2  . Years of education: N/A   Occupational History  . retired    Social History Main Topics  . Smoking status: Never Smoker  . Smokeless  tobacco: Never Used  . Alcohol use 12.6 oz/week    14 Standard drinks or equivalent, 7 Shots of liquor per week     Comment: "1 mixed drink/day"  . Drug use: No  . Sexual activity: No   Other Topics Concern  . Not on file   Social History Narrative   Retired from [employer]   Goes to [place] sneakers 3 times a week.   Has a girlfriend.   Does not have a living will yet.   Desires CPR, does not prolonged life support if futile.         The PMH, PSH, Social History, Family History, Medications, and allergies have been reviewed in [facility], and have been updated if relevant.   Review of Systems  Constitutional: Negative for fatigue, fever and unexpected weight change.  HENT: Negative.   Eyes: Negative.   Respiratory: Negative.   Cardiovascular: Negative.   Gastrointestinal: Negative.   Endocrine: Negative.   Genitourinary: Negative for difficulty urinating.  Musculoskeletal: Negative.   Allergic/Immunologic: Negative.   Neurological: Negative.   Hematological: Negative.   Psychiatric/Behavioral: Negative.   All other systems reviewed and are negative.      Objective:    BP (!) 110/52   Pulse 69   Wt 180 lb 8 oz (81.9 kg)   SpO2 96%   BMI 28.27 kg/m    Physical Exam  Constitutional: He is oriented to person, place, and time. He appears well-developed and well-nourished. No distress.  HENT:  Head: Normocephalic and atraumatic.  Eyes: Conjunctivae are normal.  Neck: Normal range of motion. Neck supple.  Cardiovascular: Normal rate and regular rhythm.   Pulmonary/Chest: Effort normal and breath sounds normal.  Musculoskeletal: Normal range of motion. He exhibits no edema.  Neurological: He is alert and oriented to person, place, and time. No cranial nerve deficit.  Skin: Skin is dry. He is not diaphoretic.  Psychiatric: He has a normal mood and affect. His behavior is normal. Judgment and thought content normal.  Nursing note and vitals reviewed.           Assessment & Plan:   Essential hypertension  Diabetes mellitus with complication ([place]) No Follow-up on file.

## 2017-01-04 NOTE — Assessment & Plan Note (Signed)
Improved with scheduled tylenol. No changes made to rxs today.

## 2017-01-06 ENCOUNTER — Telehealth: Payer: Self-pay | Admitting: Family Medicine

## 2017-01-06 NOTE — Telephone Encounter (Signed)
I left a message on patient's voice mail to call and schedule appointment in 6 months.

## 2017-01-06 NOTE — Telephone Encounter (Signed)
Follow up in 6 months 

## 2017-01-06 NOTE — Telephone Encounter (Signed)
Patient received his lab results and is asking when Dr.Aron wants him to follow up with her? Dr.Aron,please send your response back to [place].

## 2017-01-07 NOTE — Telephone Encounter (Signed)
Ok thank you 

## 2017-02-07 ENCOUNTER — Ambulatory Visit: Payer: Medicare Other | Admitting: Family Medicine

## 2017-02-07 DIAGNOSIS — Z79899 Other long term (current) drug therapy: Secondary | ICD-10-CM | POA: Diagnosis not present

## 2017-02-07 DIAGNOSIS — H401132 Primary open-angle glaucoma, bilateral, moderate stage: Secondary | ICD-10-CM | POA: Diagnosis not present

## 2017-02-10 ENCOUNTER — Telehealth: Payer: Self-pay

## 2017-02-10 NOTE — Telephone Encounter (Signed)
pts last B 12 was 09/07/16; pt had B 12 lab 10/06/16 and was advised Vit B12 was elevated and does not need B 12 yet. F/u 3 months. Pt wants to know when should get next B 12. Pt request cb.

## 2017-02-10 NOTE — Telephone Encounter (Signed)
Let's check his Vit B12 tomorrow or next week and then make a plan.

## 2017-02-10 NOTE — Telephone Encounter (Signed)
Pt was notified, he said he will call to make an appt.

## 2017-03-02 DIAGNOSIS — E1142 Type 2 diabetes mellitus with diabetic polyneuropathy: Secondary | ICD-10-CM | POA: Diagnosis not present

## 2017-03-02 DIAGNOSIS — L851 Acquired keratosis [keratoderma] palmaris et plantaris: Secondary | ICD-10-CM | POA: Diagnosis not present

## 2017-03-02 DIAGNOSIS — B351 Tinea unguium: Secondary | ICD-10-CM | POA: Diagnosis not present

## 2017-03-08 ENCOUNTER — Telehealth: Payer: Self-pay

## 2017-03-08 MED ORDER — PREGABALIN 75 MG PO CAPS: 75.0000 mg | ORAL_CAPSULE | Freq: Two times a day (BID) | ORAL | 3 refills | Status: DC

## 2017-03-08 NOTE — Telephone Encounter (Signed)
Let's try to increase increase it to 75 mg twice daily.  eRx sent.

## 2017-03-08 NOTE — Telephone Encounter (Signed)
Pt saw Dr Elvina Mattes podiatrist and he recommended asking Dr [name] to increase lyrica to 150 mg due to feet hurting and feeling of needles in feet. Pt presently taking lyrica 75 mg. Pt request cb. [facility] mail order.Please advise. Last seen 01/04/17.

## 2017-03-09 NOTE — Telephone Encounter (Signed)
Pt was notified, pt verbalized understanding.   

## 2017-03-15 ENCOUNTER — Other Ambulatory Visit: Payer: Self-pay

## 2017-03-15 MED ORDER — PREGABALIN 75 MG PO CAPS: 75.0000 mg | ORAL_CAPSULE | Freq: Two times a day (BID) | ORAL | 3 refills | Status: DC

## 2017-03-15 NOTE — Telephone Encounter (Signed)
Lyrica was faxed to  [place], [place] [phone] (Phone) [phone] (Fax)

## 2017-03-15 NOTE — Telephone Encounter (Signed)
Pt called to ck status of lyrica refill; advised refilled today. Pt will ck with pharmacy later today.nothing further needed.

## 2017-03-29 ENCOUNTER — Other Ambulatory Visit (INDEPENDENT_AMBULATORY_CARE_PROVIDER_SITE_OTHER): Payer: Medicare Other

## 2017-03-29 ENCOUNTER — Telehealth: Payer: Self-pay | Admitting: Family Medicine

## 2017-03-29 DIAGNOSIS — E539 Vitamin B deficiency, unspecified: Secondary | ICD-10-CM

## 2017-03-29 LAB — VITAMIN B12: VITAMIN B 12: 856 pg/mL (ref 211–911)

## 2017-03-29 NOTE — Telephone Encounter (Signed)
Yes okay to schedule.

## 2017-03-29 NOTE — Telephone Encounter (Signed)
Appointment 8/21 pt aware

## 2017-03-29 NOTE — Telephone Encounter (Signed)
Pt called stated you wanted him to have labs for b12 in 3 months   No order in system  Pt would like to come today @ 11:30  Is it ok to schedule?    Best number [phone]

## 2017-03-31 ENCOUNTER — Telehealth: Payer: Self-pay | Admitting: Family Medicine

## 2017-03-31 NOTE — Telephone Encounter (Signed)
Patient scheduled for nurse visit on 04/06/17 @330  pm for B12 injection

## 2017-03-31 NOTE — Telephone Encounter (Signed)
Patient called to find out when he needs to get his next b-12 shot.  He's not sure what the current schedule is.

## 2017-03-31 NOTE — Telephone Encounter (Signed)
Every 3 months

## 2017-04-06 ENCOUNTER — Ambulatory Visit (INDEPENDENT_AMBULATORY_CARE_PROVIDER_SITE_OTHER): Payer: Medicare Other

## 2017-04-06 DIAGNOSIS — E538 Deficiency of other specified B group vitamins: Secondary | ICD-10-CM

## 2017-04-06 MED ORDER — CYANOCOBALAMIN 1000 MCG/ML IJ SOLN
1000.0000 ug | Freq: Once | INTRAMUSCULAR | Status: AC
  Administered 2017-04-06: 1000 ug via INTRAMUSCULAR

## 2017-04-07 ENCOUNTER — Telehealth: Payer: Self-pay | Admitting: Family Medicine

## 2017-04-07 NOTE — Telephone Encounter (Signed)
Pt dropped off [place] department of transporation forms to be filled out  Please fill out page 2 section E and F Thank you  Forms in dr Elonda Husky in box

## 2017-04-13 NOTE — Telephone Encounter (Signed)
Forms completed

## 2017-04-19 DIAGNOSIS — G4733 Obstructive sleep apnea (adult) (pediatric): Secondary | ICD-10-CM | POA: Diagnosis not present

## 2017-05-05 DIAGNOSIS — H02836 Dermatochalasis of left eye, unspecified eyelid: Secondary | ICD-10-CM | POA: Diagnosis not present

## 2017-05-05 DIAGNOSIS — H02832 Dermatochalasis of right lower eyelid: Secondary | ICD-10-CM | POA: Diagnosis not present

## 2017-05-05 DIAGNOSIS — H02833 Dermatochalasis of right eye, unspecified eyelid: Secondary | ICD-10-CM | POA: Diagnosis not present

## 2017-05-05 DIAGNOSIS — H02835 Dermatochalasis of left lower eyelid: Secondary | ICD-10-CM | POA: Diagnosis not present

## 2017-05-05 DIAGNOSIS — H5340 Unspecified visual field defects: Secondary | ICD-10-CM | POA: Diagnosis not present

## 2017-05-05 DIAGNOSIS — H04203 Unspecified epiphora, bilateral lacrimal glands: Secondary | ICD-10-CM | POA: Diagnosis not present

## 2017-05-05 DIAGNOSIS — L908 Other atrophic disorders of skin: Secondary | ICD-10-CM | POA: Diagnosis not present

## 2017-05-05 DIAGNOSIS — Z7189 Other specified counseling: Secondary | ICD-10-CM | POA: Diagnosis not present

## 2017-05-05 DIAGNOSIS — H02403 Unspecified ptosis of bilateral eyelids: Secondary | ICD-10-CM | POA: Diagnosis not present

## 2017-05-05 DIAGNOSIS — H02105 Unspecified ectropion of left lower eyelid: Secondary | ICD-10-CM | POA: Diagnosis not present

## 2017-05-05 DIAGNOSIS — H02106 Unspecified ectropion of left eye, unspecified eyelid: Secondary | ICD-10-CM | POA: Diagnosis not present

## 2017-05-10 ENCOUNTER — Telehealth: Payer: Self-pay | Admitting: Family Medicine

## 2017-05-10 NOTE — Telephone Encounter (Signed)
Noted Appointment scheduled for 05/19/17.

## 2017-05-10 NOTE — Telephone Encounter (Signed)
I would suggest he receive the regular flu shot- fewer adverse reactions.

## 2017-05-10 NOTE — Telephone Encounter (Signed)
Patient is coming in for a flu shot on 05/19/17.  Patient wants to know if Dr.Aron recommends he get the high dose flu shot?

## 2017-05-19 ENCOUNTER — Ambulatory Visit: Payer: Medicare Other

## 2017-05-19 ENCOUNTER — Other Ambulatory Visit: Payer: Self-pay

## 2017-05-19 MED ORDER — TELMISARTAN 40 MG PO TABS: ORAL_TABLET | ORAL | 0 refills | Status: DC

## 2017-05-24 ENCOUNTER — Other Ambulatory Visit: Payer: Self-pay

## 2017-05-24 MED ORDER — SAXAGLIPTIN-METFORMIN ER 5-1000 MG PO TB24: 1.0000 | ORAL_TABLET | Freq: Every day | ORAL | 0 refills | Status: DC

## 2017-05-25 ENCOUNTER — Ambulatory Visit (INDEPENDENT_AMBULATORY_CARE_PROVIDER_SITE_OTHER): Payer: Medicare Other

## 2017-05-25 DIAGNOSIS — Z23 Encounter for immunization: Secondary | ICD-10-CM

## 2017-06-02 ENCOUNTER — Ambulatory Visit: Payer: Medicare Other

## 2017-06-14 DIAGNOSIS — E1142 Type 2 diabetes mellitus with diabetic polyneuropathy: Secondary | ICD-10-CM | POA: Diagnosis not present

## 2017-06-14 DIAGNOSIS — B351 Tinea unguium: Secondary | ICD-10-CM | POA: Diagnosis not present

## 2017-06-27 ENCOUNTER — Ambulatory Visit: Payer: Medicare Other | Admitting: Family Medicine

## 2017-06-27 ENCOUNTER — Encounter: Payer: Self-pay | Admitting: Family Medicine

## 2017-06-27 VITALS — BP 108/64 | HR 68 | Temp 97.6°F | Ht 67.0 in | Wt 192.0 lb

## 2017-06-27 DIAGNOSIS — R5383 Other fatigue: Secondary | ICD-10-CM | POA: Diagnosis not present

## 2017-06-27 DIAGNOSIS — R5382 Chronic fatigue, unspecified: Secondary | ICD-10-CM

## 2017-06-27 DIAGNOSIS — E118 Type 2 diabetes mellitus with unspecified complications: Secondary | ICD-10-CM | POA: Diagnosis not present

## 2017-06-27 DIAGNOSIS — R5381 Other malaise: Secondary | ICD-10-CM

## 2017-06-27 DIAGNOSIS — E538 Deficiency of other specified B group vitamins: Secondary | ICD-10-CM

## 2017-06-27 DIAGNOSIS — I10 Essential (primary) hypertension: Secondary | ICD-10-CM

## 2017-06-27 LAB — COMPREHENSIVE METABOLIC PANEL
ALBUMIN: 3.6 g/dL (ref 3.5–5.2)
ALT: 8 U/L (ref 0–53)
AST: 10 U/L (ref 0–37)
Alkaline Phosphatase: 50 U/L (ref 39–117)
BUN: 21 mg/dL (ref 6–23)
CO2: 30 meq/L (ref 19–32)
Calcium: 9.4 mg/dL (ref 8.4–10.5)
Chloride: 103 mEq/L (ref 96–112)
Creatinine, Ser: 0.76 mg/dL (ref 0.40–1.50)
GFR: 102.4 mL/min (ref >60.00)
Glucose, Bld: 212 mg/dL — ABNORMAL HIGH (ref 70–99)
POTASSIUM: 4.7 meq/L (ref 3.5–5.1)
SODIUM: 138 meq/L (ref 135–145)
Total Bilirubin: 0.8 mg/dL (ref 0.2–1.2)
Total Protein: 7.4 g/dL (ref 6.0–8.3)

## 2017-06-27 LAB — VITAMIN B12: VITAMIN B 12: 615 pg/mL (ref 211–911)

## 2017-06-27 LAB — HEMOGLOBIN A1C: HEMOGLOBIN A1C: 7.6 % — AB (ref 4.6–6.5)

## 2017-06-27 LAB — VITAMIN D 25 HYDROXY (VIT D DEFICIENCY, FRACTURES): VITD: 30.71 ng/mL (ref 30.00–100.00)

## 2017-06-27 NOTE — Patient Instructions (Signed)
Great to see you. We will call you with your results from today.  Have a very Happy Thanksgiving!

## 2017-06-27 NOTE — Assessment & Plan Note (Addendum)
Due for IM Vit B12 today but we do not have it in office. Will place him on nurse visit schedule later this week. Check Vit B12 today.

## 2017-06-27 NOTE — Assessment & Plan Note (Signed)
Continue current rxs. Check a1c today.

## 2017-06-27 NOTE — Assessment & Plan Note (Signed)
Well controlled.  No changes made. 

## 2017-06-27 NOTE — Progress Notes (Signed)
Subjective:   Patient ID: Nathan Cunningham, male    DOB: 1924-11-16, 83 y.o.   MRN: [id number]  Nathan Cunningham is a pleasant 83 y.o. year old male who presents to clinic today with Follow-up (6 month-doing well. B12 shot)  on 06/27/2017  HPI:  Here with his wife, [name]. Doing well. Having eye lid surgery.  Just turned 83!  Vit b12 deficiency-  Due for IM vit B12 here today.  Lab Results  Component Value Date   [id number] 03/29/2017    HTN- has been well controlled on lower dose of Micardis - 40 mg daily ( decreased in 04/2016 due to symptoms of orthostasis). Denies dizziness, CP, HA or SOB.  Lab Results  Component Value Date   CREATININE 0.77 01/04/2017   BP Readings from Last 3 Encounters:  06/27/17 108/64  01/04/17 (!) 110/52  10/06/16 120/60    DM- taking glipizide and Kombiglyze, and Januvia. No noticeable side effects.  Checking FSBS three times weekly- 150- 233 not fasting.  Denies any symptoms of hypoglycemia.    Arthritis pain much improved with scheduled tylenol twice daily.  Also taking Lyrica which has helped with his neuropathy.  Lab Results  Component Value Date   HGBA1C 7.6 (H) 01/04/2017   Lab Results  Component Value Date   CHOL 134 07/06/2016   HDL 35.40 (L) 07/06/2016   LDLCALC 71 07/06/2016   LDLDIRECT 81.0 02/04/2015   TRIG 138.0 07/06/2016   CHOLHDL 4 07/06/2016   Lab Results  Component Value Date   NA 136 01/04/2017   K 4.5 01/04/2017   CL 102 01/04/2017   CO2 30 01/04/2017   Current Outpatient Medications on File Prior to Visit  Medication Sig Dispense Refill  . aspirin 325 MG tablet Take 162 mg by mouth daily.    . Chromium Picolinate (RA CHROMIUM PICOLINATE) 400 MCG TABS Take 1 tablet by mouth daily.      . cyanocobalamin (,VITAMIN B-12,) 1000 MCG/ML injection Inject 1029mcg every 60days IM (every other month) 1 mL 0  . dorzolamide-timolol (COSOPT) 22.3-6.8 MG/ML ophthalmic solution Place 1 drop into both eyes 2  (two) times daily.     . fish oil-omega-3 fatty acids 1000 MG capsule Take 2 g by mouth 3 (three) times daily. Take six daily    . glucose blood (ONE TOUCH ULTRA TEST) test strip CHECK BLOOD SUGAR ONCE DAILY AND AS DIRECTED. 100 each 1  . Magnesium-Calcium-Folic Acid (MAGNEBIND 423) 400-200-1 MG TABS Take 1 tablet by mouth daily.      . Melatonin 5 MG CAPS Take 1 each by mouth at bedtime.     . Multiple Vitamins-Minerals (EYE-VITES) TABS Take 1 tablet by mouth 2 (two) times daily.      . pregabalin (LYRICA) 75 MG capsule Take 1 capsule (75 mg total) by mouth 2 (two) times daily. 180 capsule 3  . Saxagliptin-Metformin (KOMBIGLYZE XR) 12-998 MG TB24 Take 1 each by mouth daily. 90 tablet 0  . sitaGLIPtin (JANUVIA) 100 MG tablet Take 1 tablet (100 mg total) by mouth daily. 90 tablet 3  . telmisartan (MICARDIS) 40 MG tablet TAKE 1 TABLET DAILY 90 tablet 0   No current facility-administered medications on file prior to visit.     No Known Allergies  Past Medical History:  Diagnosis Date  . Adenomatous polyp of colon 03/2003  . Anemia   . Constipation   . Diverticulosis   . Dizziness   . ETOH abuse    binge  drinking  . Hemorrhoids   . Hyperlipidemia   . Hypertension   . Macular degeneration    Right eye  . OSA on CPAP   . Type II diabetes mellitus ([place])     Past Surgical History:  Procedure Laterality Date  . ANTERIOR CERVICAL DECOMP/DISCECTOMY FUSION    . APPENDECTOMY  83  . BACK SURGERY    . CATARACT EXTRACTION Left   . INGUINAL HERNIA REPAIR  83  . TONSILLECTOMY      Family History  Problem Relation Age of Onset  . Heart disease Father   . Stroke Father 68    Social History   Socioeconomic History  . Marital status: Divorced    Spouse name: Not on file  . Number of children: 2  . Years of education: Not on file  . Highest education level: Not on file  Social Needs  . Financial resource strain: Not on file  . Food insecurity - worry: Not on file  . Food  insecurity - inability: Not on file  . Transportation needs - medical: Not on file  . Transportation needs - non-medical: Not on file  Occupational History  . Occupation: retired  Tobacco Use  . Smoking status: Never Smoker  . Smokeless tobacco: Never Used  Substance and Sexual Activity  . Alcohol use: Yes    Alcohol/week: 12.6 oz    Types: 14 Standard drinks or equivalent, 7 Shots of liquor per week    Comment: "1 mixed drink/day"  . Drug use: No  . Sexual activity: No  Other Topics Concern  . Not on file  Social History Narrative   Retired from [employer]   Goes to [employer] sneakers 3 times a week.   Has a girlfriend.   Does not have a living will yet.   Desires CPR, does not prolonged life support if futile.         The PMH, PSH, Social History, Family History, Medications, and allergies have been reviewed in [facility], and have been updated if relevant.   Review of Systems  Constitutional: Positive for fatigue. Negative for fever and unexpected weight change.  HENT: Negative.   Eyes: Negative.   Respiratory: Negative.   Cardiovascular: Negative.   Gastrointestinal: Negative.   Endocrine: Negative.   Genitourinary: Negative.   Musculoskeletal: Negative.   Allergic/Immunologic: Negative.   Neurological: Negative.   Hematological: Negative.   Psychiatric/Behavioral: Negative.   All other systems reviewed and are negative.      Objective:    BP 108/64 (BP Location: Left Arm, Patient Position: Sitting, Cuff Size: Normal)   Pulse 68   Temp 97.6 F (36.4 C) (Oral)   Ht 5\' 7"  (1.702 m)   Wt 192 lb (87.1 kg)   SpO2 98%   BMI 30.07 kg/m    Physical Exam  Constitutional: He is oriented to person, place, and time. He appears well-developed and well-nourished. No distress.  HENT:  Head: Normocephalic and atraumatic.  Eyes: Conjunctivae are normal.  Neck: Normal range of motion. Neck supple.  Cardiovascular: Normal rate and regular rhythm.  Pulmonary/Chest: Effort  normal and breath sounds normal.  Musculoskeletal: Normal range of motion. He exhibits no edema.  Neurological: He is alert and oriented to person, place, and time. No cranial nerve deficit.  Skin: Skin is dry. He is not diaphoretic.  Psychiatric: He has a normal mood and affect. His behavior is normal. Judgment and thought content normal.  Nursing note and vitals reviewed.  Assessment & Plan:   Diabetes mellitus with complication ([place]) - Plan: Hemoglobin A1c, Comprehensive metabolic panel  Q67 deficiency - Plan: B12  Chronic fatigue and malaise  Essential hypertension No Follow-up on file.

## 2017-07-01 DIAGNOSIS — H02834 Dermatochalasis of left upper eyelid: Secondary | ICD-10-CM | POA: Diagnosis not present

## 2017-07-01 DIAGNOSIS — H02831 Dermatochalasis of right upper eyelid: Secondary | ICD-10-CM | POA: Diagnosis not present

## 2017-07-01 DIAGNOSIS — H02105 Unspecified ectropion of left lower eyelid: Secondary | ICD-10-CM | POA: Diagnosis not present

## 2017-07-01 DIAGNOSIS — H02103 Unspecified ectropion of right eye, unspecified eyelid: Secondary | ICD-10-CM | POA: Diagnosis not present

## 2017-07-07 ENCOUNTER — Ambulatory Visit: Payer: Medicare Other | Admitting: Family Medicine

## 2017-07-18 ENCOUNTER — Other Ambulatory Visit: Payer: Medicare Other

## 2017-07-18 ENCOUNTER — Ambulatory Visit: Payer: Medicare Other | Admitting: Internal Medicine

## 2017-07-28 ENCOUNTER — Inpatient Hospital Stay: Payer: Medicare Other | Attending: Internal Medicine

## 2017-07-28 ENCOUNTER — Inpatient Hospital Stay (HOSPITAL_BASED_OUTPATIENT_CLINIC_OR_DEPARTMENT_OTHER): Payer: Medicare Other | Admitting: Internal Medicine

## 2017-07-28 VITALS — BP 128/73 | HR 56 | Temp 97.8°F | Resp 18 | Ht 67.0 in | Wt 197.8 lb

## 2017-07-28 DIAGNOSIS — Z8719 Personal history of other diseases of the digestive system: Secondary | ICD-10-CM | POA: Diagnosis not present

## 2017-07-28 DIAGNOSIS — Z86010 Personal history of colonic polyps: Secondary | ICD-10-CM | POA: Diagnosis not present

## 2017-07-28 DIAGNOSIS — D649 Anemia, unspecified: Secondary | ICD-10-CM | POA: Diagnosis not present

## 2017-07-28 DIAGNOSIS — K5900 Constipation, unspecified: Secondary | ICD-10-CM

## 2017-07-28 DIAGNOSIS — R42 Dizziness and giddiness: Secondary | ICD-10-CM | POA: Diagnosis not present

## 2017-07-28 DIAGNOSIS — G4730 Sleep apnea, unspecified: Secondary | ICD-10-CM

## 2017-07-28 DIAGNOSIS — E119 Type 2 diabetes mellitus without complications: Secondary | ICD-10-CM | POA: Insufficient documentation

## 2017-07-28 DIAGNOSIS — D472 Monoclonal gammopathy: Secondary | ICD-10-CM | POA: Insufficient documentation

## 2017-07-28 DIAGNOSIS — I10 Essential (primary) hypertension: Secondary | ICD-10-CM

## 2017-07-28 DIAGNOSIS — Z79899 Other long term (current) drug therapy: Secondary | ICD-10-CM

## 2017-07-28 DIAGNOSIS — Z7982 Long term (current) use of aspirin: Secondary | ICD-10-CM | POA: Diagnosis not present

## 2017-07-28 DIAGNOSIS — E785 Hyperlipidemia, unspecified: Secondary | ICD-10-CM | POA: Insufficient documentation

## 2017-07-28 DIAGNOSIS — C9250 Acute myelomonocytic leukemia, not having achieved remission: Secondary | ICD-10-CM

## 2017-07-28 LAB — BASIC METABOLIC PANEL
Anion gap: 6 (ref 5–15)
BUN: 22 mg/dL — AB (ref 6–20)
CHLORIDE: 99 mmol/L — AB (ref 101–111)
CO2: 29 mmol/L (ref 22–32)
Calcium: 8.7 mg/dL — ABNORMAL LOW (ref 8.9–10.3)
Creatinine, Ser: 0.94 mg/dL (ref 0.61–1.24)
GFR calc non Af Amer: >60 mL/min (ref >60)
Glucose, Bld: 220 mg/dL — ABNORMAL HIGH (ref 65–99)
POTASSIUM: 4.6 mmol/L (ref 3.5–5.1)
SODIUM: 134 mmol/L — AB (ref 135–145)

## 2017-07-28 LAB — CBC WITH DIFFERENTIAL/PLATELET
BASOS ABS: 0 10*3/uL (ref 0.0–0.1)
Basophils Relative: 0 %
EOS ABS: 0 10*3/uL (ref 0.0–0.7)
Eosinophils Relative: 0 %
HEMATOCRIT: 33.9 % — AB (ref 40.0–52.0)
Hemoglobin: 11.3 g/dL — ABNORMAL LOW (ref 13.0–18.0)
Lymphocytes Relative: 26 %
Lymphs Abs: 1.7 10*3/uL (ref 0.7–4.0)
MCH: 30.9 pg (ref 26.0–34.0)
MCHC: 33.5 g/dL (ref 32.0–36.0)
MCV: 92.4 fL (ref 80.0–100.0)
MONO ABS: 2.2 10*3/uL — AB (ref 0.1–1.0)
Monocytes Relative: 34 %
NEUTROS ABS: 2.6 10*3/uL (ref 1.7–7.7)
Neutrophils Relative %: 40 %
Platelets: 186 10*3/uL (ref 150–440)
RBC: 3.66 MIL/uL — AB (ref 4.40–5.90)
RDW: 14.1 % (ref 11.5–14.5)
WBC: 6.6 10*3/uL (ref 3.8–10.6)

## 2017-07-28 NOTE — Progress Notes (Signed)
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 He is alone.  Denies any new symptoms.  He denies any unusual night sweats or weight loss or fevers. No lumps or bumps. Denies any new onset of back pain.  REVIEW OF SYSTEMS:  A complete 10 point review of system is done which is negative except mentioned above/history of present illness.   PAST MEDICAL HISTORY :  Past Medical History:  Diagnosis Date  . Adenomatous polyp of colon 03/2003  . Anemia   . Constipation   . Diverticulosis   . Dizziness   . ETOH abuse    binge drinking  . Hemorrhoids   . Hyperlipidemia   . Hypertension   . Macular degeneration    Right eye  . OSA on CPAP   . Type II diabetes mellitus ([place])     PAST SURGICAL HISTORY :   Past Surgical History:  Procedure Laterality Date  . ANTERIOR CERVICAL DECOMP/DISCECTOMY FUSION    . APPENDECTOMY  1935  . BACK SURGERY    . CATARACT EXTRACTION Left   . INGUINAL HERNIA REPAIR  1935  . TONSILLECTOMY      FAMILY HISTORY :   Family History  Problem Relation Age of Onset  . Heart disease Father   . Stroke Father 50    SOCIAL HISTORY:   Social History   Tobacco Use  . Smoking status: Never Smoker  .  Smokeless tobacco: Never Used  Substance Use Topics  . Alcohol use: Yes    Alcohol/week: 12.6 oz    Types: 14 Standard drinks or equivalent, 7 Shots of liquor per week    Comment: "1 mixed drink/day"  . Drug use: No    ALLERGIES:  has No Known Allergies.  MEDICATIONS:  Current Outpatient Medications  Medication Sig Dispense Refill  . aspirin 325 MG tablet Take 162 mg by mouth daily.    . Chromium Picolinate (RA CHROMIUM PICOLINATE) 400 MCG TABS Take 1 tablet by mouth daily.      . cyanocobalamin (,VITAMIN B-12,) 1000 MCG/ML injection Inject 1075mcg every 60days IM (every other month) 1 mL 0  . fish oil-omega-3 fatty acids 1000 MG capsule Take 2 g by mouth 3 (three) times daily. Take six daily    . glucose blood (ONE TOUCH ULTRA TEST) test strip CHECK BLOOD SUGAR ONCE DAILY AND AS DIRECTED. 100 each 1  . Magnesium-Calcium-Folic Acid (MAGNEBIND 409) 400-200-1 MG TABS Take 1 tablet by mouth daily.      . Multiple Vitamins-Minerals (EYE-VITES) TABS Take 1 tablet by mouth 2 (two) times daily.      . pregabalin (LYRICA) 75 MG capsule Take 1 capsule (75 mg total) by mouth 2 (two) times daily. 180 capsule 3  . Saxagliptin-Metformin (KOMBIGLYZE XR) 12-998 MG TB24 Take 1 each by mouth daily. [place]  tablet 0  . sitaGLIPtin (JANUVIA) 100 MG tablet Take 1 tablet (100 mg total) by mouth daily. 90 tablet 3  . telmisartan (MICARDIS) 40 MG tablet TAKE 1 TABLET DAILY 90 tablet 0  . Melatonin 5 MG CAPS Take 1 each by mouth at bedtime.      No current facility-administered medications for this visit.     PHYSICAL EXAMINATION: ECOG PERFORMANCE STATUS: 0 - Asymptomatic  BP 128/73 (BP Location: Right Arm, Patient Position: Sitting)   Pulse (!) 56   Temp 97.8 F (36.6 C) (Tympanic)   Resp 18   Ht 5\' 7"  (1.702 m)   Wt 197 lb 12.8 oz (89.7 kg)   BMI 30.98 kg/m   Filed Weights   07/28/17 1056  Weight: 197 lb 12.8 oz (89.7 kg)    GENERAL: Elderly Caucasian gentleman. Well-nourished well-developed;  Alert, no distress and comfortable.    He walks with a walker. He is Alone. EYES: no pallor or icterus OROPHARYNX: no thrush or ulceration; good dentition  NECK: supple, no masses felt LYMPH:  no palpable lymphadenopathy in the cervical, axillary or inguinal regions LUNGS: clear to auscultation and  No wheeze or crackles HEART/CVS: regular rate & rhythm and no murmurs; 1+ bilateral extremity edema ABDOMEN:abdomen soft, non-tender and normal bowel sounds Musculoskeletal:no cyanosis of digits and no clubbing  PSYCH: alert & oriented x 3 with fluent speech NEURO: no focal motor/sensory deficits SKIN:  no rashes or significant lesions  LABORATORY DATA:  I have reviewed the data as listed    Component Value Date/Time   NA 134 (L) 07/28/2017 1042   NA 134 (L) 12/01/2011 1159   K 4.6 07/28/2017 1042   K 4.7 12/01/2011 1159   CL 99 (L) 07/28/2017 1042   CL 99 12/01/2011 1159   CO2 29 07/28/2017 1042   CO2 29 12/01/2011 1159   GLUCOSE 220 (H) 07/28/2017 1042   GLUCOSE 167 (H) 12/01/2011 1159   BUN 22 (H) 07/28/2017 1042   BUN 12 12/01/2011 1159   CREATININE 0.94 07/28/2017 1042   CREATININE 0.70 12/01/2011 1159   CALCIUM 8.7 (L) 07/28/2017 1042   CALCIUM 8.5 12/01/2011 1159   PROT 7.4 06/27/2017 1028   PROT 7.0 12/01/2011 1159   ALBUMIN 3.6 06/27/2017 1028   ALBUMIN 3.0 (L) 12/01/2011 1159   AST 10 06/27/2017 1028   AST 16 12/01/2011 1159   ALT 8 06/27/2017 1028   ALT 19 12/01/2011 1159   ALKPHOS 50 06/27/2017 1028   ALKPHOS 64 12/01/2011 1159   BILITOT 0.8 06/27/2017 1028   BILITOT 0.8 12/01/2011 1159   GFRNONAA >60 07/28/2017 1042   GFRNONAA >60 12/01/2011 1159   GFRAA >60 07/28/2017 1042   GFRAA >60 12/01/2011 1159    No results found for: SPEP, UPEP  Lab Results  Component Value Date   WBC 6.6 07/28/2017   NEUTROABS 2.6 07/28/2017   HGB 11.3 (L) 07/28/2017   HCT 33.9 (L) 07/28/2017   MCV 92.4 07/28/2017   PLT 186 07/28/2017      Chemistry      Component  Value Date/Time   NA 134 (L) 07/28/2017 1042   NA 134 (L) 12/01/2011 1159   K 4.6 07/28/2017 1042   K 4.7 12/01/2011 1159   CL 99 (L) 07/28/2017 1042   CL 99 12/01/2011 1159   CO2 29 07/28/2017 1042   CO2 29 12/01/2011 1159   BUN 22 (H) 07/28/2017 1042   BUN 12 12/01/2011 1159   CREATININE 0.94 07/28/2017 1042  CREATININE 0.70 12/01/2011 1159      Component Value Date/Time   CALCIUM 8.7 (L) 07/28/2017 1042   CALCIUM 8.5 12/01/2011 1159   ALKPHOS 50 06/27/2017 1028   ALKPHOS 64 12/01/2011 1159   AST 10 06/27/2017 1028   AST 16 12/01/2011 1159   ALT 8 06/27/2017 1028   ALT 19 12/01/2011 1159   BILITOT 0.8 06/27/2017 1028   BILITOT 0.8 12/01/2011 1159     Results for LUCY, WOOLEVER (MRN [id number]) as of 07/28/2017 11:43  Ref. Range 02/28/2015 10:58 06/27/2015 11:12 01/16/2016 09:24 01/16/2016 09:24 07/16/2016 09:53  M Protein SerPl Elph-Mcnc Latest Ref Range: Not Observed g/dL    1.0 (H) 1.0 (H)    ASSESSMENT & PLAN:   Myelomonocytic leukemia (HCC) # CMML [s/p BMBx]- blood counts fairly within normal limits except for mildly low hemoglobin of 11.9.Normal white count and normal platelets. Patient continues to be on surveillance.  # MGUS-no risk-no concern for progression.  Most recent  DEC 2017 M spike 1.0 g/dL. Awaiting labs from today.   # follow up in 12 months/labs- myeloma panel.       Cammie Sickle, MD 07/28/2017 6:33 PM

## 2017-07-28 NOTE — Assessment & Plan Note (Addendum)
#   CMML [s/p BMBx]- blood counts fairly within normal limits except for mildly low hemoglobin of 11.9.Normal white count and normal platelets. Patient continues to be on surveillance.  # MGUS-no risk-no concern for progression.  Most recent  DEC 2017 M spike 1.0 g/dL. Awaiting labs from today.   # follow up in 12 months/labs- myeloma panel.

## 2017-07-29 LAB — KAPPA/LAMBDA LIGHT CHAINS
KAPPA, LAMDA LIGHT CHAIN RATIO: 1.35 (ref 0.26–1.65)
Kappa free light chain: 47.9 mg/L — ABNORMAL HIGH (ref 3.3–19.4)
Lambda free light chains: 35.5 mg/L — ABNORMAL HIGH (ref 5.7–26.3)

## 2017-08-01 LAB — MULTIPLE MYELOMA PANEL, SERUM
ALBUMIN/GLOB SERPL: 0.9 (ref 0.7–1.7)
ALPHA 1: 0.2 g/dL (ref 0.0–0.4)
Albumin SerPl Elph-Mcnc: 3.3 g/dL (ref 2.9–4.4)
Alpha2 Glob SerPl Elph-Mcnc: 0.6 g/dL (ref 0.4–1.0)
B-Globulin SerPl Elph-Mcnc: 0.9 g/dL (ref 0.7–1.3)
GAMMA GLOB SERPL ELPH-MCNC: 2.2 g/dL — AB (ref 0.4–1.8)
GLOBULIN, TOTAL: 3.9 g/dL (ref 2.2–3.9)
IGA: 357 mg/dL (ref 61–437)
IGM (IMMUNOGLOBULIN M), SRM: 132 mg/dL (ref 15–143)
IgG (Immunoglobin G), Serum: 2063 mg/dL — ABNORMAL HIGH (ref 700–1600)
M Protein SerPl Elph-Mcnc: 1.2 g/dL — ABNORMAL HIGH
Total Protein ELP: 7.2 g/dL (ref 6.0–8.5)

## 2017-08-08 DIAGNOSIS — H401132 Primary open-angle glaucoma, bilateral, moderate stage: Secondary | ICD-10-CM | POA: Diagnosis not present

## 2017-08-12 ENCOUNTER — Other Ambulatory Visit: Payer: Self-pay

## 2017-08-12 MED ORDER — TELMISARTAN 40 MG PO TABS: ORAL_TABLET | ORAL | 2 refills | Status: DC

## 2017-09-02 ENCOUNTER — Other Ambulatory Visit: Payer: Self-pay

## 2017-09-02 MED ORDER — SAXAGLIPTIN-METFORMIN ER 5-1000 MG PO TB24: 1.0000 | ORAL_TABLET | Freq: Every day | ORAL | 1 refills | Status: DC

## 2017-09-12 ENCOUNTER — Telehealth: Payer: Self-pay | Admitting: Family Medicine

## 2017-09-12 ENCOUNTER — Other Ambulatory Visit: Payer: Self-pay

## 2017-09-12 MED ORDER — SITAGLIPTIN PHOSPHATE 100 MG PO TABS: 100.0000 mg | ORAL_TABLET | Freq: Every day | ORAL | 3 refills | Status: DC

## 2017-09-12 NOTE — Telephone Encounter (Signed)
This has been completed/thx dmf 

## 2017-09-12 NOTE — Telephone Encounter (Signed)
Copied from [place] [id number]. Topic: Quick Communication - Rx Refill/Question >> Sep 12, 2017 11:49 AM Burnis Medin, NT wrote: Medication: sitaGLIPtin (JANUVIA) 100 MG tablet   Has the patient contacted their pharmacy? Yes   (Agent: If no, request that the patient contact the pharmacy for the refill.)   Preferred Pharmacy (with phone number or street name): [place], [place] [phone] (Phone) [phone] (Fax)     Agent: Please be advised that RX refills may take up to 3 business days. We ask that you follow-up with your pharmacy.

## 2017-09-14 DIAGNOSIS — B351 Tinea unguium: Secondary | ICD-10-CM | POA: Diagnosis not present

## 2017-09-14 DIAGNOSIS — E1142 Type 2 diabetes mellitus with diabetic polyneuropathy: Secondary | ICD-10-CM | POA: Diagnosis not present

## 2017-10-19 DIAGNOSIS — G4733 Obstructive sleep apnea (adult) (pediatric): Secondary | ICD-10-CM | POA: Diagnosis not present

## 2017-12-19 DIAGNOSIS — E1142 Type 2 diabetes mellitus with diabetic polyneuropathy: Secondary | ICD-10-CM | POA: Diagnosis not present

## 2017-12-19 DIAGNOSIS — B351 Tinea unguium: Secondary | ICD-10-CM | POA: Diagnosis not present

## 2017-12-22 DIAGNOSIS — G4733 Obstructive sleep apnea (adult) (pediatric): Secondary | ICD-10-CM | POA: Diagnosis not present

## 2017-12-26 ENCOUNTER — Ambulatory Visit: Payer: Medicare Other | Admitting: Family Medicine

## 2017-12-26 ENCOUNTER — Other Ambulatory Visit: Payer: Self-pay | Admitting: Family Medicine

## 2017-12-26 ENCOUNTER — Encounter: Payer: Self-pay | Admitting: Family Medicine

## 2017-12-26 ENCOUNTER — Telehealth: Payer: Self-pay

## 2017-12-26 VITALS — BP 130/54 | HR 71 | Temp 98.0°F | Ht 67.0 in | Wt 194.2 lb

## 2017-12-26 DIAGNOSIS — R8290 Unspecified abnormal findings in urine: Secondary | ICD-10-CM

## 2017-12-26 DIAGNOSIS — E538 Deficiency of other specified B group vitamins: Secondary | ICD-10-CM | POA: Diagnosis not present

## 2017-12-26 DIAGNOSIS — I10 Essential (primary) hypertension: Secondary | ICD-10-CM

## 2017-12-26 DIAGNOSIS — E118 Type 2 diabetes mellitus with unspecified complications: Secondary | ICD-10-CM | POA: Diagnosis not present

## 2017-12-26 LAB — POCT URINALYSIS DIPSTICK
BILIRUBIN UA: 1
Glucose, UA: NEGATIVE
Ketones, UA: 15
PH UA: 5 (ref 5.0–8.0)
PROTEIN UA: POSITIVE — AB
Spec Grav, UA: 1.025 (ref 1.010–1.025)
UROBILINOGEN UA: 0.2 U/dL

## 2017-12-26 LAB — POCT UA - MICROALBUMIN
Creatinine, POC: 10 mg/dL
MICROALBUMIN (UR) POC: 150 mg/L

## 2017-12-26 LAB — POCT GLYCOSYLATED HEMOGLOBIN (HGB A1C): Hemoglobin A1C: 8.6

## 2017-12-26 MED ORDER — CEPHALEXIN 500 MG PO CAPS: 500.0000 mg | ORAL_CAPSULE | Freq: Two times a day (BID) | ORAL | 0 refills | Status: AC

## 2017-12-26 MED ORDER — CYANOCOBALAMIN 1000 MCG/ML IJ SOLN
1000.0000 ug | Freq: Once | INTRAMUSCULAR | Status: AC
  Administered 2017-12-26: 1000 ug via INTRAMUSCULAR

## 2017-12-26 NOTE — Patient Instructions (Signed)
Great to see you.  Let's work on diet as we discussed.  Please come see me in 3 months.

## 2017-12-26 NOTE — Progress Notes (Signed)
Subjective:   Patient ID: Nathan Cunningham, male    DOB: 1925-03-18, 84 y.o.   MRN: [id number]  Nathan Cunningham is a pleasant 84 y.o. year old male who presents to clinic today with Diabetes (Patient is here today for a 56-month-F/U on DM.  He has been compliant with his medications.  States BS is high end of normal being "in the 200's and I don't do it as often as I need to.") and vitamin b12 def (Patient is also needing to get his normal B-12 injection today.)  on 12/26/2017  HPI:  Here with his wife, Nathan Cunningham. Doing well. Having eye lid surgery.  Just turned 84!  Vit b12 deficiency-  Due for IM vit B12 here today.  Lab Results  Component Value Date   [id number] 829 06/27/2017    HTN-doing well with current rx. Denies dizziness, CP, HA or SOB.  Lab Results  Component Value Date   CREATININE 0.94 07/28/2017   BP Readings from Last 3 Encounters:  12/26/17 (!) 130/54  07/28/17 128/73  06/27/17 108/64    DM- deteriorated. Taking glipizide and Kombiglyze, and Januvia. No noticeable side effects.    Denies any symptoms of hypoglycemia.   His wife admits he has been eating a lot of sweets- chocolate every day at lunch, cinnamon buns for breakfast and a bowl of ice cream nightly before bed. Lab Results  Component Value Date   HGBA1C 7.6 (H) 06/27/2017    Arthritis pain much improved with scheduled tylenol twice daily.  Also taking Lyrica which has helped with his neuropathy.  Lab Results  Component Value Date   HGBA1C 7.6 (H) 06/27/2017   Lab Results  Component Value Date   CHOL 134 07/06/2016   HDL 35.40 (L) 07/06/2016   LDLCALC 71 07/06/2016   LDLDIRECT 81.0 02/04/2015   TRIG 138.0 07/06/2016   CHOLHDL 4 07/06/2016   Lab Results  Component Value Date   NA 134 (L) 07/28/2017   K 4.6 07/28/2017   CL 99 (L) 07/28/2017   CO2 29 07/28/2017   Current Outpatient Medications on File Prior to Visit  Medication Sig Dispense Refill  . aspirin 325 MG tablet Take  162 mg by mouth daily.    . Chromium Picolinate (RA CHROMIUM PICOLINATE) 400 MCG TABS Take 1 tablet by mouth daily.      . cyanocobalamin (,VITAMIN B-12,) 1000 MCG/ML injection Inject 1074mcg every 60days IM (every other month) 1 mL 0  . fish oil-omega-3 fatty acids 1000 MG capsule Take 2 g by mouth 3 (three) times daily. Take six daily    . glucose blood (ONE TOUCH ULTRA TEST) test strip CHECK BLOOD SUGAR ONCE DAILY AND AS DIRECTED. 100 each 1  . Magnesium-Calcium-Folic Acid (MAGNEBIND 562) 400-200-1 MG TABS Take 1 tablet by mouth daily.      . Melatonin 5 MG CAPS Take 1 each by mouth at bedtime.     . Multiple Vitamins-Minerals (EYE-VITES) TABS Take 1 tablet by mouth 2 (two) times daily.      . pregabalin (LYRICA) 75 MG capsule Take 1 capsule (75 mg total) by mouth 2 (two) times daily. 180 capsule 3  . Saxagliptin-Metformin (KOMBIGLYZE XR) 12-998 MG TB24 Take 1 each by mouth daily. 90 tablet 1  . sitaGLIPtin (JANUVIA) 100 MG tablet Take 1 tablet (100 mg total) by mouth daily. 90 tablet 3  . telmisartan (MICARDIS) 40 MG tablet TAKE 1 TABLET DAILY 90 tablet 2   No current facility-administered medications on  file prior to visit.     No Known Allergies  Past Medical History:  Diagnosis Date  . Adenomatous polyp of colon 03/2003  . Anemia   . Constipation   . Diverticulosis   . Dizziness   . ETOH abuse    binge drinking  . Hemorrhoids   . Hyperlipidemia   . Hypertension   . Macular degeneration    Right eye  . OSA on CPAP   . Type II diabetes mellitus ([place])     Past Surgical History:  Procedure Laterality Date  . ANTERIOR CERVICAL DECOMP/DISCECTOMY FUSION    . APPENDECTOMY  1935  . BACK SURGERY    . CATARACT EXTRACTION Left   . INGUINAL HERNIA REPAIR  1935  . TONSILLECTOMY      Family History  Problem Relation Age of Onset  . Heart disease Father   . Stroke Father 91    Social History   Socioeconomic History  . Marital status: Divorced    Spouse name: Not on file    . Number of children: 2  . Years of education: Not on file  . Highest education level: Not on file  Occupational History  . Occupation: retired  Scientific laboratory technician  . Financial resource strain: Not on file  . Food insecurity:    Worry: Not on file    Inability: Not on file  . Transportation needs:    Medical: Not on file    Non-medical: Not on file  Tobacco Use  . Smoking status: Never Smoker  . Smokeless tobacco: Never Used  Substance and Sexual Activity  . Alcohol use: Yes    Alcohol/week: 12.6 oz    Types: 14 Standard drinks or equivalent, 7 Shots of liquor per week    Comment: "1 mixed drink/day"  . Drug use: No  . Sexual activity: Never  Lifestyle  . Physical activity:    Days per week: Not on file    Minutes per session: Not on file  . Stress: Not on file  Relationships  . Social connections:    Talks on phone: Not on file    Gets together: Not on file    Attends religious service: Not on file    Active member of club or organization: Not on file    Attends meetings of clubs or organizations: Not on file    Relationship status: Not on file  . Intimate partner violence:    Fear of current or ex partner: Not on file    Emotionally abused: Not on file    Physically abused: Not on file    Forced sexual activity: Not on file  Other Topics Concern  . Not on file  Social History Narrative   Retired from [employer]   Goes to [place] sneakers 3 times a week.   Has a girlfriend.   Does not have a living will yet.   Desires CPR, does not prolonged life support if futile.         The PMH, PSH, Social History, Family History, Medications, and allergies have been reviewed in [facility], and have been updated if relevant.   Review of Systems  Constitutional: Negative for fatigue, fever and unexpected weight change.  HENT: Negative.   Eyes: Negative.   Respiratory: Negative.   Cardiovascular: Negative.   Gastrointestinal: Negative.   Endocrine: Negative.   Genitourinary:  Negative.   Musculoskeletal: Negative.   Allergic/Immunologic: Negative.   Neurological: Negative.   Hematological: Negative.   Psychiatric/Behavioral:  Negative.   All other systems reviewed and are negative.      Objective:    BP (!) 130/54 (BP Location: Left Arm, Patient Position: Sitting, Cuff Size: Normal)   Pulse 71   Temp 98 F (36.7 C) (Oral)   Ht 5\' 7"  (1.702 m)   Wt 194 lb 3.2 oz (88.1 kg)   SpO2 92% Comment: When walked from Lab to room O2 went down to 89  BMI 30.42 kg/m    Physical Exam  Constitutional: He is oriented to person, place, and time. He appears well-developed and well-nourished. No distress.  HENT:  Head: Normocephalic and atraumatic.  Eyes: Conjunctivae are normal.  Neck: Normal range of motion. Neck supple.  Cardiovascular: Normal rate and regular rhythm.  Pulmonary/Chest: Effort normal and breath sounds normal.  Musculoskeletal: Normal range of motion. He exhibits no edema.  Neurological: He is alert and oriented to person, place, and time. No cranial nerve deficit.  Skin: Skin is dry. He is not diaphoretic.  Psychiatric: He has a normal mood and affect. His behavior is normal. Judgment and thought content normal.  Nursing note and vitals reviewed.         Assessment & Plan:   Vitamin B12 deficiency  Diabetes mellitus with complication ([place]) - Plan: POCT HgB A1C  B12 deficiency - Plan: cyanocobalamin ((VITAMIN B-12)) injection 1,000 mcg  Essential hypertension No follow-ups on file.

## 2017-12-26 NOTE — Assessment & Plan Note (Signed)
Deteriorated. >25 minutes spent in face to face time with patient, >50% spent in counselling or coordination of care He really wants to work on diet which I feel is reasonable given his age instead of introducing another rx. Given eat right diet handout and discussed with pt and and his wife, Nathan Cunningham. Follow up in 3 months.  Will repeat a1c at that time. The patient indicates understanding of these issues and agrees with the plan.

## 2017-12-26 NOTE — Assessment & Plan Note (Signed)
IM B12 given to pt today.

## 2017-12-26 NOTE — Assessment & Plan Note (Signed)
Normotensive.  No changes made today.

## 2017-12-26 NOTE — Telephone Encounter (Signed)
I dipped pt urine for POCT Micral/was foul smelling and very dark/dipstick positive for UTI/ok to send for C&S per TA/thx dmf

## 2017-12-29 LAB — URINE CULTURE
MICRO NUMBER:: 90610508
SPECIMEN QUALITY: ADEQUATE

## 2018-01-23 DIAGNOSIS — H401132 Primary open-angle glaucoma, bilateral, moderate stage: Secondary | ICD-10-CM | POA: Diagnosis not present

## 2018-01-31 ENCOUNTER — Other Ambulatory Visit: Payer: Self-pay | Admitting: Family Medicine

## 2018-03-15 DIAGNOSIS — G4733 Obstructive sleep apnea (adult) (pediatric): Secondary | ICD-10-CM | POA: Diagnosis not present

## 2018-03-21 DIAGNOSIS — L851 Acquired keratosis [keratoderma] palmaris et plantaris: Secondary | ICD-10-CM | POA: Diagnosis not present

## 2018-03-21 DIAGNOSIS — E1142 Type 2 diabetes mellitus with diabetic polyneuropathy: Secondary | ICD-10-CM | POA: Diagnosis not present

## 2018-03-21 DIAGNOSIS — B351 Tinea unguium: Secondary | ICD-10-CM | POA: Diagnosis not present

## 2018-03-24 ENCOUNTER — Other Ambulatory Visit: Payer: Self-pay | Admitting: Family Medicine

## 2018-03-28 ENCOUNTER — Ambulatory Visit: Payer: Medicare Other | Admitting: Family Medicine

## 2018-03-29 DIAGNOSIS — H57813 Brow ptosis, bilateral: Secondary | ICD-10-CM | POA: Diagnosis not present

## 2018-03-29 DIAGNOSIS — H02532 Eyelid retraction right lower eyelid: Secondary | ICD-10-CM | POA: Diagnosis not present

## 2018-03-29 DIAGNOSIS — H02423 Myogenic ptosis of bilateral eyelids: Secondary | ICD-10-CM | POA: Diagnosis not present

## 2018-03-29 DIAGNOSIS — H02135 Senile ectropion of left lower eyelid: Secondary | ICD-10-CM | POA: Diagnosis not present

## 2018-04-04 ENCOUNTER — Encounter: Payer: Self-pay | Admitting: Family Medicine

## 2018-04-04 ENCOUNTER — Ambulatory Visit: Payer: Medicare Other | Admitting: Family Medicine

## 2018-04-04 VITALS — BP 118/56 | HR 63 | Temp 97.9°F | Ht 67.0 in | Wt 188.0 lb

## 2018-04-04 DIAGNOSIS — I10 Essential (primary) hypertension: Secondary | ICD-10-CM

## 2018-04-04 DIAGNOSIS — E538 Deficiency of other specified B group vitamins: Secondary | ICD-10-CM

## 2018-04-04 DIAGNOSIS — E1140 Type 2 diabetes mellitus with diabetic neuropathy, unspecified: Secondary | ICD-10-CM | POA: Diagnosis not present

## 2018-04-04 DIAGNOSIS — E118 Type 2 diabetes mellitus with unspecified complications: Secondary | ICD-10-CM | POA: Diagnosis not present

## 2018-04-04 LAB — COMPREHENSIVE METABOLIC PANEL
ALT: 9 U/L (ref 0–53)
AST: 9 U/L (ref 0–37)
Albumin: 3.7 g/dL (ref 3.5–5.2)
Alkaline Phosphatase: 44 U/L (ref 39–117)
BILIRUBIN TOTAL: 0.9 mg/dL (ref 0.2–1.2)
BUN: 18 mg/dL (ref 6–23)
CALCIUM: 9.5 mg/dL (ref 8.4–10.5)
CO2: 31 meq/L (ref 19–32)
CREATININE: 0.95 mg/dL (ref 0.40–1.50)
Chloride: 104 mEq/L (ref 96–112)
GFR: 79.02 mL/min (ref >60.00)
GLUCOSE: 159 mg/dL — AB (ref 70–99)
Potassium: 4.9 mEq/L (ref 3.5–5.1)
SODIUM: 138 meq/L (ref 135–145)
Total Protein: 7.5 g/dL (ref 6.0–8.3)

## 2018-04-04 LAB — LIPID PANEL
CHOLESTEROL: 114 mg/dL (ref 0–200)
HDL: 34.5 mg/dL — AB (ref >39.00)
LDL Cholesterol: 51 mg/dL (ref 0–99)
NonHDL: 79.9
TRIGLYCERIDES: 144 mg/dL (ref 0.0–149.0)
Total CHOL/HDL Ratio: 3
VLDL: 28.8 mg/dL (ref 0.0–40.0)

## 2018-04-04 LAB — HEMOGLOBIN A1C: Hgb A1c MFr Bld: 7.7 % — ABNORMAL HIGH (ref 4.6–6.5)

## 2018-04-04 MED ORDER — CYANOCOBALAMIN 1000 MCG/ML IJ SOLN
1000.0000 ug | Freq: Once | INTRAMUSCULAR | Status: AC
  Administered 2018-04-04: 1000 ug via INTRAMUSCULAR

## 2018-04-04 NOTE — Assessment & Plan Note (Signed)
Has been working on diet.  Will recheck a1c today- I anticipate it to be much better. Congratulated him on his diet change and weight loss. No changes made to rxs today. Follow up in 3 months.

## 2018-04-04 NOTE — Assessment & Plan Note (Signed)
Continue current dose of Lyrica.

## 2018-04-04 NOTE — Patient Instructions (Signed)
Great to see you. I will call you with your lab results from today and you can view them online.    Please come see me in 3 months.

## 2018-04-04 NOTE — Progress Notes (Signed)
Subjective:   Patient ID: Nathan Cunningham, male    DOB: Jan 02, 1925, 84 y.o.   MRN: [id number]  Nathan Cunningham is a pleasant 84 y.o. year old male who presents to clinic today with Follow-up (Doing well-denies changes in his history)  on 04/04/2018  HPI:  Here with his wife, Nathan Cunningham. Doing well.   Vit b12 deficiency-  Due for IM vit B12 here today.  Lab Results  Component Value Date   IONGEXBM84 132 06/27/2017    HTN-well controlled on current rxs.  Denies HA, burred  Lab Results  Component Value Date   CREATININE 0.94 07/28/2017   BP Readings from Last 3 Encounters:  04/04/18 (!) 118/56  12/26/17 (!) 130/54  07/28/17 128/73    DM- when I last saw him in 12/2017, his a1c had deteriorated. Compliant with glipizide and Kombiglyze, and Januvia. No noticeable side effects.   He preferred to work on diet with his wife and to follow up with me here today.  He has cut out a lot of sweets- no longer has a sweet roll for breakfast every morning.  Stopped eating cookies.  Still has ice cream but without chocolate syrup.   Lab Results  Component Value Date   HGBA1C 8.6 12/26/2017   Wt Readings from Last 3 Encounters:  04/04/18 188 lb (85.3 kg)  12/26/17 194 lb 3.2 oz (88.1 kg)  07/28/17 197 lb 12.8 oz (89.7 kg)    Arthritis pain much improved with scheduled tylenol twice daily.  Also taking Lyrica which has helped with his neuropathy.  Lab Results  Component Value Date   HGBA1C 8.6 12/26/2017   Lab Results  Component Value Date   CHOL 134 07/06/2016   HDL 35.40 (L) 07/06/2016   LDLCALC 71 07/06/2016   LDLDIRECT 81.0 02/04/2015   TRIG 138.0 07/06/2016   CHOLHDL 4 07/06/2016   Lab Results  Component Value Date   NA 134 (L) 07/28/2017   K 4.6 07/28/2017   CL 99 (L) 07/28/2017   CO2 29 07/28/2017   Current Outpatient Medications on File Prior to Visit  Medication Sig Dispense Refill  . aspirin 325 MG tablet Take 162 mg by mouth daily.    . Chromium  Picolinate (RA CHROMIUM PICOLINATE) 400 MCG TABS Take 1 tablet by mouth daily.      . cyanocobalamin (,VITAMIN B-12,) 1000 MCG/ML injection Inject 1063mcg every 60days IM (every other month) 1 mL 0  . fish oil-omega-3 fatty acids 1000 MG capsule Take 2 g by mouth 3 (three) times daily. Take six daily    . glucose blood (ONE TOUCH ULTRA TEST) test strip CHECK BLOOD SUGAR ONCE DAILY AND AS DIRECTED. 100 each 1  . KOMBIGLYZE XR 12-998 MG TB24 TAKE 1 TABLET BY MOUTH DAILY 90 tablet 0  . LYRICA 75 MG capsule TAKE 1 CAPSULE BY MOUTH TWICE DAILY 180 capsule 0  . Magnesium-Calcium-Folic Acid (MAGNEBIND 440) 400-200-1 MG TABS Take 1 tablet by mouth daily.      . Multiple Vitamins-Minerals (EYE-VITES) TABS Take 1 tablet by mouth 2 (two) times daily.      . sitaGLIPtin (JANUVIA) 100 MG tablet Take 1 tablet (100 mg total) by mouth daily. 90 tablet 3  . telmisartan (MICARDIS) 40 MG tablet TAKE 1 TABLET DAILY 90 tablet 2   No current facility-administered medications on file prior to visit.     No Known Allergies  Past Medical History:  Diagnosis Date  . Adenomatous polyp of colon 03/2003  .  Anemia   . Constipation   . Diverticulosis   . Dizziness   . ETOH abuse    binge drinking  . Hemorrhoids   . Hyperlipidemia   . Hypertension   . Macular degeneration    Right eye  . OSA on CPAP   . Type II diabetes mellitus ([place])     Past Surgical History:  Procedure Laterality Date  . ANTERIOR CERVICAL DECOMP/DISCECTOMY FUSION    . APPENDECTOMY  1935  . BACK SURGERY    . CATARACT EXTRACTION Left   . INGUINAL HERNIA REPAIR  1935  . TONSILLECTOMY      Family History  Problem Relation Age of Onset  . Heart disease Father   . Stroke Father 11    Social History   Socioeconomic History  . Marital status: Divorced    Spouse name: Not on file  . Number of children: 2  . Years of education: Not on file  . Highest education level: Not on file  Occupational History  . Occupation: retired    Scientific laboratory technician  . Financial resource strain: Not on file  . Food insecurity:    Worry: Not on file    Inability: Not on file  . Transportation needs:    Medical: Not on file    Non-medical: Not on file  Tobacco Use  . Smoking status: Never Smoker  . Smokeless tobacco: Never Used  Substance and Sexual Activity  . Alcohol use: Yes    Alcohol/week: 21.0 standard drinks    Types: 14 Standard drinks or equivalent, 7 Shots of liquor per week    Comment: "1 mixed drink/day"  . Drug use: No  . Sexual activity: Never  Lifestyle  . Physical activity:    Days per week: Not on file    Minutes per session: Not on file  . Stress: Not on file  Relationships  . Social connections:    Talks on phone: Not on file    Gets together: Not on file    Attends religious service: Not on file    Active member of club or organization: Not on file    Attends meetings of clubs or organizations: Not on file    Relationship status: Not on file  . Intimate partner violence:    Fear of current or ex partner: Not on file    Emotionally abused: Not on file    Physically abused: Not on file    Forced sexual activity: Not on file  Other Topics Concern  . Not on file  Social History Narrative   Retired from [employer]   Goes to [employer] sneakers 3 times a week.   Has a girlfriend.   Does not have a living will yet.   Desires CPR, does not prolonged life support if futile.         The PMH, PSH, Social History, Family History, Medications, and allergies have been reviewed in [facility], and have been updated if relevant.   Review of Systems  Constitutional: Negative for fatigue, fever and unexpected weight change.  HENT: Negative.   Eyes: Negative.   Respiratory: Negative.   Cardiovascular: Negative.   Gastrointestinal: Negative.   Endocrine: Negative.   Genitourinary: Negative.   Musculoskeletal: Negative.   Allergic/Immunologic: Negative.   Neurological: Negative.   Hematological: Negative.    Psychiatric/Behavioral: Negative.   All other systems reviewed and are negative.      Objective:    BP (!) 118/56 (BP Location: Left Arm,  Patient Position: Sitting, Cuff Size: Normal)   Pulse 63   Temp 97.9 F (36.6 C) (Oral)   Ht 5\' 7"  (1.702 m)   Wt 188 lb (85.3 kg)   SpO2 92%   BMI 29.44 kg/m   Wt Readings from Last 3 Encounters:  04/04/18 188 lb (85.3 kg)  12/26/17 194 lb 3.2 oz (88.1 kg)  07/28/17 197 lb 12.8 oz (89.7 kg)     Physical Exam  Constitutional: He is oriented to person, place, and time. He appears well-developed and well-nourished. No distress.  HENT:  Head: Normocephalic and atraumatic.  Eyes: Conjunctivae are normal.  Neck: Normal range of motion. Neck supple.  Cardiovascular: Normal rate and regular rhythm.  Pulmonary/Chest: Effort normal and breath sounds normal.  Musculoskeletal: Normal range of motion. He exhibits no edema.  Neurological: He is alert and oriented to person, place, and time. No cranial nerve deficit.  Skin: Skin is dry. He is not diaphoretic.  Psychiatric: He has a normal mood and affect. His behavior is normal. Judgment and thought content normal.  Nursing note and vitals reviewed.         Assessment & Plan:   Diabetes mellitus with complication ([place]) - Plan: Hemoglobin A1c, Comprehensive metabolic panel, Lipid panel  B12 deficiency - Plan: cyanocobalamin ((VITAMIN B-12)) injection 1,000 mcg  Essential hypertension  Type 2 diabetes mellitus with diabetic neuropathy, without long-term current use of insulin (HCC) No follow-ups on file.

## 2018-04-04 NOTE — Assessment & Plan Note (Signed)
IM b12 given today. 

## 2018-04-04 NOTE — Assessment & Plan Note (Signed)
Well controlled. No changes made today. 

## 2018-05-01 ENCOUNTER — Other Ambulatory Visit: Payer: Self-pay | Admitting: Family Medicine

## 2018-05-01 NOTE — Telephone Encounter (Signed)
TA-Pt has OV scheduled as you advised for November/thx dmf

## 2018-05-05 DIAGNOSIS — H401132 Primary open-angle glaucoma, bilateral, moderate stage: Secondary | ICD-10-CM | POA: Diagnosis not present

## 2018-05-08 DIAGNOSIS — L570 Actinic keratosis: Secondary | ICD-10-CM | POA: Diagnosis not present

## 2018-05-08 DIAGNOSIS — D485 Neoplasm of uncertain behavior of skin: Secondary | ICD-10-CM | POA: Diagnosis not present

## 2018-05-08 DIAGNOSIS — H02532 Eyelid retraction right lower eyelid: Secondary | ICD-10-CM | POA: Diagnosis not present

## 2018-05-25 ENCOUNTER — Encounter: Payer: Self-pay | Admitting: Family Medicine

## 2018-05-25 ENCOUNTER — Ambulatory Visit (INDEPENDENT_AMBULATORY_CARE_PROVIDER_SITE_OTHER): Payer: Medicare Other

## 2018-05-25 DIAGNOSIS — E538 Deficiency of other specified B group vitamins: Secondary | ICD-10-CM

## 2018-05-25 DIAGNOSIS — Z23 Encounter for immunization: Secondary | ICD-10-CM

## 2018-05-25 MED ORDER — CYANOCOBALAMIN 1000 MCG/ML IJ SOLN
1000.0000 ug | Freq: Once | INTRAMUSCULAR | Status: AC
  Administered 2018-05-25: 1000 ug via INTRAMUSCULAR

## 2018-05-25 NOTE — Progress Notes (Signed)
Pt presented today with his wife for influenza vaccination. IM injection given in R deltoid. Pt tolerated well. No observed S/S prior to leaving office. Vaccination information given. B12 injection given in L deltoid per order of Dr [name]. Pt tolerated well. IM injection given in R deltoid. No observed S/S prior to leaving office

## 2018-05-29 ENCOUNTER — Encounter: Payer: Self-pay | Admitting: Behavioral Health

## 2018-06-01 DIAGNOSIS — H401132 Primary open-angle glaucoma, bilateral, moderate stage: Secondary | ICD-10-CM | POA: Diagnosis not present

## 2018-06-15 NOTE — Progress Notes (Signed)
Agree with treatment below

## 2018-06-19 DIAGNOSIS — G4733 Obstructive sleep apnea (adult) (pediatric): Secondary | ICD-10-CM | POA: Diagnosis not present

## 2018-06-26 DIAGNOSIS — H04562 Stenosis of left lacrimal punctum: Secondary | ICD-10-CM | POA: Diagnosis not present

## 2018-06-26 DIAGNOSIS — H02532 Eyelid retraction right lower eyelid: Secondary | ICD-10-CM | POA: Diagnosis not present

## 2018-06-26 DIAGNOSIS — L570 Actinic keratosis: Secondary | ICD-10-CM | POA: Diagnosis not present

## 2018-06-27 ENCOUNTER — Ambulatory Visit: Payer: Medicare Other | Admitting: Family Medicine

## 2018-06-28 ENCOUNTER — Ambulatory Visit: Payer: Medicare Other | Admitting: Family Medicine

## 2018-07-03 ENCOUNTER — Telehealth: Payer: Self-pay

## 2018-07-03 NOTE — Telephone Encounter (Signed)
Marita Kansas from [facility] called and states patient needs medication list to be faxed to his dentist office. Faxed med list to [phone] as requested.

## 2018-07-04 ENCOUNTER — Telehealth: Payer: Self-pay | Admitting: Family Medicine

## 2018-07-04 NOTE — Telephone Encounter (Signed)
Noted. Is there some place we can place these results in the chart?

## 2018-07-04 NOTE — Telephone Encounter (Unsigned)
Copied from [place] [id number]. Topic: Quick Communication - See Telephone Encounter >> Jul 04, 2018  9:25 AM Valla Leaver wrote: CRM for notification. See Telephone encounter for: 07/04/18. Yvetta Coder, NP with [facility] did an in home adjustment this morning with the patient and the P-A-D testing results were "right leg mild" and  "left leg moderate". No need to call back.

## 2018-07-04 NOTE — Telephone Encounter (Signed)
This isn't a [facility] patient. Thanks!

## 2018-07-06 ENCOUNTER — Other Ambulatory Visit: Payer: Self-pay | Admitting: Family Medicine

## 2018-07-10 DIAGNOSIS — B351 Tinea unguium: Secondary | ICD-10-CM | POA: Diagnosis not present

## 2018-07-10 DIAGNOSIS — E1142 Type 2 diabetes mellitus with diabetic polyneuropathy: Secondary | ICD-10-CM | POA: Diagnosis not present

## 2018-07-11 ENCOUNTER — Encounter: Payer: Self-pay | Admitting: Family Medicine

## 2018-07-11 ENCOUNTER — Ambulatory Visit: Payer: Medicare Other | Admitting: Family Medicine

## 2018-07-11 VITALS — BP 112/68 | HR 62 | Temp 98.7°F | Ht 67.0 in | Wt 187.0 lb

## 2018-07-11 DIAGNOSIS — M533 Sacrococcygeal disorders, not elsewhere classified: Secondary | ICD-10-CM | POA: Insufficient documentation

## 2018-07-11 DIAGNOSIS — E538 Deficiency of other specified B group vitamins: Secondary | ICD-10-CM | POA: Diagnosis not present

## 2018-07-11 DIAGNOSIS — E118 Type 2 diabetes mellitus with unspecified complications: Secondary | ICD-10-CM

## 2018-07-11 DIAGNOSIS — G4700 Insomnia, unspecified: Secondary | ICD-10-CM | POA: Insufficient documentation

## 2018-07-11 DIAGNOSIS — I10 Essential (primary) hypertension: Secondary | ICD-10-CM

## 2018-07-11 LAB — POCT GLYCOSYLATED HEMOGLOBIN (HGB A1C): HEMOGLOBIN A1C: 6.8 % — AB (ref 4.0–5.6)

## 2018-07-11 MED ORDER — CYANOCOBALAMIN 1000 MCG/ML IJ SOLN
1000.0000 ug | Freq: Once | INTRAMUSCULAR | Status: AC
  Administered 2018-07-11: 1000 ug via INTRAMUSCULAR

## 2018-07-11 NOTE — Assessment & Plan Note (Signed)
He is at risk for another sacral wound.  Advised alternating pressure - ie moving pillows under left and right hip every few hours. Call or return to clinic prn if these symptoms worsen or fail to improve as anticipated. The patient indicates understanding of these issues and agrees with the plan.

## 2018-07-11 NOTE — Patient Instructions (Signed)
Great to see you!  Happy birthday and happy holidays!

## 2018-07-11 NOTE — Progress Notes (Signed)
Subjective:   Patient ID: Nathan Cunningham, male    DOB: September 26, 1924, 84 y.o.   MRN: [id number]  Nathan Cunningham is a pleasant 84 y.o. year old male who presents to clinic today with Follow-up (doing well, has not been checking BS daily. Has a open sore on his coccyx-present for a couple of days. )  on 07/11/2018  HPI:  Here again with his wife, Nathan Cunningham. Doing well.  He did have a sacral wound several months ago that healed.  Now he is complaining of pain in the same area.  He is not sure if he has another pressure sore. He does sit in his recliner for long periods of time.  Vit b12 deficiency-  Due for IM vit B12 here today.  Lab Results  Component Value Date   [id number] 627 06/27/2017    HTN-well controlled on current rxs.  Denies HA, burred  Lab Results  Component Value Date   CREATININE 0.95 04/04/2018   BP Readings from Last 3 Encounters:  07/11/18 112/68  04/04/18 (!) 118/56  12/26/17 (!) 130/54    DM- when I last saw him on 04/04/18, his a1c had improved (down to 7.7 from 8.6). Compliant with glipizide and Kombiglyze, and Januvia. No noticeable side effects.     He has cut out a lot of sweets- no longer has a sweet roll for breakfast every morning.  Stopped eating cookies.  Still has ice cream but without chocolate syrup.    He is not checking FSBS regularly.  Denies any symptoms of hypoglycemia. Lab Results  Component Value Date   HGBA1C 6.8 (A) 07/11/2018   Wt Readings from Last 3 Encounters:  07/11/18 187 lb (84.8 kg)  04/04/18 188 lb (85.3 kg)  12/26/17 194 lb 3.2 oz (88.1 kg)    Arthritis pain much improved with scheduled tylenol twice daily.  Also taking Lyrica which has helped with his neuropathy.  Lab Results  Component Value Date   HGBA1C 6.8 (A) 07/11/2018   Lab Results  Component Value Date   CHOL 114 04/04/2018   HDL 34.50 (L) 04/04/2018   LDLCALC 51 04/04/2018   LDLDIRECT 81.0 02/04/2015   TRIG 144.0 04/04/2018   CHOLHDL 3  04/04/2018   Lab Results  Component Value Date   NA 138 04/04/2018   K 4.9 04/04/2018   CL 104 04/04/2018   CO2 31 04/04/2018   Current Outpatient Medications on File Prior to Visit  Medication Sig Dispense Refill  . aspirin 325 MG tablet Take 162 mg by mouth daily.    . Chromium Picolinate (RA CHROMIUM PICOLINATE) 400 MCG TABS Take 1 tablet by mouth daily.      . cyanocobalamin (,VITAMIN B-12,) 1000 MCG/ML injection Inject 1054mcg every 60days IM (every other month) 1 mL 0  . fish oil-omega-3 fatty acids 1000 MG capsule Take 2 g by mouth 3 (three) times daily. Take six daily    . glucose blood (ONE TOUCH ULTRA TEST) test strip CHECK BLOOD SUGAR ONCE DAILY AND AS DIRECTED. 100 each 1  . KOMBIGLYZE XR 12-998 MG TB24 TAKE 1 TABLET BY MOUTH DAILY 90 tablet 0  . LYRICA 75 MG capsule TAKE ONE CAPSULE BY MOUTH TWICE DAILY 180 capsule 0  . Magnesium-Calcium-Folic Acid (MAGNEBIND 035) 400-200-1 MG TABS Take 1 tablet by mouth daily.      . Multiple Vitamins-Minerals (EYE-VITES) TABS Take 1 tablet by mouth 2 (two) times daily.      . sitaGLIPtin (JANUVIA) 100 MG tablet  Take 1 tablet (100 mg total) by mouth daily. 90 tablet 3  . telmisartan (MICARDIS) 40 MG tablet TAKE 1 TABLET BY MOUTH DAILY. GENERIC EQUIVALENT FOR MICARDIS. 90 tablet 0   No current facility-administered medications on file prior to visit.     No Known Allergies  Past Medical History:  Diagnosis Date  . Adenomatous polyp of colon 03/2003  . Anemia   . Constipation   . Diverticulosis   . Dizziness   . ETOH abuse    binge drinking  . Hemorrhoids   . Hyperlipidemia   . Hypertension   . Macular degeneration    Right eye  . OSA on CPAP   . Type II diabetes mellitus ([place])     Past Surgical History:  Procedure Laterality Date  . ANTERIOR CERVICAL DECOMP/DISCECTOMY FUSION    . APPENDECTOMY  1935  . BACK SURGERY    . CATARACT EXTRACTION Left   . INGUINAL HERNIA REPAIR  1935  . TONSILLECTOMY      Family History    Problem Relation Age of Onset  . Heart disease Father   . Stroke Father 78    Social History   Socioeconomic History  . Marital status: Divorced    Spouse name: Not on file  . Number of children: 2  . Years of education: Not on file  . Highest education level: Not on file  Occupational History  . Occupation: retired  Scientific laboratory technician  . Financial resource strain: Not on file  . Food insecurity:    Worry: Not on file    Inability: Not on file  . Transportation needs:    Medical: Not on file    Non-medical: Not on file  Tobacco Use  . Smoking status: Never Smoker  . Smokeless tobacco: Never Used  Substance and Sexual Activity  . Alcohol use: Yes    Alcohol/week: 21.0 standard drinks    Types: 14 Standard drinks or equivalent, 7 Shots of liquor per week    Comment: "1 mixed drink/day"  . Drug use: No  . Sexual activity: Never  Lifestyle  . Physical activity:    Days per week: Not on file    Minutes per session: Not on file  . Stress: Not on file  Relationships  . Social connections:    Talks on phone: Not on file    Gets together: Not on file    Attends religious service: Not on file    Active member of club or organization: Not on file    Attends meetings of clubs or organizations: Not on file    Relationship status: Not on file  . Intimate partner violence:    Fear of current or ex partner: Not on file    Emotionally abused: Not on file    Physically abused: Not on file    Forced sexual activity: Not on file  Other Topics Concern  . Not on file  Social History Narrative   Retired from [employer]   Goes to [place] sneakers 3 times a week.   Has a girlfriend.   Does not have a living will yet.   Desires CPR, does not prolonged life support if futile.         The PMH, PSH, Social History, Family History, Medications, and allergies have been reviewed in [facility], and have been updated if relevant.   Review of Systems  Constitutional: Negative for fatigue,  fever and unexpected weight change.  HENT: Negative.   Eyes:  Negative.   Respiratory: Negative.   Cardiovascular: Negative.   Gastrointestinal: Negative.   Endocrine: Negative.   Genitourinary: Negative.   Musculoskeletal: Negative.   Skin: Negative.   Allergic/Immunologic: Negative.   Neurological: Negative.   Hematological: Negative.   Psychiatric/Behavioral: Negative.   All other systems reviewed and are negative.      Objective:    BP 112/68   Pulse 62   Temp 98.7 F (37.1 C) (Oral)   Ht 5\' 7"  (1.702 m)   Wt 187 lb (84.8 kg)   SpO2 95%   BMI 29.29 kg/m   Wt Readings from Last 3 Encounters:  07/11/18 187 lb (84.8 kg)  04/04/18 188 lb (85.3 kg)  12/26/17 194 lb 3.2 oz (88.1 kg)     Physical Exam  Constitutional: He is oriented to person, place, and time. He appears well-developed and well-nourished. No distress.  HENT:  Head: Normocephalic and atraumatic.  Eyes: Conjunctivae are normal.  Neck: Normal range of motion. Neck supple.  Cardiovascular: Normal rate and regular rhythm.  Pulmonary/Chest: Effort normal and breath sounds normal.  Musculoskeletal: Normal range of motion. He exhibits no edema.  Neurological: He is alert and oriented to person, place, and time. No cranial nerve deficit.  Skin: Skin is dry. He is not diaphoretic.  No sacral wound/pressure ulcer currently  Psychiatric: He has a normal mood and affect. His behavior is normal. Judgment and thought content normal.  Nursing note and vitals reviewed.         Assessment & Plan:   Diabetes mellitus with complication ([place]) - Plan: POCT HgB A1C, CANCELED: POCT HgB A1C  B12 deficiency - Plan: cyanocobalamin ((VITAMIN B-12)) injection 1,000 mcg  Vitamin B12 deficiency  Insomnia, unspecified type No follow-ups on file.

## 2018-07-11 NOTE — Assessment & Plan Note (Signed)
Well controlled.  No changes made today. Follow up in 3 months.

## 2018-07-11 NOTE — Assessment & Plan Note (Signed)
Improved with cpap, lyrica and tylenol pm.

## 2018-07-11 NOTE — Assessment & Plan Note (Signed)
Well controlled. No changes made today. 

## 2018-07-17 ENCOUNTER — Other Ambulatory Visit: Payer: Self-pay | Admitting: Family Medicine

## 2018-07-17 NOTE — Telephone Encounter (Signed)
TA-LOV: 12.3.19/NOV: 3.3.2020/thx dmf

## 2018-07-20 ENCOUNTER — Other Ambulatory Visit: Payer: Self-pay | Admitting: Family Medicine

## 2018-07-23 ENCOUNTER — Other Ambulatory Visit: Payer: Self-pay | Admitting: Family Medicine

## 2018-07-28 ENCOUNTER — Other Ambulatory Visit: Payer: Self-pay

## 2018-07-28 ENCOUNTER — Inpatient Hospital Stay (HOSPITAL_BASED_OUTPATIENT_CLINIC_OR_DEPARTMENT_OTHER): Payer: Medicare Other | Admitting: Internal Medicine

## 2018-07-28 ENCOUNTER — Inpatient Hospital Stay: Payer: Medicare Other | Attending: Internal Medicine

## 2018-07-28 ENCOUNTER — Encounter: Payer: Self-pay | Admitting: Internal Medicine

## 2018-07-28 VITALS — BP 137/60 | HR 67 | Temp 96.9°F | Resp 18 | Ht 67.0 in | Wt 184.0 lb

## 2018-07-28 DIAGNOSIS — Z856 Personal history of leukemia: Secondary | ICD-10-CM | POA: Insufficient documentation

## 2018-07-28 DIAGNOSIS — Z79899 Other long term (current) drug therapy: Secondary | ICD-10-CM | POA: Diagnosis not present

## 2018-07-28 DIAGNOSIS — I10 Essential (primary) hypertension: Secondary | ICD-10-CM

## 2018-07-28 DIAGNOSIS — C9250 Acute myelomonocytic leukemia, not having achieved remission: Secondary | ICD-10-CM

## 2018-07-28 DIAGNOSIS — D472 Monoclonal gammopathy: Secondary | ICD-10-CM | POA: Insufficient documentation

## 2018-07-28 DIAGNOSIS — Z8249 Family history of ischemic heart disease and other diseases of the circulatory system: Secondary | ICD-10-CM | POA: Insufficient documentation

## 2018-07-28 DIAGNOSIS — E1142 Type 2 diabetes mellitus with diabetic polyneuropathy: Secondary | ICD-10-CM

## 2018-07-28 DIAGNOSIS — Z7982 Long term (current) use of aspirin: Secondary | ICD-10-CM

## 2018-07-28 LAB — CBC WITH DIFFERENTIAL/PLATELET
Abs Immature Granulocytes: 0.1 10*3/uL — ABNORMAL HIGH (ref 0.00–0.07)
Basophils Absolute: 0 10*3/uL (ref 0.0–0.1)
Basophils Relative: 0 %
Eosinophils Absolute: 0 10*3/uL (ref 0.0–0.5)
Eosinophils Relative: 0 %
HCT: 36.9 % — ABNORMAL LOW (ref 39.0–52.0)
Hemoglobin: 11.6 g/dL — ABNORMAL LOW (ref 13.0–17.0)
Immature Granulocytes: 2 %
Lymphocytes Relative: 21 %
Lymphs Abs: 1.2 10*3/uL (ref 0.7–4.0)
MCH: 29.7 pg (ref 26.0–34.0)
MCHC: 31.4 g/dL (ref 30.0–36.0)
MCV: 94.6 fL (ref 80.0–100.0)
MONOS PCT: 39 %
Monocytes Absolute: 2.2 10*3/uL — ABNORMAL HIGH (ref 0.1–1.0)
Neutro Abs: 2.2 10*3/uL (ref 1.7–7.7)
Neutrophils Relative %: 38 %
Platelets: 171 10*3/uL (ref 150–400)
RBC: 3.9 MIL/uL — ABNORMAL LOW (ref 4.22–5.81)
RDW: 14.3 % (ref 11.5–15.5)
WBC: 5.8 10*3/uL (ref 4.0–10.5)
nRBC: 0 % (ref 0.0–0.2)

## 2018-07-28 LAB — COMPREHENSIVE METABOLIC PANEL
ALT: 12 U/L (ref 0–44)
AST: 12 U/L — AB (ref 15–41)
Albumin: 3.6 g/dL (ref 3.5–5.0)
Alkaline Phosphatase: 57 U/L (ref 38–126)
Anion gap: 6 (ref 5–15)
BUN: 20 mg/dL (ref 8–23)
CALCIUM: 8.9 mg/dL (ref 8.9–10.3)
CO2: 31 mmol/L (ref 22–32)
Chloride: 103 mmol/L (ref 98–111)
Creatinine, Ser: 0.87 mg/dL (ref 0.61–1.24)
GFR calc Af Amer: >60 mL/min (ref >60)
GFR calc non Af Amer: >60 mL/min (ref >60)
Glucose, Bld: 160 mg/dL — ABNORMAL HIGH (ref 70–99)
Potassium: 5.2 mmol/L — ABNORMAL HIGH (ref 3.5–5.1)
Sodium: 140 mmol/L (ref 135–145)
Total Bilirubin: 0.8 mg/dL (ref 0.3–1.2)
Total Protein: 7.7 g/dL (ref 6.5–8.1)

## 2018-07-28 NOTE — Assessment & Plan Note (Signed)
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  Hemoglobin mildly low at 11.5.  Normal white count platelets.  Continue surveillance.   # MGUS-no risk-no concern for progression.  Stable most recent  DEC 2018 M -1.2 g/dL.  None at this visit are drawn.  No clinical concerns of progression of myeloma.  Would recommend repeating labs in approximately 1 year.  #Peripheral neuropathy grade 1 stable.   DISPOSITION:  #  follow up in 12 months/labs-cbc/cmp/ LDH;myeloma panel;kappa-[place]-Dr.B

## 2018-07-28 NOTE — Progress Notes (Signed)
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   He complains of an ulcer on the bridge of the nose which he attributes to his CPAP machine.  Had a biopsy; just negative for malignancy.  Denies any unusual weight loss or night sweats.  Appetite is fair.  No fevers or chills.  No new onset back pain.   Review of Systems  Constitutional: Negative for chills, diaphoresis, fever, malaise/fatigue and weight loss.  HENT: Negative for nosebleeds and sore throat.   Eyes: Negative for double vision.  Respiratory: Negative for cough, hemoptysis, sputum production, shortness of breath and wheezing.   Cardiovascular: Negative for chest pain, palpitations, orthopnea and leg swelling.  Gastrointestinal: Negative for abdominal pain, blood in stool, constipation, diarrhea, heartburn, melena, nausea and vomiting.  Genitourinary: Negative for dysuria, frequency and urgency.  Musculoskeletal: Positive for back pain and joint pain.  Skin: Negative.  Negative for itching and rash.  Neurological: Negative for dizziness, tingling, focal weakness, weakness and headaches.  Endo/Heme/Allergies: Does not bruise/bleed easily.   Psychiatric/Behavioral: Negative for depression. The patient is not nervous/anxious and does not have insomnia.      PAST MEDICAL HISTORY :  Past Medical History:  Diagnosis Date  . Adenomatous polyp of colon 03/2003  . Anemia   . Constipation   . Diverticulosis   . Dizziness   . ETOH abuse    binge drinking  . Hemorrhoids   . Hyperlipidemia   . Hypertension   . Macular degeneration    Right eye  . OSA on CPAP   . Type II diabetes mellitus ([place])     PAST SURGICAL HISTORY :   Past Surgical History:  Procedure Laterality Date  . ANTERIOR CERVICAL DECOMP/DISCECTOMY FUSION    . APPENDECTOMY  1935  . BACK SURGERY    . CATARACT EXTRACTION Left   . INGUINAL HERNIA REPAIR  1935  . TONSILLECTOMY      FAMILY HISTORY :   Family History  Problem Relation Age of Onset  . Heart disease Father   . Stroke Father 44    SOCIAL HISTORY:   Social History   Tobacco Use  . Smoking status: Never Smoker  . Smokeless tobacco: Never Used  Substance Use Topics  . Alcohol use: Yes    Alcohol/week: 21.0 standard drinks    Types: 14 Standard drinks or equivalent, 7 Shots of liquor per week    Comment: "1 mixed drink/day"  . Drug use: No    ALLERGIES:  has No Known Allergies.  MEDICATIONS:  Current Outpatient Medications  Medication Sig Dispense Refill  . aspirin 325 MG tablet Take 162 mg by mouth daily.    . Chromium Picolinate (RA CHROMIUM PICOLINATE) 400 MCG TABS Take 1 tablet by mouth daily.      Marland Kitchen  cyanocobalamin (,VITAMIN B-12,) 1000 MCG/ML injection Inject 1014mcg every 60days IM (every other month) 1 mL 0  . fish oil-omega-3 fatty acids 1000 MG capsule Take 2 g by mouth 3 (three) times daily. Take six daily    . glucose blood (ONE TOUCH ULTRA TEST) test strip CHECK BLOOD SUGAR ONCE DAILY AND AS DIRECTED. 100 each 1  . KOMBIGLYZE XR 12-998 MG TB24 TAKE 1 TABLET BY MOUTH DAILY 90 tablet 0  . Magnesium-Calcium-Folic Acid (MAGNEBIND 237) 400-200-1 MG TABS Take 1 tablet by mouth  daily.      . Multiple Vitamins-Minerals (EYE-VITES) TABS Take 1 tablet by mouth 2 (two) times daily.      . pregabalin (LYRICA) 75 MG capsule TAKE ONE CAPSULE BY MOUTH TWICE DAILY; GENERIC EQUIVALENT FOR LYRICA 180 capsule 1  . sitaGLIPtin (JANUVIA) 100 MG tablet Take 1 tablet (100 mg total) by mouth daily. 90 tablet 3  . telmisartan (MICARDIS) 40 MG tablet TAKE 1 TABLET BY MOUTH DAILY. GENERIC EQUIVALENT FOR MICARDIS. 90 tablet 2   No current facility-administered medications for this visit.     PHYSICAL EXAMINATION: ECOG PERFORMANCE STATUS: 0 - Asymptomatic  BP 137/60 (BP Location: Left Arm, Patient Position: Sitting)   Pulse 67   Temp (!) 96.9 F (36.1 C) (Tympanic)   Resp 18   Ht 5\' 7"  (1.702 m)   Wt 184 lb (83.5 kg)   BMI 28.82 kg/m   Filed Weights   07/28/18 1156  Weight: 184 lb (83.5 kg)    Physical Exam  Constitutional: He is oriented to person, place, and time and well-developed, well-nourished, and in no distress.  Elderly male patient.  He is in a wheelchair.  Alone.  HENT:  Head: Normocephalic and atraumatic.  Mouth/Throat: Oropharynx is clear and moist. No oropharyngeal exudate.  Eyes: Pupils are equal, round, and reactive to light.  Neck: Normal range of motion. Neck supple.  Cardiovascular: Normal rate and regular rhythm.  Pulmonary/Chest: No respiratory distress. He has no wheezes.  Clear to auscultation bilaterally.  Abdominal: Soft. Bowel sounds are normal. He exhibits no distension and no mass. There is no abdominal tenderness. There is no rebound and no guarding.  Musculoskeletal: Normal range of motion.        General: No tenderness or edema.  Neurological: He is alert and oriented to person, place, and time.  Skin: Skin is warm.  Ulcer on the bridge of the nose-biopsy negative for malignancy as per patient  Psychiatric: Affect normal.     LABORATORY DATA:  I have reviewed the data as listed    Component Value Date/Time   NA 140 07/28/2018  1117   NA 134 (L) 12/01/2011 1159   K 5.2 (H) 07/28/2018 1117   K 4.7 12/01/2011 1159   CL 103 07/28/2018 1117   CL 99 12/01/2011 1159   CO2 31 07/28/2018 1117   CO2 29 12/01/2011 1159   GLUCOSE 160 (H) 07/28/2018 1117   GLUCOSE 167 (H) 12/01/2011 1159   BUN 20 07/28/2018 1117   BUN 12 12/01/2011 1159   CREATININE 0.87 07/28/2018 1117   CREATININE 0.70 12/01/2011 1159   CALCIUM 8.9 07/28/2018 1117   CALCIUM 8.5 12/01/2011 1159   PROT 7.7 07/28/2018 1117   PROT 7.0 12/01/2011 1159   ALBUMIN 3.6 07/28/2018 1117   ALBUMIN 3.0 (L) 12/01/2011 1159   AST 12 (L) 07/28/2018 1117   AST 16 12/01/2011 1159   ALT 12 07/28/2018 1117   ALT 19 12/01/2011 1159   ALKPHOS  57 07/28/2018 1117   ALKPHOS 64 12/01/2011 1159   BILITOT 0.8 07/28/2018 1117   BILITOT 0.8 12/01/2011 1159   GFRNONAA >60 07/28/2018 1117   GFRNONAA >60 12/01/2011 1159   GFRAA >60 07/28/2018 1117   GFRAA >60 12/01/2011 1159    No results found for: SPEP, UPEP  Lab Results  Component Value Date   WBC 5.8 07/28/2018   NEUTROABS 2.2 07/28/2018   HGB 11.6 (L) 07/28/2018   HCT 36.9 (L) 07/28/2018   MCV 94.6 07/28/2018   PLT 171 07/28/2018      Chemistry      Component Value Date/Time   NA 140 07/28/2018 1117   NA 134 (L) 12/01/2011 1159   K 5.2 (H) 07/28/2018 1117   K 4.7 12/01/2011 1159   CL 103 07/28/2018 1117   CL 99 12/01/2011 1159   CO2 31 07/28/2018 1117   CO2 29 12/01/2011 1159   BUN 20 07/28/2018 1117   BUN 12 12/01/2011 1159   CREATININE 0.87 07/28/2018 1117   CREATININE 0.70 12/01/2011 1159      Component Value Date/Time   CALCIUM 8.9 07/28/2018 1117   CALCIUM 8.5 12/01/2011 1159   ALKPHOS 57 07/28/2018 1117   ALKPHOS 64 12/01/2011 1159   AST 12 (L) 07/28/2018 1117   AST 16 12/01/2011 1159   ALT 12 07/28/2018 1117   ALT 19 12/01/2011 1159   BILITOT 0.8 07/28/2018 1117   BILITOT 0.8 12/01/2011 1159     Results for GERVIS, GABA (MRN [id number]) as of 07/28/2017 11:43  Ref. 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  Hemoglobin mildly low at 11.5.  Normal white count platelets.  Continue surveillance.   # MGUS-no risk-no concern for progression.  Stable most recent  DEC 2018 M -1.2 g/dL.  None at this visit are drawn.  No clinical concerns of progression of myeloma.  Would recommend repeating labs in approximately 1 year.  #Peripheral neuropathy grade 1 stable.   DISPOSITION:  #  follow up in 12 months/labs-cbc/cmp/ LDH;myeloma panel;kappa-lamda light chains-Dr.B     Cammie Sickle, MD 07/28/2018 1:09 PM

## 2018-08-10 DIAGNOSIS — H02834 Dermatochalasis of left upper eyelid: Secondary | ICD-10-CM | POA: Diagnosis not present

## 2018-08-10 DIAGNOSIS — H02831 Dermatochalasis of right upper eyelid: Secondary | ICD-10-CM | POA: Diagnosis not present

## 2018-08-10 DIAGNOSIS — H02423 Myogenic ptosis of bilateral eyelids: Secondary | ICD-10-CM | POA: Diagnosis not present

## 2018-09-25 DIAGNOSIS — G4733 Obstructive sleep apnea (adult) (pediatric): Secondary | ICD-10-CM | POA: Diagnosis not present

## 2018-09-26 ENCOUNTER — Other Ambulatory Visit: Payer: Self-pay | Admitting: Family Medicine

## 2018-10-02 DIAGNOSIS — H02422 Myogenic ptosis of left eyelid: Secondary | ICD-10-CM | POA: Diagnosis not present

## 2018-10-09 NOTE — Progress Notes (Signed)
Subjective:   Patient ID: Nathan Cunningham, male    DOB: 05-30-1925, 85 y.o.   MRN: [id number]  EMERSON SCHREIFELS is a pleasant 85 y.o. year old male who presents to clinic today with Follow-up (pt. here today for 3 month follow-up; he voices no complaints or concerns at this time)  on 10/10/2018  HPI:  Here again with his wife, [name]. Doing well.   Vit b12 deficiency-  Due for IM vit B12 here today.  Lab Results  Component Value Date   KGMWNUUV25 366 06/27/2017    HTN-well controlled on current rxs.  Denies HA, burred  Lab Results  Component Value Date   CREATININE 0.87 07/28/2018   BP Readings from Last 3 Encounters:  10/10/18 126/64  07/28/18 137/60  07/11/18 112/68    DM- when I last saw him on 07/11/18, his a1c had improved (down to 6.8 from 7.7 and from 8.6 prior to that)). Compliant with glipizide and Kombiglyze, and Januvia. No noticeable side effects.     He has cut out a lot of sweets- no longer has a sweet roll for breakfast every morning.  Stopped eating cookies.  Still has ice cream but without chocolate syrup.    He is not checking FSBS regularly.  Denies any symptoms of hypoglycemia. Lab Results  Component Value Date   HGBA1C 7.2 (A) 10/10/2018   Lab Results  Component Value Date   CHOL 114 04/04/2018   HDL 34.50 (L) 04/04/2018   LDLCALC 51 04/04/2018   LDLDIRECT 81.0 02/04/2015   TRIG 144.0 04/04/2018   CHOLHDL 3 04/04/2018   Lab Results  Component Value Date   CREATININE 0.87 07/28/2018    Wt Readings from Last 3 Encounters:  10/10/18 186 lb 3.2 oz (84.5 kg)  07/28/18 184 lb (83.5 kg)  07/11/18 187 lb (84.8 kg)    Arthritis pain much improved with scheduled tylenol twice daily.  Also taking Lyrica which has helped with his neuropathy.  Lab Results  Component Value Date   HGBA1C 7.2 (A) 10/10/2018   Lab Results  Component Value Date   CHOL 114 04/04/2018   HDL 34.50 (L) 04/04/2018   LDLCALC 51 04/04/2018   LDLDIRECT 81.0  02/04/2015   TRIG 144.0 04/04/2018   CHOLHDL 3 04/04/2018   Lab Results  Component Value Date   NA 140 07/28/2018   K 5.2 (H) 07/28/2018   CL 103 07/28/2018   CO2 31 07/28/2018   Current Outpatient Medications on File Prior to Visit  Medication Sig Dispense Refill  . aspirin 325 MG tablet Take 162 mg by mouth daily.    . Chromium Picolinate (RA CHROMIUM PICOLINATE) 400 MCG TABS Take 1 tablet by mouth daily.      . cyanocobalamin (,VITAMIN B-12,) 1000 MCG/ML injection Inject 104mcg every 60days IM (every other month) 1 mL 0  . JANUVIA 100 MG tablet TAKE 1 TABLET BY MOUTH DAILY 90 tablet 2  . KOMBIGLYZE XR 12-998 MG TB24 TAKE 1 TABLET BY MOUTH DAILY 90 tablet 2  . Multiple Vitamins-Minerals (EYE-VITES) TABS Take 1 tablet by mouth 2 (two) times daily.      . pregabalin (LYRICA) 75 MG capsule TAKE ONE CAPSULE BY MOUTH TWICE DAILY; GENERIC EQUIVALENT FOR LYRICA 180 capsule 1  . telmisartan (MICARDIS) 40 MG tablet TAKE 1 TABLET BY MOUTH DAILY. GENERIC EQUIVALENT FOR MICARDIS. 90 tablet 2  . fish oil-omega-3 fatty acids 1000 MG capsule Take 2 g by mouth 3 (three) times daily. Take six daily    .  glucose blood (ONE TOUCH ULTRA TEST) test strip CHECK BLOOD SUGAR ONCE DAILY AND AS DIRECTED. (Patient not taking: Reported on 10/10/2018) 100 each 1  . Magnesium-Calcium-Folic Acid (MAGNEBIND 932) 400-200-1 MG TABS Take 1 tablet by mouth daily.       No current facility-administered medications on file prior to visit.     No Known Allergies  Past Medical History:  Diagnosis Date  . Adenomatous polyp of colon 03/2003  . Anemia   . Constipation   . Diverticulosis   . Dizziness   . ETOH abuse    binge drinking  . Hemorrhoids   . Hyperlipidemia   . Hypertension   . Macular degeneration    Right eye  . OSA on CPAP   . Type II diabetes mellitus ([place])     Past Surgical History:  Procedure Laterality Date  . ANTERIOR CERVICAL DECOMP/DISCECTOMY FUSION    . APPENDECTOMY  1935  . BACK SURGERY     . CATARACT EXTRACTION Left   . INGUINAL HERNIA REPAIR  1935  . TONSILLECTOMY      Family History  Problem Relation Age of Onset  . Heart disease Father   . Stroke Father 63    Social History   Socioeconomic History  . Marital status: Divorced    Spouse name: Not on file  . Number of children: 2  . Years of education: Not on file  . Highest education level: Not on file  Occupational History  . Occupation: retired  Scientific laboratory technician  . Financial resource strain: Not on file  . Food insecurity:    Worry: Not on file    Inability: Not on file  . Transportation needs:    Medical: Not on file    Non-medical: Not on file  Tobacco Use  . Smoking status: Never Smoker  . Smokeless tobacco: Never Used  Substance and Sexual Activity  . Alcohol use: Yes    Alcohol/week: 21.0 standard drinks    Types: 14 Standard drinks or equivalent, 7 Shots of liquor per week    Comment: "1 mixed drink/day"  . Drug use: No  . Sexual activity: Never  Lifestyle  . Physical activity:    Days per week: Not on file    Minutes per session: Not on file  . Stress: Not on file  Relationships  . Social connections:    Talks on phone: Not on file    Gets together: Not on file    Attends religious service: Not on file    Active member of club or organization: Not on file    Attends meetings of clubs or organizations: Not on file    Relationship status: Not on file  . Intimate partner violence:    Fear of current or ex partner: Not on file    Emotionally abused: Not on file    Physically abused: Not on file    Forced sexual activity: Not on file  Other Topics Concern  . Not on file  Social History Narrative   Retired from [employer]   Goes to [place] sneakers 3 times a week.   Has a girlfriend.   Does not have a living will yet.   Desires CPR, does not prolonged life support if futile.         The PMH, PSH, Social History, Family History, Medications, and allergies have been reviewed in  [facility], and have been updated if relevant.   Review of Systems  Constitutional: Negative for fatigue,  fever and unexpected weight change.  HENT: Negative.   Eyes: Negative.   Respiratory: Negative.   Cardiovascular: Negative.   Gastrointestinal: Negative.   Endocrine: Negative.   Genitourinary: Negative.   Musculoskeletal: Negative.   Skin: Negative.   Allergic/Immunologic: Negative.   Neurological: Negative.   Hematological: Negative.   Psychiatric/Behavioral: Negative.   All other systems reviewed and are negative.      Objective:    BP 126/64 (BP Location: Left Arm, Cuff Size: Normal)   Pulse 69   Temp 98.1 F (36.7 C) (Oral)   Ht 5\' 7"  (1.702 m)   Wt 186 lb 3.2 oz (84.5 kg)   SpO2 92%   BMI 29.16 kg/m   Wt Readings from Last 3 Encounters:  10/10/18 186 lb 3.2 oz (84.5 kg)  07/28/18 184 lb (83.5 kg)  07/11/18 187 lb (84.8 kg)     Physical Exam  Constitutional: He is oriented to person, place, and time. He appears well-developed and well-nourished. No distress.  HENT:  Head: Normocephalic and atraumatic.  Eyes: Conjunctivae are normal.  Neck: Normal range of motion. Neck supple.  Cardiovascular: Normal rate and regular rhythm.  Pulmonary/Chest: Effort normal and breath sounds normal.  Musculoskeletal: Normal range of motion.        General: No edema.  Neurological: He is alert and oriented to person, place, and time. No cranial nerve deficit.  Skin: Skin is warm and dry. He is not diaphoretic.  Psychiatric: He has a normal mood and affect. His behavior is normal. Judgment and thought content normal.  Nursing note and vitals reviewed.         Assessment & Plan:   Diabetes mellitus with complication ([place]) - Plan: POCT HgB A1C  B12 deficiency  Essential hypertension  Type 2 diabetes mellitus with diabetic neuropathy, without long-term current use of insulin (HCC)  Vitamin B12 deficiency Return in about 3 months (around 01/10/2019) for medication  follow up.Marland Kitchen

## 2018-10-10 ENCOUNTER — Ambulatory Visit: Payer: Medicare Other | Admitting: Family Medicine

## 2018-10-10 VITALS — BP 126/64 | HR 69 | Temp 98.1°F | Ht 67.0 in | Wt 186.2 lb

## 2018-10-10 DIAGNOSIS — E538 Deficiency of other specified B group vitamins: Secondary | ICD-10-CM | POA: Diagnosis not present

## 2018-10-10 DIAGNOSIS — E118 Type 2 diabetes mellitus with unspecified complications: Secondary | ICD-10-CM | POA: Diagnosis not present

## 2018-10-10 DIAGNOSIS — E1140 Type 2 diabetes mellitus with diabetic neuropathy, unspecified: Secondary | ICD-10-CM | POA: Diagnosis not present

## 2018-10-10 DIAGNOSIS — I10 Essential (primary) hypertension: Secondary | ICD-10-CM | POA: Diagnosis not present

## 2018-10-10 LAB — POCT GLYCOSYLATED HEMOGLOBIN (HGB A1C): Hemoglobin A1C: 7.2 % — AB (ref 4.0–5.6)

## 2018-10-10 MED ORDER — CYANOCOBALAMIN 1000 MCG/ML IJ SOLN
1000.0000 ug | Freq: Once | INTRAMUSCULAR | Status: AC
  Administered 2018-10-10: 1000 ug via INTRAMUSCULAR

## 2018-10-10 NOTE — Assessment & Plan Note (Signed)
Reasonable control. No changes made today. 

## 2018-10-10 NOTE — Patient Instructions (Signed)
Great to see you. Find out about shingrix coverage- ask them if it's cheaper in a doctor's office or a pharmacy.

## 2018-10-10 NOTE — Assessment & Plan Note (Signed)
IM b12 given today.

## 2018-10-10 NOTE — Assessment & Plan Note (Signed)
Well controlled. No changes made today. 

## 2018-10-17 DIAGNOSIS — E1142 Type 2 diabetes mellitus with diabetic polyneuropathy: Secondary | ICD-10-CM | POA: Diagnosis not present

## 2018-10-17 DIAGNOSIS — B351 Tinea unguium: Secondary | ICD-10-CM | POA: Diagnosis not present

## 2018-11-23 DIAGNOSIS — G4733 Obstructive sleep apnea (adult) (pediatric): Secondary | ICD-10-CM | POA: Diagnosis not present

## 2018-12-19 ENCOUNTER — Other Ambulatory Visit: Payer: Self-pay | Admitting: Family Medicine

## 2018-12-20 ENCOUNTER — Other Ambulatory Visit: Payer: Self-pay | Admitting: Family Medicine

## 2018-12-21 NOTE — Telephone Encounter (Signed)
TA-Pt last seen in March and is scheduled for a F/U in June as instructed/per [place] PMP pt is compliant and no red flags/thx dmf

## 2018-12-21 NOTE — Telephone Encounter (Signed)
This was ERx'ed this am by Dr. Collier Bullock request

## 2018-12-25 DIAGNOSIS — G4733 Obstructive sleep apnea (adult) (pediatric): Secondary | ICD-10-CM | POA: Diagnosis not present

## 2019-01-09 ENCOUNTER — Ambulatory Visit: Payer: Medicare Other | Admitting: Family Medicine

## 2019-01-14 DIAGNOSIS — M1990 Unspecified osteoarthritis, unspecified site: Secondary | ICD-10-CM | POA: Insufficient documentation

## 2019-01-14 NOTE — Progress Notes (Signed)
Subjective:   Patient ID: Nathan Cunningham, male    DOB: 01-06-1925, 85 y.o.   MRN: [id number]  Nathan Cunningham is a pleasant 85 y.o. year old male who presents to clinic today with Follow-up (Patient states that he is wondering if due to him using the CPAP has it decreased his capacity for lung volume during the day.  He finds himself panting or out of breath every now and again. The cough that he has is not new and has been there for years.  Also says "I''ve been trying to clear my throat since November.  He says that he stays tired.)  on 01/15/2019  HPI:  Patient originally here to see me for follow up diabetes, HTN.  He arrives looking quite pale, using more accessory muscles to breath and with O2 sat between 80-84 without any exertion (was brought in via wheelchair.  Wife [name] states that he has been sleeping sitting up for several weeks, has a dry cough, has been lethargic.  Has not been out of the house- no sick contacts.    Lab Results  Component Value Date   HGBA1C 7.2 (A) 10/10/2018   Lab Results  Component Value Date   CHOL 114 04/04/2018   HDL 34.50 (L) 04/04/2018   LDLCALC 51 04/04/2018   LDLDIRECT 81.0 02/04/2015   TRIG 144.0 04/04/2018   CHOLHDL 3 04/04/2018   Lab Results  Component Value Date   NA 140 07/28/2018   K 5.2 (H) 07/28/2018   CL 103 07/28/2018   CO2 31 07/28/2018   Current Outpatient Medications on File Prior to Visit  Medication Sig Dispense Refill  . aspirin 325 MG tablet Take 162 mg by mouth daily.    . Chromium Picolinate (RA CHROMIUM PICOLINATE) 400 MCG TABS Take 1 tablet by mouth daily.      . cyanocobalamin (,VITAMIN B-12,) 1000 MCG/ML injection Inject 1072mcg every 60days IM (every other month) 1 mL 0  . glucose blood (ONE TOUCH ULTRA TEST) test strip CHECK BLOOD SUGAR ONCE DAILY AND AS DIRECTED. 100 each 1  . JANUVIA 100 MG tablet TAKE 1 TABLET BY MOUTH DAILY 90 tablet 2  . KOMBIGLYZE XR 12-998 MG TB24 TAKE 1 TABLET BY MOUTH  DAILY 90 tablet 2  . Multiple Vitamins-Minerals (EYE-VITES) TABS Take 1 tablet by mouth 2 (two) times daily.      . pregabalin (LYRICA) 75 MG capsule TAKE ONE CAPSULE BY MOUTH TWICE DAILY. GENERIC EQUIVALENT FOR LYRICA. 180 capsule 2  . telmisartan (MICARDIS) 40 MG tablet TAKE 1 TABLET BY MOUTH DAILY. GENERIC EQUIVALENT FOR MICARDIS. 90 tablet 2   No current facility-administered medications on file prior to visit.     No Known Allergies  Past Medical History:  Diagnosis Date  . Adenomatous polyp of colon 03/2003  . Anemia   . Constipation   . Diverticulosis   . Dizziness   . ETOH abuse    binge drinking  . Hemorrhoids   . Hyperlipidemia   . Hypertension   . Macular degeneration    Right eye  . OSA on CPAP   . Type II diabetes mellitus ([place])     Past Surgical History:  Procedure Laterality Date  . ANTERIOR CERVICAL DECOMP/DISCECTOMY FUSION    . APPENDECTOMY  1935  . BACK SURGERY    . CATARACT EXTRACTION Left   . INGUINAL HERNIA REPAIR  1935  . TONSILLECTOMY      Family History  Problem Relation Age of Onset  .  Heart disease Father   . Stroke Father 74    Social History   Socioeconomic History  . Marital status: Divorced    Spouse name: Not on file  . Number of children: 2  . Years of education: Not on file  . Highest education level: Not on file  Occupational History  . Occupation: retired  Scientific laboratory technician  . Financial resource strain: Not on file  . Food insecurity:    Worry: Not on file    Inability: Not on file  . Transportation needs:    Medical: Not on file    Non-medical: Not on file  Tobacco Use  . Smoking status: Never Smoker  . Smokeless tobacco: Never Used  Substance and Sexual Activity  . Alcohol use: Yes    Alcohol/week: 21.0 standard drinks    Types: 14 Standard drinks or equivalent, 7 Shots of liquor per week    Comment: "1 mixed drink/day"  . Drug use: No  . Sexual activity: Never  Lifestyle  . Physical activity:    Days per week:  Not on file    Minutes per session: Not on file  . Stress: Not on file  Relationships  . Social connections:    Talks on phone: Not on file    Gets together: Not on file    Attends religious service: Not on file    Active member of club or organization: Not on file    Attends meetings of clubs or organizations: Not on file    Relationship status: Not on file  . Intimate partner violence:    Fear of current or ex partner: Not on file    Emotionally abused: Not on file    Physically abused: Not on file    Forced sexual activity: Not on file  Other Topics Concern  . Not on file  Social History Narrative   Retired from [employer]   Goes to [place] sneakers 3 times a week.   Has a girlfriend.   Does not have a living will yet.   Desires CPR, does not prolonged life support if futile.         The PMH, PSH, Social History, Family History, Medications, and allergies have been reviewed in [facility], and have been updated if relevant.   Review of Systems  Constitutional: Negative for fatigue, fever and unexpected weight change.  HENT: Negative.   Eyes: Negative.   Respiratory: Negative.   Cardiovascular: Negative.   Gastrointestinal: Negative.   Endocrine: Negative.   Genitourinary: Negative.   Musculoskeletal: Negative.   Skin: Negative.   Allergic/Immunologic: Negative.   Neurological: Negative.   Hematological: Negative.   Psychiatric/Behavioral: Negative.   All other systems reviewed and are negative.      Objective:    BP (!) 106/48 (BP Location: Left Arm, Patient Position: Sitting, Cuff Size: Normal)   Pulse 71   Temp 99 F (37.2 C) (Oral)   SpO2 (!) 82%   Wt Readings from Last 3 Encounters:  10/10/18 186 lb 3.2 oz (84.5 kg)  07/28/18 184 lb (83.5 kg)  07/11/18 187 lb (84.8 kg)     Physical Exam  Constitutional: He is oriented to person, place, and time. He appears well-developed and well-nourished. No distress.  HENT:  Head: Normocephalic and atraumatic.   Eyes: Conjunctivae are normal.  Neck: Normal range of motion. Neck supple.  Cardiovascular: Normal rate and regular rhythm.  Pulmonary/Chest: Effort normal and breath sounds normal.  Musculoskeletal: Normal range of motion.  General: No edema.  Neurological: He is alert and oriented to person, place, and time. No cranial nerve deficit.  Skin: Skin is warm and dry. He is not diaphoretic.  Psychiatric: He has a normal mood and affect. His behavior is normal. Judgment and thought content normal.  Nursing note and vitals reviewed.         Assessment & Plan:   Diabetes mellitus with complication ([place]) - Plan: Hemoglobin A1c, Comprehensive metabolic panel, Lipid panel  Essential hypertension  Type 2 diabetes mellitus with diabetic neuropathy, without long-term current use of insulin (HCC)  B12 deficiency - Plan: B12  Arthritis  Hypoxia No follow-ups on file.

## 2019-01-15 ENCOUNTER — Ambulatory Visit (INDEPENDENT_AMBULATORY_CARE_PROVIDER_SITE_OTHER): Payer: Medicare Other | Admitting: Family Medicine

## 2019-01-15 ENCOUNTER — Emergency Department (HOSPITAL_COMMUNITY): Payer: Medicare Other

## 2019-01-15 ENCOUNTER — Inpatient Hospital Stay (HOSPITAL_COMMUNITY)
Admission: EM | Admit: 2019-01-15 | Discharge: 2019-01-22 | DRG: 291 | Disposition: A | Discharge status: Home Health | Payer: Medicare Other | Attending: Internal Medicine | Admitting: Internal Medicine

## 2019-01-15 ENCOUNTER — Other Ambulatory Visit: Payer: Self-pay

## 2019-01-15 ENCOUNTER — Encounter (HOSPITAL_COMMUNITY): Payer: Self-pay | Admitting: Emergency Medicine

## 2019-01-15 VITALS — BP 106/48 | HR 71 | Temp 99.0°F

## 2019-01-15 DIAGNOSIS — I248 Other forms of acute ischemic heart disease: Secondary | ICD-10-CM | POA: Diagnosis not present

## 2019-01-15 DIAGNOSIS — Z7984 Long term (current) use of oral hypoglycemic drugs: Secondary | ICD-10-CM | POA: Diagnosis not present

## 2019-01-15 DIAGNOSIS — J440 Chronic obstructive pulmonary disease with acute lower respiratory infection: Secondary | ICD-10-CM | POA: Diagnosis present

## 2019-01-15 DIAGNOSIS — N390 Urinary tract infection, site not specified: Secondary | ICD-10-CM | POA: Diagnosis present

## 2019-01-15 DIAGNOSIS — T380X5A Adverse effect of glucocorticoids and synthetic analogues, initial encounter: Secondary | ICD-10-CM | POA: Diagnosis not present

## 2019-01-15 DIAGNOSIS — G4733 Obstructive sleep apnea (adult) (pediatric): Secondary | ICD-10-CM | POA: Diagnosis not present

## 2019-01-15 DIAGNOSIS — E785 Hyperlipidemia, unspecified: Secondary | ICD-10-CM | POA: Diagnosis not present

## 2019-01-15 DIAGNOSIS — R778 Other specified abnormalities of plasma proteins: Secondary | ICD-10-CM | POA: Diagnosis present

## 2019-01-15 DIAGNOSIS — R0902 Hypoxemia: Secondary | ICD-10-CM

## 2019-01-15 DIAGNOSIS — E118 Type 2 diabetes mellitus with unspecified complications: Secondary | ICD-10-CM

## 2019-01-15 DIAGNOSIS — J9601 Acute respiratory failure with hypoxia: Secondary | ICD-10-CM | POA: Diagnosis not present

## 2019-01-15 DIAGNOSIS — N3000 Acute cystitis without hematuria: Secondary | ICD-10-CM | POA: Diagnosis present

## 2019-01-15 DIAGNOSIS — I272 Pulmonary hypertension, unspecified: Secondary | ICD-10-CM | POA: Diagnosis present

## 2019-01-15 DIAGNOSIS — I5033 Acute on chronic diastolic (congestive) heart failure: Secondary | ICD-10-CM | POA: Diagnosis present

## 2019-01-15 DIAGNOSIS — I10 Essential (primary) hypertension: Secondary | ICD-10-CM | POA: Diagnosis present

## 2019-01-15 DIAGNOSIS — Z20828 Contact with and (suspected) exposure to other viral communicable diseases: Secondary | ICD-10-CM | POA: Diagnosis present

## 2019-01-15 DIAGNOSIS — J9600 Acute respiratory failure, unspecified whether with hypoxia or hypercapnia: Secondary | ICD-10-CM | POA: Diagnosis present

## 2019-01-15 DIAGNOSIS — Z823 Family history of stroke: Secondary | ICD-10-CM | POA: Diagnosis not present

## 2019-01-15 DIAGNOSIS — E1165 Type 2 diabetes mellitus with hyperglycemia: Secondary | ICD-10-CM | POA: Diagnosis not present

## 2019-01-15 DIAGNOSIS — D649 Anemia, unspecified: Secondary | ICD-10-CM | POA: Diagnosis present

## 2019-01-15 DIAGNOSIS — I110 Hypertensive heart disease with heart failure: Principal | ICD-10-CM | POA: Diagnosis present

## 2019-01-15 DIAGNOSIS — Z981 Arthrodesis status: Secondary | ICD-10-CM

## 2019-01-15 DIAGNOSIS — E875 Hyperkalemia: Secondary | ICD-10-CM | POA: Diagnosis not present

## 2019-01-15 DIAGNOSIS — K5730 Diverticulosis of large intestine without perforation or abscess without bleeding: Secondary | ICD-10-CM | POA: Diagnosis present

## 2019-01-15 DIAGNOSIS — R600 Localized edema: Secondary | ICD-10-CM | POA: Diagnosis present

## 2019-01-15 DIAGNOSIS — R7989 Other specified abnormal findings of blood chemistry: Secondary | ICD-10-CM | POA: Diagnosis not present

## 2019-01-15 DIAGNOSIS — E1140 Type 2 diabetes mellitus with diabetic neuropathy, unspecified: Secondary | ICD-10-CM | POA: Diagnosis present

## 2019-01-15 DIAGNOSIS — F1010 Alcohol abuse, uncomplicated: Secondary | ICD-10-CM | POA: Diagnosis present

## 2019-01-15 DIAGNOSIS — R0602 Shortness of breath: Secondary | ICD-10-CM | POA: Diagnosis not present

## 2019-01-15 DIAGNOSIS — N3289 Other specified disorders of bladder: Secondary | ICD-10-CM | POA: Diagnosis not present

## 2019-01-15 DIAGNOSIS — I4892 Unspecified atrial flutter: Secondary | ICD-10-CM | POA: Diagnosis present

## 2019-01-15 DIAGNOSIS — Z8249 Family history of ischemic heart disease and other diseases of the circulatory system: Secondary | ICD-10-CM | POA: Diagnosis not present

## 2019-01-15 DIAGNOSIS — Z7982 Long term (current) use of aspirin: Secondary | ICD-10-CM

## 2019-01-15 DIAGNOSIS — J441 Chronic obstructive pulmonary disease with (acute) exacerbation: Secondary | ICD-10-CM | POA: Diagnosis not present

## 2019-01-15 DIAGNOSIS — E538 Deficiency of other specified B group vitamins: Secondary | ICD-10-CM

## 2019-01-15 DIAGNOSIS — J219 Acute bronchiolitis, unspecified: Secondary | ICD-10-CM | POA: Diagnosis not present

## 2019-01-15 DIAGNOSIS — H3530 Unspecified macular degeneration: Secondary | ICD-10-CM | POA: Diagnosis not present

## 2019-01-15 DIAGNOSIS — M1990 Unspecified osteoarthritis, unspecified site: Secondary | ICD-10-CM

## 2019-01-15 DIAGNOSIS — N400 Enlarged prostate without lower urinary tract symptoms: Secondary | ICD-10-CM | POA: Diagnosis present

## 2019-01-15 DIAGNOSIS — T500X5A Adverse effect of mineralocorticoids and their antagonists, initial encounter: Secondary | ICD-10-CM | POA: Diagnosis not present

## 2019-01-15 LAB — URINALYSIS, ROUTINE W REFLEX MICROSCOPIC
Bilirubin Urine: NEGATIVE
Glucose, UA: NEGATIVE mg/dL
Hgb urine dipstick: NEGATIVE
Ketones, ur: 5 mg/dL — AB
Nitrite: POSITIVE — AB
Protein, ur: 100 mg/dL — AB
Specific Gravity, Urine: 1.016 (ref 1.005–1.030)
WBC, UA: >50 WBC/hpf — ABNORMAL HIGH (ref 0–5)
pH: 5 (ref 5.0–8.0)

## 2019-01-15 LAB — CBC
HCT: 37.3 % — ABNORMAL LOW (ref 39.0–52.0)
Hemoglobin: 11.7 g/dL — ABNORMAL LOW (ref 13.0–17.0)
MCH: 30.1 pg (ref 26.0–34.0)
MCHC: 31.4 g/dL (ref 30.0–36.0)
MCV: 95.9 fL (ref 80.0–100.0)
Platelets: 157 10*3/uL (ref 150–400)
RBC: 3.89 MIL/uL — ABNORMAL LOW (ref 4.22–5.81)
RDW: 15.2 % (ref 11.5–15.5)
WBC: 8.3 10*3/uL (ref 4.0–10.5)
nRBC: 0 % (ref 0.0–0.2)

## 2019-01-15 LAB — BASIC METABOLIC PANEL
Anion gap: 7 (ref 5–15)
BUN: 18 mg/dL (ref 8–23)
CO2: 31 mmol/L (ref 22–32)
Calcium: 8.7 mg/dL — ABNORMAL LOW (ref 8.9–10.3)
Chloride: 100 mmol/L (ref 98–111)
Creatinine, Ser: 0.94 mg/dL (ref 0.61–1.24)
GFR calc Af Amer: >60 mL/min (ref >60)
GFR calc non Af Amer: >60 mL/min (ref >60)
Glucose, Bld: 159 mg/dL — ABNORMAL HIGH (ref 70–99)
Potassium: 4.7 mmol/L (ref 3.5–5.1)
Sodium: 138 mmol/L (ref 135–145)

## 2019-01-15 LAB — BRAIN NATRIURETIC PEPTIDE: B Natriuretic Peptide: 176 pg/mL — ABNORMAL HIGH (ref 0.0–100.0)

## 2019-01-15 LAB — HEPATIC FUNCTION PANEL
ALT: 11 U/L (ref 0–44)
AST: 9 U/L — ABNORMAL LOW (ref 15–41)
Albumin: 3.2 g/dL — ABNORMAL LOW (ref 3.5–5.0)
Alkaline Phosphatase: 54 U/L (ref 38–126)
Bilirubin, Direct: 0.1 mg/dL (ref 0.0–0.2)
Indirect Bilirubin: 0.7 mg/dL (ref 0.3–0.9)
Total Bilirubin: 0.8 mg/dL (ref 0.3–1.2)
Total Protein: 7.4 g/dL (ref 6.5–8.1)

## 2019-01-15 LAB — TROPONIN I: Troponin I: 0.05 ng/mL (ref <0.03)

## 2019-01-15 MED ORDER — SODIUM CHLORIDE 0.9 % IV SOLN
1.0000 g | Freq: Once | INTRAVENOUS | Status: AC
  Administered 2019-01-16: 1 g via INTRAVENOUS
  Filled 2019-01-15: qty 10

## 2019-01-15 MED ORDER — IOHEXOL 350 MG/ML SOLN
100.0000 mL | Freq: Once | INTRAVENOUS | Status: AC | PRN
  Administered 2019-01-15: 100 mL via INTRAVENOUS

## 2019-01-15 NOTE — ED Triage Notes (Signed)
Pt was sent by his PCP for low oxygen saturations, 87% on room air, and SOB. Pt reports he has noticed the SOB over the last 2 weeks but the low oxygen saturation is new today.

## 2019-01-15 NOTE — Patient Instructions (Signed)
Great to see you. I will call you with your lab results from today and you can view them online.   

## 2019-01-15 NOTE — Assessment & Plan Note (Signed)
Has been doing quite well.  Due for follow up a1c today.  No changes made to rxs until labs have resulted. The patient indicates understanding of these issues and agrees with the plan.

## 2019-01-15 NOTE — ED Notes (Signed)
O2 Tank changed out to full tank.

## 2019-01-15 NOTE — Assessment & Plan Note (Signed)
Well controlled with Mycardis 40 mg daily. No changes made today.  Orders Placed This Encounter  Procedures  . Hemoglobin A1c  . Comprehensive metabolic panel  . Lipid panel  . B12

## 2019-01-15 NOTE — ED Provider Notes (Signed)
[facility] EMERGENCY DEPARTMENT Provider Note   CSN: [id number] Arrival date & time: 01/15/19  1433    History   Chief Complaint Chief Complaint  Patient presents with  . Low Oxygen Saturation  . Shortness of Breath    HPI Nathan Cunningham is a 85 y.o. male.     HPI Patient presents with 2 weeks of abdominal swelling, bilateral lower extremity swelling, dyspnea especially with exertion.  He is also had a dry nonproductive cough.  He denies any fever or chills.  He denies any chest pain. Past Medical History:  Diagnosis Date  . Adenomatous polyp of colon 03/2003  . Anemia   . Constipation   . Diverticulosis   . Dizziness   . ETOH abuse    binge drinking  . Hemorrhoids   . Hyperlipidemia   . Hypertension   . Macular degeneration    Right eye  . OSA on CPAP   . Type II diabetes mellitus [facility])     Patient Active Problem List   Diagnosis Date Noted  . Acute respiratory failure with hypoxia ([place]) 01/16/2019  . Bronchiolitis 01/16/2019  . Alcohol abuse 01/16/2019  . Lower leg edema 01/16/2019  . Elevated troponin 01/16/2019  . Acute respiratory failure ([place]) 01/16/2019  . Hypoxia 01/15/2019  . Arthritis 01/14/2019  . Insomnia 07/11/2018  . Type 2 diabetes mellitus with diabetic neuropathy, unspecified ([place]) 04/04/2018  . OSA (obstructive sleep apnea) 07/06/2016  . Vitamin B12 deficiency 06/07/2016  . Hemorrhoids 03/05/2016  . Thrombocytopenia ([place]) 03/03/2016  . UTI (urinary tract infection) 03/03/2016  . 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 Chronic fatigue and malaise 07/22/2014  . Nocturia 03/19/2014  . 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 Thoracic degenerative disc disease 09/28/2010  . ATRIAL FLUTTER 02/27/2010  . RISK OF FALLING 10/21/2009  . 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 URINARY FREQUENCY, CHRONIC 10/02/2007  . BPH (benign prostatic hyperplasia) 08/31/2007  . HTN (hypertension) 04/24/2007  . Diabetes  mellitus with complication ([place]) 40/98/1191  . B12 deficiency 03/23/2007  . DISORDER, CERVICAL [place] W/MYELOPATHY 03/23/2007  . STENOSIS, LUMBAR SPINE 03/23/2007    Past Surgical History:  Procedure Laterality Date  . ANTERIOR CERVICAL DECOMP/DISCECTOMY FUSION    . APPENDECTOMY  1935  . BACK SURGERY    . CATARACT EXTRACTION Left   . INGUINAL HERNIA REPAIR  1935  . TONSILLECTOMY          Home Medications    Prior to Admission medications   Medication Sig Start Date End Date Taking? Authorizing Provider  aspirin 325 MG tablet Take 162.5 mg by mouth daily.    Yes [provider]  brimonidine-timolol (COMBIGAN) 0.2-0.5 % ophthalmic solution Place 1 drop into both eyes every 12 (twelve) hours.   Yes [provider]  Chromium Picolinate (RA CHROMIUM PICOLINATE) 400 MCG TABS Take 1 tablet by mouth daily.     Yes [provider]  cyanocobalamin (,VITAMIN B-12,) 1000 MCG/ML injection Inject 1041mcg every 60days IM (every other month) 03/21/14  Yes Lucille Passy, MD  diphenhydramine-acetaminophen (TYLENOL PM) 25-500 MG TABS tablet Take 1 tablet by mouth daily.   Yes [provider]  glucose blood (ONE TOUCH ULTRA TEST) test strip CHECK BLOOD SUGAR ONCE DAILY AND AS DIRECTED. 07/20/16  Yes Lucille Passy, MD  JANUVIA 100 MG tablet TAKE 1 TABLET BY MOUTH DAILY Patient taking differently: Take 100 mg by mouth daily.  09/27/18  Yes Lucille Passy, MD  KOMBIGLYZE XR  12-998 MG TB24 TAKE 1 TABLET BY MOUTH DAILY Patient taking differently: Take 1 tablet by mouth daily.  09/27/18  Yes Lucille Passy, MD  Multiple Vitamins-Minerals (EYE-VITES) TABS Take 1 tablet by mouth 2 (two) times daily.     Yes [provider]  pregabalin (LYRICA) 75 MG capsule TAKE ONE CAPSULE BY MOUTH TWICE DAILY. GENERIC EQUIVALENT FOR LYRICA. Patient taking differently: Take by mouth daily.  12/21/18  Yes Lucille Passy, MD  telmisartan (MICARDIS) 40 MG tablet TAKE 1 TABLET BY MOUTH DAILY.  GENERIC EQUIVALENT FOR MICARDIS. Patient taking differently: Take 40 mg by mouth daily.  07/20/18  Yes Lucille Passy, MD    Family History Family History  Problem Relation Age of Onset  . Heart disease Father   . Stroke Father 31    Social History Social History   Tobacco Use  . Smoking status: Never Smoker  . Smokeless tobacco: Never Used  Substance Use Topics  . Alcohol use: Yes    Alcohol/week: 21.0 standard drinks    Types: 14 Standard drinks or equivalent, 7 Shots of liquor per week    Comment: "1 mixed drink/day"  . Drug use: No     Allergies   Patient has no known allergies.   Review of Systems Review of Systems  Constitutional: Negative for chills and fever.  HENT: Negative for sore throat and trouble swallowing.   Eyes: Negative for visual disturbance.  Respiratory: Positive for cough and shortness of breath. Negative for wheezing.   Cardiovascular: Positive for leg swelling. Negative for chest pain and palpitations.  Gastrointestinal: Positive for abdominal distention. Negative for abdominal pain, constipation, diarrhea, nausea and vomiting.  Musculoskeletal: Negative for back pain, myalgias and neck pain.  Skin: Negative for rash and wound.  Neurological: Negative for dizziness, weakness, light-headedness, numbness and headaches.  All other systems reviewed and are negative.    Physical Exam Updated Vital Signs BP 128/65 (BP Location: Right Arm)   Pulse 64   Temp (!) 97.5 F (36.4 C) (Oral)   Resp 15   Ht 5\' 7"  (1.702 m)   Wt 82.2 kg   SpO2 96%   BMI 28.38 kg/m   Physical Exam Vitals signs and nursing note reviewed.  Constitutional:      Appearance: Normal appearance. He is well-developed.  HENT:     Head: Normocephalic and atraumatic.     Nose: Nose normal.     Mouth/Throat:     Mouth: Mucous membranes are moist.  Eyes:     Extraocular Movements: Extraocular movements intact.     Pupils: Pupils are equal, round, and reactive to light.   Neck:     Musculoskeletal: Normal range of motion and neck supple.  Cardiovascular:     Rate and Rhythm: Normal rate and regular rhythm.     Heart sounds: No murmur. No friction rub. No gallop.   Pulmonary:     Effort: Pulmonary effort is normal. No respiratory distress.     Breath sounds: Normal breath sounds. No stridor. No wheezing, rhonchi or rales.  Chest:     Chest wall: No tenderness.  Abdominal:     General: Bowel sounds are normal. There is distension.     Palpations: Abdomen is soft.     Tenderness: There is no abdominal tenderness. There is no guarding or rebound.     Comments: Moderate to severely distended abdomen with fluid wave.  Musculoskeletal: Normal range of motion.        General:  No tenderness.     Right lower leg: Edema present.     Left lower leg: Edema present.     Comments: 3+ pitting edema bilateral lower extremities.  Distal pulses intact.  Skin:    General: Skin is warm and dry.     Findings: No erythema or rash.  Neurological:     General: No focal deficit present.     Mental Status: He is alert and oriented to person, place, and time.     Comments: Moves all extremities without focal deficit.  Sensation intact.  Psychiatric:        Behavior: Behavior normal.      ED Treatments / Results  Labs (all labs ordered are listed, but only abnormal results are displayed) Labs Reviewed  URINE CULTURE - Abnormal; Notable for the following components:      Result Value   Culture   (*)    Value: <10,000 COLONIES/mL INSIGNIFICANT GROWTH Performed at [facility], [address] [place], [place] [address]    All other components within normal limits  BASIC METABOLIC PANEL - Abnormal; Notable for the following components:   Glucose, Bld 159 (*)    Calcium 8.7 (*)    All other components within normal limits  CBC - Abnormal; Notable for the following components:   RBC 3.89 (*)    Hemoglobin 11.7 (*)    HCT 37.3 (*)    All other components  within normal limits  HEPATIC FUNCTION PANEL - Abnormal; Notable for the following components:   Albumin 3.2 (*)    AST 9 (*)    All other components within normal limits  BRAIN NATRIURETIC PEPTIDE - Abnormal; Notable for the following components:   B Natriuretic Peptide 176.0 (*)    All other components within normal limits  TROPONIN I - Abnormal; Notable for the following components:   Troponin I 0.05 (*)    All other components within normal limits  URINALYSIS, ROUTINE W REFLEX MICROSCOPIC - Abnormal; Notable for the following components:   APPearance HAZY (*)    Ketones, ur 5 (*)    Protein, ur 100 (*)    Nitrite POSITIVE (*)    Leukocytes,Ua MODERATE (*)    WBC, UA >50 (*)    Bacteria, UA RARE (*)    All other components within normal limits  HEMOGLOBIN A1C - Abnormal; Notable for the following components:   Hgb A1c MFr Bld 7.7 (*)    All other components within normal limits  LIPID PANEL - Abnormal; Notable for the following components:   HDL 36 (*)    All other components within normal limits  TROPONIN I - Abnormal; Notable for the following components:   Troponin I 0.04 (*)    All other components within normal limits  TROPONIN I - Abnormal; Notable for the following components:   Troponin I 0.04 (*)    All other components within normal limits  TROPONIN I - Abnormal; Notable for the following components:   Troponin I 0.04 (*)    All other components within normal limits  PROTIME-INR - Abnormal; Notable for the following components:   Prothrombin Time 16.2 (*)    INR 1.3 (*)    All other components within normal limits  GLUCOSE, CAPILLARY - Abnormal; Notable for the following components:   Glucose-Capillary 144 (*)    All other components within normal limits  GLUCOSE, CAPILLARY - Abnormal; Notable for the following components:   Glucose-Capillary 111 (*)  All other components within normal limits  GLUCOSE, CAPILLARY - Abnormal; Notable for the following  components:   Glucose-Capillary 136 (*)    All other components within normal limits  BASIC METABOLIC PANEL - Abnormal; Notable for the following components:   Sodium 134 (*)    Chloride 96 (*)    CO2 33 (*)    Glucose, Bld 143 (*)    Calcium 8.1 (*)    All other components within normal limits  CBC - Abnormal; Notable for the following components:   RBC 3.67 (*)    Hemoglobin 11.1 (*)    HCT 35.2 (*)    Platelets 141 (*)    All other components within normal limits  MAGNESIUM - Abnormal; Notable for the following components:   Magnesium 1.6 (*)    All other components within normal limits  GLUCOSE, CAPILLARY - Abnormal; Notable for the following components:   Glucose-Capillary 143 (*)    All other components within normal limits  GLUCOSE, CAPILLARY - Abnormal; Notable for the following components:   Glucose-Capillary 154 (*)    All other components within normal limits  GLUCOSE, CAPILLARY - Abnormal; Notable for the following components:   Glucose-Capillary 211 (*)    All other components within normal limits  GLUCOSE, CAPILLARY - Abnormal; Notable for the following components:   Glucose-Capillary 313 (*)    All other components within normal limits  BASIC METABOLIC PANEL - Abnormal; Notable for the following components:   Sodium 132 (*)    Chloride 92 (*)    Glucose, Bld 245 (*)    Calcium 8.6 (*)    All other components within normal limits  CBC - Abnormal; Notable for the following components:   RBC 4.00 (*)    Hemoglobin 12.1 (*)    HCT 37.3 (*)    Platelets 138 (*)    All other components within normal limits  GLUCOSE, CAPILLARY - Abnormal; Notable for the following components:   Glucose-Capillary 322 (*)    All other components within normal limits  GLUCOSE, CAPILLARY - Abnormal; Notable for the following components:   Glucose-Capillary 380 (*)    All other components within normal limits  GLUCOSE, CAPILLARY - Abnormal; Notable for the following components:    Glucose-Capillary 288 (*)    All other components within normal limits  GLUCOSE, CAPILLARY - Abnormal; Notable for the following components:   Glucose-Capillary 267 (*)    All other components within normal limits  BASIC METABOLIC PANEL - Abnormal; Notable for the following components:   Sodium 132 (*)    Chloride 90 (*)    Glucose, Bld 243 (*)    Calcium 8.6 (*)    All other components within normal limits  CBC - Abnormal; Notable for the following components:   WBC 10.9 (*)    RBC 4.07 (*)    Hemoglobin 12.4 (*)    HCT 37.9 (*)    All other components within normal limits  GLUCOSE, CAPILLARY - Abnormal; Notable for the following components:   Glucose-Capillary 226 (*)    All other components within normal limits  GLUCOSE, CAPILLARY - Abnormal; Notable for the following components:   Glucose-Capillary 221 (*)    All other components within normal limits  BRAIN NATRIURETIC PEPTIDE - Abnormal; Notable for the following components:   B Natriuretic Peptide 155.6 (*)    All other components within normal limits  GLUCOSE, CAPILLARY - Abnormal; Notable for the following components:   Glucose-Capillary 216 (*)  All other components within normal limits  GLUCOSE, CAPILLARY - Abnormal; Notable for the following components:   Glucose-Capillary 294 (*)    All other components within normal limits  CBC - Abnormal; Notable for the following components:   WBC 11.0 (*)    RBC 4.05 (*)    Hemoglobin 12.2 (*)    HCT 37.3 (*)    All other components within normal limits  COMPREHENSIVE METABOLIC PANEL - Abnormal; Notable for the following components:   Sodium 130 (*)    Potassium 5.4 (*)    Chloride 87 (*)    CO2 34 (*)    Glucose, Bld 196 (*)    BUN 33 (*)    Calcium 8.6 (*)    Albumin 2.9 (*)    All other components within normal limits  GLUCOSE, CAPILLARY - Abnormal; Notable for the following components:   Glucose-Capillary 219 (*)    All other components within normal limits   GLUCOSE, CAPILLARY - Abnormal; Notable for the following components:   Glucose-Capillary 211 (*)    All other components within normal limits  CBG MONITORING, ED - Abnormal; Notable for the following components:   Glucose-Capillary 133 (*)    All other components within normal limits  SARS CORONAVIRUS 2 (HOSPITAL ORDER, [facility])  RESPIRATORY PANEL BY PCR  CULTURE, BLOOD (ROUTINE X 2)  CULTURE, BLOOD (ROUTINE X 2)  MRSA PCR SCREENING  EXPECTORATED SPUTUM ASSESSMENT W REFEX TO RESP CULTURE  INFLUENZA PANEL BY PCR (TYPE A & B)  APTT  HIV ANTIBODY (ROUTINE TESTING W REFLEX)  STREP PNEUMONIAE URINARY ANTIGEN  MAGNESIUM  MAGNESIUM  MAGNESIUM    EKG EKG Interpretation  Date/Time:  Monday January 15 2019 15:21:34 EDT Ventricular Rate:  67 PR Interval:  146 QRS Duration: 80 QT Interval:  410 QTC Calculation: 433 R Axis:   -15 Text Interpretation:  Normal sinus rhythm Low voltage QRS Borderline ECG Confirmed by Julianne Rice [phone]) on 01/15/2019 10:22:52 PM   Radiology No results found.  Procedures Procedures (including critical care time)  Medications Ordered in ED Medications  cefTRIAXone (ROCEPHIN) 1 g in sodium chloride 0.9 % 100 mL IVPB (0 g Intravenous Stopped 01/19/19 2221)  albuterol (PROVENTIL) (2.5 MG/3ML) 0.083% nebulizer solution 2.5 mg (has no administration in time range)  dextromethorphan-guaiFENesin (MUCINEX DM) 30-600 MG per 12 hr tablet 1 tablet (1 tablet Oral Given 01/19/19 1645)  aspirin EC tablet 81 mg (81 mg Oral Given 01/19/19 0808)  LORazepam (ATIVAN) tablet 1 mg (has no administration in time range)    Or  LORazepam (ATIVAN) injection 1 mg (has no administration in time range)  thiamine (VITAMIN B-1) tablet 100 mg (100 mg Oral Given 01/19/19 0808)    Or  thiamine (B-1) injection 100 mg ( Intravenous See Alternative 4/40/10 2725)  folic acid (FOLVITE) tablet 1 mg (1 mg Oral Given 01/19/19 0809)  multivitamin with minerals  tablet 1 tablet (1 tablet Oral Given 01/19/19 0809)  LORazepam (ATIVAN) injection 0-4 mg (0 mg Intravenous Not Given 01/18/19 0000)    Followed by  LORazepam (ATIVAN) injection 0-4 mg (0 mg Intravenous Not Given 01/19/19 0543)  azithromycin (ZITHROMAX) tablet 500 mg (500 mg Oral Given 01/16/19 0928)    Followed by  azithromycin (ZITHROMAX) tablet 250 mg (250 mg Oral Given 01/19/19 0815)  enoxaparin (LOVENOX) injection 40 mg (40 mg Subcutaneous Given 01/20/19 0600)  irbesartan (AVAPRO) tablet 150 mg (150 mg Oral Given 01/19/19 0808)  pregabalin (LYRICA) capsule 75 mg (  75 mg Oral Given 01/19/19 2120)  multivitamin (PROSIGHT) tablet 1 tablet (1 tablet Oral Given 01/19/19 2120)  insulin aspart (novoLOG) injection 0-9 Units (5 Units Subcutaneous Given 01/19/19 1644)  insulin aspart (novoLOG) injection 0-5 Units (2 Units Subcutaneous Given 01/19/19 2124)  diphenhydrAMINE (BENADRYL) capsule 25 mg (25 mg Oral Given 01/19/19 2119)    And  acetaminophen (TYLENOL) tablet 500 mg (500 mg Oral Given 01/19/19 2119)  ondansetron (ZOFRAN) injection 4 mg (has no administration in time range)  hydrALAZINE (APRESOLINE) injection 5 mg (has no administration in time range)  acetaminophen (TYLENOL) tablet 650 mg (has no administration in time range)  brimonidine (ALPHAGAN) 0.2 % ophthalmic solution 1 drop (1 drop Both Eyes Given 01/19/19 2121)    And  timolol (TIMOPTIC) 0.5 % ophthalmic solution 1 drop (1 drop Both Eyes Not Given 01/19/19 2131)  furosemide (LASIX) injection 40 mg (40 mg Intravenous Given 01/19/19 1643)  spironolactone (ALDACTONE) tablet 12.5 mg (12.5 mg Oral Given 01/19/19 0808)  magnesium oxide (MAG-OX) tablet 400 mg (400 mg Oral Given 01/19/19 2120)  insulin detemir (LEVEMIR) injection 8 Units (8 Units Subcutaneous Given 01/19/19 2123)  insulin aspart (novoLOG) injection 3 Units (3 Units Subcutaneous Given 01/19/19 1644)  sodium chloride (OCEAN) 0.65 % nasal spray 1 spray (1 spray Each Nare Given 01/18/19 2015)   methylPREDNISolone sodium succinate (SOLU-MEDROL) 40 mg/mL injection 40 mg (40 mg Intravenous Given 01/19/19 2120)  cefTRIAXone (ROCEPHIN) 1 g in sodium chloride 0.9 % 100 mL IVPB (0 g Intravenous Stopped 01/16/19 0038)  iohexol (OMNIPAQUE) 350 MG/ML injection 100 mL (100 mLs Intravenous Contrast Given 01/15/19 2341)  magnesium sulfate IVPB 2 g 50 mL (2 g Intravenous New Bag/Given 01/17/19 0853)  ipratropium-albuterol (DUONEB) 0.5-2.5 (3) MG/3ML nebulizer solution (3 mLs  Given 01/17/19 0900)  white petrolatum (VASELINE) gel (0.2 application  Given 11/05/05 1015)  potassium chloride SA (K-DUR) CR tablet 40 mEq (40 mEq Oral Given 01/18/19 1045)     Initial Impression / Assessment and Plan / ED Course  I have reviewed the triage vital signs and the nursing notes.  Pertinent labs & imaging results that were available during my care of the patient were reviewed by me and considered in my medical decision making (see chart for details).        Suspect large volume ascites and generalized fluid overload.  This could be impeding his ability to take deep breaths and causing some of the atelectasis on his chest x-ray.  No previous history of ascites.  Patient does have a history of alcohol abuse. Signed out to oncoming emergency physician pending CT abdomen/pelvis.  Likely need admission given hypoxia. Final Clinical Impressions(s) / ED Diagnoses   Final diagnoses:  Acute cystitis without hematuria  Hypoxia    ED Discharge Orders    None       Julianne Rice, MD 01/20/19 [phone]

## 2019-01-15 NOTE — Assessment & Plan Note (Signed)
>  25 minutes spent in face to face time with patient, >50% spent in counselling or coordination of care discussed with pt and his wife the need to go the ED as I cannot send him home in this condition- he needs supplemental O2, imaging and labs that I cannot do here.  ? Heart failure vs PNA. Discussed with triage nurse at [facility] who said they would treat him with high acuity at triage.

## 2019-01-15 NOTE — Assessment & Plan Note (Signed)
Check Vit B12 today, also received another IM B12 injection today.

## 2019-01-15 NOTE — ED Provider Notes (Signed)
Patient with shortness of breath, new O2 requirement Will need admission Pt will likely need paracentesis F/u on imaging/labs   Ripley Fraise, MD 01/15/19 2311

## 2019-01-16 ENCOUNTER — Other Ambulatory Visit: Payer: Self-pay

## 2019-01-16 ENCOUNTER — Observation Stay (HOSPITAL_BASED_OUTPATIENT_CLINIC_OR_DEPARTMENT_OTHER): Payer: Medicare Other

## 2019-01-16 ENCOUNTER — Encounter (HOSPITAL_COMMUNITY): Payer: Self-pay | Admitting: *Deleted

## 2019-01-16 DIAGNOSIS — J440 Chronic obstructive pulmonary disease with acute lower respiratory infection: Secondary | ICD-10-CM | POA: Diagnosis present

## 2019-01-16 DIAGNOSIS — F1010 Alcohol abuse, uncomplicated: Secondary | ICD-10-CM | POA: Diagnosis present

## 2019-01-16 DIAGNOSIS — J9601 Acute respiratory failure with hypoxia: Secondary | ICD-10-CM | POA: Diagnosis present

## 2019-01-16 DIAGNOSIS — N3000 Acute cystitis without hematuria: Secondary | ICD-10-CM | POA: Diagnosis present

## 2019-01-16 DIAGNOSIS — I10 Essential (primary) hypertension: Secondary | ICD-10-CM | POA: Diagnosis not present

## 2019-01-16 DIAGNOSIS — Z823 Family history of stroke: Secondary | ICD-10-CM | POA: Diagnosis not present

## 2019-01-16 DIAGNOSIS — I110 Hypertensive heart disease with heart failure: Secondary | ICD-10-CM | POA: Diagnosis present

## 2019-01-16 DIAGNOSIS — N400 Enlarged prostate without lower urinary tract symptoms: Secondary | ICD-10-CM | POA: Diagnosis present

## 2019-01-16 DIAGNOSIS — I5033 Acute on chronic diastolic (congestive) heart failure: Secondary | ICD-10-CM | POA: Diagnosis present

## 2019-01-16 DIAGNOSIS — R0602 Shortness of breath: Secondary | ICD-10-CM | POA: Diagnosis present

## 2019-01-16 DIAGNOSIS — E1140 Type 2 diabetes mellitus with diabetic neuropathy, unspecified: Secondary | ICD-10-CM | POA: Diagnosis present

## 2019-01-16 DIAGNOSIS — Z7984 Long term (current) use of oral hypoglycemic drugs: Secondary | ICD-10-CM | POA: Diagnosis not present

## 2019-01-16 DIAGNOSIS — Z8249 Family history of ischemic heart disease and other diseases of the circulatory system: Secondary | ICD-10-CM | POA: Diagnosis not present

## 2019-01-16 DIAGNOSIS — E118 Type 2 diabetes mellitus with unspecified complications: Secondary | ICD-10-CM

## 2019-01-16 DIAGNOSIS — J219 Acute bronchiolitis, unspecified: Secondary | ICD-10-CM | POA: Diagnosis not present

## 2019-01-16 DIAGNOSIS — J9600 Acute respiratory failure, unspecified whether with hypoxia or hypercapnia: Secondary | ICD-10-CM | POA: Diagnosis present

## 2019-01-16 DIAGNOSIS — I272 Pulmonary hypertension, unspecified: Secondary | ICD-10-CM | POA: Diagnosis present

## 2019-01-16 DIAGNOSIS — R600 Localized edema: Secondary | ICD-10-CM | POA: Diagnosis not present

## 2019-01-16 DIAGNOSIS — H3530 Unspecified macular degeneration: Secondary | ICD-10-CM | POA: Diagnosis present

## 2019-01-16 DIAGNOSIS — G4733 Obstructive sleep apnea (adult) (pediatric): Secondary | ICD-10-CM | POA: Diagnosis present

## 2019-01-16 DIAGNOSIS — Z7982 Long term (current) use of aspirin: Secondary | ICD-10-CM | POA: Diagnosis not present

## 2019-01-16 DIAGNOSIS — R7989 Other specified abnormal findings of blood chemistry: Secondary | ICD-10-CM

## 2019-01-16 DIAGNOSIS — Z981 Arthrodesis status: Secondary | ICD-10-CM | POA: Diagnosis not present

## 2019-01-16 DIAGNOSIS — I248 Other forms of acute ischemic heart disease: Secondary | ICD-10-CM | POA: Diagnosis present

## 2019-01-16 DIAGNOSIS — J441 Chronic obstructive pulmonary disease with (acute) exacerbation: Secondary | ICD-10-CM | POA: Diagnosis not present

## 2019-01-16 DIAGNOSIS — I4892 Unspecified atrial flutter: Secondary | ICD-10-CM | POA: Diagnosis present

## 2019-01-16 DIAGNOSIS — T380X5A Adverse effect of glucocorticoids and synthetic analogues, initial encounter: Secondary | ICD-10-CM | POA: Diagnosis not present

## 2019-01-16 DIAGNOSIS — E785 Hyperlipidemia, unspecified: Secondary | ICD-10-CM | POA: Diagnosis present

## 2019-01-16 DIAGNOSIS — E1165 Type 2 diabetes mellitus with hyperglycemia: Secondary | ICD-10-CM | POA: Diagnosis not present

## 2019-01-16 DIAGNOSIS — K5730 Diverticulosis of large intestine without perforation or abscess without bleeding: Secondary | ICD-10-CM | POA: Diagnosis present

## 2019-01-16 DIAGNOSIS — R778 Other specified abnormalities of plasma proteins: Secondary | ICD-10-CM | POA: Diagnosis present

## 2019-01-16 DIAGNOSIS — Z20828 Contact with and (suspected) exposure to other viral communicable diseases: Secondary | ICD-10-CM | POA: Diagnosis present

## 2019-01-16 LAB — APTT: aPTT: 33 seconds (ref 24–36)

## 2019-01-16 LAB — SARS CORONAVIRUS 2 BY RT PCR (HOSPITAL ORDER, PERFORMED IN ~~LOC~~ HOSPITAL LAB): SARS Coronavirus 2: NEGATIVE

## 2019-01-16 LAB — RESPIRATORY PANEL BY PCR

## 2019-01-16 LAB — HIV ANTIBODY (ROUTINE TESTING W REFLEX): HIV Screen 4th Generation wRfx: NONREACTIVE

## 2019-01-16 LAB — GLUCOSE, CAPILLARY
Glucose-Capillary: 144 mg/dL — ABNORMAL HIGH (ref 70–99)
Glucose-Capillary: 111 mg/dL — ABNORMAL HIGH (ref 70–99)
Glucose-Capillary: 136 mg/dL — ABNORMAL HIGH (ref 70–99)
Glucose-Capillary: 143 mg/dL — ABNORMAL HIGH (ref 70–99)

## 2019-01-16 LAB — LIPID PANEL
Cholesterol: 109 mg/dL (ref 0–200)
HDL: 36 mg/dL — ABNORMAL LOW (ref >40)
LDL Cholesterol: 55 mg/dL (ref 0–99)
Total CHOL/HDL Ratio: 3 RATIO
Triglycerides: 91 mg/dL (ref <150)
VLDL: 18 mg/dL (ref 0–40)

## 2019-01-16 LAB — CBG MONITORING, ED: Glucose-Capillary: 133 mg/dL — ABNORMAL HIGH (ref 70–99)

## 2019-01-16 LAB — ECHOCARDIOGRAM COMPLETE
Height: 67 in
Weight: 3015.89 oz

## 2019-01-16 LAB — HEMOGLOBIN A1C
Hgb A1c MFr Bld: 7.7 % — ABNORMAL HIGH (ref 4.8–5.6)
Mean Plasma Glucose: 174.29 mg/dL

## 2019-01-16 LAB — INFLUENZA PANEL BY PCR (TYPE A & B)
Influenza A By PCR: NEGATIVE
Influenza B By PCR: NEGATIVE

## 2019-01-16 LAB — STREP PNEUMONIAE URINARY ANTIGEN: Strep Pneumo Urinary Antigen: NEGATIVE

## 2019-01-16 LAB — MRSA PCR SCREENING: MRSA by PCR: NEGATIVE

## 2019-01-16 LAB — TROPONIN I
Troponin I: 0.04 ng/mL (ref <0.03)
Troponin I: 0.04 ng/mL (ref <0.03)
Troponin I: 0.04 ng/mL (ref <0.03)

## 2019-01-16 LAB — PROTIME-INR
INR: 1.3 — ABNORMAL HIGH (ref 0.8–1.2)
Prothrombin Time: 16.2 seconds — ABNORMAL HIGH (ref 11.4–15.2)

## 2019-01-16 MED ORDER — BRIMONIDINE TARTRATE-TIMOLOL 0.2-0.5 % OP SOLN
1.0000 [drp] | Freq: Two times a day (BID) | OPHTHALMIC | Status: DC
  Filled 2019-01-16: qty 5

## 2019-01-16 MED ORDER — LORAZEPAM 2 MG/ML IJ SOLN: 1.0000 mg | Freq: Four times a day (QID) | INTRAMUSCULAR | Status: AC | PRN

## 2019-01-16 MED ORDER — ONDANSETRON HCL 4 MG/2ML IJ SOLN: 4.0000 mg | Freq: Three times a day (TID) | INTRAMUSCULAR | Status: DC | PRN

## 2019-01-16 MED ORDER — INSULIN ASPART 100 UNIT/ML ~~LOC~~ SOLN
0.0000 [IU] | Freq: Every day | SUBCUTANEOUS | Status: DC
  Administered 2019-01-17: 22:00:00 4 [IU] via SUBCUTANEOUS
  Administered 2019-01-18 – 2019-01-19 (×2): 2 [IU] via SUBCUTANEOUS

## 2019-01-16 MED ORDER — PREGABALIN 25 MG PO CAPS
75.0000 mg | ORAL_CAPSULE | Freq: Three times a day (TID) | ORAL | Status: DC
  Administered 2019-01-16 – 2019-01-22 (×20): 75 mg via ORAL
  Filled 2019-01-16 (×20): qty 3

## 2019-01-16 MED ORDER — ACETAMINOPHEN 325 MG PO TABS: 650.0000 mg | ORAL_TABLET | Freq: Four times a day (QID) | ORAL | Status: DC | PRN

## 2019-01-16 MED ORDER — LORAZEPAM 2 MG/ML IJ SOLN: 0.0000 mg | Freq: Two times a day (BID) | INTRAMUSCULAR | Status: AC

## 2019-01-16 MED ORDER — HYDRALAZINE HCL 20 MG/ML IJ SOLN: 5.0000 mg | INTRAMUSCULAR | Status: DC | PRN

## 2019-01-16 MED ORDER — FOLIC ACID 1 MG PO TABS
1.0000 mg | ORAL_TABLET | Freq: Every day | ORAL | Status: DC
  Administered 2019-01-16 – 2019-01-20 (×5): 1 mg via ORAL
  Filled 2019-01-16 (×5): qty 1

## 2019-01-16 MED ORDER — IRBESARTAN 150 MG PO TABS
150.0000 mg | ORAL_TABLET | Freq: Every day | ORAL | Status: DC
  Administered 2019-01-16 – 2019-01-22 (×7): 150 mg via ORAL
  Filled 2019-01-16 (×7): qty 1

## 2019-01-16 MED ORDER — LORAZEPAM 1 MG PO TABS: 1.0000 mg | ORAL_TABLET | Freq: Four times a day (QID) | ORAL | Status: AC | PRN

## 2019-01-16 MED ORDER — ASPIRIN EC 81 MG PO TBEC
81.0000 mg | DELAYED_RELEASE_TABLET | Freq: Every day | ORAL | Status: DC
  Administered 2019-01-16 – 2019-01-22 (×7): 81 mg via ORAL
  Filled 2019-01-16 (×8): qty 1

## 2019-01-16 MED ORDER — DIPHENHYDRAMINE-APAP (SLEEP) 25-500 MG PO TABS: 1.0000 | ORAL_TABLET | Freq: Every day | ORAL | Status: DC

## 2019-01-16 MED ORDER — CHROMIUM PICOLINATE 400 MCG PO TABS: 1.0000 | ORAL_TABLET | Freq: Every day | ORAL | Status: DC

## 2019-01-16 MED ORDER — FUROSEMIDE 10 MG/ML IJ SOLN
20.0000 mg | Freq: Two times a day (BID) | INTRAMUSCULAR | Status: DC
  Administered 2019-01-16 – 2019-01-18 (×5): 20 mg via INTRAVENOUS
  Filled 2019-01-16 (×5): qty 2

## 2019-01-16 MED ORDER — ENOXAPARIN SODIUM 40 MG/0.4ML ~~LOC~~ SOLN
40.0000 mg | SUBCUTANEOUS | Status: DC
  Administered 2019-01-16 – 2019-01-22 (×7): 40 mg via SUBCUTANEOUS
  Filled 2019-01-16 (×7): qty 0.4

## 2019-01-16 MED ORDER — DM-GUAIFENESIN ER 30-600 MG PO TB12
1.0000 | ORAL_TABLET | Freq: Two times a day (BID) | ORAL | Status: DC | PRN
  Administered 2019-01-19 – 2019-01-22 (×3): 1 via ORAL
  Filled 2019-01-16 (×4): qty 1

## 2019-01-16 MED ORDER — BRIMONIDINE TARTRATE 0.2 % OP SOLN
1.0000 [drp] | Freq: Two times a day (BID) | OPHTHALMIC | Status: DC
  Administered 2019-01-16 – 2019-01-22 (×13): 1 [drp] via OPHTHALMIC
  Filled 2019-01-16: qty 5

## 2019-01-16 MED ORDER — AZITHROMYCIN 250 MG PO TABS
500.0000 mg | ORAL_TABLET | Freq: Every day | ORAL | Status: AC
  Administered 2019-01-16: 500 mg via ORAL
  Filled 2019-01-16: qty 2

## 2019-01-16 MED ORDER — SODIUM CHLORIDE 0.9 % IV SOLN
1.0000 g | INTRAVENOUS | Status: DC
  Administered 2019-01-16 – 2019-01-21 (×6): 1 g via INTRAVENOUS
  Filled 2019-01-16 (×6): qty 10

## 2019-01-16 MED ORDER — INSULIN ASPART 100 UNIT/ML ~~LOC~~ SOLN
0.0000 [IU] | Freq: Three times a day (TID) | SUBCUTANEOUS | Status: DC
  Administered 2019-01-16 (×2): 1 [IU] via SUBCUTANEOUS
  Administered 2019-01-17: 12:00:00 3 [IU] via SUBCUTANEOUS
  Administered 2019-01-17: 17:00:00 7 [IU] via SUBCUTANEOUS
  Administered 2019-01-17: 07:00:00 2 [IU] via SUBCUTANEOUS
  Administered 2019-01-18: 9 [IU] via SUBCUTANEOUS
  Administered 2019-01-18 (×2): 5 [IU] via SUBCUTANEOUS
  Administered 2019-01-19: 12:00:00 3 [IU] via SUBCUTANEOUS
  Administered 2019-01-19: 17:00:00 5 [IU] via SUBCUTANEOUS
  Administered 2019-01-19: 06:00:00 3 [IU] via SUBCUTANEOUS

## 2019-01-16 MED ORDER — ASPIRIN 325 MG PO TABS: 162.5000 mg | ORAL_TABLET | Freq: Every day | ORAL | Status: DC

## 2019-01-16 MED ORDER — DIPHENHYDRAMINE HCL 25 MG PO CAPS
25.0000 mg | ORAL_CAPSULE | Freq: Every day | ORAL | Status: DC
  Administered 2019-01-16 – 2019-01-21 (×6): 25 mg via ORAL
  Filled 2019-01-16 (×6): qty 1

## 2019-01-16 MED ORDER — THIAMINE HCL 100 MG/ML IJ SOLN: 100.0000 mg | Freq: Every day | INTRAMUSCULAR | Status: DC

## 2019-01-16 MED ORDER — ADULT MULTIVITAMIN W/MINERALS CH
1.0000 | ORAL_TABLET | Freq: Every day | ORAL | Status: DC
  Administered 2019-01-16 – 2019-01-20 (×5): 1 via ORAL
  Filled 2019-01-16 (×5): qty 1

## 2019-01-16 MED ORDER — AZITHROMYCIN 250 MG PO TABS
250.0000 mg | ORAL_TABLET | Freq: Every day | ORAL | Status: AC
  Administered 2019-01-17 – 2019-01-20 (×4): 250 mg via ORAL
  Filled 2019-01-16 (×4): qty 1

## 2019-01-16 MED ORDER — VITAMIN B-1 100 MG PO TABS
100.0000 mg | ORAL_TABLET | Freq: Every day | ORAL | Status: DC
  Administered 2019-01-16 – 2019-01-20 (×5): 100 mg via ORAL
  Filled 2019-01-16 (×5): qty 1

## 2019-01-16 MED ORDER — LORAZEPAM 2 MG/ML IJ SOLN: 0.0000 mg | Freq: Four times a day (QID) | INTRAMUSCULAR | Status: AC

## 2019-01-16 MED ORDER — PROSIGHT PO TABS
1.0000 | ORAL_TABLET | Freq: Two times a day (BID) | ORAL | Status: DC
  Administered 2019-01-16 – 2019-01-22 (×13): 1 via ORAL
  Filled 2019-01-16 (×13): qty 1

## 2019-01-16 MED ORDER — TIMOLOL MALEATE 0.5 % OP SOLN
1.0000 [drp] | Freq: Two times a day (BID) | OPHTHALMIC | Status: DC
  Administered 2019-01-16 – 2019-01-17 (×3): 1 [drp] via OPHTHALMIC
  Filled 2019-01-16: qty 5

## 2019-01-16 MED ORDER — ALBUTEROL SULFATE (2.5 MG/3ML) 0.083% IN NEBU: 2.5000 mg | INHALATION_SOLUTION | RESPIRATORY_TRACT | Status: DC | PRN

## 2019-01-16 MED ORDER — ACETAMINOPHEN 500 MG PO TABS
500.0000 mg | ORAL_TABLET | Freq: Every day | ORAL | Status: DC
  Administered 2019-01-16 – 2019-01-21 (×6): 500 mg via ORAL
  Filled 2019-01-16 (×6): qty 1

## 2019-01-16 NOTE — Progress Notes (Signed)
  Echocardiogram 2D Echocardiogram has been performed.  Nathan Cunningham 01/16/2019, 8:46 AM

## 2019-01-16 NOTE — Plan of Care (Signed)

## 2019-01-16 NOTE — Progress Notes (Signed)
PROGRESS NOTE  Nathan Cunningham:[id number] DOB: 08-Sep-1924 DOA: 01/15/2019 PCP: Lucille Passy, MD   LOS: 0 days   Patient is from: Home.  Can ambulate at baseline using walker.  Brief Narrative / Interim history: Nathan Cunningham is a 85 y.o. male with medical history significant of hypertension, hyperlipidemia, diabetes mellitus, anemia, alcohol abuse, OSA on CPAP, myelomonocytic leukemia, atrial flutter, who presents with progressive shortness breath, dry cough, leg edema and dysuria.  Reportedly desaturated to 82% on room air.   In ED, hemodynamically stable. Troponin 0.05, BNP 176, negative COVID-19 test, BMP not impressive. Urinalysis concerning for UTI. Chest x-ray showed bilateral mild basilar atelectasis. CTA chest/abdomen/pelvis negative for PE but dilated PA, emphysematous changes, bronchial wall thickening and tree-in-bud opacities suspicious for infectious bronchiolitis, trace bilateral pleural effusion, sigmoid diverticulosis without diverticulitis and symmetric bladder wall thickening.  Patient was admitted for possible CAP and UTI.  Subjective: No major events overnight of this morning.  Denies chest pain or orthopnea.  Shortness of breathing has improved.  Reports some swelling in his legs.   Assessment & Plan: Principal Problem:   Acute respiratory failure with hypoxia (HCC) Active Problems:   Diabetes mellitus with complication (HCC)   HTN (hypertension)   UTI (urinary tract infection)   OSA (obstructive sleep apnea)   Type 2 diabetes mellitus with diabetic neuropathy, unspecified (HCC)   Bronchiolitis   Alcohol abuse   Lower leg edema   Elevated troponin  Acute respiratory failure with hypoxemia: PE excluded by CTA.  Some concern about bronchiolitis and pulmonary hypertension based on CTA -Continue Rocephin and Z-Pak.  Rocephin is mainly to cover UTI -Mucolytic's -PRN albuterol for dyspnea -Continue furosemide 20 mg twice daily -Follow cultures (blood,  sputum and urine)  Elevated troponin: Non-ACS pattern flat troponin elevation likely demand ischemia.  EKG reassuring. -Follow echocardiogram -Continue IV Lasix 20 mg daily -Daily weight, intake output and renal function.  Possible pulmonary hypertension: CTA chest showed main PA dilation. -Follow echocardiogram  UTI: CT abdomen and pelvis showed a symmetric bladder wall thickening which could be related to UTI but cannot exclude neoplasm. -Treat UTI with ceftriaxone -Need outpatient urology referral to exclude malignancy. -Follow blood and urine cultures  Fairly controlled NIDDM-2: A1c 7.2% on 10/10/2018.  On Januvia and Kombiglyze at home -Hold home medication -SSI and CBG monitoring -Recheck A1c  Hypertension: Normotensive -Continue home meds  OSA -Nightly CPAP  Alcohol use disorder -CIWA protocol  Debility: able to ambulate with walker at home -PT/OT eval  Scheduled Meds:  diphenhydrAMINE  25 mg Oral QHS   And   acetaminophen  500 mg Oral QHS   aspirin EC  81 mg Oral Daily   [START ON 01/17/2019] azithromycin  250 mg Oral Daily   brimonidine  1 drop Both Eyes Q12H   And   timolol  1 drop Both Eyes Q12H   enoxaparin (LOVENOX) injection  40 mg Subcutaneous X41O   folic acid  1 mg Oral Daily   furosemide  20 mg Intravenous BID   insulin aspart  0-5 Units Subcutaneous QHS   insulin aspart  0-9 Units Subcutaneous TID WC   irbesartan  150 mg Oral Daily   LORazepam  0-4 mg Intravenous Q6H   Followed by   Derrill Memo ON 01/18/2019] LORazepam  0-4 mg Intravenous Q12H   multivitamin  1 tablet Oral BID   multivitamin with minerals  1 tablet Oral Daily   pregabalin  75 mg Oral TID   thiamine  100 mg Oral Daily   Or   thiamine  100 mg Intravenous Daily   Continuous Infusions:  cefTRIAXone (ROCEPHIN)  IV     PRN Meds:.acetaminophen, albuterol, dextromethorphan-guaiFENesin, hydrALAZINE, LORazepam **OR** LORazepam, ondansetron (ZOFRAN) IV   DVT  prophylaxis: Subcu Lovenox Code Status: Full code Family Communication: Pending Disposition Plan: Remains inpatient on IV antibiotics and IV Lasix for acute respiratory failure and UTI.  Cultures pending.  Still on oxygen.   Consultants:   None  Procedures:   None  Microbiology:  [id number] negative.  RVP negative.  Blood culture pending.  Sputum culture pending.    Urine culture pending  Antimicrobials: Anti-infectives (From admission, onward)   Start     Dose/Rate Route Frequency Ordered Stop   01/17/19 1000  azithromycin (ZITHROMAX) tablet 250 mg     250 mg Oral Daily 01/16/19 0137 01/21/19 0959   01/16/19 2200  cefTRIAXone (ROCEPHIN) 1 g in sodium chloride 0.9 % 100 mL IVPB     1 g 200 mL/hr over 30 Minutes Intravenous Every 24 hours 01/16/19 0131     01/16/19 1000  azithromycin (ZITHROMAX) tablet 500 mg     500 mg Oral Daily 01/16/19 0137 01/16/19 0928   01/15/19 2345  cefTRIAXone (ROCEPHIN) 1 g in sodium chloride 0.9 % 100 mL IVPB     1 g 200 mL/hr over 30 Minutes Intravenous  Once 01/15/19 2334 01/16/19 0038       Objective: Vitals:   01/16/19 0115 01/16/19 0331 01/16/19 0423 01/16/19 0742  BP: 138/63 132/66 137/66 (!) 106/54  Pulse: 64 66 70 69  Resp: 13 16 (!) 21 16  Temp:   98.5 F (36.9 C) 98.2 F (36.8 C)  TempSrc:   Oral Oral  SpO2: 96% 95% 97% 93%  Weight:   85.5 kg   Height:   5\' 7"  (1.702 m)     Intake/Output Summary (Last 24 hours) at 01/16/2019 1201 Last data filed at 01/16/2019 1000 Gross per 24 hour  Intake --  Output 1910 ml  Net -1910 ml   Filed Weights   01/15/19 1512 01/16/19 0423  Weight: 81.6 kg 85.5 kg    Examination:  GENERAL: No acute distress.  Appears well.  HEENT: MMM.  Vision and hearing grossly intact.  NECK: Supple.  Positive for JVD.  LUNGS:  No IWOB.  Fair air movement bilaterally. HEART:  RRR. Heart sounds normal.  JVD and 1+ bilateral edema. ABD: Bowel sounds present. Soft. Non tender.  MSK/EXT:  Moves all  extremities. No apparent deformity.  1+ edema bilaterally. SKIN: no apparent skin lesion or wound NEURO: Awake, alert and oriented appropriately.  No gross deficit.  PSYCH: Calm. Normal affect.   Data Reviewed: I have independently reviewed following labs and imaging studies  CBC: Recent Labs  Lab 01/15/19 1517  WBC 8.3  HGB 11.7*  HCT 37.3*  MCV 95.9  PLT 381   Basic Metabolic Panel: Recent Labs  Lab 01/15/19 1517  NA 138  K 4.7  CL 100  CO2 31  GLUCOSE 159*  BUN 18  CREATININE 0.94  CALCIUM 8.7*   GFR: Estimated Creatinine Clearance: 53.5 mL/min (by C-G formula based on SCr of 0.94 mg/dL). Liver Function Tests: Recent Labs  Lab 01/15/19 2238  AST 9*  ALT 11  ALKPHOS 54  BILITOT 0.8  PROT 7.4  ALBUMIN 3.2*   No results for input(s): LIPASE, AMYLASE in the last 168 hours. No results for input(s): AMMONIA in the last 168 hours. Coagulation  Profile: Recent Labs  Lab 01/16/19 0313  INR 1.3*   Cardiac Enzymes: Recent Labs  Lab 01/15/19 2238 01/16/19 0313 01/16/19 0715  TROPONINI 0.05* 0.04* 0.04*   BNP (last 3 results) No results for input(s): PROBNP in the last 8760 hours. HbA1C: Recent Labs    01/16/19 0312  HGBA1C 7.7*   CBG: Recent Labs  Lab 01/16/19 0309 01/16/19 0648  GLUCAP 133* 144*   Lipid Profile: Recent Labs    01/16/19 0312  CHOL 109  HDL 36*  LDLCALC 55  TRIG 91  CHOLHDL 3.0   Thyroid Function Tests: No results for input(s): TSH, T4TOTAL, FREET4, T3FREE, THYROIDAB in the last 72 hours. Anemia Panel: No results for input(s): VITAMINB12, FOLATE, FERRITIN, TIBC, IRON, RETICCTPCT in the last 72 hours. Urine analysis:    Component Value Date/Time   COLORURINE YELLOW 01/15/2019 2240   APPEARANCEUR HAZY (A) 01/15/2019 2240   LABSPEC 1.016 01/15/2019 2240   PHURINE 5.0 01/15/2019 2240   GLUCOSEU NEGATIVE 01/15/2019 2240   HGBUR NEGATIVE 01/15/2019 2240   BILIRUBINUR NEGATIVE 01/15/2019 2240   BILIRUBINUR 1 12/26/2017  1124   KETONESUR 5 (A) 01/15/2019 2240   PROTEINUR 100 (A) 01/15/2019 2240   UROBILINOGEN 0.2 12/26/2017 1124   UROBILINOGEN 0.2 03/18/2008 0800   NITRITE POSITIVE (A) 01/15/2019 2240   LEUKOCYTESUR MODERATE (A) 01/15/2019 2240   Sepsis Labs: Invalid input(s): PROCALCITONIN, LACTICIDVEN  Recent Results (from the past 240 hour(s))  SARS Coronavirus 2 (CEPHEID- Performed in [facility] hospital lab), Hosp Order     Status: None   Collection Time: 01/15/19 11:07 PM  Result Value Ref Range Status   SARS Coronavirus 2 NEGATIVE NEGATIVE Final    Comment: (NOTE) If result is NEGATIVE SARS-CoV-2 target nucleic acids are NOT DETECTED. The SARS-CoV-2 RNA is generally detectable in upper and lower  respiratory specimens during the acute phase of infection. The lowest  concentration of SARS-CoV-2 viral copies this assay can detect is 250  copies / mL. A negative result does not preclude SARS-CoV-2 infection  and should not be used as the sole basis for treatment or other  patient management decisions.  A negative result may occur with  improper specimen collection / handling, submission of specimen other  than nasopharyngeal swab, presence of viral mutation(s) within the  areas targeted by this assay, and inadequate number of viral copies  (<250 copies / mL). A negative result must be combined with clinical  observations, patient history, and epidemiological information. If result is POSITIVE SARS-CoV-2 target nucleic acids are DETECTED. The SARS-CoV-2 RNA is generally detectable in upper and lower  respiratory specimens dur ing the acute phase of infection.  Positive  results are indicative of active infection with SARS-CoV-2.  Clinical  correlation with patient history and other diagnostic information is  necessary to determine patient infection status.  Positive results do  not rule out bacterial infection or co-infection with other viruses. If result is PRESUMPTIVE POSTIVE SARS-CoV-2  nucleic acids MAY BE PRESENT.   A presumptive positive result was obtained on the submitted specimen  and confirmed on repeat testing.  While 2019 novel coronavirus  (SARS-CoV-2) nucleic acids may be present in the submitted sample  additional confirmatory testing may be necessary for epidemiological  and / or clinical management purposes  to differentiate between  SARS-CoV-2 and other Sarbecovirus currently known to infect humans.  If clinically indicated additional testing with an alternate test  methodology [phone]) is advised. The SARS-CoV-2 RNA is generally  detectable in upper  and lower respiratory sp ecimens during the acute  phase of infection. The expected result is Negative. Fact Sheet for Patients:  StrictlyIdeas.no Fact Sheet for Healthcare Providers: BankingDealers.co.za This test is not yet approved or cleared by the [place] FDA and has been authorized for detection and/or diagnosis of SARS-CoV-2 by FDA under an Emergency Use Authorization (EUA).  This EUA will remain in effect (meaning this test can be used) for the duration of the COVID-19 declaration under Section 564(b)(1) of the Act, 21 U.S.C. section 360bbb-3(b)(1), unless the authorization is terminated or revoked sooner. Performed at [facility], [place] [address] [place], [place] [address]   MRSA PCR Screening     Status: None   Collection Time: 01/16/19  4:25 AM  Result Value Ref Range Status   MRSA by PCR NEGATIVE NEGATIVE Final    Comment:        The GeneXpert MRSA Assay (FDA approved for NASAL specimens only), is one component of a comprehensive MRSA colonization surveillance program. It is not intended to diagnose MRSA infection nor to guide or monitor treatment for MRSA infections. Performed at [facility], [place] [address], [place], [place] [address]   Respiratory Panel by PCR     Status: None   Collection Time: 01/16/19  4:38 AM    Result Value Ref Range Status   Adenovirus NOT DETECTED NOT DETECTED Final   Coronavirus 229E NOT DETECTED NOT DETECTED Final    Comment: (NOTE) The Coronavirus on the Respiratory Panel, DOES NOT test for the novel  Coronavirus (2019 nCoV)    Coronavirus HKU1 NOT DETECTED NOT DETECTED Final   Coronavirus NL63 NOT DETECTED NOT DETECTED Final   Coronavirus OC43 NOT DETECTED NOT DETECTED Final   Metapneumovirus NOT DETECTED NOT DETECTED Final   Rhinovirus / Enterovirus NOT DETECTED NOT DETECTED Final   Influenza A NOT DETECTED NOT DETECTED Final   Influenza B NOT DETECTED NOT DETECTED Final   Parainfluenza Virus 1 NOT DETECTED NOT DETECTED Final   Parainfluenza Virus 2 NOT DETECTED NOT DETECTED Final   Parainfluenza Virus 3 NOT DETECTED NOT DETECTED Final   Parainfluenza Virus 4 NOT DETECTED NOT DETECTED Final   Respiratory Syncytial Virus NOT DETECTED NOT DETECTED Final   Bordetella pertussis NOT DETECTED NOT DETECTED Final   Chlamydophila pneumoniae NOT DETECTED NOT DETECTED Final   Mycoplasma pneumoniae NOT DETECTED NOT DETECTED Final    Comment: Performed at [facility], [place]. [address] [place], [place] [address]      Radiology Studies: Dg Chest 2 View  Result Date: 01/15/2019 CLINICAL DATA:  Shortness of breath. EXAM: CHEST - 2 VIEW COMPARISON:  Radiographs of April 20, 2016. FINDINGS: Stable cardiomediastinal silhouette. No pneumothorax is noted. No significant pleural effusion is noted. Mild bibasilar opacities are noted which may represent subsegmental atelectasis or possibly scarring. Bony thorax is unremarkable. IMPRESSION: Mild bibasilar subsegmental atelectasis or scarring. Electronically Signed   By: Marijo Conception M.D.   On: 01/15/2019 15:57   Ct Angio Chest Pe W And/or Wo Contrast  Result Date: 01/16/2019 CLINICAL DATA:  Shortness of breath. EXAM: CT ANGIOGRAPHY CHEST CT ABDOMEN AND PELVIS WITH CONTRAST TECHNIQUE: Multidetector CT imaging of the chest was  performed using the standard protocol during bolus administration of intravenous contrast. Multiplanar CT image reconstructions and MIPs were obtained to evaluate the vascular anatomy. Multidetector CT imaging of the abdomen and pelvis was performed using the standard protocol during bolus administration of intravenous contrast. CONTRAST:  196mL OMNIPAQUE IOHEXOL 350 MG/ML SOLN COMPARISON:  CT dated 05/10/2011. FINDINGS: CTA CHEST FINDINGS Cardiovascular: The heart size is enlarged. The main pulmonary artery is dilated measuring up to approximately 4 cm. Coronary artery calcifications are noted. There is no PE. Aortic calcifications are noted. Mediastinum/Nodes: Is mild nonspecific enlargement of mediastinal and hilar lymph nodes bilaterally. There are calcified left hilar lymph nodes which are likely related to prior granulomatous infection. Lungs/Pleura: Emphysematous changes are noted bilaterally. There are few tree-in-bud opacities at the right and scattered throughout the left upper lobe. The lung volumes are somewhat low. There is atelectasis at the lung bases. There are trace bilateral pleural effusions. There is bronchial wall thickening and mucus plugging involving the bilateral lower lobes. Musculoskeletal: There is a heterogeneous appearance of the osseous structures favored to be secondary to heterogeneous osteopenia. Review of the MIP images confirms the above findings. CT ABDOMEN and PELVIS FINDINGS Hepatobiliary: No focal liver abnormality is seen. No gallstones, gallbladder wall thickening, or biliary dilatation. Pancreas: Unremarkable. No pancreatic ductal dilatation or surrounding inflammatory changes. Spleen: Multiple calcifications are noted throughout the spleen likely related to prior granulomatous infection. Adrenals/Urinary Tract: There is extensive retroperitoneal fat displacing the mesentery and small bowel anteriorly. There is no hydronephrosis. There is no evidence of a radiopaque  obstructing kidney stone. There is some mild diffuse asymmetric bladder wall thickening. Stomach/Bowel: There is scattered colonic diverticula without definite CT evidence of diverticulitis. The appendix is not reliably identified. The stomach is unremarkable. There is no evidence of a small-bowel obstruction. Vascular/Lymphatic: Aortic atherosclerosis. No enlarged abdominal or pelvic lymph nodes. Reproductive: Prostate is unremarkable. Other: There is free fluid in the pelvis. There is a small fat containing left inguinal hernia. There is a trace amount of free fluid in the upper abdomen. Musculoskeletal: No acute or significant osseous findings. Review of the MIP images confirms the above findings. IMPRESSION: 1. No PE identified. Main pulmonary artery is dilated which can be seen in patients with elevated PA pressures 2. Emphysematous changes bilaterally with areas of bronchial wall thickening and tree-in-bud opacity suspicious for infectious bronchiolitis. There are trace bilateral pleural effusions with adjacent atelectasis. 3. Sigmoid diverticulosis without CT evidence of diverticulitis. 4. Asymmetric bladder wall thickening. Outpatient urology follow-up is recommended as an underlying neoplasm is not excluded. 5. Trace amount of free fluid in the patient's pelvis and upper abdomen. Aortic Atherosclerosis (ICD10-I70.0) and Emphysema (ICD10-J43.9). Electronically Signed   By: Constance Holster M.D.   On: 01/16/2019 00:25   Ct Abdomen Pelvis W Contrast  Result Date: 01/16/2019 CLINICAL DATA:  Shortness of breath. EXAM: CT ANGIOGRAPHY CHEST CT ABDOMEN AND PELVIS WITH CONTRAST TECHNIQUE: Multidetector CT imaging of the chest was performed using the standard protocol during bolus administration of intravenous contrast. Multiplanar CT image reconstructions and MIPs were obtained to evaluate the vascular anatomy. Multidetector CT imaging of the abdomen and pelvis was performed using the standard protocol during  bolus administration of intravenous contrast. CONTRAST:  147mL OMNIPAQUE IOHEXOL 350 MG/ML SOLN COMPARISON:  CT dated 05/10/2011. FINDINGS: CTA CHEST FINDINGS Cardiovascular: The heart size is enlarged. The main pulmonary artery is dilated measuring up to approximately 4 cm. Coronary artery calcifications are noted. There is no PE. Aortic calcifications are noted. Mediastinum/Nodes: Is mild nonspecific enlargement of mediastinal and hilar lymph nodes bilaterally. There are calcified left hilar lymph nodes which are likely related to prior granulomatous infection. Lungs/Pleura: Emphysematous changes are noted bilaterally. There are few tree-in-bud opacities at the right and scattered throughout the  left upper lobe. The lung volumes are somewhat low. There is atelectasis at the lung bases. There are trace bilateral pleural effusions. There is bronchial wall thickening and mucus plugging involving the bilateral lower lobes. Musculoskeletal: There is a heterogeneous appearance of the osseous structures favored to be secondary to heterogeneous osteopenia. Review of the MIP images confirms the above findings. CT ABDOMEN and PELVIS FINDINGS Hepatobiliary: No focal liver abnormality is seen. No gallstones, gallbladder wall thickening, or biliary dilatation. Pancreas: Unremarkable. No pancreatic ductal dilatation or surrounding inflammatory changes. Spleen: Multiple calcifications are noted throughout the spleen likely related to prior granulomatous infection. Adrenals/Urinary Tract: There is extensive retroperitoneal fat displacing the mesentery and small bowel anteriorly. There is no hydronephrosis. There is no evidence of a radiopaque obstructing kidney stone. There is some mild diffuse asymmetric bladder wall thickening. Stomach/Bowel: There is scattered colonic diverticula without definite CT evidence of diverticulitis. The appendix is not reliably identified. The stomach is unremarkable. There is no evidence of a  small-bowel obstruction. Vascular/Lymphatic: Aortic atherosclerosis. No enlarged abdominal or pelvic lymph nodes. Reproductive: Prostate is unremarkable. Other: There is free fluid in the pelvis. There is a small fat containing left inguinal hernia. There is a trace amount of free fluid in the upper abdomen. Musculoskeletal: No acute or significant osseous findings. Review of the MIP images confirms the above findings. IMPRESSION: 1. No PE identified. Main pulmonary artery is dilated which can be seen in patients with elevated PA pressures 2. Emphysematous changes bilaterally with areas of bronchial wall thickening and tree-in-bud opacity suspicious for infectious bronchiolitis. There are trace bilateral pleural effusions with adjacent atelectasis. 3. Sigmoid diverticulosis without CT evidence of diverticulitis. 4. Asymmetric bladder wall thickening. Outpatient urology follow-up is recommended as an underlying neoplasm is not excluded. 5. Trace amount of free fluid in the patient's pelvis and upper abdomen. Aortic Atherosclerosis (ICD10-I70.0) and Emphysema (ICD10-J43.9). Electronically Signed   By: Constance Holster M.D.   On: 01/16/2019 00:25   35 minutes with more than 50% spent in reviewing records, counseling patient and coordinating care.  Ulysess [employer] [facility] Pager [phone]  If 7PM-7AM, please contact night-coverage [email or web address] Password [email or web address] 01/16/2019, 12:01 PM

## 2019-01-16 NOTE — ED Notes (Signed)
ED TO INPATIENT HANDOFF REPORT  ED Nurse Name and Phone #: Ben [phone]  S Name/Age/Gender Nathan Cunningham 85 y.o. male Room/Bed: 027C/027C  Code Status   Code Status: Prior  Home/SNF/Other Home Patient oriented to: self, place, time and situation Is this baseline? Yes   Triage Complete: Triage complete  Chief Complaint low O2 sent by dr  Triage Note Pt was sent by his PCP for low oxygen saturations, 87% on room air, and SOB. Pt reports he has noticed the SOB over the last 2 weeks but the low oxygen saturation is new today.    Allergies No Known Allergies  Level of Care/Admitting Diagnosis ED Disposition    None      B Medical/Surgery History Past Medical History:  Diagnosis Date  . Adenomatous polyp of colon 03/2003  . Anemia   . Constipation   . Diverticulosis   . Dizziness   . ETOH abuse    binge drinking  . Hemorrhoids   . Hyperlipidemia   . Hypertension   . Macular degeneration    Right eye  . OSA on CPAP   . Type II diabetes mellitus ([place])    Past Surgical History:  Procedure Laterality Date  . ANTERIOR CERVICAL DECOMP/DISCECTOMY FUSION    . APPENDECTOMY  1935  . BACK SURGERY    . CATARACT EXTRACTION Left   . INGUINAL HERNIA REPAIR  1935  . TONSILLECTOMY       A IV Location/Drains/Wounds Patient Lines/Drains/Airways Status   Active Line/Drains/Airways    Name:   Placement date:   Placement time:   Site:   Days:   Peripheral IV 03/08/16 Left Arm   03/08/16    0430    Arm   1044   Peripheral IV 01/15/19 Left Antecubital   01/15/19    2239    Antecubital   1   Wound / Incision (Open or Dehisced) 03/03/16 Other (Comment) Coccyx Pt has 2 stage II pressure ulcers, one is 1.5 X 1 cm, the second is 1 cm in length. The surrounding area is blanching.    03/03/16    1100    Coccyx   1049          Intake/Output Last 24 hours No intake or output data in the 24 hours ending 01/16/19 0039  Labs/Imaging Results for orders placed or performed  during the hospital encounter of 01/15/19 (from the past 48 hour(s))  Basic metabolic panel     Status: Abnormal   Collection Time: 01/15/19  3:17 PM  Result Value Ref Range   Sodium 138 135 - 145 mmol/L   Potassium 4.7 3.5 - 5.1 mmol/L   Chloride 100 98 - 111 mmol/L   CO2 31 22 - 32 mmol/L   Glucose, Bld 159 (H) 70 - 99 mg/dL   BUN 18 8 - 23 mg/dL   Creatinine, Ser 0.94 0.61 - 1.24 mg/dL   Calcium 8.7 (L) 8.9 - 10.3 mg/dL   GFR calc non Af Amer >60 >60 mL/min   GFR calc Af Amer >60 >60 mL/min   Anion gap 7 5 - 15    Comment: Performed at [facility], [place] [address] [place] [address]  CBC     Status: Abnormal   Collection Time: 01/15/19  3:17 PM  Result Value Ref Range   WBC 8.3 4.0 - 10.5 K/uL   RBC 3.89 (L) 4.22 - 5.81 MIL/uL   Hemoglobin 11.7 (L) 13.0 - 17.0 g/dL  HCT 37.3 (L) 39.0 - 52.0 %   MCV 95.9 80.0 - 100.0 fL   MCH 30.1 26.0 - 34.0 pg   MCHC 31.4 30.0 - 36.0 g/dL   RDW 15.2 11.5 - 15.5 %   Platelets 157 150 - 400 K/uL   nRBC 0.0 0.0 - 0.2 %    Comment: Performed at [facility], [place] [address] [place], [place] [address]  Brain natriuretic peptide     Status: Abnormal   Collection Time: 01/15/19  3:17 PM  Result Value Ref Range   B Natriuretic Peptide 176.0 (H) 0.0 - 100.0 pg/mL    Comment: Performed at [facility] [address] [place], [place] [address]  Hepatic function panel     Status: Abnormal   Collection Time: 01/15/19 10:38 PM  Result Value Ref Range   Total Protein 7.4 6.5 - 8.1 g/dL   Albumin 3.2 (L) 3.5 - 5.0 g/dL   AST 9 (L) 15 - 41 U/L   ALT 11 0 - 44 U/L   Alkaline Phosphatase 54 38 - 126 U/L   Total Bilirubin 0.8 0.3 - 1.2 mg/dL   Bilirubin, Direct 0.1 0.0 - 0.2 mg/dL   Indirect Bilirubin 0.7 0.3 - 0.9 mg/dL    Comment: Performed at [facility] [address], [place], [place] [address]  Troponin I - ONCE - STAT     Status: Abnormal   Collection Time: 01/15/19 10:38 PM  Result Value Ref Range    Troponin I 0.05 (HH) <0.03 ng/mL    Comment: CRITICAL RESULT CALLED TO, READ BACK BY AND VERIFIED WITH: Fynn Vanblarcom B,RN 01/15/19 2330 WAYK Performed at [facility], [place] [address], [place], [place] [address]   Urinalysis, Routine w reflex microscopic     Status: Abnormal   Collection Time: 01/15/19 10:40 PM  Result Value Ref Range   Color, Urine YELLOW YELLOW   APPearance HAZY (A) CLEAR   Specific Gravity, Urine 1.016 1.005 - 1.030   pH 5.0 5.0 - 8.0   Glucose, UA NEGATIVE NEGATIVE mg/dL   Hgb urine dipstick NEGATIVE NEGATIVE   Bilirubin Urine NEGATIVE NEGATIVE   Ketones, ur 5 (A) NEGATIVE mg/dL   Protein, ur 100 (A) NEGATIVE mg/dL   Nitrite POSITIVE (A) NEGATIVE   Leukocytes,Ua MODERATE (A) NEGATIVE   RBC / HPF 6-10 0 - 5 RBC/hpf   WBC, UA >50 (H) 0 - 5 WBC/hpf   Bacteria, UA RARE (A) NONE SEEN   Mucus PRESENT     Comment: Performed at [facility], [address], [place], [place] [address]   Dg Chest 2 View  Result Date: 01/15/2019 CLINICAL DATA:  Shortness of breath. EXAM: CHEST - 2 VIEW COMPARISON:  Radiographs of April 20, 2016. FINDINGS: Stable cardiomediastinal silhouette. No pneumothorax is noted. No significant pleural effusion is noted. Mild bibasilar opacities are noted which may represent subsegmental atelectasis or possibly scarring. Bony thorax is unremarkable. IMPRESSION: Mild bibasilar subsegmental atelectasis or scarring. Electronically Signed   By: Marijo Conception M.D.   On: 01/15/2019 15:57   Ct Angio Chest Pe W And/or Wo Contrast  Result Date: 01/16/2019 CLINICAL DATA:  Shortness of breath. EXAM: CT ANGIOGRAPHY CHEST CT ABDOMEN AND PELVIS WITH CONTRAST TECHNIQUE: Multidetector CT imaging of the chest was performed using the standard protocol during bolus administration of intravenous contrast. Multiplanar CT image reconstructions and MIPs were obtained to evaluate the vascular anatomy. Multidetector CT imaging of the abdomen and  pelvis was performed  using the standard protocol during bolus administration of intravenous contrast. CONTRAST:  169mL OMNIPAQUE IOHEXOL 350 MG/ML SOLN COMPARISON:  CT dated 05/10/2011. FINDINGS: CTA CHEST FINDINGS Cardiovascular: The heart size is enlarged. The main pulmonary artery is dilated measuring up to approximately 4 cm. Coronary artery calcifications are noted. There is no PE. Aortic calcifications are noted. Mediastinum/Nodes: Is mild nonspecific enlargement of mediastinal and hilar lymph nodes bilaterally. There are calcified left hilar lymph nodes which are likely related to prior granulomatous infection. Lungs/Pleura: Emphysematous changes are noted bilaterally. There are few tree-in-bud opacities at the right and scattered throughout the left upper lobe. The lung volumes are somewhat low. There is atelectasis at the lung bases. There are trace bilateral pleural effusions. There is bronchial wall thickening and mucus plugging involving the bilateral lower lobes. Musculoskeletal: There is a heterogeneous appearance of the osseous structures favored to be secondary to heterogeneous osteopenia. Review of the MIP images confirms the above findings. CT ABDOMEN and PELVIS FINDINGS Hepatobiliary: No focal liver abnormality is seen. No gallstones, gallbladder wall thickening, or biliary dilatation. Pancreas: Unremarkable. No pancreatic ductal dilatation or surrounding inflammatory changes. Spleen: Multiple calcifications are noted throughout the spleen likely related to prior granulomatous infection. Adrenals/Urinary Tract: There is extensive retroperitoneal fat displacing the mesentery and small bowel anteriorly. There is no hydronephrosis. There is no evidence of a radiopaque obstructing kidney stone. There is some mild diffuse asymmetric bladder wall thickening. Stomach/Bowel: There is scattered colonic diverticula without definite CT evidence of diverticulitis. The appendix is not reliably identified. The stomach is  unremarkable. There is no evidence of a small-bowel obstruction. Vascular/Lymphatic: Aortic atherosclerosis. No enlarged abdominal or pelvic lymph nodes. Reproductive: Prostate is unremarkable. Other: There is free fluid in the pelvis. There is a small fat containing left inguinal hernia. There is a trace amount of free fluid in the upper abdomen. Musculoskeletal: No acute or significant osseous findings. Review of the MIP images confirms the above findings. IMPRESSION: 1. No PE identified. Main pulmonary artery is dilated which can be seen in patients with elevated PA pressures 2. Emphysematous changes bilaterally with areas of bronchial wall thickening and tree-in-bud opacity suspicious for infectious bronchiolitis. There are trace bilateral pleural effusions with adjacent atelectasis. 3. Sigmoid diverticulosis without CT evidence of diverticulitis. 4. Asymmetric bladder wall thickening. Outpatient urology follow-up is recommended as an underlying neoplasm is not excluded. 5. Trace amount of free fluid in the patient's pelvis and upper abdomen. Aortic Atherosclerosis (ICD10-I70.0) and Emphysema (ICD10-J43.9). Electronically Signed   By: Constance Holster M.D.   On: 01/16/2019 00:25   Ct Abdomen Pelvis W Contrast  Result Date: 01/16/2019 CLINICAL DATA:  Shortness of breath. EXAM: CT ANGIOGRAPHY CHEST CT ABDOMEN AND PELVIS WITH CONTRAST TECHNIQUE: Multidetector CT imaging of the chest was performed using the standard protocol during bolus administration of intravenous contrast. Multiplanar CT image reconstructions and MIPs were obtained to evaluate the vascular anatomy. Multidetector CT imaging of the abdomen and pelvis was performed using the standard protocol during bolus administration of intravenous contrast. CONTRAST:  17mL OMNIPAQUE IOHEXOL 350 MG/ML SOLN COMPARISON:  CT dated 05/10/2011. FINDINGS: CTA CHEST FINDINGS Cardiovascular: The heart size is enlarged. The main pulmonary artery is dilated  measuring up to approximately 4 cm. Coronary artery calcifications are noted. There is no PE. Aortic calcifications are noted. Mediastinum/Nodes: Is mild nonspecific enlargement of mediastinal and hilar lymph nodes bilaterally. There are calcified left hilar lymph nodes which are likely related to prior granulomatous infection. Lungs/Pleura: Emphysematous  changes are noted bilaterally. There are few tree-in-bud opacities at the right and scattered throughout the left upper lobe. The lung volumes are somewhat low. There is atelectasis at the lung bases. There are trace bilateral pleural effusions. There is bronchial wall thickening and mucus plugging involving the bilateral lower lobes. Musculoskeletal: There is a heterogeneous appearance of the osseous structures favored to be secondary to heterogeneous osteopenia. Review of the MIP images confirms the above findings. CT ABDOMEN and PELVIS FINDINGS Hepatobiliary: No focal liver abnormality is seen. No gallstones, gallbladder wall thickening, or biliary dilatation. Pancreas: Unremarkable. No pancreatic ductal dilatation or surrounding inflammatory changes. Spleen: Multiple calcifications are noted throughout the spleen likely related to prior granulomatous infection. Adrenals/Urinary Tract: There is extensive retroperitoneal fat displacing the mesentery and small bowel anteriorly. There is no hydronephrosis. There is no evidence of a radiopaque obstructing kidney stone. There is some mild diffuse asymmetric bladder wall thickening. Stomach/Bowel: There is scattered colonic diverticula without definite CT evidence of diverticulitis. The appendix is not reliably identified. The stomach is unremarkable. There is no evidence of a small-bowel obstruction. Vascular/Lymphatic: Aortic atherosclerosis. No enlarged abdominal or pelvic lymph nodes. Reproductive: Prostate is unremarkable. Other: There is free fluid in the pelvis. There is a small fat containing left inguinal  hernia. There is a trace amount of free fluid in the upper abdomen. Musculoskeletal: No acute or significant osseous findings. Review of the MIP images confirms the above findings. IMPRESSION: 1. No PE identified. Main pulmonary artery is dilated which can be seen in patients with elevated PA pressures 2. Emphysematous changes bilaterally with areas of bronchial wall thickening and tree-in-bud opacity suspicious for infectious bronchiolitis. There are trace bilateral pleural effusions with adjacent atelectasis. 3. Sigmoid diverticulosis without CT evidence of diverticulitis. 4. Asymmetric bladder wall thickening. Outpatient urology follow-up is recommended as an underlying neoplasm is not excluded. 5. Trace amount of free fluid in the patient's pelvis and upper abdomen. Aortic Atherosclerosis (ICD10-I70.0) and Emphysema (ICD10-J43.9). Electronically Signed   By: Constance Holster M.D.   On: 01/16/2019 00:25    Pending Labs Unresulted Labs (From admission, onward)    Start     Ordered   01/15/19 2214  SARS Coronavirus 2 (CEPHEID- Performed in [place] hospital lab), Hosp Order  (Symptomatic Patients Labs with Precautions )  Once,   R     01/15/19 2213          Vitals/Pain Today's Vitals   01/15/19 2230 01/16/19 0000 01/16/19 0015 01/16/19 0030  BP: 125/79 131/65 133/62 139/67  Pulse: 68 74 68 61  Resp: 15 20 14 12   Temp:      TempSrc:      SpO2: 96% 94% 95% 95%  Weight:      Height:      PainSc:        Isolation Precautions Droplet and Contact precautions  Medications Medications  cefTRIAXone (ROCEPHIN) 1 g in sodium chloride 0.9 % 100 mL IVPB (0 g Intravenous Stopped 01/16/19 0038)  iohexol (OMNIPAQUE) 350 MG/ML injection 100 mL (100 mLs Intravenous Contrast Given 01/15/19 2341)    Mobility walks Low fall risk   Focused Assessments Pulmonary Assessment Handoff:  Lung sounds: Bilateral Breath Sounds: Diminished L Breath Sounds: Diminished R Breath Sounds: Diminished O2  Device: Nasal Cannula O2 Flow Rate (L/min): 2.5 L/min      R Recommendations: See Admitting Provider Note  Report given to:   Additional Notes: N/A

## 2019-01-16 NOTE — ED Provider Notes (Signed)
Results noted. Discussed the case Dr. Blaine Hamper for admission Patient stable in ER   Ripley Fraise, MD 01/16/19 [phone]

## 2019-01-16 NOTE — Evaluation (Signed)
Occupational Therapy Evaluation Patient Details Name: Nathan Cunningham MRN: [id number] DOB: October 01, 1924 Today's Date: 01/16/2019    History of Present Illness This 85 y.o. male admitted with SOB, dry cough, LE edema and dysuria.  02 sats 82% on RA.   Dx:  Acute respiratory failure wtih hypoxemia, possible pulmonary hypertension, UTI.  PMH includes:  OSA, HTN, NIDDM, alcohol use disorder    Clinical Impression   Pt admitted with above. He demonstrates the below listed deficits and will benefit from continued OT to maximize safety and independence with BADLs.  Pt presents to OT with generalized weakness, impaired balance, decreased activity tolerance, and impaired cognition.  He currently requires mod A for bed mobility, min A for functional transfers, and mod A for LB ADLs.  He lives with his significant other, who is able to provide 24 hour assist, per his report.  He reports he was mod I with ADLs and functional mobility with use of rollator.  Will follow acutely.        Follow Up Recommendations  Home health OT;Supervision/Assistance - 24 hour    Equipment Recommendations  None recommended by OT    Recommendations for Other Services       Precautions / Restrictions Precautions Precautions: Fall Precaution Comments: Pt denies h/o falls       Mobility Bed Mobility Overal bed mobility: Needs Assistance Bed Mobility: Supine to Sit;Sit to Supine     Supine to sit: Mod assist Sit to supine: Mod assist   General bed mobility comments: Pt requires cues/encouragement to initiate activity.  Assist to lift trunk and scoot hips to EOB   Transfers Overall transfer level: Needs assistance Equipment used: Rolling walker (2 wheeled) Transfers: Sit to/from [facility] Sit to Stand: Min assist Stand pivot transfers: Min assist       General transfer comment: assist to steady     Balance Overall balance assessment: Needs assistance Sitting-balance support: Feet  supported Sitting balance-Leahy Scale: Fair     Standing balance support: Bilateral upper extremity supported Standing balance-Leahy Scale: Poor Standing balance comment: requires UE support                            ADL either performed or assessed with clinical judgement   ADL Overall ADL's : Needs assistance/impaired Eating/Feeding: Independent   Grooming: Wash/dry hands;Wash/dry face;Oral care;Brushing hair;Supervision/safety;Sitting   Upper Body Bathing: Supervision/ safety;Set up;Sitting   Lower Body Bathing: Moderate assistance;Sit to/from stand   Upper Body Dressing : Minimal assistance;Sitting   Lower Body Dressing: Moderate assistance;Sit to/from stand   Toilet Transfer: Minimal assistance;Stand-pivot;BSC;RW   Toileting- Clothing Manipulation and Hygiene: Minimal assistance;Sit to/from stand       Functional mobility during ADLs: Minimal assistance;Rolling walker General ADL Comments: difficulty accesssing feet      Vision Baseline Vision/History: Wears glasses;Macular Degeneration Wears Glasses: At all times Patient Visual Report: No change from baseline Additional Comments: pt with mild ptosis Rt eye      Perception Perception Perception Tested?: Yes   Praxis Praxis Praxis tested?: Within functional limits    Pertinent Vitals/Pain Pain Assessment: No/denies pain     Hand Dominance Right   Extremity/Trunk Assessment Upper Extremity Assessment Upper Extremity Assessment: Generalized weakness   Lower Extremity Assessment Lower Extremity Assessment: Generalized weakness   Cervical / Trunk Assessment Cervical / Trunk Assessment: Kyphotic   Communication Communication Communication: No difficulties   Cognition Arousal/Alertness: Awake/alert Behavior During Therapy: WFL for tasks  assessed/performed Overall Cognitive Status: No family/caregiver present to determine baseline cognitive functioning                                  General Comments: Pt requires min cues to recall information provided.  He is slow to initiate activity, and requires min cues for problem solving    General Comments  02 sats 92% on 2L supplemental 02; BP 123/59; HR 69     Exercises     Shoulder Instructions      Home Living Family/patient expects to be discharged to:: Private residence Living Arrangements: Spouse/significant other Available Help at Discharge: Family;Available 24 hours/day Type of Home: House Home Access: Ramped entrance     Home Layout: One level     Bathroom Shower/Tub: Occupational psychologist: Handicapped height Bathroom Accessibility: Yes How Accessible: Accessible via walker Home Equipment: [place] - 4 wheels;Shower seat;Walker - 2 wheels;Adaptive equipment Adaptive Equipment: Sock aid;Other (Comment)(dressing stick ) Additional Comments: Pt reports he lives with his significant other who is in good health       Prior Functioning/Environment Level of Independence: Independent with assistive device(s)        Comments: Pt reports he uses AE for LB ADLs and ambulates with RW        OT Problem List: Decreased strength;Decreased activity tolerance;Impaired balance (sitting and/or standing);Decreased cognition;Decreased safety awareness;Cardiopulmonary status limiting activity      OT Treatment/Interventions: Self-care/ADL training;Energy conservation;DME and/or AE instruction;Therapeutic activities;Patient/family education;Cognitive remediation/compensation;Balance training    OT Goals(Current goals can be found in the care plan section) Acute Rehab OT Goals Patient Stated Goal: to lie right here in the bed until tomorrow  OT Goal Formulation: With patient Time For Goal Achievement: 01/30/19 Potential to Achieve Goals: Good ADL Goals Pt Will Perform Grooming: with supervision;standing Pt Will Perform Lower Body Bathing: with supervision;with adaptive equipment;sit to/from  stand Pt Will Perform Lower Body Dressing: with supervision;with adaptive equipment;sit to/from stand Pt Will Transfer to Toilet: with supervision;ambulating;regular height toilet;grab bars Pt Will Perform Toileting - Clothing Manipulation and hygiene: with supervision;sit to/from stand Pt Will Perform Tub/Shower Transfer: Shower transfer;with min guard assist;ambulating;shower seat;rolling walker  OT Frequency: Min 2X/week   Barriers to D/C:            Co-evaluation              AM-PAC OT "6 Clicks" Daily Activity     Outcome Measure Help from another person eating meals?: None Help from another person taking care of personal grooming?: None Help from another person toileting, which includes using toliet, bedpan, or urinal?: A Little Help from another person bathing (including washing, rinsing, drying)?: A Lot Help from another person to put on and taking off regular upper body clothing?: A Little Help from another person to put on and taking off regular lower body clothing?: A Lot 6 Click Score: 18   End of Session Equipment Utilized During Treatment: Rolling walker;Oxygen Nurse Communication: Mobility status  Activity Tolerance: Patient tolerated treatment well Patient left: in bed;with call bell/phone within reach;with bed alarm set  OT Visit Diagnosis: Unsteadiness on feet (R26.81)                Time: 1517-6160 OT Time Calculation (min): 29 min Charges:  OT General Charges $OT Visit: 1 Visit OT Evaluation $OT Eval Moderate Complexity: 1 Mod OT Treatments $Self Care/Home Management : 8-22 mins  Toby Ayad  Perle Gibbon, OTR/L [place] Pager [phone] Office [place], [place] 01/16/2019, 3:25 PM

## 2019-01-16 NOTE — Progress Notes (Signed)
O2 sats when patient walking with OT on RA dropped to 82%, When sitting in bed O2 on 2.5 L o2 sats back to 92 %.

## 2019-01-16 NOTE — Plan of Care (Signed)
  Problem: Clinical Measurements: Goal: Ability to maintain clinical measurements within normal limits will improve Outcome: Progressing Goal: Respiratory complications will improve Outcome: Progressing Goal: Cardiovascular complication will be avoided Outcome: Progressing   

## 2019-01-16 NOTE — ED Notes (Signed)
Pt in bed resting  

## 2019-01-16 NOTE — H&P (Signed)
History and Physical    Nathan Cunningham WJX:[id number] DOB: 1925/06/18 DOA: 01/15/2019  Referring MD/NP/PA:   PCP: Lucille Passy, MD   Patient coming from:  The patient is coming from home.  At baseline, pt is independent for most of ADL.        Chief Complaint: SOB and leg edema, dysuria  HPI: Nathan Cunningham is a 85 y.o. male with medical history significant of hypertension, hyperlipidemia, diabetes mellitus, anemia, alcohol abuse, OSA on CPAP, myelomonocytic leukemia, atrial flutter, who presents with shortness breath,leg edema, dysuria  Patient states that he has been having shortness breath for more than 2 weeks, which has been progressively getting worse.  He has dry cough, but no chest pain.  Denies fever, chills, runny nose or sore throat.  He has oxygen desaturation to 82% on room air, and has new oxygen requirement in ED.  He also reports bilateral leg edema, and abdominal distention and swelling.  Denies nausea, vomiting, abdominal pain or diarrhea.  Patient reports dysuria, burning on urination and increased urinary frequency recently.  No unilateral weakness.  ED Course: pt was found to have troponin 0.05, BNP 176, negative COVID-19 test, electrolytes renal function okay, positive urinalysis (hazy appearance, moderate amount of leukocyte, rare bacteria, WBC 50), temperature 99, blood pressure 139/67, no tachycardia, chest x-ray showed bilateral mild basilar atelectasis. CTA of chest is negative for PE, but showed possible bronchiolitis. Patient is placed on telemetry bed for observation.   CTA of chest and CT-abdomen/pelvis:  1. No PE identified. Main pulmonary artery is dilated which can be seen in patients with elevated PA pressures 2. Emphysematous changes bilaterally with areas of bronchial wall thickening and tree-in-bud opacity suspicious for infectious bronchiolitis. There are trace bilateral pleural effusions with adjacent atelectasis. 3. Sigmoid diverticulosis  without CT evidence of diverticulitis. 4. Asymmetric bladder wall thickening. Underlying neoplasm is not excluded. 5. Trace amount of free fluid in the patient's pelvis and upper abdomen.  Review of Systems:   General: no fevers, chills, no body weight gain, has fatigue HEENT: no blurry vision, hearing changes or sore throat Respiratory: has dyspnea, coughing, no wheezing CV: no chest pain, no palpitations GI: no nausea, vomiting, abdominal pain, diarrhea, constipation.  Has abdominal distention. GU: has dysuria, burning on urination, increased urinary frequency, no hematuria  Ext: has leg edema Neuro: no unilateral weakness, numbness, or tingling, no vision change or hearing loss Skin: no rash, no skin tear. MSK: No muscle spasm, no deformity, no limitation of range of movement in spin Heme: No easy bruising.  Travel history: No recent long distant travel.  Allergy: No Known Allergies  Past Medical History:  Diagnosis Date   Adenomatous polyp of colon 03/2003   Anemia    Constipation    Diverticulosis    Dizziness    ETOH abuse    binge drinking   Hemorrhoids    Hyperlipidemia    Hypertension    Macular degeneration    Right eye   OSA on CPAP    Type II diabetes mellitus ([place])     Past Surgical History:  Procedure Laterality Date   ANTERIOR CERVICAL DECOMP/DISCECTOMY FUSION     APPENDECTOMY  1935   BACK SURGERY     CATARACT EXTRACTION Left    INGUINAL HERNIA REPAIR  1935   TONSILLECTOMY      Social History:  reports that he has never smoked. He has never used smokeless tobacco. He reports current alcohol use of about 21.0 standard  drinks of alcohol per week. He reports that he does not use drugs.  Family History:  Family History  Problem Relation Age of Onset   Heart disease Father    Stroke Father 37     Prior to Admission medications   Medication Sig Start Date End Date Taking? Authorizing Provider  aspirin 325 MG tablet Take 162 mg by  mouth daily.    [provider]  Chromium Picolinate (RA CHROMIUM PICOLINATE) 400 MCG TABS Take 1 tablet by mouth daily.      [provider]  cyanocobalamin (,VITAMIN B-12,) 1000 MCG/ML injection Inject 1024mcg every 60days IM (every other month) 03/21/14   Lucille Passy, MD  glucose blood (ONE TOUCH ULTRA TEST) test strip CHECK BLOOD SUGAR ONCE DAILY AND AS DIRECTED. 07/20/16   Lucille Passy, MD  JANUVIA 100 MG tablet TAKE 1 TABLET BY MOUTH DAILY 09/27/18   Lucille Passy, MD  KOMBIGLYZE XR 12-998 MG TB24 TAKE 1 TABLET BY MOUTH DAILY 09/27/18   Lucille Passy, MD  Multiple Vitamins-Minerals (EYE-VITES) TABS Take 1 tablet by mouth 2 (two) times daily.      [provider]  pregabalin (LYRICA) 75 MG capsule TAKE ONE CAPSULE BY MOUTH TWICE DAILY. GENERIC EQUIVALENT FOR LYRICA. 12/21/18   Lucille Passy, MD  telmisartan (MICARDIS) 40 MG tablet TAKE 1 TABLET BY MOUTH DAILY. GENERIC EQUIVALENT FOR MICARDIS. 07/20/18   Lucille Passy, MD    Physical Exam: Vitals:   01/16/19 0100 01/16/19 0115 01/16/19 0331 01/16/19 0423  BP: (!) 136/57 138/63 132/66 137/66  Pulse: 69 64 66 70  Resp: 12 13 16  (!) 21  Temp:    98.5 F (36.9 C)  TempSrc:    Oral  SpO2: 95% 96% 95% 97%  Weight:    85.5 kg  Height:    5\' 7"  (1.702 m)   General: Not in acute distress HEENT:       Eyes: PERRL, EOMI, no scleral icterus.       ENT: No discharge from the ears and nose, no pharynx injection, no tonsillar enlargement.        Neck: No JVD, no bruit, no mass felt. Heme: No neck lymph node enlargement. Cardiac: S1/S2, RRR, No murmurs, No gallops or rubs. Respiratory: No rales, wheezing, rhonchi or rubs. GI: Soft, nondistended, nontender, no rebound pain, no organomegaly, BS present. GU: No hematuria Ext: 1+ pitting leg edema bilaterally. 2+DP/PT pulse bilaterally. Musculoskeletal: No joint deformities, No joint redness or warmth, no limitation of ROM in spin. Skin: No rashes.  Neuro: Alert,  oriented X3, cranial nerves II-XII grossly intact, moves all extremities normally.  Psych: Patient is not psychotic, no suicidal or hemocidal ideation.  Labs on Admission: I have personally reviewed following labs and imaging studies  CBC: Recent Labs  Lab 01/15/19 1517  WBC 8.3  HGB 11.7*  HCT 37.3*  MCV 95.9  PLT 400   Basic Metabolic Panel: Recent Labs  Lab 01/15/19 1517  NA 138  K 4.7  CL 100  CO2 31  GLUCOSE 159*  BUN 18  CREATININE 0.94  CALCIUM 8.7*   GFR: Estimated Creatinine Clearance: 53.5 mL/min (by C-G formula based on SCr of 0.94 mg/dL). Liver Function Tests: Recent Labs  Lab 01/15/19 2238  AST 9*  ALT 11  ALKPHOS 54  BILITOT 0.8  PROT 7.4  ALBUMIN 3.2*   No results for input(s): LIPASE, AMYLASE in the last 168 hours. No results for input(s): AMMONIA in the  last 168 hours. Coagulation Profile: Recent Labs  Lab 01/16/19 0313  INR 1.3*   Cardiac Enzymes: Recent Labs  Lab 01/15/19 2238 01/16/19 0313  TROPONINI 0.05* 0.04*   BNP (last 3 results) No results for input(s): PROBNP in the last 8760 hours. HbA1C: Recent Labs    01/16/19 0312  HGBA1C 7.7*   CBG: Recent Labs  Lab 01/16/19 0309  GLUCAP 133*   Lipid Profile: Recent Labs    01/16/19 0312  CHOL 109  HDL 36*  LDLCALC 55  TRIG 91  CHOLHDL 3.0   Thyroid Function Tests: No results for input(s): TSH, T4TOTAL, FREET4, T3FREE, THYROIDAB in the last 72 hours. Anemia Panel: No results for input(s): VITAMINB12, FOLATE, FERRITIN, TIBC, IRON, RETICCTPCT in the last 72 hours. Urine analysis:    Component Value Date/Time   COLORURINE YELLOW 01/15/2019 2240   APPEARANCEUR HAZY (A) 01/15/2019 2240   LABSPEC 1.016 01/15/2019 2240   PHURINE 5.0 01/15/2019 2240   GLUCOSEU NEGATIVE 01/15/2019 2240   HGBUR NEGATIVE 01/15/2019 2240   BILIRUBINUR NEGATIVE 01/15/2019 2240   BILIRUBINUR 1 12/26/2017 1124   KETONESUR 5 (A) 01/15/2019 2240   PROTEINUR 100 (A) 01/15/2019 2240    UROBILINOGEN 0.2 12/26/2017 1124   UROBILINOGEN 0.2 03/18/2008 0800   NITRITE POSITIVE (A) 01/15/2019 2240   LEUKOCYTESUR MODERATE (A) 01/15/2019 2240   Sepsis Labs: @LABRCNTIP (procalcitonin:4,lacticidven:4) ) Recent Results (from the past 240 hour(s))  SARS Coronavirus 2 (CEPHEID- Performed in [place] hospital lab), Hosp Order     Status: None   Collection Time: 01/15/19 11:07 PM  Result Value Ref Range Status   SARS Coronavirus 2 NEGATIVE NEGATIVE Final    Comment: (NOTE) If result is NEGATIVE SARS-CoV-2 target nucleic acids are NOT DETECTED. The SARS-CoV-2 RNA is generally detectable in upper and lower  respiratory specimens during the acute phase of infection. The lowest  concentration of SARS-CoV-2 viral copies this assay can detect is 250  copies / mL. A negative result does not preclude SARS-CoV-2 infection  and should not be used as the sole basis for treatment or other  patient management decisions.  A negative result may occur with  improper specimen collection / handling, submission of specimen other  than nasopharyngeal swab, presence of viral mutation(s) within the  areas targeted by this assay, and inadequate number of viral copies  (<250 copies / mL). A negative result must be combined with clinical  observations, patient history, and epidemiological information. If result is POSITIVE SARS-CoV-2 target nucleic acids are DETECTED. The SARS-CoV-2 RNA is generally detectable in upper and lower  respiratory specimens dur ing the acute phase of infection.  Positive  results are indicative of active infection with SARS-CoV-2.  Clinical  correlation with patient history and other diagnostic information is  necessary to determine patient infection status.  Positive results do  not rule out bacterial infection or co-infection with other viruses. If result is PRESUMPTIVE POSTIVE SARS-CoV-2 nucleic acids MAY BE PRESENT.   A presumptive positive result was obtained on the  submitted specimen  and confirmed on repeat testing.  While 2019 novel coronavirus  (SARS-CoV-2) nucleic acids may be present in the submitted sample  additional confirmatory testing may be necessary for epidemiological  and / or clinical management purposes  to differentiate between  SARS-CoV-2 and other Sarbecovirus currently known to infect humans.  If clinically indicated additional testing with an alternate test  methodology [phone]) is advised. The SARS-CoV-2 RNA is generally  detectable in upper and lower respiratory sp ecimens  during the acute  phase of infection. The expected result is Negative. Fact Sheet for Patients:  StrictlyIdeas.no Fact Sheet for Healthcare Providers: BankingDealers.co.za This test is not yet approved or cleared by the [place] FDA and has been authorized for detection and/or diagnosis of SARS-CoV-2 by FDA under an Emergency Use Authorization (EUA).  This EUA will remain in effect (meaning this test can be used) for the duration of the COVID-19 declaration under Section 564(b)(1) of the Act, 21 U.S.C. section 360bbb-3(b)(1), unless the authorization is terminated or revoked sooner. Performed at [facility], [place] [address] [place], [place] [address]   Respiratory Panel by PCR     Status: None   Collection Time: 01/16/19  4:38 AM  Result Value Ref Range Status   Adenovirus NOT DETECTED NOT DETECTED Final   Coronavirus 229E NOT DETECTED NOT DETECTED Final    Comment: (NOTE) The Coronavirus on the Respiratory Panel, DOES NOT test for the novel  Coronavirus (2019 nCoV)    Coronavirus HKU1 NOT DETECTED NOT DETECTED Final   Coronavirus NL63 NOT DETECTED NOT DETECTED Final   Coronavirus OC43 NOT DETECTED NOT DETECTED Final   Metapneumovirus NOT DETECTED NOT DETECTED Final   Rhinovirus / Enterovirus NOT DETECTED NOT DETECTED Final   Influenza A NOT DETECTED NOT DETECTED Final   Influenza B NOT  DETECTED NOT DETECTED Final   Parainfluenza Virus 1 NOT DETECTED NOT DETECTED Final   Parainfluenza Virus 2 NOT DETECTED NOT DETECTED Final   Parainfluenza Virus 3 NOT DETECTED NOT DETECTED Final   Parainfluenza Virus 4 NOT DETECTED NOT DETECTED Final   Respiratory Syncytial Virus NOT DETECTED NOT DETECTED Final   Bordetella pertussis NOT DETECTED NOT DETECTED Final   Chlamydophila pneumoniae NOT DETECTED NOT DETECTED Final   Mycoplasma pneumoniae NOT DETECTED NOT DETECTED Final    Comment: Performed at [facility], [place]. [address] [place], [place] [address]     Radiological Exams on Admission: Dg Chest 2 View  Result Date: 01/15/2019 CLINICAL DATA:  Shortness of breath. EXAM: CHEST - 2 VIEW COMPARISON:  Radiographs of April 20, 2016. FINDINGS: Stable cardiomediastinal silhouette. No pneumothorax is noted. No significant pleural effusion is noted. Mild bibasilar opacities are noted which may represent subsegmental atelectasis or possibly scarring. Bony thorax is unremarkable. IMPRESSION: Mild bibasilar subsegmental atelectasis or scarring. Electronically Signed   By: Marijo Conception M.D.   On: 01/15/2019 15:57   Ct Angio Chest Pe W And/or Wo Contrast  Result Date: 01/16/2019 CLINICAL DATA:  Shortness of breath. EXAM: CT ANGIOGRAPHY CHEST CT ABDOMEN AND PELVIS WITH CONTRAST TECHNIQUE: Multidetector CT imaging of the chest was performed using the standard protocol during bolus administration of intravenous contrast. Multiplanar CT image reconstructions and MIPs were obtained to evaluate the vascular anatomy. Multidetector CT imaging of the abdomen and pelvis was performed using the standard protocol during bolus administration of intravenous contrast. CONTRAST:  150mL OMNIPAQUE IOHEXOL 350 MG/ML SOLN COMPARISON:  CT dated 05/10/2011. FINDINGS: CTA CHEST FINDINGS Cardiovascular: The heart size is enlarged. The main pulmonary artery is dilated measuring up to approximately 4 cm. Coronary  artery calcifications are noted. There is no PE. Aortic calcifications are noted. Mediastinum/Nodes: Is mild nonspecific enlargement of mediastinal and hilar lymph nodes bilaterally. There are calcified left hilar lymph nodes which are likely related to prior granulomatous infection. Lungs/Pleura: Emphysematous changes are noted bilaterally. There are few tree-in-bud opacities at the right and scattered throughout the left upper lobe. The lung volumes are somewhat  low. There is atelectasis at the lung bases. There are trace bilateral pleural effusions. There is bronchial wall thickening and mucus plugging involving the bilateral lower lobes. Musculoskeletal: There is a heterogeneous appearance of the osseous structures favored to be secondary to heterogeneous osteopenia. Review of the MIP images confirms the above findings. CT ABDOMEN and PELVIS FINDINGS Hepatobiliary: No focal liver abnormality is seen. No gallstones, gallbladder wall thickening, or biliary dilatation. Pancreas: Unremarkable. No pancreatic ductal dilatation or surrounding inflammatory changes. Spleen: Multiple calcifications are noted throughout the spleen likely related to prior granulomatous infection. Adrenals/Urinary Tract: There is extensive retroperitoneal fat displacing the mesentery and small bowel anteriorly. There is no hydronephrosis. There is no evidence of a radiopaque obstructing kidney stone. There is some mild diffuse asymmetric bladder wall thickening. Stomach/Bowel: There is scattered colonic diverticula without definite CT evidence of diverticulitis. The appendix is not reliably identified. The stomach is unremarkable. There is no evidence of a small-bowel obstruction. Vascular/Lymphatic: Aortic atherosclerosis. No enlarged abdominal or pelvic lymph nodes. Reproductive: Prostate is unremarkable. Other: There is free fluid in the pelvis. There is a small fat containing left inguinal hernia. There is a trace amount of free fluid in  the upper abdomen. Musculoskeletal: No acute or significant osseous findings. Review of the MIP images confirms the above findings. IMPRESSION: 1. No PE identified. Main pulmonary artery is dilated which can be seen in patients with elevated PA pressures 2. Emphysematous changes bilaterally with areas of bronchial wall thickening and tree-in-bud opacity suspicious for infectious bronchiolitis. There are trace bilateral pleural effusions with adjacent atelectasis. 3. Sigmoid diverticulosis without CT evidence of diverticulitis. 4. Asymmetric bladder wall thickening. Outpatient urology follow-up is recommended as an underlying neoplasm is not excluded. 5. Trace amount of free fluid in the patient's pelvis and upper abdomen. Aortic Atherosclerosis (ICD10-I70.0) and Emphysema (ICD10-J43.9). Electronically Signed   By: Constance Holster M.D.   On: 01/16/2019 00:25   Ct Abdomen Pelvis W Contrast  Result Date: 01/16/2019 CLINICAL DATA:  Shortness of breath. EXAM: CT ANGIOGRAPHY CHEST CT ABDOMEN AND PELVIS WITH CONTRAST TECHNIQUE: Multidetector CT imaging of the chest was performed using the standard protocol during bolus administration of intravenous contrast. Multiplanar CT image reconstructions and MIPs were obtained to evaluate the vascular anatomy. Multidetector CT imaging of the abdomen and pelvis was performed using the standard protocol during bolus administration of intravenous contrast. CONTRAST:  121mL OMNIPAQUE IOHEXOL 350 MG/ML SOLN COMPARISON:  CT dated 05/10/2011. FINDINGS: CTA CHEST FINDINGS Cardiovascular: The heart size is enlarged. The main pulmonary artery is dilated measuring up to approximately 4 cm. Coronary artery calcifications are noted. There is no PE. Aortic calcifications are noted. Mediastinum/Nodes: Is mild nonspecific enlargement of mediastinal and hilar lymph nodes bilaterally. There are calcified left hilar lymph nodes which are likely related to prior granulomatous infection.  Lungs/Pleura: Emphysematous changes are noted bilaterally. There are few tree-in-bud opacities at the right and scattered throughout the left upper lobe. The lung volumes are somewhat low. There is atelectasis at the lung bases. There are trace bilateral pleural effusions. There is bronchial wall thickening and mucus plugging involving the bilateral lower lobes. Musculoskeletal: There is a heterogeneous appearance of the osseous structures favored to be secondary to heterogeneous osteopenia. Review of the MIP images confirms the above findings. CT ABDOMEN and PELVIS FINDINGS Hepatobiliary: No focal liver abnormality is seen. No gallstones, gallbladder wall thickening, or biliary dilatation. Pancreas: Unremarkable. No pancreatic ductal dilatation or surrounding inflammatory changes. Spleen: Multiple calcifications are noted throughout the spleen  likely related to prior granulomatous infection. Adrenals/Urinary Tract: There is extensive retroperitoneal fat displacing the mesentery and small bowel anteriorly. There is no hydronephrosis. There is no evidence of a radiopaque obstructing kidney stone. There is some mild diffuse asymmetric bladder wall thickening. Stomach/Bowel: There is scattered colonic diverticula without definite CT evidence of diverticulitis. The appendix is not reliably identified. The stomach is unremarkable. There is no evidence of a small-bowel obstruction. Vascular/Lymphatic: Aortic atherosclerosis. No enlarged abdominal or pelvic lymph nodes. Reproductive: Prostate is unremarkable. Other: There is free fluid in the pelvis. There is a small fat containing left inguinal hernia. There is a trace amount of free fluid in the upper abdomen. Musculoskeletal: No acute or significant osseous findings. Review of the MIP images confirms the above findings. IMPRESSION: 1. No PE identified. Main pulmonary artery is dilated which can be seen in patients with elevated PA pressures 2. Emphysematous changes  bilaterally with areas of bronchial wall thickening and tree-in-bud opacity suspicious for infectious bronchiolitis. There are trace bilateral pleural effusions with adjacent atelectasis. 3. Sigmoid diverticulosis without CT evidence of diverticulitis. 4. Asymmetric bladder wall thickening. Outpatient urology follow-up is recommended as an underlying neoplasm is not excluded. 5. Trace amount of free fluid in the patient's pelvis and upper abdomen. Aortic Atherosclerosis (ICD10-I70.0) and Emphysema (ICD10-J43.9). Electronically Signed   By: Constance Holster M.D.   On: 01/16/2019 00:25     EKG: Independently reviewed.  Sinus rhythm, QTC 433, LAE, LAD, low voltage, mild T wave inversion in V3-V V4, poor R wave progression   Assessment/Plan Principal Problem:   Acute respiratory failure with hypoxia (HCC) Active Problems:   Diabetes mellitus with complication (HCC)   HTN (hypertension)   UTI (urinary tract infection)   OSA (obstructive sleep apnea)   Type 2 diabetes mellitus with diabetic neuropathy, unspecified (HCC)   Bronchiolitis   Alcohol abuse   Lower leg edema   Elevated troponin   Acute respiratory failure with hypoxia due to possible bronchiolitis: CTA of chest is negative for PE, but showed possible bronchiolitis. No fever or leukocytosis.  Clinically not septic. COVID-19 negative.  - Will place on telemetry bed for obs. - IV Rocephin and Z pack (Rocephin is also for UTI) - Mucinex for cough  - prn Albuterol Nebs for SOB - S. pneumococcal antigen - Follow up blood culture x2, sputum culture and respiratory virus panel, plus Flu pcr  UTI: -on Rocephin -f/u Bx and Ux  Diabetes mellitus with complication and diabetic neuropathy: Last A1c 7.2 on 10/10/18, not well controled. Patient is taking Januvia, Kombiglyze at home -SSI  HTN (hypertension): -IV hydralazine as needed -Irbesartan  OSA (obstructive sleep apnea): -CPAP  Alcohol abuse: -CIWA protcol  Lower leg edema:  pt has bilateral lower leg edema.  He also has abdominal distention.  Differential diagnosis include CHF and liver cirrhosis given alcohol abuse. CT of abdomen/pelvis only showed trace amount of free fluid in the patient's pelvis and upper abdomen. BNP 176.  Patient had 2D echo on 03/04/2016 which showed EF of 55- 60% with grade 1 diastolic dysfunction. -will get 2d echo -start low dose of lasix 20 mg bid  Elevated troponin: trop 0.05-->0.04. No CP. Likely due to demand ischemia if patient has CHF. - cycle CE q6 x3 and repeat EKG in the am  - aspirin - Risk factor stratification: will check FLP and A1C  - 2d echo  Bladder wall thickening: CT scan showed asymmetric bladder wall thickening. Underlying neoplasm is not excluded. -need referral  to urology as outpt   DVT ppx: SQ Lovenox Code Status: Full code Family Communication: None at bed side.    Disposition Plan:  Anticipate discharge back to previous home environment Consults called:  none Admission status: Obs / tele     Date of Service 01/16/2019    Ivor Costa [facility]   If 7PM-7AM, please contact night-coverage [email or web address] Password [email or web address] 01/16/2019, 6:04 AM

## 2019-01-16 NOTE — Progress Notes (Signed)
Patient refused use of CPAP this evening, informed patient that should he change his mind one will be made available for him to use.

## 2019-01-17 LAB — BASIC METABOLIC PANEL
Anion gap: 5 (ref 5–15)
BUN: 12 mg/dL (ref 8–23)
CO2: 33 mmol/L — ABNORMAL HIGH (ref 22–32)
Calcium: 8.1 mg/dL — ABNORMAL LOW (ref 8.9–10.3)
Chloride: 96 mmol/L — ABNORMAL LOW (ref 98–111)
Creatinine, Ser: 0.76 mg/dL (ref 0.61–1.24)
GFR calc Af Amer: >60 mL/min (ref >60)
GFR calc non Af Amer: >60 mL/min (ref >60)
Glucose, Bld: 143 mg/dL — ABNORMAL HIGH (ref 70–99)
Potassium: 3.9 mmol/L (ref 3.5–5.1)
Sodium: 134 mmol/L — ABNORMAL LOW (ref 135–145)

## 2019-01-17 LAB — GLUCOSE, CAPILLARY
Glucose-Capillary: 154 mg/dL — ABNORMAL HIGH (ref 70–99)
Glucose-Capillary: 211 mg/dL — ABNORMAL HIGH (ref 70–99)
Glucose-Capillary: 313 mg/dL — ABNORMAL HIGH (ref 70–99)
Glucose-Capillary: 322 mg/dL — ABNORMAL HIGH (ref 70–99)

## 2019-01-17 LAB — CBC
HCT: 35.2 % — ABNORMAL LOW (ref 39.0–52.0)
Hemoglobin: 11.1 g/dL — ABNORMAL LOW (ref 13.0–17.0)
MCH: 30.2 pg (ref 26.0–34.0)
MCHC: 31.5 g/dL (ref 30.0–36.0)
MCV: 95.9 fL (ref 80.0–100.0)
Platelets: 141 10*3/uL — ABNORMAL LOW (ref 150–400)
RBC: 3.67 MIL/uL — ABNORMAL LOW (ref 4.22–5.81)
RDW: 14.8 % (ref 11.5–15.5)
WBC: 9.7 10*3/uL (ref 4.0–10.5)
nRBC: 0 % (ref 0.0–0.2)

## 2019-01-17 LAB — URINE CULTURE: Culture: <10000 — AB

## 2019-01-17 LAB — MAGNESIUM: Magnesium: 1.6 mg/dL — ABNORMAL LOW (ref 1.7–2.4)

## 2019-01-17 MED ORDER — IPRATROPIUM-ALBUTEROL 0.5-2.5 (3) MG/3ML IN SOLN
RESPIRATORY_TRACT | Status: AC
  Administered 2019-01-17: 3 mL
  Filled 2019-01-17: qty 3

## 2019-01-17 MED ORDER — IPRATROPIUM-ALBUTEROL 0.5-2.5 (3) MG/3ML IN SOLN: 3.0000 mL | Freq: Four times a day (QID) | RESPIRATORY_TRACT | Status: DC

## 2019-01-17 MED ORDER — METHYLPREDNISOLONE SODIUM SUCC 125 MG IJ SOLR
60.0000 mg | Freq: Two times a day (BID) | INTRAMUSCULAR | Status: DC
  Administered 2019-01-17 – 2019-01-19 (×5): 60 mg via INTRAVENOUS
  Filled 2019-01-17 (×5): qty 2

## 2019-01-17 MED ORDER — IPRATROPIUM-ALBUTEROL 0.5-2.5 (3) MG/3ML IN SOLN
3.0000 mL | Freq: Three times a day (TID) | RESPIRATORY_TRACT | Status: DC
  Administered 2019-01-18 – 2019-01-19 (×4): 3 mL via RESPIRATORY_TRACT
  Filled 2019-01-17 (×6): qty 3

## 2019-01-17 MED ORDER — MAGNESIUM SULFATE 2 GM/50ML IV SOLN
2.0000 g | Freq: Once | INTRAVENOUS | Status: AC
  Administered 2019-01-17: 09:00:00 2 g via INTRAVENOUS
  Filled 2019-01-17: qty 50

## 2019-01-17 NOTE — Progress Notes (Signed)
RT Note:  I introduced myself to patient and explained that I was here to administer his breathing treatment.  Patient stated that he felt that the medication had caused his nose to become stuffy and was really unsure about taking it.  I explained what the medication does, I had never heard of it causing a stuffy nose.  Patient refused medication for now, I explained that if he felt like he was getting SOB or any other issues, just have his RN call me.  Will continue to monitor.

## 2019-01-17 NOTE — Evaluation (Signed)
Physical Therapy Evaluation Patient Details Name: Nathan Cunningham MRN: [id number] DOB: [birth year] Today's Date: 01/17/2019   History of Present Illness  This 85 y.o. male admitted with SOB, dry cough, LE edema and dysuria.  02 sats 82% on RA.   Dx:  Acute respiratory failure wtih hypoxemia, possible pulmonary hypertension, UTI.  PMH includes:  OSA, HTN, NIDDM, alcohol use disorder     Clinical Impression  Patient admitted with the above listed diagnosis. Patient reports ambulatory with RW prior to admission. Patient today requiring Mod A for bed level mobility with overall Min A for OOB mobility with RW. Patient demonstrating poor safety awareness with mobility with patient requiring consistent verbal cueing for safety to prevent falls. Will recommend HHPT and 24/hr supervision at discharge to ensure safety in the home environment.      Follow Up Recommendations Home health PT;Supervision/Assistance - 24 hour    Equipment Recommendations  None recommended by PT    Recommendations for Other Services       Precautions / Restrictions Precautions Precautions: Fall Precaution Comments: Pt denies h/o falls  Restrictions Weight Bearing Restrictions: No      Mobility  Bed Mobility Overal bed mobility: Needs Assistance Bed Mobility: Supine to Sit     Supine to sit: Mod assist     General bed mobility comments: Mod A from seated position in bed  Transfers Overall transfer level: Needs assistance Equipment used: Rolling walker (2 wheeled) Transfers: Sit to/from [facility] Sit to Stand: Min assist Stand pivot transfers: Min assist       General transfer comment: Min A from bedside and toilet - cueing for safety and sequencing; Min A for initial steadying  Ambulation/Gait Ambulation/Gait assistance: Min assist Gait Distance (Feet): (15, 20) Assistive device: Rolling walker (2 wheeled) Gait Pattern/deviations: Step-through pattern;Decreased stride  length;Trunk flexed Gait velocity: decreased   General Gait Details: heavy trunk flexion with cueing for safety with RW - attempts to prematurely sit on toilet and recliner with max cueing to fully turn prior to sitting  Stairs            Wheelchair Mobility    Modified Rankin (Stroke Patients Only)       Balance Overall balance assessment: Needs assistance Sitting-balance support: Bilateral upper extremity supported;Feet supported Sitting balance-Leahy Scale: Fair     Standing balance support: Bilateral upper extremity supported;During functional activity Standing balance-Leahy Scale: Poor Standing balance comment: requires UE support                              Pertinent Vitals/Pain Pain Assessment: No/denies pain    Home Living Family/patient expects to be discharged to:: Private residence Living Arrangements: Spouse/significant other Available Help at Discharge: Family;Available 24 hours/day Type of Home: House Home Access: Ramped entrance     Home Layout: One level Home Equipment: Walker - 4 wheels;Shower seat;Walker - 2 wheels;Adaptive equipment Additional Comments: Pt reports he lives with his significant other who is in good health     Prior Function Level of Independence: Independent with assistive device(s)         Comments: Pt reports he uses AE for LB ADLs and ambulates with RW     Hand Dominance   Dominant Hand: Right    Extremity/Trunk Assessment   Upper Extremity Assessment Upper Extremity Assessment: Defer to OT evaluation    Lower Extremity Assessment Lower Extremity Assessment: Generalized weakness    Cervical / Trunk  Assessment Cervical / Trunk Assessment: Kyphotic  Communication   Communication: No difficulties  Cognition Arousal/Alertness: Awake/alert Behavior During Therapy: WFL for tasks assessed/performed Overall Cognitive Status: No family/caregiver present to determine baseline cognitive functioning                                         General Comments      Exercises     Assessment/Plan    PT Assessment Patient needs continued PT services  PT Problem List Decreased strength;Decreased activity tolerance;Decreased balance;Decreased mobility;Decreased safety awareness       PT Treatment Interventions DME instruction;Gait training;Functional mobility training;Therapeutic activities;Therapeutic exercise;Balance training;Patient/family education    PT Goals (Current goals can be found in the Care Plan section)  Acute Rehab PT Goals Patient Stated Goal: return home PT Goal Formulation: With patient Time For Goal Achievement: 01/31/19 Potential to Achieve Goals: Good    Frequency Min 3X/week   Barriers to discharge        Co-evaluation               AM-PAC PT "6 Clicks" Mobility  Outcome Measure Help needed turning from your back to your side while in a flat bed without using bedrails?: A Little Help needed moving from lying on your back to sitting on the side of a flat bed without using bedrails?: A Little Help needed moving to and from a bed to a chair (including a wheelchair)?: A Little Help needed standing up from a chair using your arms (e.g., wheelchair or bedside chair)?: A Little Help needed to walk in hospital room?: A Little Help needed climbing 3-5 steps with a railing? : A Lot 6 Click Score: 17    End of Session Equipment Utilized During Treatment: Gait belt;Oxygen Activity Tolerance: Patient tolerated treatment well Patient left: in chair;with call bell/phone within reach;with chair alarm set Nurse Communication: Mobility status PT Visit Diagnosis: Unsteadiness on feet (R26.81);Other abnormalities of gait and mobility (R26.89);Muscle weakness (generalized) (M62.81)    Time: 4562-5638 PT Time Calculation (min) (ACUTE ONLY): 40 min   Charges:   PT Evaluation $PT Eval Moderate Complexity: 1 Mod PT Treatments $Gait Training: 8-22  mins       Lanney Gins, PT, DPT Supplemental Physical Therapist 01/17/19 1:28 PM Pager: [phone] Office: [phone]

## 2019-01-17 NOTE — Progress Notes (Signed)
PROGRESS NOTE  Nathan Cunningham DHR:[id number] DOB: August 12, 1924 DOA: 01/15/2019 PCP: Lucille Passy, MD   LOS: 1 day   Patient is from: Home.  Can ambulate at baseline using walker.  Brief Narrative / Interim history: Nathan Cunningham is a 85 y.o. male with medical history significant of hypertension, hyperlipidemia, diabetes mellitus, anemia, alcohol abuse, OSA on CPAP, myelomonocytic leukemia, atrial flutter, who presents with progressive shortness breath, dry cough, leg edema and dysuria.  Reportedly desaturated to 82% on room air.   In ED, hemodynamically stable. Troponin 0.05, BNP 176, negative COVID-19 test, BMP not impressive. Urinalysis concerning for UTI. Chest x-ray showed bilateral mild basilar atelectasis. CTA chest/abdomen/pelvis negative for PE but dilated PA, emphysematous changes, bronchial wall thickening and tree-in-bud opacities suspicious for infectious bronchiolitis, trace bilateral pleural effusion, sigmoid diverticulosis without diverticulitis and symmetric bladder wall thickening.  Patient was admitted for possible CAP and UTI.  Subjective: No major events overnight of this morning.  Denies chest pain, dyspnea orthopnea although he is on 4 L by nasal cannula.  No GI or GU symptoms this morning.  Assessment & Plan: Acute respiratory failure with hypoxemia: PE excluded by CTA.  Some concern about bronchiolitis and pulmonary hypertension based on CTA.  Also significant emphysematous change on CTA.  Formal diagnosis of COPD.  Never smoker.  COVID-19, RVP and blood cultures negative.  Sputum culture pending.  Desaturated to 82% with ambulation yesterday.  Continues to require high level of oxygen.  -Add systemic steroid and scheduled DuoNeb -Continue Rocephin and Z-Pak.  Rocephin is mainly to cover UTI -Mucolytic's -Continue furosemide 20 mg twice daily -Follow cultures sputum culture -We will continue weaning oxygen and ambulating daily -HH PT/OT on discharge   Elevated troponin: Non-ACS pattern flat troponin elevation likely demand ischemia.  EKG reassuring.  Echo with EF of 60 to 46%, diastolic dysfunction and trivial pericardial effusion but no other significant structural functional abnormalities.  Good response to IV Lasix with -2 L so far. -Continue IV Lasix 20 mg daily -Daily weight, intake output and renal function. -Monitor electrolytes and replenish aggressively.  UTI: CT abdomen and pelvis showed a symmetric bladder wall thickening which could be related to UTI but cannot exclude neoplasm.  Urine culture with insignificant growth. -We will continue treating UTI clinically. -Need outpatient urology referral to exclude malignancy.  Fairly controlled NIDDM-2: A1c 7.2% on 10/10/2018.  On Januvia and Kombiglyze at home -Hold home medication -SSI and CBG monitoring -Recheck A1c  Hypertension: Normotensive -Continue home meds  OSA -Nightly CPAP  Alcohol use disorder -CIWA protocol  Debility: able to ambulate with walker at home -[facility] PT/OT on discharge  Scheduled Meds: . diphenhydrAMINE  25 mg Oral QHS   And  . acetaminophen  500 mg Oral QHS  . aspirin EC  81 mg Oral Daily  . azithromycin  250 mg Oral Daily  . brimonidine  1 drop Both Eyes Q12H   And  . timolol  1 drop Both Eyes Q12H  . enoxaparin (LOVENOX) injection  40 mg Subcutaneous Q24H  . folic acid  1 mg Oral Daily  . furosemide  20 mg Intravenous BID  . insulin aspart  0-5 Units Subcutaneous QHS  . insulin aspart  0-9 Units Subcutaneous TID WC  . ipratropium-albuterol  3 mL Nebulization TID  . irbesartan  150 mg Oral Daily  . LORazepam  0-4 mg Intravenous Q6H   Followed by  . [START ON 01/18/2019] LORazepam  0-4 mg Intravenous Q12H  . methylPREDNISolone (  SOLU-MEDROL) injection  60 mg Intravenous Q12H  . multivitamin  1 tablet Oral BID  . multivitamin with minerals  1 tablet Oral Daily  . pregabalin  75 mg Oral TID  . thiamine  100 mg Oral Daily   Or  . thiamine   100 mg Intravenous Daily   Continuous Infusions: . cefTRIAXone (ROCEPHIN)  IV Stopped (01/16/19 2148)   PRN Meds:.acetaminophen, albuterol, dextromethorphan-guaiFENesin, hydrALAZINE, LORazepam **OR** LORazepam, ondansetron (ZOFRAN) IV   DVT prophylaxis: Subcu Lovenox Code Status: Full code Family Communication: Updated significant other over the phone. Disposition Plan: Remains inpatient on IV antibiotics, IV Lasix and IV steroid for respiratory failure.  Still with high oxygen requirement.   Consultants:   None  Procedures:   None  Microbiology: . COVID-19 negative. . RVP negative. . Blood culture negative so far . Sputum culture pending.   . Urine culture with insignificant growth.  Antimicrobials: Anti-infectives (From admission, onward)   Start     Dose/Rate Route Frequency Ordered Stop   01/17/19 1000  azithromycin (ZITHROMAX) tablet 250 mg     250 mg Oral Daily 01/16/19 0137 01/21/19 0959   01/16/19 2200  cefTRIAXone (ROCEPHIN) 1 g in sodium chloride 0.9 % 100 mL IVPB     1 g 200 mL/hr over 30 Minutes Intravenous Every 24 hours 01/16/19 0131     01/16/19 1000  azithromycin (ZITHROMAX) tablet 500 mg     500 mg Oral Daily 01/16/19 0137 01/16/19 0928   01/15/19 2345  cefTRIAXone (ROCEPHIN) 1 g in sodium chloride 0.9 % 100 mL IVPB     1 g 200 mL/hr over 30 Minutes Intravenous  Once 01/15/19 2334 01/16/19 0038      Objective: Vitals:   01/17/19 0443 01/17/19 0820 01/17/19 0902 01/17/19 1126  BP:      Pulse:      Resp:      Temp:  98.4 F (36.9 C)  98.4 F (36.9 C)  TempSrc:  Oral  Oral  SpO2:   93%   Weight: 85.1 kg     Height:        Intake/Output Summary (Last 24 hours) at 01/17/2019 1245 Last data filed at 01/17/2019 1241 Gross per 24 hour  Intake 517 ml  Output -  Net 517 ml   Filed Weights   01/15/19 1512 01/16/19 0423 01/17/19 0443  Weight: 81.6 kg 85.5 kg 85.1 kg    Examination:  GENERAL: No acute distress.  Appears well.  HEENT: MMM.   Vision and hearing grossly intact.  NECK: Supple.  No apparent JVD but hepatojugular reflux LUNGS:  No IWOB.  Saturating in lower 90s to mid 90s on 4 L by nasal.  Fair air movement HEART:  RRR. Heart sounds normal.  No apparent JVD.  1+ edema bilaterally. ABD: Bowel sounds present. Soft. Non tender.  MSK/EXT:  Moves all extremities. No apparent deformity.  1+ edema bilaterally. SKIN: no apparent skin lesion or wound NEURO: Awake, alert and oriented appropriately.  No gross deficit.  PSYCH: Calm. Normal affect.  Data Reviewed: I have independently reviewed following labs and imaging studies  CBC: Recent Labs  Lab 01/15/19 1517 01/17/19 0238  WBC 8.3 9.7  HGB 11.7* 11.1*  HCT 37.3* 35.2*  MCV 95.9 95.9  PLT 157 970*   Basic Metabolic Panel: Recent Labs  Lab 01/15/19 1517 01/17/19 0238  NA 138 134*  K 4.7 3.9  CL 100 96*  CO2 31 33*  GLUCOSE 159* 143*  BUN 18 12  CREATININE 0.94 0.76  CALCIUM 8.7* 8.1*  MG  --  1.6*   GFR: Estimated Creatinine Clearance: 62.7 mL/min (by C-G formula based on SCr of 0.76 mg/dL). Liver Function Tests: Recent Labs  Lab 01/15/19 2238  AST 9*  ALT 11  ALKPHOS 54  BILITOT 0.8  PROT 7.4  ALBUMIN 3.2*   No results for input(s): LIPASE, AMYLASE in the last 168 hours. No results for input(s): AMMONIA in the last 168 hours. Coagulation Profile: Recent Labs  Lab 01/16/19 0313  INR 1.3*   Cardiac Enzymes: Recent Labs  Lab 01/15/19 2238 01/16/19 0313 01/16/19 0715 01/16/19 1245  TROPONINI 0.05* 0.04* 0.04* 0.04*   BNP (last 3 results) No results for input(s): PROBNP in the last 8760 hours. HbA1C: Recent Labs    01/16/19 0312  HGBA1C 7.7*   CBG: Recent Labs  Lab 01/16/19 1201 01/16/19 1632 01/16/19 2138 01/17/19 0616 01/17/19 1125  GLUCAP 111* 136* 143* 154* 211*   Lipid Profile: Recent Labs    01/16/19 0312  CHOL 109  HDL 36*  LDLCALC 55  TRIG 91  CHOLHDL 3.0   Thyroid Function Tests: No results for  input(s): TSH, T4TOTAL, FREET4, T3FREE, THYROIDAB in the last 72 hours. Anemia Panel: No results for input(s): VITAMINB12, FOLATE, FERRITIN, TIBC, IRON, RETICCTPCT in the last 72 hours. Urine analysis:    Component Value Date/Time   COLORURINE YELLOW 01/15/2019 2240   APPEARANCEUR HAZY (A) 01/15/2019 2240   LABSPEC 1.016 01/15/2019 2240   PHURINE 5.0 01/15/2019 2240   GLUCOSEU NEGATIVE 01/15/2019 2240   HGBUR NEGATIVE 01/15/2019 2240   BILIRUBINUR NEGATIVE 01/15/2019 2240   BILIRUBINUR 1 12/26/2017 1124   KETONESUR 5 (A) 01/15/2019 2240   PROTEINUR 100 (A) 01/15/2019 2240   UROBILINOGEN 0.2 12/26/2017 1124   UROBILINOGEN 0.2 03/18/2008 0800   NITRITE POSITIVE (A) 01/15/2019 2240   LEUKOCYTESUR MODERATE (A) 01/15/2019 2240   Sepsis Labs: Invalid input(s): PROCALCITONIN, LACTICIDVEN  Recent Results (from the past 240 hour(s))  SARS Coronavirus 2 (CEPHEID- Performed in [facility] hospital lab), Hosp Order     Status: None   Collection Time: 01/15/19 11:07 PM  Result Value Ref Range Status   SARS Coronavirus 2 NEGATIVE NEGATIVE Final    Comment: (NOTE) If result is NEGATIVE SARS-CoV-2 target nucleic acids are NOT DETECTED. The SARS-CoV-2 RNA is generally detectable in upper and lower  respiratory specimens during the acute phase of infection. The lowest  concentration of SARS-CoV-2 viral copies this assay can detect is 250  copies / mL. A negative result does not preclude SARS-CoV-2 infection  and should not be used as the sole basis for treatment or other  patient management decisions.  A negative result may occur with  improper specimen collection / handling, submission of specimen other  than nasopharyngeal swab, presence of viral mutation(s) within the  areas targeted by this assay, and inadequate number of viral copies  (<250 copies / mL). A negative result must be combined with clinical  observations, patient history, and epidemiological information. If result is  POSITIVE SARS-CoV-2 target nucleic acids are DETECTED. The SARS-CoV-2 RNA is generally detectable in upper and lower  respiratory specimens dur ing the acute phase of infection.  Positive  results are indicative of active infection with SARS-CoV-2.  Clinical  correlation with patient history and other diagnostic information is  necessary to determine patient infection status.  Positive results do  not rule out bacterial infection or co-infection with other viruses. If result is PRESUMPTIVE POSTIVE SARS-CoV-2  nucleic acids MAY BE PRESENT.   A presumptive positive result was obtained on the submitted specimen  and confirmed on repeat testing.  While 2019 novel coronavirus  (SARS-CoV-2) nucleic acids may be present in the submitted sample  additional confirmatory testing may be necessary for epidemiological  and / or clinical management purposes  to differentiate between  SARS-CoV-2 and other Sarbecovirus currently known to infect humans.  If clinically indicated additional testing with an alternate test  methodology [phone]) is advised. The SARS-CoV-2 RNA is generally  detectable in upper and lower respiratory sp ecimens during the acute  phase of infection. The expected result is Negative. Fact Sheet for Patients:  StrictlyIdeas.no Fact Sheet for Healthcare Providers: BankingDealers.co.za This test is not yet approved or cleared by the [place] FDA and has been authorized for detection and/or diagnosis of SARS-CoV-2 by FDA under an Emergency Use Authorization (EUA).  This EUA will remain in effect (meaning this test can be used) for the duration of the COVID-19 declaration under Section 564(b)(1) of the Act, 21 U.S.C. section 360bbb-3(b)(1), unless the authorization is terminated or revoked sooner. Performed at [facility], [place] [address] [place], [place] [address]   Urine Culture     Status: Abnormal   Collection Time:  01/16/19  1:37 AM  Result Value Ref Range Status   Specimen Description URINE, RANDOM  Final   Special Requests NONE  Final   Culture (A)  Final    <10,000 COLONIES/mL INSIGNIFICANT GROWTH Performed at [facility], [place] [address], [place], [place] [address]    Report Status 01/17/2019 FINAL  Final  Culture, blood (Routine X 2) w Reflex to ID Panel     Status: None (Preliminary result)   Collection Time: 01/16/19  3:10 AM  Result Value Ref Range Status   Specimen Description BLOOD LEFT ARM  Final   Special Requests   Final    BOTTLES DRAWN AEROBIC AND ANAEROBIC Blood Culture adequate volume   Culture   Final    NO GROWTH 1 DAY Performed at [facility], [place] [address], [place], [place] [address]    Report Status PENDING  Incomplete  Culture, blood (Routine X 2) w Reflex to ID Panel     Status: None (Preliminary result)   Collection Time: 01/16/19  3:13 AM  Result Value Ref Range Status   Specimen Description BLOOD RIGHT FOREARM  Final   Special Requests   Final    BOTTLES DRAWN AEROBIC AND ANAEROBIC Blood Culture adequate volume   Culture   Final    NO GROWTH 1 DAY Performed at [facility], [place] [address] [place], [place] [address]    Report Status PENDING  Incomplete  MRSA PCR Screening     Status: None   Collection Time: 01/16/19  4:25 AM  Result Value Ref Range Status   MRSA by PCR NEGATIVE NEGATIVE Final    Comment:        The GeneXpert MRSA Assay (FDA approved for NASAL specimens only), is one component of a comprehensive MRSA colonization surveillance program. It is not intended to diagnose MRSA infection nor to guide or monitor treatment for MRSA infections. Performed at [facility], [place] [address] [place],  [address]   Respiratory Panel by PCR     Status: None   Collection Time: 01/16/19  4:38 AM  Result Value Ref Range Status   Adenovirus NOT DETECTED NOT DETECTED Final   Coronavirus 229E  NOT DETECTED NOT DETECTED Final     Comment: (NOTE) The Coronavirus on the Respiratory Panel, DOES NOT test for the novel  Coronavirus (2019 nCoV)    Coronavirus HKU1 NOT DETECTED NOT DETECTED Final   Coronavirus NL63 NOT DETECTED NOT DETECTED Final   Coronavirus OC43 NOT DETECTED NOT DETECTED Final   Metapneumovirus NOT DETECTED NOT DETECTED Final   Rhinovirus / Enterovirus NOT DETECTED NOT DETECTED Final   Influenza A NOT DETECTED NOT DETECTED Final   Influenza B NOT DETECTED NOT DETECTED Final   Parainfluenza Virus 1 NOT DETECTED NOT DETECTED Final   Parainfluenza Virus 2 NOT DETECTED NOT DETECTED Final   Parainfluenza Virus 3 NOT DETECTED NOT DETECTED Final   Parainfluenza Virus 4 NOT DETECTED NOT DETECTED Final   Respiratory Syncytial Virus NOT DETECTED NOT DETECTED Final   Bordetella pertussis NOT DETECTED NOT DETECTED Final   Chlamydophila pneumoniae NOT DETECTED NOT DETECTED Final   Mycoplasma pneumoniae NOT DETECTED NOT DETECTED Final    Comment: Performed at [facility], [place] [address], [place], [place] [address]      Radiology Studies: No results found.   T. [facility] Pager [phone]  If 7PM-7AM, please contact night-coverage [email or web address] Password [email or web address] 01/17/2019, 12:45 PM

## 2019-01-17 NOTE — Progress Notes (Signed)
RT Note:  Patient refused CPAP. Patient no in any respiratory distress at this time.  Will continue to monitor.

## 2019-01-18 DIAGNOSIS — F1010 Alcohol abuse, uncomplicated: Secondary | ICD-10-CM

## 2019-01-18 LAB — GLUCOSE, CAPILLARY
Glucose-Capillary: 380 mg/dL — ABNORMAL HIGH (ref 70–99)
Glucose-Capillary: 288 mg/dL — ABNORMAL HIGH (ref 70–99)
Glucose-Capillary: 267 mg/dL — ABNORMAL HIGH (ref 70–99)
Glucose-Capillary: 226 mg/dL — ABNORMAL HIGH (ref 70–99)

## 2019-01-18 LAB — CBC
HCT: 37.3 % — ABNORMAL LOW (ref 39.0–52.0)
Hemoglobin: 12.1 g/dL — ABNORMAL LOW (ref 13.0–17.0)
MCH: 30.3 pg (ref 26.0–34.0)
MCHC: 32.4 g/dL (ref 30.0–36.0)
MCV: 93.3 fL (ref 80.0–100.0)
Platelets: 138 10*3/uL — ABNORMAL LOW (ref 150–400)
RBC: 4 MIL/uL — ABNORMAL LOW (ref 4.22–5.81)
RDW: 14.5 % (ref 11.5–15.5)
WBC: 6.9 10*3/uL (ref 4.0–10.5)
nRBC: 0 % (ref 0.0–0.2)

## 2019-01-18 LAB — BASIC METABOLIC PANEL
Anion gap: 9 (ref 5–15)
BUN: 16 mg/dL (ref 8–23)
CO2: 31 mmol/L (ref 22–32)
Calcium: 8.6 mg/dL — ABNORMAL LOW (ref 8.9–10.3)
Chloride: 92 mmol/L — ABNORMAL LOW (ref 98–111)
Creatinine, Ser: 0.72 mg/dL (ref 0.61–1.24)
GFR calc Af Amer: >60 mL/min (ref >60)
GFR calc non Af Amer: >60 mL/min (ref >60)
Glucose, Bld: 245 mg/dL — ABNORMAL HIGH (ref 70–99)
Potassium: 4.4 mmol/L (ref 3.5–5.1)
Sodium: 132 mmol/L — ABNORMAL LOW (ref 135–145)

## 2019-01-18 LAB — MAGNESIUM: Magnesium: 2 mg/dL (ref 1.7–2.4)

## 2019-01-18 MED ORDER — INSULIN ASPART 100 UNIT/ML ~~LOC~~ SOLN
3.0000 [IU] | Freq: Three times a day (TID) | SUBCUTANEOUS | Status: DC
  Administered 2019-01-18 – 2019-01-19 (×5): 3 [IU] via SUBCUTANEOUS

## 2019-01-18 MED ORDER — SPIRONOLACTONE 12.5 MG HALF TABLET
12.5000 mg | ORAL_TABLET | Freq: Every day | ORAL | Status: DC
  Administered 2019-01-18 – 2019-01-19 (×2): 12.5 mg via ORAL
  Filled 2019-01-18 (×3): qty 1

## 2019-01-18 MED ORDER — FUROSEMIDE 10 MG/ML IJ SOLN
40.0000 mg | Freq: Two times a day (BID) | INTRAMUSCULAR | Status: DC
  Administered 2019-01-18 – 2019-01-21 (×6): 40 mg via INTRAVENOUS
  Filled 2019-01-18 (×6): qty 4

## 2019-01-18 MED ORDER — SALINE SPRAY 0.65 % NA SOLN
1.0000 | NASAL | Status: DC | PRN
  Administered 2019-01-18: 1 via NASAL
  Filled 2019-01-18: qty 44

## 2019-01-18 MED ORDER — POTASSIUM CHLORIDE CRYS ER 20 MEQ PO TBCR
40.0000 meq | EXTENDED_RELEASE_TABLET | Freq: Once | ORAL | Status: AC
  Administered 2019-01-18: 40 meq via ORAL
  Filled 2019-01-18: qty 2

## 2019-01-18 MED ORDER — MAGNESIUM OXIDE 400 (241.3 MG) MG PO TABS
400.0000 mg | ORAL_TABLET | Freq: Two times a day (BID) | ORAL | Status: DC
  Administered 2019-01-18 – 2019-01-22 (×9): 400 mg via ORAL
  Filled 2019-01-18 (×9): qty 1

## 2019-01-18 MED ORDER — WHITE PETROLATUM EX OINT
TOPICAL_OINTMENT | CUTANEOUS | Status: AC
  Administered 2019-01-18: 0.2
  Filled 2019-01-18: qty 28.35

## 2019-01-18 MED ORDER — INSULIN DETEMIR 100 UNIT/ML ~~LOC~~ SOLN
8.0000 [IU] | Freq: Two times a day (BID) | SUBCUTANEOUS | Status: DC
  Administered 2019-01-18 – 2019-01-22 (×9): 8 [IU] via SUBCUTANEOUS
  Filled 2019-01-18 (×10): qty 0.08

## 2019-01-18 NOTE — Plan of Care (Signed)

## 2019-01-18 NOTE — Progress Notes (Signed)
Inpatient Diabetes Program Recommendations  AACE/ADA: New Consensus Statement on Inpatient Glycemic Control (2015)  Target Ranges:  Prepandial:   less than 140 mg/dL      Peak postprandial:   less than 180 mg/dL (1-2 hours)      Critically ill patients:  140 - 180 mg/dL   Lab Results  Component Value Date   GLUCAP 380 (H) 01/18/2019   HGBA1C 7.7 (H) 01/16/2019    Review of Glycemic Control Results for DEKKER, VERGA (MRN [id number]) as of 01/18/2019 10:15  Ref. Range 01/17/2019 06:16 01/17/2019 11:25 01/17/2019 16:09 01/17/2019 21:45 01/18/2019 08:30  Glucose-Capillary Latest Ref Range: 70 - 99 mg/dL 154 (H) 211 (H) 313 (H) 322 (H) 380 (H)   Diabetes history: DM 2 Outpatient Diabetes medications:  Januvia 100 mg daily, Kombliglyze XR 12-998 mg daily Current orders for Inpatient glycemic control: Novolog sensitive tid with meals and HS Solumedrol 60 mg IV q 12 hours Inpatient Diabetes Program Recommendations:    While on steroids, please add Levemir 8 units bid.  Also consider adding Novolog meal coverage 3 units tid w/ meals.   Thanks,  Adah Perl, RN, BC-ADM Inpatient Diabetes Coordinator Pager [phone] (8a-5p)

## 2019-01-18 NOTE — Progress Notes (Signed)
RT Note:  Patient refusing CPAP.  In no current distress at this time.

## 2019-01-18 NOTE — Progress Notes (Addendum)
PROGRESS NOTE  Nathan Cunningham IRJ:[id number] DOB: Mar 01, 1925 DOA: 01/15/2019 PCP: Lucille Passy, MD   LOS: 2 days   Brief narrative: Nathan Penner Willinghamis a 85 y.o.malewith medical history significant ofhypertension, hyperlipidemia, diabetes mellitus, anemia, alcohol abuse, OSA on CPAP, myelomonocytic leukemia, atrial flutter, who presented to the hospital with progressive shortness breath, dry cough, leg edema and dysuria.  Reportedly desaturated to 82% on room air. In ED, hemodynamically stable. Troponin 0.05, BNP 176, negative COVID-19 test, BMP not impressive. Urinalysis concerning for UTI. Chest x-ray showed bilateral mild basilar atelectasis.CTA chest/abdomen/pelvis negative for PE but dilated PA, emphysematous changes, bronchial wall thickening and tree-in-bud opacities suspicious for infectious bronchiolitis, trace bilateral pleural effusion, sigmoid diverticulosis without diverticulitis and symmetric bladder wall thickening. Patient was admitted for possible CAP and UTI.  Subjective: Patient feels overall better.  Denies chest pain, sputum production fever or chills but is still requiring significant amount of oxygen.  Denies nausea, vomiting or diarrhea.  Assessment/Plan:  Principal Problem:   Acute respiratory failure with hypoxia (HCC) Active Problems:   Diabetes mellitus with complication (HCC)   HTN (hypertension)   UTI (urinary tract infection)   OSA (obstructive sleep apnea)   Type 2 diabetes mellitus with diabetic neuropathy, unspecified (HCC)   Bronchiolitis   Alcohol abuse   Lower leg edema   Elevated troponin   Acute respiratory failure (HCC)  Acute hypoxemic respiratory failure likely secondary to COPD,bronchiolitis and pulmonary hypertension.  PE excluded by CTA.     Never smoker.  COVID-19, RVP, influenza and blood cultures negative.  Sputum culture pending.    Patient has been titrated up on the oxygen to 5 L/min and saturating around 89-90%.  We will  continue to wean oxygen as possible.  PT has recommended home PT on discharge.  Streptococcal urinary antigen was negative.  No leukocytosis.  Currently on Solu-Medrol 60 mg IV every 12.  We will continue with that for now.  Elevated troponin: Non-ACS pattern flat troponin elevation likely demand ischemia.  EKG was nonischemic.Marland Kitchen  Echo with EF of 60 to 30%, diastolic dysfunction and trivial pericardial effusion but no other significant structural functional abnormalities.  on  IV Lasix with negative 1287 ml  so far.  so far. We will increase the dose of the IV Lasix twice a day.  Will consider standing weights., intake output and renal function. Monitor electrolytes and replenish aggressively.  UTI: CT abdomen and pelvis showed a symmetric bladder wall thickening which could be related to UTI but cannot exclude neoplasm.  Urine culture with insignificant growth. Continue Rocephin IV.  Patient will need outpatient urology referral to exclude malignancy.   NIDDM-2: A1c 7.2% on 10/10/2018.  On Januvia and Kombiglyze at home.  We will continue to hold OHAs.  Continue sliding scale insulin, add lantus and novolog today due to hyperglycemia on steroids. Continue to monitor Accu-Cheks diabetic diet.  A1c on 01/16/2019 was 7.7.  Mild hypomagnesemia.  Will replace orally.  History of alcohol abuse.  No withdrawal symptoms at this time.  He does have a mild ascites and peripheral edema possibly secondary to diastolic heart failure.  Continue with IV diuresis.  Add low-dose spironolactone.  VTE Prophylaxis: Lovenox  Code Status: Full code  Family Communication: None  Disposition Plan: Home with home health likely in 2 to 3 days   Consultants: None  Procedures:  None  Antibiotics: Anti-infectives (From admission, onward)   Start     Dose/Rate Route Frequency Ordered Stop   01/17/19 1000  azithromycin [facility]) tablet 250 mg     250 mg Oral Daily 01/16/19 0137 01/21/19 0959   01/16/19 2200   cefTRIAXone (ROCEPHIN) 1 g in sodium chloride 0.9 % 100 mL IVPB     1 g 200 mL/hr over 30 Minutes Intravenous Every 24 hours 01/16/19 0131     01/16/19 1000  azithromycin (ZITHROMAX) tablet 500 mg     500 mg Oral Daily 01/16/19 0137 01/16/19 0928   01/15/19 2345  cefTRIAXone (ROCEPHIN) 1 g in sodium chloride 0.9 % 100 mL IVPB     1 g 200 mL/hr over 30 Minutes Intravenous  Once 01/15/19 2334 01/16/19 0038     Objective: Vitals:   01/18/19 0731 01/18/19 0909  BP: (!) 141/65   Pulse: 88   Resp:    Temp: 97.7 F (36.5 C)   SpO2: (!) 89% 92%    Intake/Output Summary (Last 24 hours) at 01/18/2019 0946 Last data filed at 01/18/2019 0745 Gross per 24 hour  Intake 633 ml  Output 350 ml  Net 283 ml   Filed Weights   01/16/19 0423 01/17/19 0443 01/18/19 0337  Weight: 85.5 kg 85.1 kg 84.4 kg   Body mass index is 29.14 kg/m.   Physical Exam: GENERAL: Patient is alert awake and oriented. Not in obvious distress. HENT: No scleral pallor or icterus. Pupils equally reactive to light. Oral mucosa is moist NECK: is supple, no palpable thyroid enlargement. CHEST:Diminished breath sounds bilaterally. CVS: S1 and S2 heard, no murmur. Regular rate and rhythm. No pericardial rub. ABDOMEN: Soft, non-tender, bowel sounds are present.  Mildly distended abdomen with ascites. No palpable hepato-splenomegaly. EXTREMITIES: Bilateral lower extremity pitting edema noted. CNS: Cranial nerves are intact. No focal motor or sensory deficits. SKIN: warm and dry without rashes.  Data Review: I have personally reviewed the following laboratory data and studies,  CBC: Recent Labs  Lab 01/15/19 1517 01/17/19 0238 01/18/19 0241  WBC 8.3 9.7 6.9  HGB 11.7* 11.1* 12.1*  HCT 37.3* 35.2* 37.3*  MCV 95.9 95.9 93.3  PLT 157 141* 537*   Basic Metabolic Panel: Recent Labs  Lab 01/15/19 1517 01/17/19 0238 01/18/19 0241  NA 138 134* 132*  K 4.7 3.9 4.4  CL 100 96* 92*  CO2 31 33* 31  GLUCOSE 159*  143* 245*  BUN 18 12 16   CREATININE 0.94 0.76 0.72  CALCIUM 8.7* 8.1* 8.6*  MG  --  1.6* 2.0   Liver Function Tests: Recent Labs  Lab 01/15/19 2238  AST 9*  ALT 11  ALKPHOS 54  BILITOT 0.8  PROT 7.4  ALBUMIN 3.2*   No results for input(s): LIPASE, AMYLASE in the last 168 hours. No results for input(s): AMMONIA in the last 168 hours. Cardiac Enzymes: Recent Labs  Lab 01/15/19 2238 01/16/19 0313 01/16/19 0715 01/16/19 1245  TROPONINI 0.05* 0.04* 0.04* 0.04*   BNP (last 3 results) Recent Labs    01/15/19 1517  BNP 176.0*    ProBNP (last 3 results) No results for input(s): PROBNP in the last 8760 hours.  CBG: Recent Labs  Lab 01/17/19 0616 01/17/19 1125 01/17/19 1609 01/17/19 2145 01/18/19 0830  GLUCAP 154* 211* 313* 322* 380*   Recent Results (from the past 240 hour(s))  SARS Coronavirus 2 (CEPHEID- Performed in [place] hospital lab), Hosp Order     Status: None   Collection Time: 01/15/19 11:07 PM   Specimen: Nasopharyngeal Swab  Result Value Ref Range Status   SARS Coronavirus 2 NEGATIVE NEGATIVE Final  Comment: (NOTE) If result is NEGATIVE SARS-CoV-2 target nucleic acids are NOT DETECTED. The SARS-CoV-2 RNA is generally detectable in upper and lower  respiratory specimens during the acute phase of infection. The lowest  concentration of SARS-CoV-2 viral copies this assay can detect is 250  copies / mL. A negative result does not preclude SARS-CoV-2 infection  and should not be used as the sole basis for treatment or other  patient management decisions.  A negative result may occur with  improper specimen collection / handling, submission of specimen other  than nasopharyngeal swab, presence of viral mutation(s) within the  areas targeted by this assay, and inadequate number of viral copies  (<250 copies / mL). A negative result must be combined with clinical  observations, patient history, and epidemiological information. If result is POSITIVE  SARS-CoV-2 target nucleic acids are DETECTED. The SARS-CoV-2 RNA is generally detectable in upper and lower  respiratory specimens dur ing the acute phase of infection.  Positive  results are indicative of active infection with SARS-CoV-2.  Clinical  correlation with patient history and other diagnostic information is  necessary to determine patient infection status.  Positive results do  not rule out bacterial infection or co-infection with other viruses. If result is PRESUMPTIVE POSTIVE SARS-CoV-2 nucleic acids MAY BE PRESENT.   A presumptive positive result was obtained on the submitted specimen  and confirmed on repeat testing.  While 2019 novel coronavirus  (SARS-CoV-2) nucleic acids may be present in the submitted sample  additional confirmatory testing may be necessary for epidemiological  and / or clinical management purposes  to differentiate between  SARS-CoV-2 and other Sarbecovirus currently known to infect humans.  If clinically indicated additional testing with an alternate test  methodology [phone]) is advised. The SARS-CoV-2 RNA is generally  detectable in upper and lower respiratory sp ecimens during the acute  phase of infection. The expected result is Negative. Fact Sheet for Patients:  StrictlyIdeas.no Fact Sheet for Healthcare Providers: BankingDealers.co.za This test is not yet approved or cleared by the [place] FDA and has been authorized for detection and/or diagnosis of SARS-CoV-2 by FDA under an Emergency Use Authorization (EUA).  This EUA will remain in effect (meaning this test can be used) for the duration of the COVID-19 declaration under Section 564(b)(1) of the Act, 21 U.S.C. section 360bbb-3(b)(1), unless the authorization is terminated or revoked sooner. Performed at [facility], [place] [address], [place], [place] [address]   Urine Culture     Status: Abnormal   Collection Time: 01/16/19   1:37 AM   Specimen: Urine, Random  Result Value Ref Range Status   Specimen Description URINE, RANDOM  Final   Special Requests NONE  Final   Culture (A)  Final    <10,000 COLONIES/mL INSIGNIFICANT GROWTH Performed at [facility], [place] [address] [place], [place] [address]    Report Status 01/17/2019 FINAL  Final  Culture, blood (Routine X 2) w Reflex to ID Panel     Status: None (Preliminary result)   Collection Time: 01/16/19  3:10 AM   Specimen: BLOOD LEFT ARM  Result Value Ref Range Status   Specimen Description BLOOD LEFT ARM  Final   Special Requests   Final    BOTTLES DRAWN AEROBIC AND ANAEROBIC Blood Culture adequate volume   Culture   Final    NO GROWTH 1 DAY Performed at [facility], [place] [address], [place], [place] [address]    Report Status PENDING  Incomplete  Culture, blood (Routine X 2) w Reflex to ID Panel     Status: None (Preliminary result)   Collection Time: 01/16/19  3:13 AM   Specimen: BLOOD RIGHT FOREARM  Result Value Ref Range Status   Specimen Description BLOOD RIGHT FOREARM  Final   Special Requests   Final    BOTTLES DRAWN AEROBIC AND ANAEROBIC Blood Culture adequate volume   Culture   Final    NO GROWTH 1 DAY Performed at [facility], [place] [address], [place], [place] [address]    Report Status PENDING  Incomplete  MRSA PCR Screening     Status: None   Collection Time: 01/16/19  4:25 AM   Specimen: Nasal Mucosa; Nasopharyngeal  Result Value Ref Range Status   MRSA by PCR NEGATIVE NEGATIVE Final    Comment:        The GeneXpert MRSA Assay (FDA approved for NASAL specimens only), is one component of a comprehensive MRSA colonization surveillance program. It is not intended to diagnose MRSA infection nor to guide or monitor treatment for MRSA infections. Performed at [facility], [place] [address], [place], [place] [address]   Respiratory Panel by PCR     Status: None   Collection Time: 01/16/19  4:38 AM   Specimen:  Nasopharyngeal Swab; Respiratory  Result Value Ref Range Status   Adenovirus NOT DETECTED NOT DETECTED Final   Coronavirus 229E NOT DETECTED NOT DETECTED Final    Comment: (NOTE) The Coronavirus on the Respiratory Panel, DOES NOT test for the novel  Coronavirus (2019 nCoV)    Coronavirus HKU1 NOT DETECTED NOT DETECTED Final   Coronavirus NL63 NOT DETECTED NOT DETECTED Final   Coronavirus OC43 NOT DETECTED NOT DETECTED Final   Metapneumovirus NOT DETECTED NOT DETECTED Final   Rhinovirus / Enterovirus NOT DETECTED NOT DETECTED Final   Influenza A NOT DETECTED NOT DETECTED Final   Influenza B NOT DETECTED NOT DETECTED Final   Parainfluenza Virus 1 NOT DETECTED NOT DETECTED Final   Parainfluenza Virus 2 NOT DETECTED NOT DETECTED Final   Parainfluenza Virus 3 NOT DETECTED NOT DETECTED Final   Parainfluenza Virus 4 NOT DETECTED NOT DETECTED Final   Respiratory Syncytial Virus NOT DETECTED NOT DETECTED Final   Bordetella pertussis NOT DETECTED NOT DETECTED Final   Chlamydophila pneumoniae NOT DETECTED NOT DETECTED Final   Mycoplasma pneumoniae NOT DETECTED NOT DETECTED Final    Comment: Performed at [facility], [place]. [address], [place], [place] [address]     Studies: No results found.  Scheduled Meds: . diphenhydrAMINE  25 mg Oral QHS   And  . acetaminophen  500 mg Oral QHS  . aspirin EC  81 mg Oral Daily  . azithromycin  250 mg Oral Daily  . brimonidine  1 drop Both Eyes Q12H   And  . timolol  1 drop Both Eyes Q12H  . enoxaparin (LOVENOX) injection  40 mg Subcutaneous Q24H  . folic acid  1 mg Oral Daily  . furosemide  20 mg Intravenous BID  . insulin aspart  0-5 Units Subcutaneous QHS  . insulin aspart  0-9 Units Subcutaneous TID WC  . ipratropium-albuterol  3 mL Nebulization TID  . irbesartan  150 mg Oral Daily  . LORazepam  0-4 mg Intravenous Q12H  . methylPREDNISolone (SOLU-MEDROL) injection  60 mg Intravenous Q12H  . multivitamin  1 tablet Oral BID  . multivitamin  with minerals  1 tablet Oral Daily  . pregabalin  75 mg Oral TID  . thiamine  100 mg Oral Daily   Or  . thiamine  100 mg Intravenous Daily  . white petrolatum        Continuous Infusions: . cefTRIAXone (ROCEPHIN)  IV Stopped (01/17/19 2231)     Flora Lipps, MD  [facility] 01/18/2019

## 2019-01-19 LAB — CBC
HCT: 37.9 % — ABNORMAL LOW (ref 39.0–52.0)
Hemoglobin: 12.4 g/dL — ABNORMAL LOW (ref 13.0–17.0)
MCH: 30.5 pg (ref 26.0–34.0)
MCHC: 32.7 g/dL (ref 30.0–36.0)
MCV: 93.1 fL (ref 80.0–100.0)
Platelets: 155 10*3/uL (ref 150–400)
RBC: 4.07 MIL/uL — ABNORMAL LOW (ref 4.22–5.81)
RDW: 14.6 % (ref 11.5–15.5)
WBC: 10.9 10*3/uL — ABNORMAL HIGH (ref 4.0–10.5)
nRBC: 0 % (ref 0.0–0.2)

## 2019-01-19 LAB — BASIC METABOLIC PANEL
Anion gap: 12 (ref 5–15)
BUN: 23 mg/dL (ref 8–23)
CO2: 30 mmol/L (ref 22–32)
Calcium: 8.6 mg/dL — ABNORMAL LOW (ref 8.9–10.3)
Chloride: 90 mmol/L — ABNORMAL LOW (ref 98–111)
Creatinine, Ser: 0.98 mg/dL (ref 0.61–1.24)
GFR calc Af Amer: >60 mL/min (ref >60)
GFR calc non Af Amer: >60 mL/min (ref >60)
Glucose, Bld: 243 mg/dL — ABNORMAL HIGH (ref 70–99)
Potassium: 5.1 mmol/L (ref 3.5–5.1)
Sodium: 132 mmol/L — ABNORMAL LOW (ref 135–145)

## 2019-01-19 LAB — GLUCOSE, CAPILLARY
Glucose-Capillary: 221 mg/dL — ABNORMAL HIGH (ref 70–99)
Glucose-Capillary: 216 mg/dL — ABNORMAL HIGH (ref 70–99)
Glucose-Capillary: 294 mg/dL — ABNORMAL HIGH (ref 70–99)
Glucose-Capillary: 219 mg/dL — ABNORMAL HIGH (ref 70–99)

## 2019-01-19 LAB — BRAIN NATRIURETIC PEPTIDE: B Natriuretic Peptide: 155.6 pg/mL — ABNORMAL HIGH (ref 0.0–100.0)

## 2019-01-19 LAB — MAGNESIUM: Magnesium: 1.9 mg/dL (ref 1.7–2.4)

## 2019-01-19 MED ORDER — METHYLPREDNISOLONE SODIUM SUCC 40 MG IJ SOLR
40.0000 mg | Freq: Two times a day (BID) | INTRAMUSCULAR | Status: DC
  Administered 2019-01-19: 21:00:00 40 mg via INTRAVENOUS
  Filled 2019-01-19: qty 1

## 2019-01-19 NOTE — Progress Notes (Signed)
PROGRESS NOTE  Nathan Cunningham LOV:[id number] DOB: [birth year] DOA: 01/15/2019 PCP: Lucille Passy, MD   LOS: 3 days   Brief narrative: Nathan Penner Willinghamis a 85 y.o.malewith medical history significant ofhypertension, hyperlipidemia, diabetes mellitus, anemia, alcohol abuse, OSA on CPAP, myelomonocytic leukemia, atrial flutter, who presented to the hospital with progressive shortness breath, dry cough, leg edema and dysuria.  Reportedly desaturated to 82% on room air. In ED, hemodynamically stable. Troponin 0.05, BNP 176, negative COVID-19 test, BMP not impressive. Urinalysis concerning for UTI. Chest x-ray showed bilateral mild basilar atelectasis.CTA chest/abdomen/pelvis negative for PE but dilated PA, emphysematous changes, bronchial wall thickening and tree-in-bud opacities suspicious for infectious bronchiolitis, trace bilateral pleural effusion, sigmoid diverticulosis without diverticulitis and symmetric bladder wall thickening. Patient was admitted for possible CAP and UTI.  Subjective: Patient feels okay.  Denies any chest pain, dyspnea but has some shortness of breath.  He has been diuresing.  Complains of cough.  No fever or chills.  Assessment/Plan:  Principal Problem:   Acute respiratory failure with hypoxia (HCC) Active Problems:   Diabetes mellitus with complication (HCC)   HTN (hypertension)   UTI (urinary tract infection)   OSA (obstructive sleep apnea)   Type 2 diabetes mellitus with diabetic neuropathy, unspecified (HCC)   Bronchiolitis   Alcohol abuse   Lower leg edema   Elevated troponin   Acute respiratory failure (HCC)  Acute hypoxemic respiratory failure likely secondary to COPD, bronchiolitis and pulmonary hypertension.  PE excluded by CTA.     Never smoker.  COVID-19, RVP, influenza and blood cultures negative.   Patient has been titrated down on oxygen to 3 L/min and still saturating around 90%.  We will continue to wean oxygen as possible.  PT has  recommended home PT on discharge.  Streptococcal urinary antigen was negative.  No leukocytosis.  Currently on Solu-Medrol 60 mg IV every 12.  Will change to 40mg  every 12H for now.  On Rocephin and Zithromax.  We will continue with that for now.  Patient might need oxygen on discharge if unable to wean.  Patient is not on diuretic at home.  Might need diuretics on discharge.  Elevated troponin: Non-ACS pattern flat troponin elevation likely demand ischemia.  EKG was nonischemic.Marland Kitchen  Echo with EF of 60 to 88%, diastolic dysfunction and trivial pericardial effusion but no other significant structural functional abnormalities.  on  IV Lasix.  6 Pound weight loss documented.  Continue standing weights., intake output and monitor renal function. Monitor electrolytes and replenish aggressively.  UTI: CT abdomen and pelvis showed a symmetric bladder wall thickening which could be related to UTI but cannot exclude neoplasm.  Urine culture with insignificant growth. Continue Rocephin IV to complete the course..  Patient will need outpatient urology referral to exclude malignancy.   NIDDM-2: A1c 7.2% on 10/10/2018.  On Januvia and Kombiglyze at home.  We will continue to hold OHAs.  Continue sliding scale insulin, add lantus and novolog today due to hyperglycemia on steroids. Continue to monitor Accu-Cheks diabetic diet.  A1c on 01/16/2019 was 7.7.  Levemir and mealtime insulin has been added due to patient being on steroids.  Mild hypomagnesemia.  Will replace orally.  Check levels in a.m.  History of alcohol abuse ascites..  No withdrawal symptoms at this time.  He does have a mild ascites and peripheral edema possibly secondary to diastolic heart failure.  T of the abdomen showed normal liver.  Continue with IV diuresis.  Added low-dose spironolactone yesterday.  Will  closely monitor for potassium levels.  VTE Prophylaxis: Lovenox  Code Status: Full code  Family Communication: None  Disposition Plan: Home  with home health likely by tomorrow if the patient remains clinically stable.  Check oxygen requirement prior to discharge.  Will continue to ambulate the patient today.  Patient is currently weaned down to 3 L of oxygen today.   Consultants: None  Procedures:  None  Antibiotics: Anti-infectives (From admission, onward)   Start     Dose/Rate Route Frequency Ordered Stop   01/17/19 1000  azithromycin (ZITHROMAX) tablet 250 mg     250 mg Oral Daily 01/16/19 0137 01/21/19 0959   01/16/19 2200  cefTRIAXone (ROCEPHIN) 1 g in sodium chloride 0.9 % 100 mL IVPB     1 g 200 mL/hr over 30 Minutes Intravenous Every 24 hours 01/16/19 0131     01/16/19 1000  azithromycin (ZITHROMAX) tablet 500 mg     500 mg Oral Daily 01/16/19 0137 01/16/19 0928   01/15/19 2345  cefTRIAXone (ROCEPHIN) 1 g in sodium chloride 0.9 % 100 mL IVPB     1 g 200 mL/hr over 30 Minutes Intravenous  Once 01/15/19 2334 01/16/19 0038     Objective: Vitals:   01/19/19 0800 01/19/19 0841  BP: (!) 120/49   Pulse:    Resp: 14   Temp: 98 F (36.7 C)   SpO2: 92% 92%    Intake/Output Summary (Last 24 hours) at 01/19/2019 1006 Last data filed at 01/19/2019 0000 Gross per 24 hour  Intake 682 ml  Output -  Net 682 ml   Filed Weights   01/17/19 0443 01/18/19 0337 01/19/19 0600  Weight: 85.1 kg 84.4 kg 82.8 kg   Body mass index is 28.59 kg/m.   Physical Exam: GENERAL: Patient is alert awake and oriented. Not in obvious distress.  On nasal cannula oxygen HENT: No scleral pallor or icterus. Pupils equally reactive to light. Oral mucosa is moist NECK: is supple, no palpable thyroid enlargement. CHEST:Diminished breath sounds bilaterally.  No obvious wheezes or crackles noted CVS: S1 and S2 heard, no murmur. Regular rate and rhythm. No pericardial rub. ABDOMEN: Soft, non-tender, bowel sounds are present.  Mildly distended abdomen with ascites. No palpable hepato-splenomegaly. EXTREMITIES: Bilateral lower extremity  pitting edema noted- much improved. CNS: Cranial nerves are intact. No focal motor or sensory deficits. SKIN: warm and dry without rashes.  Data Review: I have personally reviewed the following laboratory data and studies,  CBC: Recent Labs  Lab 01/15/19 1517 01/17/19 0238 01/18/19 0241 01/19/19 0804  WBC 8.3 9.7 6.9 10.9*  HGB 11.7* 11.1* 12.1* 12.4*  HCT 37.3* 35.2* 37.3* 37.9*  MCV 95.9 95.9 93.3 93.1  PLT 157 141* 138* 782   Basic Metabolic Panel: Recent Labs  Lab 01/15/19 1517 01/17/19 0238 01/18/19 0241 01/19/19 0804  NA 138 134* 132* 132*  K 4.7 3.9 4.4 5.1  CL 100 96* 92* 90*  CO2 31 33* 31 30  GLUCOSE 159* 143* 245* 243*  BUN 18 12 16 23   CREATININE 0.94 0.76 0.72 0.98  CALCIUM 8.7* 8.1* 8.6* 8.6*  MG  --  1.6* 2.0 1.9   Liver Function Tests: Recent Labs  Lab 01/15/19 2238  AST 9*  ALT 11  ALKPHOS 54  BILITOT 0.8  PROT 7.4  ALBUMIN 3.2*   No results for input(s): LIPASE, AMYLASE in the last 168 hours. No results for input(s): AMMONIA in the last 168 hours. Cardiac Enzymes: Recent Labs  Lab 01/15/19 2238 01/16/19  5056 01/16/19 0715 01/16/19 1245  TROPONINI 0.05* 0.04* 0.04* 0.04*   BNP (last 3 results) Recent Labs    01/15/19 1517  BNP 176.0*    ProBNP (last 3 results) No results for input(s): PROBNP in the last 8760 hours.  CBG: Recent Labs  Lab 01/18/19 0830 01/18/19 1135 01/18/19 1631 01/18/19 2119 01/19/19 0623  GLUCAP 380* 288* 267* 226* 221*   Recent Results (from the past 240 hour(s))  SARS Coronavirus 2 (CEPHEID- Performed in [facility] lab), Hosp Order     Status: None   Collection Time: 01/15/19 11:07 PM   Specimen: Nasopharyngeal Swab  Result Value Ref Range Status   SARS Coronavirus 2 NEGATIVE NEGATIVE Final    Comment: (NOTE) If result is NEGATIVE SARS-CoV-2 target nucleic acids are NOT DETECTED. The SARS-CoV-2 RNA is generally detectable in upper and lower  respiratory specimens during the acute  phase of infection. The lowest  concentration of SARS-CoV-2 viral copies this assay can detect is 250  copies / mL. A negative result does not preclude SARS-CoV-2 infection  and should not be used as the sole basis for treatment or other  patient management decisions.  A negative result may occur with  improper specimen collection / handling, submission of specimen other  than nasopharyngeal swab, presence of viral mutation(s) within the  areas targeted by this assay, and inadequate number of viral copies  (<250 copies / mL). A negative result must be combined with clinical  observations, patient history, and epidemiological information. If result is POSITIVE SARS-CoV-2 target nucleic acids are DETECTED. The SARS-CoV-2 RNA is generally detectable in upper and lower  respiratory specimens dur ing the acute phase of infection.  Positive  results are indicative of active infection with SARS-CoV-2.  Clinical  correlation with patient history and other diagnostic information is  necessary to determine patient infection status.  Positive results do  not rule out bacterial infection or co-infection with other viruses. If result is PRESUMPTIVE POSTIVE SARS-CoV-2 nucleic acids MAY BE PRESENT.   A presumptive positive result was obtained on the submitted specimen  and confirmed on repeat testing.  While 2019 novel coronavirus  (SARS-CoV-2) nucleic acids may be present in the submitted sample  additional confirmatory testing may be necessary for epidemiological  and / or clinical management purposes  to differentiate between  SARS-CoV-2 and other Sarbecovirus currently known to infect humans.  If clinically indicated additional testing with an alternate test  methodology [phone]) is advised. The SARS-CoV-2 RNA is generally  detectable in upper and lower respiratory sp ecimens during the acute  phase of infection. The expected result is Negative. Fact Sheet for Patients:   StrictlyIdeas.no Fact Sheet for Healthcare Providers: BankingDealers.co.za This test is not yet approved or cleared by the [place] FDA and has been authorized for detection and/or diagnosis of SARS-CoV-2 by FDA under an Emergency Use Authorization (EUA).  This EUA will remain in effect (meaning this test can be used) for the duration of the COVID-19 declaration under Section 564(b)(1) of the Act, 21 U.S.C. section 360bbb-3(b)(1), unless the authorization is terminated or revoked sooner. Performed at [facility], [place] [address], [place], [place] [address]   Urine Culture     Status: Abnormal   Collection Time: 01/16/19  1:37 AM   Specimen: Urine, Random  Result Value Ref Range Status   Specimen Description URINE, RANDOM  Final   Special Requests NONE  Final   Culture (A)  Final    <10,000  COLONIES/mL INSIGNIFICANT GROWTH Performed at [facility] [address] [place], [place] [address]    Report Status 01/17/2019 FINAL  Final  Culture, blood (Routine X 2) w Reflex to ID Panel     Status: None (Preliminary result)   Collection Time: 01/16/19  3:10 AM   Specimen: BLOOD LEFT ARM  Result Value Ref Range Status   Specimen Description BLOOD LEFT ARM  Final   Special Requests   Final    BOTTLES DRAWN AEROBIC AND ANAEROBIC Blood Culture adequate volume   Culture   Final    NO GROWTH 3 DAYS Performed at [facility], [place] [address], [place], [place] [address]    Report Status PENDING  Incomplete  Culture, blood (Routine X 2) w Reflex to ID Panel     Status: None (Preliminary result)   Collection Time: 01/16/19  3:13 AM   Specimen: BLOOD RIGHT FOREARM  Result Value Ref Range Status   Specimen Description BLOOD RIGHT FOREARM  Final   Special Requests   Final    BOTTLES DRAWN AEROBIC AND ANAEROBIC Blood Culture adequate volume   Culture   Final    NO GROWTH 3 DAYS Performed at [facility], [place]  [address] [place], [place] [address]    Report Status PENDING  Incomplete  MRSA PCR Screening     Status: None   Collection Time: 01/16/19  4:25 AM   Specimen: Nasal Mucosa; Nasopharyngeal  Result Value Ref Range Status   MRSA by PCR NEGATIVE NEGATIVE Final    Comment:        The GeneXpert MRSA Assay (FDA approved for NASAL specimens only), is one component of a comprehensive MRSA colonization surveillance program. It is not intended to diagnose MRSA infection nor to guide or monitor treatment for MRSA infections. Performed at [facility], [place] [address], [place], [place] [address]   Respiratory Panel by PCR     Status: None   Collection Time: 01/16/19  4:38 AM   Specimen: Nasopharyngeal Swab; Respiratory  Result Value Ref Range Status   Adenovirus NOT DETECTED NOT DETECTED Final   Coronavirus 229E NOT DETECTED NOT DETECTED Final    Comment: (NOTE) The Coronavirus on the Respiratory Panel, DOES NOT test for the novel  Coronavirus (2019 nCoV)    Coronavirus HKU1 NOT DETECTED NOT DETECTED Final   Coronavirus NL63 NOT DETECTED NOT DETECTED Final   Coronavirus OC43 NOT DETECTED NOT DETECTED Final   Metapneumovirus NOT DETECTED NOT DETECTED Final   Rhinovirus / Enterovirus NOT DETECTED NOT DETECTED Final   Influenza A NOT DETECTED NOT DETECTED Final   Influenza B NOT DETECTED NOT DETECTED Final   Parainfluenza Virus 1 NOT DETECTED NOT DETECTED Final   Parainfluenza Virus 2 NOT DETECTED NOT DETECTED Final   Parainfluenza Virus 3 NOT DETECTED NOT DETECTED Final   Parainfluenza Virus 4 NOT DETECTED NOT DETECTED Final   Respiratory Syncytial Virus NOT DETECTED NOT DETECTED Final   Bordetella pertussis NOT DETECTED NOT DETECTED Final   Chlamydophila pneumoniae NOT DETECTED NOT DETECTED Final   Mycoplasma pneumoniae NOT DETECTED NOT DETECTED Final    Comment: Performed at [facility], [place]. [address] [place], [place] [address]     Studies: No results found.  Scheduled  Meds: . diphenhydrAMINE  25 mg Oral QHS   And  . acetaminophen  500 mg Oral QHS  . aspirin EC  81 mg Oral Daily  . azithromycin  250 mg  Oral Daily  . brimonidine  1 drop Both Eyes Q12H   And  . timolol  1 drop Both Eyes Q12H  . enoxaparin (LOVENOX) injection  40 mg Subcutaneous Q24H  . folic acid  1 mg Oral Daily  . furosemide  40 mg Intravenous BID  . insulin aspart  0-5 Units Subcutaneous QHS  . insulin aspart  0-9 Units Subcutaneous TID WC  . insulin aspart  3 Units Subcutaneous TID WC  . insulin detemir  8 Units Subcutaneous BID  . ipratropium-albuterol  3 mL Nebulization TID  . irbesartan  150 mg Oral Daily  . LORazepam  0-4 mg Intravenous Q12H  . magnesium oxide  400 mg Oral BID  . methylPREDNISolone (SOLU-MEDROL) injection  60 mg Intravenous Q12H  . multivitamin  1 tablet Oral BID  . multivitamin with minerals  1 tablet Oral Daily  . pregabalin  75 mg Oral TID  . spironolactone  12.5 mg Oral Daily  . thiamine  100 mg Oral Daily   Or  . thiamine  100 mg Intravenous Daily    Continuous Infusions: . cefTRIAXone (ROCEPHIN)  IV 1 g (01/18/19 2119)     Flora Lipps, MD  [facility] 01/19/2019

## 2019-01-19 NOTE — Care Management Important Message (Signed)
Important Message  Patient Details  Name: Nathan Cunningham MRN: [id number] Date of Birth: 1925/06/05   Medicare Important Message Given:  Yes    Memory Argue 01/19/2019, 1:15 PM

## 2019-01-19 NOTE — Progress Notes (Signed)
Physical Therapy Treatment Patient Details Name: Nathan Cunningham MRN: [id number] DOB: 1925/04/13 Today's Date: 01/19/2019    History of Present Illness This 85 y.o. male admitted with SOB, dry cough, LE edema and dysuria.  02 sats 82% on RA.   Dx:  Acute respiratory failure wtih hypoxemia, possible pulmonary hypertension, UTI.  PMH includes:  OSA, HTN, NIDDM, alcohol use disorder     PT Comments    Pt agreeable to progressing gait.  Emphasis on safety with rollator, safe posture with RW, progressing stability and stamina.   Follow Up Recommendations  Home health PT;Supervision/Assistance - 24 hour     Equipment Recommendations  None recommended by PT    Recommendations for Other Services       Precautions / Restrictions Precautions Precautions: Fall    Mobility  Bed Mobility Overal bed mobility: Needs Assistance             General bed mobility comments: OOB to chair on arrival  Transfers Overall transfer level: Needs assistance Equipment used: Rolling walker (2 wheeled) Transfers: Sit to/from Stand Sit to Stand: Min guard         General transfer comment: cues for hand placement, cues for applying brakes before sitting.  Ambulation/Gait Ambulation/Gait assistance: Min guard Gait Distance (Feet): 140 Feet Assistive device: 4-wheeled walker Gait Pattern/deviations: Step-through pattern Gait velocity: decreased   General Gait Details: trunk significantly flexed, but pt was generally steady overall.  No overt dyspnea.  Pt's sats on 3L was at 94% and also in the  80's..  Pt able to back up into the bathroom without overt deviation, but was more guarded backing up into the bathroom to sit on the toilet.   Stairs             Wheelchair Mobility    Modified Rankin (Stroke Patients Only)       Balance Overall balance assessment: Needs assistance   Sitting balance-Leahy Scale: Fair       Standing balance-Leahy Scale: Fair Standing balance  comment: statically fair, but needs AD for dynamic mobility.                            Cognition Arousal/Alertness: Awake/alert Behavior During Therapy: WFL for tasks assessed/performed Overall Cognitive Status: Within Functional Limits for tasks assessed                                        Exercises      General Comments        Pertinent Vitals/Pain Pain Assessment: No/denies pain    Home Living                      Prior Function            PT Goals (current goals can now be found in the care plan section) Acute Rehab PT Goals Patient Stated Goal: return home PT Goal Formulation: With patient Time For Goal Achievement: 01/31/19 Potential to Achieve Goals: Good Progress towards PT goals: Progressing toward goals    Frequency    Min 3X/week      PT Plan Current plan remains appropriate    Co-evaluation              AM-PAC PT "6 Clicks" Mobility   Outcome Measure  Help needed turning from your back to your  side while in a flat bed without using bedrails?: A Little Help needed moving from lying on your back to sitting on the side of a flat bed without using bedrails?: A Little Help needed moving to and from a bed to a chair (including a wheelchair)?: A Little Help needed standing up from a chair using your arms (e.g., wheelchair or bedside chair)?: A Little Help needed to walk in hospital room?: A Little Help needed climbing 3-5 steps with a railing? : A Lot 6 Click Score: 17    End of Session Equipment Utilized During Treatment: Oxygen Activity Tolerance: Patient tolerated treatment well Patient left: in chair;with call bell/phone within reach;with chair alarm set Nurse Communication: Mobility status PT Visit Diagnosis: Unsteadiness on feet (R26.81);Muscle weakness (generalized) (M62.81);Other abnormalities of gait and mobility (R26.89)     Time: 5809-9833 PT Time Calculation (min) (ACUTE ONLY): 20  min  Charges:  $Gait Training: 8-22 mins                     01/19/2019  Donnella Sham, PT Acute Rehabilitation Services [phone]  (pager) [phone]  (office)   Tessie Fass Armina Galloway 01/19/2019, 3:34 PM

## 2019-01-19 NOTE — Progress Notes (Signed)
Occupational Therapy Treatment Patient Details Name: Nathan Cunningham MRN: [id number] DOB: [birth year] Today's Date: 01/19/2019    History of present illness This 85 y.o. male admitted with SOB, dry cough, LE edema and dysuria.  02 sats 82% on RA.   Dx:  Acute respiratory failure wtih hypoxemia, possible pulmonary hypertension, UTI.  PMH includes:  OSA, HTN, NIDDM, alcohol use disorder    OT comments  Pt fatigued having just returned from bathroom with nursing staff. Began educating pt in energy conservation strategies. Pt reports sitting to shower at home and uses a bath brush to reach his feet as well as a sock aid. Pt requiring min guard to min assist for mobility with rollator. Fair static standing. Offered to walk with pt in hall for endurance, but pt declined.   Follow Up Recommendations  Home health OT;Supervision/Assistance - 24 hour    Equipment Recommendations  None recommended by OT    Recommendations for Other Services      Precautions / Restrictions Precautions Precautions: Fall       Mobility Bed Mobility               General bed mobility comments: pt received and returned to chair  Transfers Overall transfer level: Needs assistance Equipment used: Rolling walker (2 wheeled) Transfers: Sit to/from Stand Sit to Stand: Min guard         General transfer comment: cues for hand placement, min guard for safety    Balance Overall balance assessment: Needs assistance   Sitting balance-Leahy Scale: Fair       Standing balance-Leahy Scale: Fair Standing balance comment: statically at sink                           ADL either performed or assessed with clinical judgement   ADL Overall ADL's : Needs assistance/impaired     Grooming: Oral care;Standing;Supervision/safety           Upper Body Dressing : Set up;Sitting Upper Body Dressing Details (indicate cue type and reason): front opening gown   Lower Body Dressing Details  (indicate cue type and reason): pt reports he typically uses a sock aid to don socks, rubs feet on floor to doff, can don pants without AE             Functional mobility during ADLs: Minimal assistance;Rolling walker General ADL Comments: pt uses AE at home     Vision       Perception     Praxis      Cognition Arousal/Alertness: Awake/alert Behavior During Therapy: WFL for tasks assessed/performed Overall Cognitive Status: Within Functional Limits for tasks assessed                                          Exercises     Shoulder Instructions       General Comments      Pertinent Vitals/ Pain       Pain Assessment: No/denies pain  Home Living                                          Prior Functioning/Environment              Frequency  Min 2X/week  Progress Toward Goals  OT Goals(current goals can now be found in the care plan section)  Progress towards OT goals: Progressing toward goals  Acute Rehab OT Goals Patient Stated Goal: return home OT Goal Formulation: With patient Time For Goal Achievement: 01/30/19 Potential to Achieve Goals: Good  Plan Discharge plan remains appropriate    Co-evaluation                 AM-PAC OT "6 Clicks" Daily Activity     Outcome Measure   Help from another person eating meals?: None Help from another person taking care of personal grooming?: A Little Help from another person toileting, which includes using toliet, bedpan, or urinal?: A Little Help from another person bathing (including washing, rinsing, drying)?: A Little Help from another person to put on and taking off regular upper body clothing?: None Help from another person to put on and taking off regular lower body clothing?: A Little 6 Click Score: 20    End of Session Equipment Utilized During Treatment: Rolling walker;Oxygen;Gait belt  OT Visit Diagnosis: Unsteadiness on feet (R26.81)    Activity Tolerance Patient tolerated treatment well   Patient Left in chair;with call bell/phone within reach   Nurse Communication          Time: 8127-5170 OT Time Calculation (min): 13 min  Charges: OT General Charges $OT Visit: 1 Visit OT Treatments $Self Care/Home Management : 8-22 mins  Nestor Lewandowsky, OTR/L Acute Rehabilitation Services Pager: [phone] Office: [phone]   Malka So 01/19/2019, 1:56 PM

## 2019-01-19 NOTE — TOC Initial Note (Addendum)
Transition of Care [facility]) - Initial/Assessment Note    Patient Details  Name: Nathan Cunningham MRN: [id number] Date of Birth: 06-25-1925  Transition of Care [facility]) CM/SW Contact:    Maryclare Labrador, RN Phone Number: 01/19/2019, 1:50 PM  Clinical Narrative:               Pt is from home with partner- partner will provide recommended supervision at discharge.  Pt informed CM that he has a PCP and denied barriers with paying for meds.  Pt is interested in [facility] as recommended.  CM provided medicare.gov HH list via phone - pt chose [facility] -agency contacted and pending referral accepted awaiting orders.  CM requested orders    Expected Discharge Plan: [place]     Patient Goals and CMS Choice Patient states their goals for this hospitalization and ongoing recovery are:: Pt is eager to get back home and resume life with his partner CMS Medicare.gov Compare Post Acute Care list provided to:: Patient Choice offered to / list presented to : Patient  Expected Discharge Plan and Services Expected Discharge Plan: [place]       Living arrangements for the past 2 months: Single Family Home Expected Discharge Date: 01/18/19                         HH Arranged: PT HH Agency: [place] Date [place]: 01/19/19 Time [place]: [phone] Representative spoke with at [place]: Tommi Rumps  Prior Living Arrangements/Services Living arrangements for the past 2 months: [place] Lives with:: Significant Other Patient language and need for interpreter reviewed:: Yes        Need for Family Participation in Patient Care: No (Comment) Care giver support system in place?: Yes (comment)   Criminal Activity/Legal Involvement Pertinent to Current Situation/Hospitalization: No - Comment as needed  Activities of Daily Living Home Assistive Devices/Equipment: CBG Meter, Walker (specify type) ADL Screening (condition at time of  admission) Patient's cognitive ability adequate to safely complete daily activities?: Yes Is the patient deaf or have difficulty hearing?: No Does the patient have difficulty seeing, even when wearing glasses/contacts?: No Does the patient have difficulty concentrating, remembering, or making decisions?: No Patient able to express need for assistance with ADLs?: Yes Does the patient have difficulty dressing or bathing?: No Independently performs ADLs?: Yes (appropriate for developmental age) Does the patient have difficulty walking or climbing stairs?: Yes Weakness of Legs: Both Weakness of Arms/Hands: None  Permission Sought/Granted         Permission granted to share info w AGENCY: Alvis Lemmings        Emotional Assessment   Attitude/Demeanor/Rapport: Self-Confident, Engaged Affect (typically observed): Accepting, Adaptable Orientation: : Oriented to Self, Oriented to Place, Oriented to  Time, Oriented to Situation   Psych Involvement: No (comment)  Admission diagnosis:  Hypoxia [R09.02] Acute cystitis without hematuria [N30.00] Patient Active Problem List   Diagnosis Date Noted  . Acute respiratory failure with hypoxia ([place]) 01/16/2019  . Bronchiolitis 01/16/2019  . Alcohol abuse 01/16/2019  . Lower leg edema 01/16/2019  . Elevated troponin 01/16/2019  . Acute respiratory failure ([place]) 01/16/2019  . Hypoxia 01/15/2019  . Arthritis 01/14/2019  . Insomnia 07/11/2018  . Type 2 diabetes mellitus with diabetic neuropathy, unspecified ([place]) 04/04/2018  . OSA (obstructive sleep apnea) 07/06/2016  . Vitamin B12 deficiency 06/07/2016  . Hemorrhoids 03/05/2016  . Thrombocytopenia ([place]) 03/03/2016  .  UTI (urinary tract infection) 03/03/2016  . Myelomonocytic leukemia ([place]) 02/27/2015  . Chronic fatigue and malaise 07/22/2014  . Nocturia 03/19/2014  . 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 Thoracic degenerative disc disease 09/28/2010  . ATRIAL FLUTTER 02/27/2010  . RISK OF  FALLING 10/21/2009  . 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 URINARY FREQUENCY, CHRONIC 10/02/2007  . BPH (benign prostatic hyperplasia) 08/31/2007  . HTN (hypertension) 04/24/2007  . Diabetes mellitus with complication ([place]) 08/65/7846  . B12 deficiency 03/23/2007  . DISORDER, CERVICAL Luke W/MYELOPATHY 03/23/2007  . STENOSIS, LUMBAR SPINE 03/23/2007   PCP:  Lucille Passy, MD Pharmacy:   [facility], [place] [address] Phone: [phone] Fax: [id number] [place], [place] [address] [place] [address] Phone: [phone] Fax: [phone]  [facility] [place], [place] [address] Phone: [phone] Fax: [phone]     Social Determinants of Health (SDOH) Interventions    Readmission Risk Interventions No flowsheet data found.

## 2019-01-19 NOTE — Progress Notes (Signed)
RT Note:  Patient refuses CPAP.

## 2019-01-20 DIAGNOSIS — J441 Chronic obstructive pulmonary disease with (acute) exacerbation: Secondary | ICD-10-CM

## 2019-01-20 DIAGNOSIS — E1165 Type 2 diabetes mellitus with hyperglycemia: Secondary | ICD-10-CM

## 2019-01-20 DIAGNOSIS — I248 Other forms of acute ischemic heart disease: Secondary | ICD-10-CM

## 2019-01-20 DIAGNOSIS — I5033 Acute on chronic diastolic (congestive) heart failure: Secondary | ICD-10-CM

## 2019-01-20 LAB — COMPREHENSIVE METABOLIC PANEL
ALT: 19 U/L (ref 0–44)
AST: 15 U/L (ref 15–41)
Albumin: 2.9 g/dL — ABNORMAL LOW (ref 3.5–5.0)
Alkaline Phosphatase: 45 U/L (ref 38–126)
Anion gap: 9 (ref 5–15)
BUN: 33 mg/dL — ABNORMAL HIGH (ref 8–23)
CO2: 34 mmol/L — ABNORMAL HIGH (ref 22–32)
Calcium: 8.6 mg/dL — ABNORMAL LOW (ref 8.9–10.3)
Chloride: 87 mmol/L — ABNORMAL LOW (ref 98–111)
Creatinine, Ser: 0.98 mg/dL (ref 0.61–1.24)
GFR calc Af Amer: >60 mL/min (ref >60)
GFR calc non Af Amer: >60 mL/min (ref >60)
Glucose, Bld: 196 mg/dL — ABNORMAL HIGH (ref 70–99)
Potassium: 5.4 mmol/L — ABNORMAL HIGH (ref 3.5–5.1)
Sodium: 130 mmol/L — ABNORMAL LOW (ref 135–145)
Total Bilirubin: 0.7 mg/dL (ref 0.3–1.2)
Total Protein: 6.9 g/dL (ref 6.5–8.1)

## 2019-01-20 LAB — CBC
HCT: 37.3 % — ABNORMAL LOW (ref 39.0–52.0)
Hemoglobin: 12.2 g/dL — ABNORMAL LOW (ref 13.0–17.0)
MCH: 30.1 pg (ref 26.0–34.0)
MCHC: 32.7 g/dL (ref 30.0–36.0)
MCV: 92.1 fL (ref 80.0–100.0)
Platelets: 160 10*3/uL (ref 150–400)
RBC: 4.05 MIL/uL — ABNORMAL LOW (ref 4.22–5.81)
RDW: 14.6 % (ref 11.5–15.5)
WBC: 11 10*3/uL — ABNORMAL HIGH (ref 4.0–10.5)
nRBC: 0 % (ref 0.0–0.2)

## 2019-01-20 LAB — GLUCOSE, CAPILLARY
Glucose-Capillary: 211 mg/dL — ABNORMAL HIGH (ref 70–99)
Glucose-Capillary: 265 mg/dL — ABNORMAL HIGH (ref 70–99)
Glucose-Capillary: 99 mg/dL (ref 70–99)
Glucose-Capillary: 69 mg/dL — ABNORMAL LOW (ref 70–99)
Glucose-Capillary: 102 mg/dL — ABNORMAL HIGH (ref 70–99)
Glucose-Capillary: 86 mg/dL (ref 70–99)

## 2019-01-20 LAB — MAGNESIUM: Magnesium: 2 mg/dL (ref 1.7–2.4)

## 2019-01-20 MED ORDER — INSULIN ASPART 100 UNIT/ML ~~LOC~~ SOLN
0.0000 [IU] | SUBCUTANEOUS | Status: DC
  Administered 2019-01-20: 12:00:00 8 [IU] via SUBCUTANEOUS
  Administered 2019-01-20: 5 [IU] via SUBCUTANEOUS
  Administered 2019-01-21: 12:00:00 3 [IU] via SUBCUTANEOUS
  Administered 2019-01-21: 06:00:00 2 [IU] via SUBCUTANEOUS
  Administered 2019-01-21: 11 [IU] via SUBCUTANEOUS
  Administered 2019-01-21 – 2019-01-22 (×2): 5 [IU] via SUBCUTANEOUS
  Administered 2019-01-22 (×3): 3 [IU] via SUBCUTANEOUS

## 2019-01-20 MED ORDER — INSULIN ASPART 100 UNIT/ML ~~LOC~~ SOLN
8.0000 [IU] | Freq: Three times a day (TID) | SUBCUTANEOUS | Status: DC
  Administered 2019-01-20 – 2019-01-22 (×7): 8 [IU] via SUBCUTANEOUS

## 2019-01-20 MED ORDER — FOLIC ACID 1 MG PO TABS
1.0000 mg | ORAL_TABLET | Freq: Every day | ORAL | Status: DC
  Administered 2019-01-21 – 2019-01-22 (×2): 1 mg via ORAL
  Filled 2019-01-20 (×2): qty 1

## 2019-01-20 MED ORDER — ALUM & MAG HYDROXIDE-SIMETH 200-200-20 MG/5ML PO SUSP
30.0000 mL | Freq: Four times a day (QID) | ORAL | Status: DC | PRN
  Administered 2019-01-20: 30 mL via ORAL
  Filled 2019-01-20: qty 30

## 2019-01-20 MED ORDER — METHYLPREDNISOLONE SODIUM SUCC 40 MG IJ SOLR
40.0000 mg | Freq: Every day | INTRAMUSCULAR | Status: DC
  Administered 2019-01-21 – 2019-01-22 (×2): 40 mg via INTRAVENOUS
  Filled 2019-01-20 (×2): qty 1

## 2019-01-20 MED ORDER — SODIUM CHLORIDE 0.9 % IV BOLUS
250.0000 mL | Freq: Once | INTRAVENOUS | Status: AC | PRN
  Administered 2019-01-20: 250 mL via INTRAVENOUS

## 2019-01-20 MED ORDER — VITAMIN B-1 100 MG PO TABS
100.0000 mg | ORAL_TABLET | Freq: Every day | ORAL | Status: DC
  Administered 2019-01-21 – 2019-01-22 (×2): 100 mg via ORAL
  Filled 2019-01-20 (×2): qty 1

## 2019-01-20 MED ORDER — IPRATROPIUM-ALBUTEROL 0.5-2.5 (3) MG/3ML IN SOLN
3.0000 mL | Freq: Four times a day (QID) | RESPIRATORY_TRACT | Status: DC
  Administered 2019-01-20 – 2019-01-21 (×3): 3 mL via RESPIRATORY_TRACT
  Filled 2019-01-20 (×4): qty 3

## 2019-01-20 MED ORDER — LORAZEPAM 2 MG/ML IJ SOLN: 2.0000 mg | INTRAMUSCULAR | Status: DC | PRN

## 2019-01-20 MED ORDER — AMLODIPINE BESYLATE 5 MG PO TABS
5.0000 mg | ORAL_TABLET | Freq: Every day | ORAL | Status: DC
  Administered 2019-01-20: 5 mg via ORAL
  Filled 2019-01-20: qty 1

## 2019-01-20 NOTE — Plan of Care (Signed)

## 2019-01-20 NOTE — Progress Notes (Signed)
PROGRESS NOTE    Nathan Cunningham  WUJ:[id number] DOB: 03-22-1925 DOA: 01/15/2019 PCP: Lucille Passy, MD   Brief Narrative:  85 y.o. WM PMHx HTN, Atrial flutter, HLD, Diabetes type 2 uncontrolled, anemia, EtOH abuse, OSA on CPAP, Myelomonocytic leukemia,   Presented to the hospital with progressive shortness breath, dry cough, leg edema and dysuria.  Reportedly desaturated to 82% on room air. In ED, hemodynamically stable. Troponin 0.05, BNP 176, negative COVID-19 test, BMP not impressive. Urinalysis concerning for UTI. Chest x-ray showed bilateral mild basilar atelectasis. CTA chest/abdomen/pelvis negative for PE but dilated PA, emphysematous changes, bronchial wall thickening and tree-in-bud opacities suspicious for infectious bronchiolitis, trace bilateral pleural effusion, sigmoid diverticulosis without diverticulitis and symmetric bladder wall thickening. Patient was admitted for possible CAP and UTI.     Subjective: A/O x4, positive acute respiratory failure with hypoxia.  States not on home O2.  States uses CPAP at home.  Believes his base weight is 180 pounds( 81.6 kg) but does not weigh himself daily secondary to feeling unsteady on his feet.   Assessment & Plan:   Principal Problem:   Acute respiratory failure with hypoxia (HCC) Active Problems:   Diabetes mellitus with complication (HCC)   HTN (hypertension)   UTI (urinary tract infection)   OSA (obstructive sleep apnea)   Type 2 diabetes mellitus with diabetic neuropathy, unspecified (HCC)   Bronchiolitis   Alcohol abuse   Lower leg edema   Elevated troponin   Acute respiratory failure (HCC)   Acute hypoxemic respiratory failure/COPD exacerbation -Complete 7-day course antibiotics - Solu-Medrol 40 mg daily - DuoNeb every 6 hours -Mucinex DM twice daily -Flutter valve -Titrate O2 to maintain SPO2 89 to 93%  Acute on Chronic diastolic CHF -Admission weight 81.6 kg -Strict in and out +234ml -Daily weightt    Filed Weights   01/18/19 0337 01/19/19 0600 01/20/19 0510  Weight: 84.4 kg 82.8 kg 82.2 kg  - Continue Lasix IV 40 mg twice daily, for maximum diuresis -Hydralazine PRN - Irbesartan 150 mg daily - 6/13 start metoprolol 12.5 mg twice daily - DC Spironolactone patient's heart function, does not indicate need for spironolactone has retained EF. - Would like to start beta-blocker however borderline bradycardia. - Norvasc 5 mg daily.   Elevated troponin/demand ischemia Recent Labs  Lab 01/15/19 2238 01/16/19 0313 01/16/19 0715 01/16/19 1245  TROPONINI 0.05* 0.04* 0.04* 0.04*  -Slightly elevated but flat, more consistent with demand ischemia     UTI: CT abdomen and pelvis showed a symmetric bladder wall thickening which could be related to UTI but cannot exclude neoplasm.  Urine culture with insignificant growth. Continue Rocephin IV to complete the course..  Patient will need outpatient urology referral to exclude malignancy.   Diabetes type 2 uncontrolled with complication -6/9 hemoglobin A1c= 7.7 -Levemir 8 units twice daily -6/13 increase moderate SSI - 6/13 increase NovoLog 8 units   Hyperkalemia -Most likely secondary to Spironolactone.  Spironolactone has been discontinued, should correct with diuresis continue to monitor. - Potassium goal> 4   Hypomagnesmia - Magnesium goal> 2     EtOH abuse -Patient states has significantly decreased EtOH intake now only drinks wine and occasional beer. -CIWA protocol    DVT prophylaxis: Lovenox Code Status: Full Family Communication: None Disposition Plan: Discharge in next 24 to 48 hours   Consultants:  None    Procedures/Significant Events:  6/9 echocardiogram:Left Ventricle: LVEF 60-65%. - Left ventricular diastolic Doppler parameters are consistent with impaired relaxation. Elevated left ventricular end-diastolic pressure  I have personally reviewed and interpreted all radiology studies and my findings are as  above.  VENTILATOR SETTINGS: None   Cultures 6/8 SARS coronavirus negative 6/9 urine insignificant growth 6/9 blood LEFT arm NGTD 6/9 blood RIGHT forearm NGTD 6/9 MRSA by PCR negative 6/9 respiratory virus panel negative    Antimicrobials: Anti-infectives (From admission, onward)   Start     Stop   01/17/19 1000  azithromycin (ZITHROMAX) tablet 250 mg     01/20/19 0958   01/16/19 2200  cefTRIAXone (ROCEPHIN) 1 g in sodium chloride 0.9 % 100 mL IVPB         01/16/19 1000  azithromycin (ZITHROMAX) tablet 500 mg     01/16/19 0928   01/15/19 2345  cefTRIAXone (ROCEPHIN) 1 g in sodium chloride 0.9 % 100 mL IVPB     01/16/19 0038       Devices    LINES / TUBES:      Continuous Infusions: . cefTRIAXone (ROCEPHIN)  IV Stopped (01/19/19 2221)     Objective: Vitals:   01/19/19 1903 01/19/19 2340 01/20/19 0510 01/20/19 0718  BP: (!) 141/82 128/65    Pulse: 75 64    Resp: 12 15    Temp: 97.8 F (36.6 C) (!) 97.5 F (36.4 C)  (!) 97.5 F (36.4 C)  TempSrc: Oral Oral  Oral  SpO2: 93% 96%    Weight:   82.2 kg   Height:        Intake/Output Summary (Last 24 hours) at 01/20/2019 0730 Last data filed at 01/19/2019 2221 Gross per 24 hour  Intake 820 ml  Output 200 ml  Net 620 ml   Filed Weights   01/18/19 0337 01/19/19 0600 01/20/19 0510  Weight: 84.4 kg 82.8 kg 82.2 kg    Examination:  General: A/O x4, positive  acute respiratory distress Eyes: negative scleral hemorrhage, negative anisocoria, negative icterus ENT: Negative Runny nose, negative gingival bleeding, Neck:  Negative scars, masses, torticollis, lymphadenopathy, JVD Lungs: Clear to auscultation bilaterally without wheezes or crackles Cardiovascular: Regular rate and rhythm without murmur gallop or rub normal S1 and S2 Abdomen: negative abdominal pain, positive distention, positive soft, bowel sounds, no rebound, positive ascites, no appreciable mass Extremities: No significant cyanosis,  clubbing, or edema bilateral lower extremities Skin: Negative rashes, lesions, ulcers Psychiatric:  Negative depression, negative anxiety, negative fatigue, negative mania  Central nervous system:  Cranial nerves II through XII intact, tongue/uvula midline, all extremities muscle strength 5/5, sensation intact throughout, negative dysarthria, negative expressive aphasia, negative receptive aphasia.  .     Data Reviewed: Care during the described time interval was provided by me .  I have reviewed this patient's available data, including medical history, events of note, physical examination, and all test results as part of my evaluation.   CBC: Recent Labs  Lab 01/15/19 1517 01/17/19 0238 01/18/19 0241 01/19/19 0804 01/20/19 0245  WBC 8.3 9.7 6.9 10.9* 11.0*  HGB 11.7* 11.1* 12.1* 12.4* 12.2*  HCT 37.3* 35.2* 37.3* 37.9* 37.3*  MCV 95.9 95.9 93.3 93.1 92.1  PLT 157 141* 138* 155 824   Basic Metabolic Panel: Recent Labs  Lab 01/15/19 1517 01/17/19 0238 01/18/19 0241 01/19/19 0804 01/20/19 0245  NA 138 134* 132* 132* 130*  K 4.7 3.9 4.4 5.1 5.4*  CL 100 96* 92* 90* 87*  CO2 31 33* 31 30 34*  GLUCOSE 159* 143* 245* 243* 196*  BUN 18 12 16 23  33*  CREATININE 0.94 0.76 0.72 0.98 0.98  CALCIUM  8.7* 8.1* 8.6* 8.6* 8.6*  MG  --  1.6* 2.0 1.9 2.0   GFR: Estimated Creatinine Clearance: 50.3 mL/min (by C-G formula based on SCr of 0.98 mg/dL). Liver Function Tests: Recent Labs  Lab 01/15/19 2238 01/20/19 0245  AST 9* 15  ALT 11 19  ALKPHOS 54 45  BILITOT 0.8 0.7  PROT 7.4 6.9  ALBUMIN 3.2* 2.9*   No results for input(s): LIPASE, AMYLASE in the last 168 hours. No results for input(s): AMMONIA in the last 168 hours. Coagulation Profile: Recent Labs  Lab 01/16/19 0313  INR 1.3*   Cardiac Enzymes: Recent Labs  Lab 01/15/19 2238 01/16/19 0313 01/16/19 0715 01/16/19 1245  TROPONINI 0.05* 0.04* 0.04* 0.04*   BNP (last 3 results) No results for input(s): PROBNP  in the last 8760 hours. HbA1C: No results for input(s): HGBA1C in the last 72 hours. CBG: Recent Labs  Lab 01/19/19 0623 01/19/19 1138 01/19/19 1536 01/19/19 2108 01/20/19 0636  GLUCAP 221* 216* 294* 219* 211*   Lipid Profile: No results for input(s): CHOL, HDL, LDLCALC, TRIG, CHOLHDL, LDLDIRECT in the last 72 hours. Thyroid Function Tests: No results for input(s): TSH, T4TOTAL, FREET4, T3FREE, THYROIDAB in the last 72 hours. Anemia Panel: No results for input(s): VITAMINB12, FOLATE, FERRITIN, TIBC, IRON, RETICCTPCT in the last 72 hours. Urine analysis:    Component Value Date/Time   COLORURINE YELLOW 01/15/2019 2240   APPEARANCEUR HAZY (A) 01/15/2019 2240   LABSPEC 1.016 01/15/2019 2240   PHURINE 5.0 01/15/2019 2240   GLUCOSEU NEGATIVE 01/15/2019 2240   HGBUR NEGATIVE 01/15/2019 2240   BILIRUBINUR NEGATIVE 01/15/2019 2240   BILIRUBINUR 1 12/26/2017 1124   KETONESUR 5 (A) 01/15/2019 2240   PROTEINUR 100 (A) 01/15/2019 2240   UROBILINOGEN 0.2 12/26/2017 1124   UROBILINOGEN 0.2 03/18/2008 0800   NITRITE POSITIVE (A) 01/15/2019 2240   LEUKOCYTESUR MODERATE (A) 01/15/2019 2240   Sepsis Labs: @LABRCNTIP (procalcitonin:4,lacticidven:4)  ) Recent Results (from the past 240 hour(s))  SARS Coronavirus 2 (CEPHEID- Performed in [place] hospital lab), Hosp Order     Status: None   Collection Time: 01/15/19 11:07 PM   Specimen: Nasopharyngeal Swab  Result Value Ref Range Status   SARS Coronavirus 2 NEGATIVE NEGATIVE Final    Comment: (NOTE) If result is NEGATIVE SARS-CoV-2 target nucleic acids are NOT DETECTED. The SARS-CoV-2 RNA is generally detectable in upper and lower  respiratory specimens during the acute phase of infection. The lowest  concentration of SARS-CoV-2 viral copies this assay can detect is 250  copies / mL. A negative result does not preclude SARS-CoV-2 infection  and should not be used as the sole basis for treatment or other  patient management  decisions.  A negative result may occur with  improper specimen collection / handling, submission of specimen other  than nasopharyngeal swab, presence of viral mutation(s) within the  areas targeted by this assay, and inadequate number of viral copies  (<250 copies / mL). A negative result must be combined with clinical  observations, patient history, and epidemiological information. If result is POSITIVE SARS-CoV-2 target nucleic acids are DETECTED. The SARS-CoV-2 RNA is generally detectable in upper and lower  respiratory specimens dur ing the acute phase of infection.  Positive  results are indicative of active infection with SARS-CoV-2.  Clinical  correlation with patient history and other diagnostic information is  necessary to determine patient infection status.  Positive results do  not rule out bacterial infection or co-infection with other viruses. If result is PRESUMPTIVE POSTIVE  SARS-CoV-2 nucleic acids MAY BE PRESENT.   A presumptive positive result was obtained on the submitted specimen  and confirmed on repeat testing.  While 2019 novel coronavirus  (SARS-CoV-2) nucleic acids may be present in the submitted sample  additional confirmatory testing may be necessary for epidemiological  and / or clinical management purposes  to differentiate between  SARS-CoV-2 and other Sarbecovirus currently known to infect humans.  If clinically indicated additional testing with an alternate test  methodology [phone]) is advised. The SARS-CoV-2 RNA is generally  detectable in upper and lower respiratory sp ecimens during the acute  phase of infection. The expected result is Negative. Fact Sheet for Patients:  StrictlyIdeas.no Fact Sheet for Healthcare Providers: BankingDealers.co.za This test is not yet approved or cleared by the [place] FDA and has been authorized for detection and/or diagnosis of SARS-CoV-2 by FDA under an  Emergency Use Authorization (EUA).  This EUA will remain in effect (meaning this test can be used) for the duration of the COVID-19 declaration under Section 564(b)(1) of the Act, 21 U.S.C. section 360bbb-3(b)(1), unless the authorization is terminated or revoked sooner. Performed at [facility], [place] [address], [place], [place] [address]   Urine Culture     Status: Abnormal   Collection Time: 01/16/19  1:37 AM   Specimen: Urine, Random  Result Value Ref Range Status   Specimen Description URINE, RANDOM  Final   Special Requests NONE  Final   Culture (A)  Final    <10,000 COLONIES/mL INSIGNIFICANT GROWTH Performed at [facility], [place] [address] [place], [place] [address]    Report Status 01/17/2019 FINAL  Final  Culture, blood (Routine X 2) w Reflex to ID Panel     Status: None (Preliminary result)   Collection Time: 01/16/19  3:10 AM   Specimen: BLOOD LEFT ARM  Result Value Ref Range Status   Specimen Description BLOOD LEFT ARM  Final   Special Requests   Final    BOTTLES DRAWN AEROBIC AND ANAEROBIC Blood Culture adequate volume   Culture   Final    NO GROWTH 3 DAYS Performed at [facility], [place] [address], [place], [place] [address]    Report Status PENDING  Incomplete  Culture, blood (Routine X 2) w Reflex to ID Panel     Status: None (Preliminary result)   Collection Time: 01/16/19  3:13 AM   Specimen: BLOOD RIGHT FOREARM  Result Value Ref Range Status   Specimen Description BLOOD RIGHT FOREARM  Final   Special Requests   Final    BOTTLES DRAWN AEROBIC AND ANAEROBIC Blood Culture adequate volume   Culture   Final    NO GROWTH 3 DAYS Performed at [facility], [place] [address], [place], [place] [address]    Report Status PENDING  Incomplete  MRSA PCR Screening     Status: None   Collection Time: 01/16/19  4:25 AM   Specimen: Nasal Mucosa; Nasopharyngeal  Result Value Ref Range Status   MRSA by PCR NEGATIVE NEGATIVE Final    Comment:         The GeneXpert MRSA Assay (FDA approved for NASAL specimens only), is one component of a comprehensive MRSA colonization surveillance program. It is not intended to diagnose MRSA infection nor to guide or monitor treatment for MRSA infections. Performed at [facility], [place] [address], [place], [place] [address]   Respiratory Panel by PCR     Status: None  Collection Time: 01/16/19  4:38 AM   Specimen: Nasopharyngeal Swab; Respiratory  Result Value Ref Range Status   Adenovirus NOT DETECTED NOT DETECTED Final   Coronavirus 229E NOT DETECTED NOT DETECTED Final    Comment: (NOTE) The Coronavirus on the Respiratory Panel, DOES NOT test for the novel  Coronavirus (2019 nCoV)    Coronavirus HKU1 NOT DETECTED NOT DETECTED Final   Coronavirus NL63 NOT DETECTED NOT DETECTED Final   Coronavirus OC43 NOT DETECTED NOT DETECTED Final   Metapneumovirus NOT DETECTED NOT DETECTED Final   Rhinovirus / Enterovirus NOT DETECTED NOT DETECTED Final   Influenza A NOT DETECTED NOT DETECTED Final   Influenza B NOT DETECTED NOT DETECTED Final   Parainfluenza Virus 1 NOT DETECTED NOT DETECTED Final   Parainfluenza Virus 2 NOT DETECTED NOT DETECTED Final   Parainfluenza Virus 3 NOT DETECTED NOT DETECTED Final   Parainfluenza Virus 4 NOT DETECTED NOT DETECTED Final   Respiratory Syncytial Virus NOT DETECTED NOT DETECTED Final   Bordetella pertussis NOT DETECTED NOT DETECTED Final   Chlamydophila pneumoniae NOT DETECTED NOT DETECTED Final   Mycoplasma pneumoniae NOT DETECTED NOT DETECTED Final    Comment: Performed at [facility], [place]. [address], [place], [place] [address]         Radiology Studies: No results found.      Scheduled Meds: . diphenhydrAMINE  25 mg Oral QHS   And  . acetaminophen  500 mg Oral QHS  . aspirin EC  81 mg Oral Daily  . azithromycin  250 mg Oral Daily  . brimonidine  1 drop Both Eyes Q12H   And  . timolol  1 drop Both Eyes Q12H  . enoxaparin  (LOVENOX) injection  40 mg Subcutaneous Q24H  . folic acid  1 mg Oral Daily  . furosemide  40 mg Intravenous BID  . insulin aspart  0-5 Units Subcutaneous QHS  . insulin aspart  0-9 Units Subcutaneous TID WC  . insulin aspart  3 Units Subcutaneous TID WC  . insulin detemir  8 Units Subcutaneous BID  . irbesartan  150 mg Oral Daily  . magnesium oxide  400 mg Oral BID  . methylPREDNISolone (SOLU-MEDROL) injection  40 mg Intravenous Q12H  . multivitamin  1 tablet Oral BID  . multivitamin with minerals  1 tablet Oral Daily  . pregabalin  75 mg Oral TID  . spironolactone  12.5 mg Oral Daily  . thiamine  100 mg Oral Daily   Or  . thiamine  100 mg Intravenous Daily   Continuous Infusions: . cefTRIAXone (ROCEPHIN)  IV Stopped (01/19/19 2221)     LOS: 4 days   The patient is critically ill with multiple organ systems failure and requires high complexity decision making for assessment and support, frequent evaluation and titration of therapies, application of advanced monitoring technologies and extensive interpretation of multiple databases. Critical Care Time devoted to patient care services described in this note  Time spent: 40 minutes     Koleen Celia, Geraldo Docker, MD [facility] Pager [phone]  If 7PM-7AM, please contact night-coverage [email or web address] Password [email or web address] 01/20/2019, 7:30 AM

## 2019-01-21 DIAGNOSIS — N390 Urinary tract infection, site not specified: Secondary | ICD-10-CM

## 2019-01-21 DIAGNOSIS — J9600 Acute respiratory failure, unspecified whether with hypoxia or hypercapnia: Secondary | ICD-10-CM

## 2019-01-21 LAB — GLUCOSE, CAPILLARY
Glucose-Capillary: 107 mg/dL — ABNORMAL HIGH (ref 70–99)
Glucose-Capillary: 134 mg/dL — ABNORMAL HIGH (ref 70–99)
Glucose-Capillary: 174 mg/dL — ABNORMAL HIGH (ref 70–99)
Glucose-Capillary: 340 mg/dL — ABNORMAL HIGH (ref 70–99)
Glucose-Capillary: 245 mg/dL — ABNORMAL HIGH (ref 70–99)

## 2019-01-21 LAB — BASIC METABOLIC PANEL
Anion gap: 10 (ref 5–15)
BUN: 45 mg/dL — ABNORMAL HIGH (ref 8–23)
CO2: 33 mmol/L — ABNORMAL HIGH (ref 22–32)
Calcium: 8.3 mg/dL — ABNORMAL LOW (ref 8.9–10.3)
Chloride: 86 mmol/L — ABNORMAL LOW (ref 98–111)
Creatinine, Ser: 1.26 mg/dL — ABNORMAL HIGH (ref 0.61–1.24)
GFR calc Af Amer: 57 mL/min — ABNORMAL LOW (ref >60)
GFR calc non Af Amer: 50 mL/min — ABNORMAL LOW (ref >60)
Glucose, Bld: 111 mg/dL — ABNORMAL HIGH (ref 70–99)
Potassium: 5.1 mmol/L (ref 3.5–5.1)
Sodium: 129 mmol/L — ABNORMAL LOW (ref 135–145)

## 2019-01-21 LAB — CBC
HCT: 36.7 % — ABNORMAL LOW (ref 39.0–52.0)
Hemoglobin: 11.7 g/dL — ABNORMAL LOW (ref 13.0–17.0)
MCH: 29.8 pg (ref 26.0–34.0)
MCHC: 31.9 g/dL (ref 30.0–36.0)
MCV: 93.6 fL (ref 80.0–100.0)
Platelets: 160 10*3/uL (ref 150–400)
RBC: 3.92 MIL/uL — ABNORMAL LOW (ref 4.22–5.81)
RDW: 14.6 % (ref 11.5–15.5)
WBC: 13.7 10*3/uL — ABNORMAL HIGH (ref 4.0–10.5)
nRBC: 0 % (ref 0.0–0.2)

## 2019-01-21 LAB — CULTURE, BLOOD (ROUTINE X 2)
Culture: NO GROWTH
Culture: NO GROWTH
Special Requests: ADEQUATE
Special Requests: ADEQUATE

## 2019-01-21 LAB — MAGNESIUM: Magnesium: 2.1 mg/dL (ref 1.7–2.4)

## 2019-01-21 MED ORDER — IPRATROPIUM-ALBUTEROL 0.5-2.5 (3) MG/3ML IN SOLN: 3.0000 mL | Freq: Four times a day (QID) | RESPIRATORY_TRACT | Status: DC | PRN

## 2019-01-21 MED ORDER — SODIUM CHLORIDE 0.9 % IV BOLUS
500.0000 mL | Freq: Once | INTRAVENOUS | Status: AC
  Administered 2019-01-21: 01:00:00 500 mL via INTRAVENOUS

## 2019-01-21 MED ORDER — SODIUM CHLORIDE 0.9 % IV SOLN
INTRAVENOUS | Status: DC
  Administered 2019-01-21: 15:00:00 via INTRAVENOUS

## 2019-01-21 MED ORDER — ALBUMIN HUMAN 25 % IV SOLN
50.0000 g | Freq: Once | INTRAVENOUS | Status: AC
  Administered 2019-01-21: 50 g via INTRAVENOUS
  Filled 2019-01-21: qty 200

## 2019-01-21 MED ORDER — SODIUM CHLORIDE 0.9 % IV BOLUS: 250.0000 mL | Freq: Once | INTRAVENOUS | Status: DC | PRN

## 2019-01-21 MED ORDER — IPRATROPIUM-ALBUTEROL 0.5-2.5 (3) MG/3ML IN SOLN: 3.0000 mL | Freq: Three times a day (TID) | RESPIRATORY_TRACT | Status: DC

## 2019-01-21 NOTE — Significant Event (Signed)
Rapid Response Event Note  Overview: Called d/t hypotension. Time Called: 2340 Arrival Time: 2352 Event Type: Hypotension  Initial Focused Assessment: Pt laying in bed with eyes closed, awakens to verbal stimuli. Pt is alert and oriented, sleepy, c/o no lightheadness, dizziness, SOB, or chest pain. T-97.6, HR-57 (SB), BP-67/37 > 62/39 >59/43, RR-17, SpO2-96% on 3L [place]. Lungs CTA, skin cool to touch. Pt received 40mg  IV lasix X 2 on day shift with 1050cc of output resulting.  Interventions: EKG-SB 250cc NS bolus X2. Plan of Care (if not transferred): Give NS boluses, repeat BP. Continue to monitor pt. Call RRT if further assistance needed.   Update: 0001-BP>89/43, 0010>84/40, 0016>82/41, 0025>81/42, additional 500cc NS bolus given, BP-0145> 104/50   Event Summary: Name of Physician Notified: Frederik Pear, NP at 2352    at    Outcome: Stayed in room and stabalized  Event End Time: 0015  Dillard [place]

## 2019-01-21 NOTE — Progress Notes (Signed)
PROGRESS NOTE    Nathan Cunningham  XIP:[id number] DOB: Jul 22, 1925 DOA: 01/15/2019 PCP: Lucille Passy, MD   Brief Narrative:  85 y.o. WM PMHx HTN, Atrial flutter, HLD, Diabetes type 2 uncontrolled, anemia, EtOH abuse, OSA on CPAP, Myelomonocytic leukemia,   Presented to the hospital with progressive shortness breath, dry cough, leg edema and dysuria.  Reportedly desaturated to 82% on room air. In ED, hemodynamically stable. Troponin 0.05, BNP 176, negative COVID-19 test, BMP not impressive. Urinalysis concerning for UTI. Chest x-ray showed bilateral mild basilar atelectasis. CTA chest/abdomen/pelvis negative for PE but dilated PA, emphysematous changes, bronchial wall thickening and tree-in-bud opacities suspicious for infectious bronchiolitis, trace bilateral pleural effusion, sigmoid diverticulosis without diverticulitis and symmetric bladder wall thickening. Patient was admitted for possible CAP and UTI.     Subjective: 6/14 overnight rapid response called to patient's bedside secondary to hypotension.    A/O x4, positive acute respiratory failure with hypoxia.  States not on home O2.  States uses CPAP at home.  Believes his base weight is 180 pounds( 81.6 kg) but does not weigh himself daily secondary to feeling unsteady on his feet.   Assessment & Plan:   Principal Problem:   Acute respiratory failure with hypoxia (HCC) Active Problems:   Diabetes mellitus with complication (HCC)   HTN (hypertension)   UTI (urinary tract infection)   OSA (obstructive sleep apnea)   Type 2 diabetes mellitus with diabetic neuropathy, unspecified (HCC)   Bronchiolitis   Alcohol abuse   Lower leg edema   Elevated troponin   Acute respiratory failure (HCC)   Acute hypoxemic respiratory failure/COPD exacerbation -Complete 7-day course antibiotics - Solu-Medrol 40 mg daily - DuoNeb every 6 hours -Mucinex DM twice daily -Flutter valve -Titrate O2 to maintain SPO2 89 to 93% -Ambulatory SPO2  on 6/15  Acute on Chronic diastolic CHF -Admission weight 81.6 kg -Strict in and out +721.4 ml -Daily weightt   Filed Weights   01/19/19 0600 01/20/19 0510 01/21/19 0500  Weight: 82.8 kg 82.2 kg 84.6 kg  -6/14 discontinue Norvasc secondary to hypotension - 6/14 discontinue Lasix IV 40 mg BID believed have overdiuresis patient given his hypotension and increasing creatinine -Hydralazine PRN - Irbesartan 150 mg daily - 6/13 start metoprolol 12.5 mg twice daily - DC Spironolactone patient's heart function, does not indicate need for spironolactone has retained EF. - Would like to start beta-blocker however borderline bradycardia.  Elevated troponin/demand ischemia Recent Labs  Lab 01/15/19 2238 01/16/19 0313 01/16/19 0715 01/16/19 1245  TROPONINI 0.05* 0.04* 0.04* 0.04*  -Slightly elevated but flat, more consistent with demand ischemia   Hypotension - Multifactorial acute on chronic diastolic CHF, hypoalbuminemia, overdiuresis. - Discontinued Lasix,  - Albumin 50 g; following completion will administer normal saline 50 ml/hr   UTI: CT abdomen and pelvis showed a symmetric bladder wall thickening which could be related to UTI but cannot exclude neoplasm.  Urine culture with insignificant growth. Continue Rocephin IV to complete the course..  Patient will need outpatient urology referral to exclude malignancy.   Diabetes type 2 uncontrolled with complication -6/9 hemoglobin A1c= 7.7 -Levemir 8 units twice daily -6/13 increase moderate SSI - 6/13 increase NovoLog 8 units QAC  Hyperkalemia -Most likely secondary to Spironolactone.  Spironolactone has been discontinued, should correct with diuresis continue to monitor. - Potassium goal> 4   Hypomagnesmia - Magnesium goal> 2     EtOH abuse -Patient states has significantly decreased EtOH intake now only drinks wine and occasional beer. -CIWA protocol  DVT prophylaxis: Lovenox Code Status: Full Family Communication:  None Disposition Plan: Discharge in next 24 to 48 hours   Consultants:  None    Procedures/Significant Events:  6/9 echocardiogram:Left Ventricle: LVEF 60-65%. - Left ventricular diastolic Doppler parameters are consistent with impaired relaxation. Elevated left ventricular end-diastolic pressure     I have personally reviewed and interpreted all radiology studies and my findings are as above.  VENTILATOR SETTINGS: None   Cultures 6/8 SARS coronavirus negative 6/9 urine insignificant growth 6/9 blood LEFT arm NGTD 6/9 blood RIGHT forearm NGTD 6/9 MRSA by PCR negative 6/9 respiratory virus panel negative    Antimicrobials: Anti-infectives (From admission, onward)   Start     Stop   01/17/19 1000  azithromycin (ZITHROMAX) tablet 250 mg     01/20/19 0958   01/16/19 2200  cefTRIAXone (ROCEPHIN) 1 g in sodium chloride 0.9 % 100 mL IVPB         01/16/19 1000  azithromycin (ZITHROMAX) tablet 500 mg     01/16/19 0928   01/15/19 2345  cefTRIAXone (ROCEPHIN) 1 g in sodium chloride 0.9 % 100 mL IVPB     01/16/19 0038       Devices    LINES / TUBES:      Continuous Infusions: . cefTRIAXone (ROCEPHIN)  IV 1 g (01/21/19 0024)  . sodium chloride       Objective: Vitals:   01/21/19 0400 01/21/19 0500 01/21/19 0721 01/21/19 0741  BP: (!) 110/49 (!) 108/46  (!) 96/43  Pulse: 63 65    Resp: 16 10    Temp:    97.9 F (36.6 C)  TempSrc:    Oral  SpO2: 97% 98% 98%   Weight:  84.6 kg    Height:        Intake/Output Summary (Last 24 hours) at 01/21/2019 0744 Last data filed at 01/21/2019 0200 Gross per 24 hour  Intake 1756.38 ml  Output 1050 ml  Net 706.38 ml   Filed Weights   01/19/19 0600 01/20/19 0510 01/21/19 0500  Weight: 82.8 kg 82.2 kg 84.6 kg   Physical Exam:  General: A/O x4, positive acute respiratory distress Eyes: negative scleral hemorrhage, negative anisocoria, negative icterus ENT: Negative Runny nose, negative gingival bleeding, Neck:   Negative scars, masses, torticollis, lymphadenopathy, JVD Lungs: Clear to auscultation bilaterally without wheezes or crackles Cardiovascular: Regular rate and rhythm without murmur gallop or rub normal S1 and S2 Abdomen: negative abdominal pain, positive distended but improved, positive soft, bowel sounds, no rebound, no ascites, no appreciable mass Extremities: No significant cyanosis, clubbing, or edema bilateral lower extremities Skin: Negative rashes, lesions, ulcers Psychiatric:  Negative depression, negative anxiety, negative fatigue, negative mania  Central nervous system:  Cranial nerves II through XII intact, tongue/uvula midline, all extremities muscle strength 5/5, sensation intact throughout, negative dysarthria, negative expressive aphasia, negative receptive aphasia.  .     Data Reviewed: Care during the described time interval was provided by me .  I have reviewed this patient's available data, including medical history, events of note, physical examination, and all test results as part of my evaluation.   CBC: Recent Labs  Lab 01/17/19 0238 01/18/19 0241 01/19/19 0804 01/20/19 0245 01/21/19 0233  WBC 9.7 6.9 10.9* 11.0* 13.7*  HGB 11.1* 12.1* 12.4* 12.2* 11.7*  HCT 35.2* 37.3* 37.9* 37.3* 36.7*  MCV 95.9 93.3 93.1 92.1 93.6  PLT 141* 138* 155 160 628   Basic Metabolic Panel: Recent Labs  Lab 01/17/19 0238 01/18/19 0241 01/19/19  4128 01/20/19 0245 01/21/19 0233  NA 134* 132* 132* 130* 129*  K 3.9 4.4 5.1 5.4* 5.1  CL 96* 92* 90* 87* 86*  CO2 33* 31 30 34* 33*  GLUCOSE 143* 245* 243* 196* 111*  BUN 12 16 23  33* 45*  CREATININE 0.76 0.72 0.98 0.98 1.26*  CALCIUM 8.1* 8.6* 8.6* 8.6* 8.3*  MG 1.6* 2.0 1.9 2.0 2.1   GFR: Estimated Creatinine Clearance: 39.7 mL/min (A) (by C-G formula based on SCr of 1.26 mg/dL (H)). Liver Function Tests: Recent Labs  Lab 01/15/19 2238 01/20/19 0245  AST 9* 15  ALT 11 19  ALKPHOS 54 45  BILITOT 0.8 0.7  PROT 7.4 6.9   ALBUMIN 3.2* 2.9*   No results for input(s): LIPASE, AMYLASE in the last 168 hours. No results for input(s): AMMONIA in the last 168 hours. Coagulation Profile: Recent Labs  Lab 01/16/19 0313  INR 1.3*   Cardiac Enzymes: Recent Labs  Lab 01/15/19 2238 01/16/19 0313 01/16/19 0715 01/16/19 1245  TROPONINI 0.05* 0.04* 0.04* 0.04*   BNP (last 3 results) No results for input(s): PROBNP in the last 8760 hours. HbA1C: No results for input(s): HGBA1C in the last 72 hours. CBG: Recent Labs  Lab 01/20/19 2029 01/20/19 2056 01/20/19 2328 01/21/19 0052 01/21/19 0557  GLUCAP 69* 102* 86 107* 134*   Lipid Profile: No results for input(s): CHOL, HDL, LDLCALC, TRIG, CHOLHDL, LDLDIRECT in the last 72 hours. Thyroid Function Tests: No results for input(s): TSH, T4TOTAL, FREET4, T3FREE, THYROIDAB in the last 72 hours. Anemia Panel: No results for input(s): VITAMINB12, FOLATE, FERRITIN, TIBC, IRON, RETICCTPCT in the last 72 hours. Urine analysis:    Component Value Date/Time   COLORURINE YELLOW 01/15/2019 2240   APPEARANCEUR HAZY (A) 01/15/2019 2240   LABSPEC 1.016 01/15/2019 2240   PHURINE 5.0 01/15/2019 2240   GLUCOSEU NEGATIVE 01/15/2019 2240   HGBUR NEGATIVE 01/15/2019 2240   BILIRUBINUR NEGATIVE 01/15/2019 2240   BILIRUBINUR 1 12/26/2017 1124   KETONESUR 5 (A) 01/15/2019 2240   PROTEINUR 100 (A) 01/15/2019 2240   UROBILINOGEN 0.2 12/26/2017 1124   UROBILINOGEN 0.2 03/18/2008 0800   NITRITE POSITIVE (A) 01/15/2019 2240   LEUKOCYTESUR MODERATE (A) 01/15/2019 2240   Sepsis Labs: @LABRCNTIP (procalcitonin:4,lacticidven:4)  ) Recent Results (from the past 240 hour(s))  SARS Coronavirus 2 (CEPHEID- Performed in [place] hospital lab), Hosp Order     Status: None   Collection Time: 01/15/19 11:07 PM   Specimen: Nasopharyngeal Swab  Result Value Ref Range Status   SARS Coronavirus 2 NEGATIVE NEGATIVE Final    Comment: (NOTE) If result is NEGATIVE SARS-CoV-2 target  nucleic acids are NOT DETECTED. The SARS-CoV-2 RNA is generally detectable in upper and lower  respiratory specimens during the acute phase of infection. The lowest  concentration of SARS-CoV-2 viral copies this assay can detect is 250  copies / mL. A negative result does not preclude SARS-CoV-2 infection  and should not be used as the sole basis for treatment or other  patient management decisions.  A negative result may occur with  improper specimen collection / handling, submission of specimen other  than nasopharyngeal swab, presence of viral mutation(s) within the  areas targeted by this assay, and inadequate number of viral copies  (<250 copies / mL). A negative result must be combined with clinical  observations, patient history, and epidemiological information. If result is POSITIVE SARS-CoV-2 target nucleic acids are DETECTED. The SARS-CoV-2 RNA is generally detectable in upper and lower  respiratory specimens dur  ing the acute phase of infection.  Positive  results are indicative of active infection with SARS-CoV-2.  Clinical  correlation with patient history and other diagnostic information is  necessary to determine patient infection status.  Positive results do  not rule out bacterial infection or co-infection with other viruses. If result is PRESUMPTIVE POSTIVE SARS-CoV-2 nucleic acids MAY BE PRESENT.   A presumptive positive result was obtained on the submitted specimen  and confirmed on repeat testing.  While 2019 novel coronavirus  (SARS-CoV-2) nucleic acids may be present in the submitted sample  additional confirmatory testing may be necessary for epidemiological  and / or clinical management purposes  to differentiate between  SARS-CoV-2 and other Sarbecovirus currently known to infect humans.  If clinically indicated additional testing with an alternate test  methodology [phone]) is advised. The SARS-CoV-2 RNA is generally  detectable in upper and lower  respiratory sp ecimens during the acute  phase of infection. The expected result is Negative. Fact Sheet for Patients:  StrictlyIdeas.no Fact Sheet for Healthcare Providers: BankingDealers.co.za This test is not yet approved or cleared by the [place] FDA and has been authorized for detection and/or diagnosis of SARS-CoV-2 by FDA under an Emergency Use Authorization (EUA).  This EUA will remain in effect (meaning this test can be used) for the duration of the COVID-19 declaration under Section 564(b)(1) of the Act, 21 U.S.C. section 360bbb-3(b)(1), unless the authorization is terminated or revoked sooner. Performed at [facility], [place] [address] [place], [place] [address]   Urine Culture     Status: Abnormal   Collection Time: 01/16/19  1:37 AM   Specimen: Urine, Random  Result Value Ref Range Status   Specimen Description URINE, RANDOM  Final   Special Requests NONE  Final   Culture (A)  Final    <10,000 COLONIES/mL INSIGNIFICANT GROWTH Performed at [facility], [place] [address], [place], [place] [address]    Report Status 01/17/2019 FINAL  Final  Culture, blood (Routine X 2) w Reflex to ID Panel     Status: None (Preliminary result)   Collection Time: 01/16/19  3:10 AM   Specimen: BLOOD LEFT ARM  Result Value Ref Range Status   Specimen Description BLOOD LEFT ARM  Final   Special Requests   Final    BOTTLES DRAWN AEROBIC AND ANAEROBIC Blood Culture adequate volume   Culture   Final    NO GROWTH 4 DAYS Performed at [facility], [place] [address], [place], [place] [address]    Report Status PENDING  Incomplete  Culture, blood (Routine X 2) w Reflex to ID Panel     Status: None (Preliminary result)   Collection Time: 01/16/19  3:13 AM   Specimen: BLOOD RIGHT FOREARM  Result Value Ref Range Status   Specimen Description BLOOD RIGHT FOREARM  Final   Special Requests   Final    BOTTLES DRAWN AEROBIC AND  ANAEROBIC Blood Culture adequate volume   Culture   Final    NO GROWTH 4 DAYS Performed at [facility], [place] [address] [place], [place] [address]    Report Status PENDING  Incomplete  MRSA PCR Screening     Status: None   Collection Time: 01/16/19  4:25 AM   Specimen: Nasal Mucosa; Nasopharyngeal  Result Value Ref Range Status   MRSA by PCR NEGATIVE NEGATIVE Final    Comment:        The GeneXpert MRSA Assay (FDA approved for  NASAL specimens only), is one component of a comprehensive MRSA colonization surveillance program. It is not intended to diagnose MRSA infection nor to guide or monitor treatment for MRSA infections. Performed at [facility], [place] [address] [place], [place] [address]   Respiratory Panel by PCR     Status: None   Collection Time: 01/16/19  4:38 AM   Specimen: Nasopharyngeal Swab; Respiratory  Result Value Ref Range Status   Adenovirus NOT DETECTED NOT DETECTED Final   Coronavirus 229E NOT DETECTED NOT DETECTED Final    Comment: (NOTE) The Coronavirus on the Respiratory Panel, DOES NOT test for the novel  Coronavirus (2019 nCoV)    Coronavirus HKU1 NOT DETECTED NOT DETECTED Final   Coronavirus NL63 NOT DETECTED NOT DETECTED Final   Coronavirus OC43 NOT DETECTED NOT DETECTED Final   Metapneumovirus NOT DETECTED NOT DETECTED Final   Rhinovirus / Enterovirus NOT DETECTED NOT DETECTED Final   Influenza A NOT DETECTED NOT DETECTED Final   Influenza B NOT DETECTED NOT DETECTED Final   Parainfluenza Virus 1 NOT DETECTED NOT DETECTED Final   Parainfluenza Virus 2 NOT DETECTED NOT DETECTED Final   Parainfluenza Virus 3 NOT DETECTED NOT DETECTED Final   Parainfluenza Virus 4 NOT DETECTED NOT DETECTED Final   Respiratory Syncytial Virus NOT DETECTED NOT DETECTED Final   Bordetella pertussis NOT DETECTED NOT DETECTED Final   Chlamydophila pneumoniae NOT DETECTED NOT DETECTED Final   Mycoplasma pneumoniae NOT DETECTED NOT DETECTED Final     Comment: Performed at [facility], [place]. [address], [place], [place] [address]         Radiology Studies: No results found.      Scheduled Meds: . diphenhydrAMINE  25 mg Oral QHS   And  . acetaminophen  500 mg Oral QHS  . amLODipine  5 mg Oral Daily  . aspirin EC  81 mg Oral Daily  . brimonidine  1 drop Both Eyes Q12H   And  . timolol  1 drop Both Eyes Q12H  . enoxaparin (LOVENOX) injection  40 mg Subcutaneous Q24H  . folic acid  1 mg Oral Daily  . furosemide  40 mg Intravenous BID  . insulin aspart  0-15 Units Subcutaneous Q4H  . insulin aspart  0-5 Units Subcutaneous QHS  . insulin aspart  8 Units Subcutaneous TID WC  . insulin detemir  8 Units Subcutaneous BID  . ipratropium-albuterol  3 mL Nebulization Q6H  . irbesartan  150 mg Oral Daily  . magnesium oxide  400 mg Oral BID  . methylPREDNISolone (SOLU-MEDROL) injection  40 mg Intravenous Daily  . multivitamin  1 tablet Oral BID  . multivitamin with minerals  1 tablet Oral Daily  . pregabalin  75 mg Oral TID  . thiamine  100 mg Oral Daily   Continuous Infusions: . cefTRIAXone (ROCEPHIN)  IV 1 g (01/21/19 0024)  . sodium chloride       LOS: 5 days   The patient is critically ill with multiple organ systems failure and requires high complexity decision making for assessment and support, frequent evaluation and titration of therapies, application of advanced monitoring technologies and extensive interpretation of multiple databases. Critical Care Time devoted to patient care services described in this note  Time spent: 40 minutes     Judea Fennimore, Geraldo Docker, MD [facility] Pager [phone]  If 7PM-7AM, please contact night-coverage [email or web address] Password [facility] 01/21/2019, 7:44 AM

## 2019-01-21 NOTE — Progress Notes (Signed)
Hypoglycemic Event  CBG: 69  Treatment: 4 Oz orange juice Symptoms: NoneFollow-up CBG: Time:2056 CBG Result:102  Possible Reasons for Event: Insulin dosing  Comments/MD notified:     Marcie Mowers

## 2019-01-21 NOTE — Progress Notes (Signed)
Patient offered CPAP to use while in the hospital but he declined.

## 2019-01-21 NOTE — Progress Notes (Signed)
   01/20/19 2327  Vitals  Temp 97.6 F (36.4 C)  Temp Source Oral  BP (!) 67/37  MAP (mmHg) (!) 48  BP Location Left Arm  BP Method Automatic  Patient Position (if appropriate) Lying  Pulse Rate (!) 55  Pulse Rate Source Monitor  ECG Heart Rate (!) 57  Resp 17  Oxygen Therapy  SpO2 96 %  O2 Device Nasal Cannula  O2 Flow Rate (L/min) 3 L/min   At 2327, patient's BP dropped to 67/37 with a HR in the 50's. There was no change in the assessment and patient only complained of indigestion discomfort in the left epigastrium. Maalox 30 ml given and rapid response and hospitalist paged. An order of a total of 1L NS received and administered. New EKG showed sinus brady. Patient currently in SR with BP stable.

## 2019-01-22 LAB — GLUCOSE, CAPILLARY
Glucose-Capillary: 114 mg/dL — ABNORMAL HIGH (ref 70–99)
Glucose-Capillary: 166 mg/dL — ABNORMAL HIGH (ref 70–99)
Glucose-Capillary: 169 mg/dL — ABNORMAL HIGH (ref 70–99)
Glucose-Capillary: 172 mg/dL — ABNORMAL HIGH (ref 70–99)
Glucose-Capillary: 247 mg/dL — ABNORMAL HIGH (ref 70–99)
Glucose-Capillary: 90 mg/dL (ref 70–99)

## 2019-01-22 LAB — CBC
HCT: 37.8 % — ABNORMAL LOW (ref 39.0–52.0)
Hemoglobin: 12 g/dL — ABNORMAL LOW (ref 13.0–17.0)
MCH: 30.1 pg (ref 26.0–34.0)
MCHC: 31.7 g/dL (ref 30.0–36.0)
MCV: 94.7 fL (ref 80.0–100.0)
Platelets: 148 10*3/uL — ABNORMAL LOW (ref 150–400)
RBC: 3.99 MIL/uL — ABNORMAL LOW (ref 4.22–5.81)
RDW: 14.3 % (ref 11.5–15.5)
WBC: 9.7 10*3/uL (ref 4.0–10.5)
nRBC: 0 % (ref 0.0–0.2)

## 2019-01-22 LAB — BASIC METABOLIC PANEL
Anion gap: 10 (ref 5–15)
BUN: 36 mg/dL — ABNORMAL HIGH (ref 8–23)
CO2: 32 mmol/L (ref 22–32)
Calcium: 8.7 mg/dL — ABNORMAL LOW (ref 8.9–10.3)
Chloride: 92 mmol/L — ABNORMAL LOW (ref 98–111)
Creatinine, Ser: 0.95 mg/dL (ref 0.61–1.24)
GFR calc Af Amer: >60 mL/min (ref >60)
GFR calc non Af Amer: >60 mL/min (ref >60)
Glucose, Bld: 212 mg/dL — ABNORMAL HIGH (ref 70–99)
Potassium: 4.8 mmol/L (ref 3.5–5.1)
Sodium: 134 mmol/L — ABNORMAL LOW (ref 135–145)

## 2019-01-22 LAB — MAGNESIUM: Magnesium: 2.3 mg/dL (ref 1.7–2.4)

## 2019-01-22 MED ORDER — THIAMINE HCL 100 MG PO TABS: 100.0000 mg | ORAL_TABLET | Freq: Every day | ORAL | 0 refills | Status: AC

## 2019-01-22 MED ORDER — FOLIC ACID 1 MG PO TABS: 1.0000 mg | ORAL_TABLET | Freq: Every day | ORAL | 0 refills | Status: AC

## 2019-01-22 MED ORDER — IRBESARTAN 150 MG PO TABS: 150.0000 mg | ORAL_TABLET | Freq: Every day | ORAL | 0 refills | Status: DC

## 2019-01-22 MED ORDER — DM-GUAIFENESIN ER 30-600 MG PO TB12: 1.0000 | ORAL_TABLET | Freq: Two times a day (BID) | ORAL | 0 refills | Status: DC | PRN

## 2019-01-22 MED FILL — IRBESARTAN 150 MG TABLET: 150 | 30 days supply | Qty: 30 | Fill #0

## 2019-01-22 MED FILL — FOLIC ACID 1 MG TABS: 1 | 30 days supply | Qty: 30 | Fill #0

## 2019-01-22 MED FILL — MUCUS RELIEF DM 30-600 MG T: 30-600 | 15 days supply | Qty: 30 | Fill #0

## 2019-01-22 MED FILL — THIAMINE HCL 100 MG TABS: 100 | 30 days supply | Qty: 30 | Fill #0

## 2019-01-22 NOTE — Progress Notes (Signed)
   01/22/19 2126  AVS Discharge Documentation  AVS Discharge Instructions Including Medications Placed in discharge packet for receiving facility;Provided to patient/caregiver  Name of Person Receiving AVS Discharge Instructions Including Medications [name]  Name of Clinician That Reviewed AVS Discharge Instructions Including Medications Mikey Bussing, RN

## 2019-01-22 NOTE — Progress Notes (Signed)
All set for discharge, arrangement made with [facility] for transport.o2 tank been delivered at home.Wheelchair to be delivered in am as per care.mgt. pt's companion made aware.

## 2019-01-22 NOTE — Plan of Care (Signed)

## 2019-01-22 NOTE — Care Management (Signed)
Patient suffers from weakness which impairs their ability to perform daily activities like in the home. A walking aid will not resolve issue with performing activities of daily living. A wheelchair will allow patient to safely perform daily activities. Patient can safely propel the wheelchair in the home or has a caregiver who can provide assistance. Length of need Accessories: elevating leg rests (ELRs), wheel locks, extensions and anti-tippers.

## 2019-01-22 NOTE — Progress Notes (Signed)
Physical Therapy Treatment Patient Details Name: Nathan Cunningham MRN: [id number] DOB: 1925/04/06 Today's Date: 01/22/2019    History of Present Illness This 85 y.o. male admitted with SOB, dry cough, LE edema and dysuria.  02 sats 82% on RA.   Dx:  Acute respiratory failure wtih hypoxemia, possible pulmonary hypertension, UTI.  PMH includes:  OSA, HTN, NIDDM, alcohol use disorder     PT Comments    Patient seen for mobility progression with nursing requesting to assess SpO2 with mobility. Patient requiring overall Min A with transfers and Min guard with gait with rollator and 2L O2 via [place]. Patient requiring 1 seated rest break during mobility with patient stating he feels somewhat weaker today compared to other days. Patient with SpO2 88-92% on 2L with mobility - will likely need supplemental O2 at discharge. PT to continue to follow.     Follow Up Recommendations  Home health PT;Supervision/Assistance - 24 hour     Equipment Recommendations  None recommended by PT    Recommendations for Other Services       Precautions / Restrictions Precautions Precautions: Fall Precaution Comments: Pt denies h/o falls  Restrictions Weight Bearing Restrictions: No    Mobility  Bed Mobility Overal bed mobility: Needs Assistance Bed Mobility: Supine to Sit     Supine to sit: Min assist     General bed mobility comments: patient able to self guide LE to EOB - requires Min A to pull up on PT hand to come to EOB; use of bed rails  Transfers Overall transfer level: Needs assistance Equipment used: Rolling walker (2 wheeled) Transfers: Sit to/from Stand Sit to Stand: Min assist         General transfer comment: overall Min A to stand from bedside and recliner; patient reports increased weakness today  Ambulation/Gait Ambulation/Gait assistance: Min guard Gait Distance (Feet): 80 Feet(x2 with 1 seated rest break) Assistive device: 4-wheeled walker Gait Pattern/deviations:  Step-through pattern;Decreased stride length;Trunk flexed Gait velocity: decreased   General Gait Details: trunk flexed throughout mobility; cueing for safety with gait and AD; use of 2L O2 via [place] with SpO2 88-93% throughout mobility   Stairs             Wheelchair Mobility    Modified Rankin (Stroke Patients Only)       Balance Overall balance assessment: Needs assistance Sitting-balance support: Single extremity supported;Feet supported Sitting balance-Leahy Scale: Fair     Standing balance support: Bilateral upper extremity supported;During functional activity Standing balance-Leahy Scale: Fair Standing balance comment: statically fair, but needs AD for dynamic mobility.                            Cognition Arousal/Alertness: Awake/alert Behavior During Therapy: WFL for tasks assessed/performed Overall Cognitive Status: Within Functional Limits for tasks assessed                                        Exercises      General Comments        Pertinent Vitals/Pain Pain Assessment: No/denies pain    Home Living                      Prior Function            PT Goals (current goals can now be found in the care plan  section) Acute Rehab PT Goals Patient Stated Goal: return home PT Goal Formulation: With patient Time For Goal Achievement: 01/31/19 Potential to Achieve Goals: Good Progress towards PT goals: Progressing toward goals    Frequency    Min 3X/week      PT Plan Current plan remains appropriate    Co-evaluation              AM-PAC PT "6 Clicks" Mobility   Outcome Measure  Help needed turning from your back to your side while in a flat bed without using bedrails?: A Little Help needed moving from lying on your back to sitting on the side of a flat bed without using bedrails?: A Little Help needed moving to and from a bed to a chair (including a wheelchair)?: A Little Help needed standing up  from a chair using your arms (e.g., wheelchair or bedside chair)?: A Little Help needed to walk in hospital room?: A Little Help needed climbing 3-5 steps with a railing? : A Lot 6 Click Score: 17    End of Session Equipment Utilized During Treatment: Gait belt;Oxygen Activity Tolerance: Patient tolerated treatment well Patient left: in chair;with call bell/phone within reach Nurse Communication: Mobility status PT Visit Diagnosis: Unsteadiness on feet (R26.81);Muscle weakness (generalized) (M62.81);Other abnormalities of gait and mobility (R26.89)     Time: 2500-3704 PT Time Calculation (min) (ACUTE ONLY): 18 min  Charges:  $Gait Training: 8-22 mins                     Lanney Gins, PT, DPT Supplemental Physical Therapist 01/22/19 12:08 PM Pager: [phone] Office: [phone]

## 2019-01-22 NOTE — Progress Notes (Signed)
Occupational Therapy Treatment Patient Details Name: Nathan Cunningham MRN: [id number] DOB: 16-Feb-1925 Today's Date: 01/22/2019    History of present illness This 85 y.o. male admitted with SOB, dry cough, LE edema and dysuria.  02 sats 82% on RA.   Dx:  Acute respiratory failure wtih hypoxemia, possible pulmonary hypertension, UTI.  PMH includes:  OSA, HTN, NIDDM, alcohol use disorder    OT comments  Patient progressing slowly.  Requires min assist for transfers, cueing for hand placement and safety.  Grooming seated at sink with setup assist, as fatigued from mobility.  Uses 2L supplemental oxygen and educated on PLB techniques to maintain saturations during mobility.  Will follow.   Follow Up Recommendations  Home health OT;Supervision/Assistance - 24 hour    Equipment Recommendations  None recommended by OT    Recommendations for Other Services      Precautions / Restrictions Precautions Precautions: Fall Precaution Comments: Pt denies h/o falls  Restrictions Weight Bearing Restrictions: No       Mobility Bed Mobility Overal bed mobility: Needs Assistance Bed Mobility: Supine to Sit     Supine to sit: Min assist     General bed mobility comments: EOB upon entry  Transfers Overall transfer level: Needs assistance Equipment used: Rolling walker (2 wheeled) Transfers: Sit to/from Stand Sit to Stand: Min assist         General transfer comment: overall Min A to stand from bedside and recliner; patient reports increased weakness today    Balance Overall balance assessment: Needs assistance Sitting-balance support: No upper extremity supported;Feet supported Sitting balance-Leahy Scale: Fair     Standing balance support: Bilateral upper extremity supported;During functional activity Standing balance-Leahy Scale: Fair Standing balance comment: statically fair, but needs AD for dynamic mobility.                           ADL either performed or  assessed with clinical judgement   ADL Overall ADL's : Needs assistance/impaired     Grooming: Wash/dry hands;Wash/dry face;Oral care;Brushing hair;Sitting Grooming Details (indicate cue type and reason): sitting after mobility, due to fatigue                 Toilet Transfer: Minimal assistance;Ambulation(rollator ) [employer] technical [employer] Details (indicate cue type and reason): simulated to recliner          Functional mobility during ADLs: Minimal assistance(rollator)       Vision       Perception     Praxis      Cognition Arousal/Alertness: Awake/alert Behavior During Therapy: WFL for tasks assessed/performed Overall Cognitive Status: Within Functional Limits for tasks assessed                                          Exercises     Shoulder Instructions       General Comments Pt on 2L supplemental oxgyen via [place], educated on PLB with saturations decreased to 89% during mobility and increases to 92% after PLB    Pertinent Vitals/ Pain       Pain Assessment: No/denies pain  Home Living                                          Prior Functioning/Environment  Frequency  Min 2X/week        Progress Toward Goals  OT Goals(current goals can now be found in the care plan section)  Progress towards OT goals: Progressing toward goals  Acute Rehab OT Goals Patient Stated Goal: return home OT Goal Formulation: With patient  Plan Discharge plan remains appropriate;Frequency remains appropriate    Co-evaluation                 AM-PAC OT "6 Clicks" Daily Activity     Outcome Measure   Help from another person eating meals?: None Help from another person taking care of personal grooming?: A Little Help from another person toileting, which includes using toliet, bedpan, or urinal?: A Little Help from another person bathing (including washing, rinsing, drying)?: A Little Help from another person to put  on and taking off regular upper body clothing?: None Help from another person to put on and taking off regular lower body clothing?: A Little 6 Click Score: 20    End of Session Equipment Utilized During Treatment: Oxygen;Gait belt(rollator)  OT Visit Diagnosis: Unsteadiness on feet (R26.81)   Activity Tolerance Patient tolerated treatment well   Patient Left in chair;with call bell/phone within reach;with chair alarm set   Nurse Communication Mobility status        Time: 1135-1156 OT Time Calculation (min): 21 min  Charges: OT General Charges $OT Visit: 1 Visit OT Treatments $Self Care/Home Management : 8-22 mins  Delight Stare, OT Acute Rehabilitation Services Pager [phone] Office [phone]    Delight Stare 01/22/2019, 1:08 PM

## 2019-01-22 NOTE — Discharge Summary (Signed)
Physician Discharge Summary  Nathan Cunningham SEG:[id number] DOB: [birth year] DOA: 01/15/2019  PCP: Lucille Passy, MD  Admit date: 01/15/2019 Discharge date: 01/22/2019  Time spent: 35 minutes  Recommendations for Outpatient Follow-up:   Acute hypoxemic respiratory failure/COPD exacerbation -Completed 7-day course antibiotics -Mucinex DM twice daily PRN -Flutter valve -Titrate O2 to maintain SPO2 89 to 93% -Ambulatory SPO2 on 6/15 SATURATION QUALIFICATIONS: (This note is used to comply with regulatory documentation for home oxygen) Patient Saturations on Room Air at Rest = 89% Patient Saturations on Room Air while Ambulating = 87% Patient Saturations on 2 Liters of oxygen while Ambulating =88-92% Please briefly explain why patient needs home oxygen: pt requires o2  inhalation  -2 L O2 via [place].  Titrate to maintain SPO2 89 to 93%  -Recommended a stay at SNF for rehabilitation but patient stated he has a wife at home to help him perform exercises and refused.   Acute on Chronic diastolic CHF -Admission weight 81.6 kg -Strict in and out -2.5 L -Daily weightt         Filed Weights    01/19/19 0600 01/20/19 0510 01/21/19 0500  Weight: 82.8 kg 82.2 kg 84.6 kg  -6/14 discontinue Norvasc secondary to hypotension - 6/14 discontinue Lasix IV 40 mg BID believed have overdiuresis patient given his hypotension and increasing creatinine - Irbesartan 150 mg daily - 6/13 start metoprolol 12.5 mg twice daily - DC Spironolactone patient's heart function, does not indicate need for spironolactone has retained EF.   Elevated troponin/demand ischemia Last Labs         Recent Labs  Lab 01/15/19 2238 01/16/19 0313 01/16/19 0715 01/16/19 1245  TROPONINI 0.05* 0.04* 0.04* 0.04*    -Slightly elevated but flat, more consistent with demand ischemia    Hypotension - Multifactorial acute on chronic diastolic CHF, hypoalbuminemia, overdiuresis. - Treated underlying causes   UTI:  -CT abdomen  and pelvis showed a symmetric bladder wall thickening which could be related to UTI but cannot exclude neoplasm.  Urine culture with insignificant growth. Continue Rocephin IV to complete the course..   -Patient will need outpatient urology referral to exclude malignancy.  Patient to schedule appointment in 1 week with Dr. Maryan Puls urology   Diabetes type 2 uncontrolled with complication -6/9 hemoglobin A1c= 7.7 -Restart home medication.  Allow PCP to adjust diabetic medication.   Hyperkalemia -Most likely secondary to Spironolactone.  Spironolactone has been discontinued, and hyperkalemia has resolved.  - Potassium goal> 4   Hypomagnesmia - Magnesium goal> 2     EtOH abuse -Patient states has significantly decreased EtOH intake now only drinks wine and occasional beer. -Counseled patient he needed to discontinue drinking altogether.    Discharge Diagnoses:  Principal Problem:   Acute respiratory failure with hypoxia (HCC) Active Problems:   Diabetes mellitus with complication (HCC)   HTN (hypertension)   UTI (urinary tract infection)   OSA (obstructive sleep apnea)   Type 2 diabetes mellitus with diabetic neuropathy, unspecified (HCC)   Bronchiolitis   Alcohol abuse   Lower leg edema   Elevated troponin   Acute respiratory failure ([place])   Discharge Condition: Weak but stable  Diet recommendation: Heart healthy  Filed Weights   01/20/19 0510 01/21/19 0500 01/22/19 0344  Weight: 82.2 kg 84.6 kg 85.4 kg    History of present illness:  85 y.o. WM PMHx HTN, Atrial flutter, HLD, Diabetes type 2 uncontrolled, anemia, EtOH abuse, OSA on CPAP, Myelomonocytic leukemia,    Presented to the hospital  with progressive shortness breath, dry cough, leg edema and dysuria.  Reportedly desaturated to 82% on room air. In ED, hemodynamically stable. Troponin 0.05, BNP 176, negative COVID-19 test, BMP not impressive. Urinalysis concerning for UTI. Chest x-ray showed bilateral mild  basilar atelectasis. CTA chest/abdomen/pelvis negative for PE but dilated PA, emphysematous changes, bronchial wall thickening and tree-in-bud opacities suspicious for infectious bronchiolitis, trace bilateral pleural effusion, sigmoid diverticulosis without diverticulitis and symmetric bladder wall thickening. Patient was admitted for possible CAP and UTI.    Hospital Course:    Hospitalization patient treated for COPD exacerbation, acute respiratory failure with hypoxia, and acute on chronic diastolic CHF.  With appropriate antibiotics, supportive care, and diuresis patient is done well.  Unfortunately patient continues to have O2 requirement and qualifies for home O2 by the [facility] guidelines  Procedures: 6/9 echocardiogram:Left Ventricle: LVEF 60-65%. - Left ventricular diastolic Doppler parameters are consistent with impaired relaxation. Elevated left ventricular end-diastolic pressure  Consultations: None  Cultures  6/8 SARS coronavirus negative 6/9 urine insignificant growth 6/9 blood LEFT arm NGTD 6/9 blood RIGHT forearm NGTD 6/9 MRSA by PCR negative 6/9 respiratory virus panel negative   Antibiotics Anti-infectives (From admission, onward)   Start     Stop   01/17/19 1000  azithromycin (ZITHROMAX) tablet 250 mg     01/20/19 0958   01/16/19 2200  cefTRIAXone (ROCEPHIN) 1 g in sodium chloride 0.9 % 100 mL IVPB  Status:  Discontinued     01/23/19 0106   01/16/19 1000  azithromycin (ZITHROMAX) tablet 500 mg     01/16/19 0928   01/15/19 2345  cefTRIAXone (ROCEPHIN) 1 g in sodium chloride 0.9 % 100 mL IVPB     01/16/19 0038       Discharge Exam: Vitals:   01/21/19 2300 01/22/19 0344 01/22/19 0729 01/22/19 1100  BP: (!) 127/59 (!) 127/58 124/74 (!) 123/57  Pulse: 68 72 75 68  Resp: 17 14 19 16   Temp: 97.6 F (36.4 C) 97.6 F (36.4 C) (!) 97.2 F (36.2 C) 98.6 F (37 C)  TempSrc: Oral Oral Axillary Oral  SpO2: 92% 99% 95% 93%  Weight:  85.4 kg    Height:         General: A/O x4, positive acute respiratory distress Eyes: negative scleral hemorrhage, negative anisocoria, negative icterus ENT: Negative Runny nose, negative gingival bleeding, Neck:  Negative scars, masses, torticollis, lymphadenopathy, JVD Lungs: Clear to auscultation bilaterally without wheezes or crackles Cardiovascular: Regular rate and rhythm without murmur gallop or rub normal S1 and S2    Discharge Instructions   Allergies as of 01/22/2019   No Known Allergies     Medication List    STOP taking these medications   telmisartan 40 MG tablet Commonly known as: MICARDIS Replaced by: irbesartan 150 MG tablet     TAKE these medications   aspirin 325 MG tablet Take 162.5 mg by mouth daily.   Combigan 0.2-0.5 % ophthalmic solution Generic drug: brimonidine-timolol Place 1 drop into both eyes every 12 (twelve) hours.   cyanocobalamin 1000 MCG/ML injection Commonly known as: (VITAMIN B-12) Inject 1021mcg every 60days IM (every other month)   dextromethorphan-guaiFENesin 30-600 MG 12hr tablet Commonly known as: MUCINEX DM Take 1 tablet by mouth 2 (two) times daily as needed for cough.   diphenhydramine-acetaminophen 25-500 MG Tabs tablet Commonly known as: TYLENOL PM Take 1 tablet by mouth daily.   Eye-Vites Tabs Take 1 tablet by mouth 2 (two) times daily.   folic acid 1 MG  tablet Commonly known as: FOLVITE Take 1 tablet (1 mg total) by mouth daily. Start taking on: January 23, 2019   glucose blood test strip Commonly known as: ONE TOUCH ULTRA TEST CHECK BLOOD SUGAR ONCE DAILY AND AS DIRECTED.   irbesartan 150 MG tablet Commonly known as: AVAPRO Take 1 tablet (150 mg total) by mouth daily. Start taking on: January 23, 2019 Replaces: telmisartan 40 MG tablet   Januvia 100 MG tablet Generic drug: sitaGLIPtin TAKE 1 TABLET BY MOUTH DAILY What changed: how much to take   Kombiglyze XR 12-998 MG Tb24 Generic drug: Saxagliptin-Metformin TAKE 1 TABLET BY  MOUTH DAILY   pregabalin 75 MG capsule Commonly known as: LYRICA TAKE ONE CAPSULE BY MOUTH TWICE DAILY. GENERIC EQUIVALENT FOR LYRICA. What changed: See the new instructions.   RA Chromium Picolinate 400 MCG Tabs Generic drug: Chromium Picolinate Take 1 tablet by mouth daily.   thiamine 100 MG tablet Take 1 tablet (100 mg total) by mouth daily. Start taking on: January 23, 2019            Durable Medical Equipment  (From admission, onward)         Start     Ordered   01/22/19 1349  For home use only DME oxygen  Once    Comments: SATURATION QUALIFICATIONS: (This note is used to comply with regulatory documentation for home oxygen) Patient Saturations on Room Air at Rest = 89% Patient Saturations on Room Air while Ambulating = 87% Patient Saturations on 2 Liters of oxygen while Ambulating =88-92% Please briefly explain why patient needs home oxygen: pt requires o2  inhalation  -2 L O2 via [place].  Titrate to maintain SPO2 89 to 92% t  Question Answer Comment  Length of Need 6 Months   Mode or (Route) Nasal cannula   Liters per Minute 2   Frequency Continuous (stationary and portable oxygen unit needed)   Oxygen conserving device Yes   Oxygen delivery system Gas      01/22/19 1349         No Known Allergies Follow-up Information    Royston Cowper, MD. Schedule an appointment as soon as possible for a visit in 1 week(s).   Specialty: Urology Contact information: [place] [address] [phone]            The results of significant diagnostics from this hospitalization (including imaging, microbiology, ancillary and laboratory) are listed below for reference.    Significant Diagnostic Studies: Dg Chest 2 View  Result Date: 01/15/2019 CLINICAL DATA:  Shortness of breath. EXAM: CHEST - 2 VIEW COMPARISON:  Radiographs of April 20, 2016. FINDINGS: Stable cardiomediastinal silhouette. No pneumothorax is noted. No significant pleural effusion is  noted. Mild bibasilar opacities are noted which may represent subsegmental atelectasis or possibly scarring. Bony thorax is unremarkable. IMPRESSION: Mild bibasilar subsegmental atelectasis or scarring. Electronically Signed   By: Marijo Conception M.D.   On: 01/15/2019 15:57   Ct Angio Chest Pe W And/or Wo Contrast  Result Date: 01/16/2019 CLINICAL DATA:  Shortness of breath. EXAM: CT ANGIOGRAPHY CHEST CT ABDOMEN AND PELVIS WITH CONTRAST TECHNIQUE: Multidetector CT imaging of the chest was performed using the standard protocol during bolus administration of intravenous contrast. Multiplanar CT image reconstructions and MIPs were obtained to evaluate the vascular anatomy. Multidetector CT imaging of the abdomen and pelvis was performed using the standard protocol during bolus administration of intravenous contrast. CONTRAST:  136mL OMNIPAQUE IOHEXOL 350 MG/ML SOLN COMPARISON:  CT dated 05/10/2011. FINDINGS: CTA CHEST FINDINGS Cardiovascular: The heart size is enlarged. The main pulmonary artery is dilated measuring up to approximately 4 cm. Coronary artery calcifications are noted. There is no PE. Aortic calcifications are noted. Mediastinum/Nodes: Is mild nonspecific enlargement of mediastinal and hilar lymph nodes bilaterally. There are calcified left hilar lymph nodes which are likely related to prior granulomatous infection. Lungs/Pleura: Emphysematous changes are noted bilaterally. There are few tree-in-bud opacities at the right and scattered throughout the left upper lobe. The lung volumes are somewhat low. There is atelectasis at the lung bases. There are trace bilateral pleural effusions. There is bronchial wall thickening and mucus plugging involving the bilateral lower lobes. Musculoskeletal: There is a heterogeneous appearance of the osseous structures favored to be secondary to heterogeneous osteopenia. Review of the MIP images confirms the above findings. CT ABDOMEN and PELVIS FINDINGS Hepatobiliary:  No focal liver abnormality is seen. No gallstones, gallbladder wall thickening, or biliary dilatation. Pancreas: Unremarkable. No pancreatic ductal dilatation or surrounding inflammatory changes. Spleen: Multiple calcifications are noted throughout the spleen likely related to prior granulomatous infection. Adrenals/Urinary Tract: There is extensive retroperitoneal fat displacing the mesentery and small bowel anteriorly. There is no hydronephrosis. There is no evidence of a radiopaque obstructing kidney stone. There is some mild diffuse asymmetric bladder wall thickening. Stomach/Bowel: There is scattered colonic diverticula without definite CT evidence of diverticulitis. The appendix is not reliably identified. The stomach is unremarkable. There is no evidence of a small-bowel obstruction. Vascular/Lymphatic: Aortic atherosclerosis. No enlarged abdominal or pelvic lymph nodes. Reproductive: Prostate is unremarkable. Other: There is free fluid in the pelvis. There is a small fat containing left inguinal hernia. There is a trace amount of free fluid in the upper abdomen. Musculoskeletal: No acute or significant osseous findings. Review of the MIP images confirms the above findings. IMPRESSION: 1. No PE identified. Main pulmonary artery is dilated which can be seen in patients with elevated PA pressures 2. Emphysematous changes bilaterally with areas of bronchial wall thickening and tree-in-bud opacity suspicious for infectious bronchiolitis. There are trace bilateral pleural effusions with adjacent atelectasis. 3. Sigmoid diverticulosis without CT evidence of diverticulitis. 4. Asymmetric bladder wall thickening. Outpatient urology follow-up is recommended as an underlying neoplasm is not excluded. 5. Trace amount of free fluid in the patient's pelvis and upper abdomen. Aortic Atherosclerosis (ICD10-I70.0) and Emphysema (ICD10-J43.9). Electronically Signed   By: Constance Holster M.D.   On: 01/16/2019 00:25   Ct  Abdomen Pelvis W Contrast  Result Date: 01/16/2019 CLINICAL DATA:  Shortness of breath. EXAM: CT ANGIOGRAPHY CHEST CT ABDOMEN AND PELVIS WITH CONTRAST TECHNIQUE: Multidetector CT imaging of the chest was performed using the standard protocol during bolus administration of intravenous contrast. Multiplanar CT image reconstructions and MIPs were obtained to evaluate the vascular anatomy. Multidetector CT imaging of the abdomen and pelvis was performed using the standard protocol during bolus administration of intravenous contrast. CONTRAST:  164mL OMNIPAQUE IOHEXOL 350 MG/ML SOLN COMPARISON:  CT dated 05/10/2011. FINDINGS: CTA CHEST FINDINGS Cardiovascular: The heart size is enlarged. The main pulmonary artery is dilated measuring up to approximately 4 cm. Coronary artery calcifications are noted. There is no PE. Aortic calcifications are noted. Mediastinum/Nodes: Is mild nonspecific enlargement of mediastinal and hilar lymph nodes bilaterally. There are calcified left hilar lymph nodes which are likely related to prior granulomatous infection. Lungs/Pleura: Emphysematous changes are noted bilaterally. There are few tree-in-bud opacities at the right and scattered throughout the left upper lobe. The lung volumes are somewhat  low. There is atelectasis at the lung bases. There are trace bilateral pleural effusions. There is bronchial wall thickening and mucus plugging involving the bilateral lower lobes. Musculoskeletal: There is a heterogeneous appearance of the osseous structures favored to be secondary to heterogeneous osteopenia. Review of the MIP images confirms the above findings. CT ABDOMEN and PELVIS FINDINGS Hepatobiliary: No focal liver abnormality is seen. No gallstones, gallbladder wall thickening, or biliary dilatation. Pancreas: Unremarkable. No pancreatic ductal dilatation or surrounding inflammatory changes. Spleen: Multiple calcifications are noted throughout the spleen likely related to prior  granulomatous infection. Adrenals/Urinary Tract: There is extensive retroperitoneal fat displacing the mesentery and small bowel anteriorly. There is no hydronephrosis. There is no evidence of a radiopaque obstructing kidney stone. There is some mild diffuse asymmetric bladder wall thickening. Stomach/Bowel: There is scattered colonic diverticula without definite CT evidence of diverticulitis. The appendix is not reliably identified. The stomach is unremarkable. There is no evidence of a small-bowel obstruction. Vascular/Lymphatic: Aortic atherosclerosis. No enlarged abdominal or pelvic lymph nodes. Reproductive: Prostate is unremarkable. Other: There is free fluid in the pelvis. There is a small fat containing left inguinal hernia. There is a trace amount of free fluid in the upper abdomen. Musculoskeletal: No acute or significant osseous findings. Review of the MIP images confirms the above findings. IMPRESSION: 1. No PE identified. Main pulmonary artery is dilated which can be seen in patients with elevated PA pressures 2. Emphysematous changes bilaterally with areas of bronchial wall thickening and tree-in-bud opacity suspicious for infectious bronchiolitis. There are trace bilateral pleural effusions with adjacent atelectasis. 3. Sigmoid diverticulosis without CT evidence of diverticulitis. 4. Asymmetric bladder wall thickening. Outpatient urology follow-up is recommended as an underlying neoplasm is not excluded. 5. Trace amount of free fluid in the patient's pelvis and upper abdomen. Aortic Atherosclerosis (ICD10-I70.0) and Emphysema (ICD10-J43.9). Electronically Signed   By: Constance Holster M.D.   On: 01/16/2019 00:25    Microbiology: Recent Results (from the past 240 hour(s))  SARS Coronavirus 2 (CEPHEID- Performed in [facility] hospital lab), Hosp Order     Status: None   Collection Time: 01/15/19 11:07 PM   Specimen: Nasopharyngeal Swab  Result Value Ref Range Status   SARS Coronavirus 2  NEGATIVE NEGATIVE Final    Comment: (NOTE) If result is NEGATIVE SARS-CoV-2 target nucleic acids are NOT DETECTED. The SARS-CoV-2 RNA is generally detectable in upper and lower  respiratory specimens during the acute phase of infection. The lowest  concentration of SARS-CoV-2 viral copies this assay can detect is 250  copies / mL. A negative result does not preclude SARS-CoV-2 infection  and should not be used as the sole basis for treatment or other  patient management decisions.  A negative result may occur with  improper specimen collection / handling, submission of specimen other  than nasopharyngeal swab, presence of viral mutation(s) within the  areas targeted by this assay, and inadequate number of viral copies  (<250 copies / mL). A negative result must be combined with clinical  observations, patient history, and epidemiological information. If result is POSITIVE SARS-CoV-2 target nucleic acids are DETECTED. The SARS-CoV-2 RNA is generally detectable in upper and lower  respiratory specimens dur ing the acute phase of infection.  Positive  results are indicative of active infection with SARS-CoV-2.  Clinical  correlation with patient history and other diagnostic information is  necessary to determine patient infection status.  Positive results do  not rule out bacterial infection or co-infection with other viruses. If result  is PRESUMPTIVE POSTIVE SARS-CoV-2 nucleic acids MAY BE PRESENT.   A presumptive positive result was obtained on the submitted specimen  and confirmed on repeat testing.  While 2019 novel coronavirus  (SARS-CoV-2) nucleic acids may be present in the submitted sample  additional confirmatory testing may be necessary for epidemiological  and / or clinical management purposes  to differentiate between  SARS-CoV-2 and other Sarbecovirus currently known to infect humans.  If clinically indicated additional testing with an alternate test  methodology [phone])  is advised. The SARS-CoV-2 RNA is generally  detectable in upper and lower respiratory sp ecimens during the acute  phase of infection. The expected result is Negative. Fact Sheet for Patients:  StrictlyIdeas.no Fact Sheet for Healthcare Providers: BankingDealers.co.za This test is not yet approved or cleared by the [place] FDA and has been authorized for detection and/or diagnosis of SARS-CoV-2 by FDA under an Emergency Use Authorization (EUA).  This EUA will remain in effect (meaning this test can be used) for the duration of the COVID-19 declaration under Section 564(b)(1) of the Act, 21 U.S.C. section 360bbb-3(b)(1), unless the authorization is terminated or revoked sooner. Performed at [facility], [place] [address] [place], [place] [address]   Urine Culture     Status: Abnormal   Collection Time: 01/16/19  1:37 AM   Specimen: Urine, Random  Result Value Ref Range Status   Specimen Description URINE, RANDOM  Final   Special Requests NONE  Final   Culture (A)  Final    <10,000 COLONIES/mL INSIGNIFICANT GROWTH Performed at [facility], [place] [address], [place], [place] [address]    Report Status 01/17/2019 FINAL  Final  Culture, blood (Routine X 2) w Reflex to ID Panel     Status: None   Collection Time: 01/16/19  3:10 AM   Specimen: BLOOD LEFT ARM  Result Value Ref Range Status   Specimen Description BLOOD LEFT ARM  Final   Special Requests   Final    BOTTLES DRAWN AEROBIC AND ANAEROBIC Blood Culture adequate volume   Culture   Final    NO GROWTH 5 DAYS Performed at [facility], [place] [address] [place], [place] [address]    Report Status 01/21/2019 FINAL  Final  Culture, blood (Routine X 2) w Reflex to ID Panel     Status: None   Collection Time: 01/16/19  3:13 AM   Specimen: BLOOD RIGHT FOREARM  Result Value Ref Range Status   Specimen Description BLOOD RIGHT FOREARM  Final   Special Requests    Final    BOTTLES DRAWN AEROBIC AND ANAEROBIC Blood Culture adequate volume   Culture   Final    NO GROWTH 5 DAYS Performed at [facility], [place] [address] [place], [place] [address]    Report Status 01/21/2019 FINAL  Final  MRSA PCR Screening     Status: None   Collection Time: 01/16/19  4:25 AM   Specimen: Nasal Mucosa; Nasopharyngeal  Result Value Ref Range Status   MRSA by PCR NEGATIVE NEGATIVE Final    Comment:        The GeneXpert MRSA Assay (FDA approved for NASAL specimens only), is one component of a comprehensive MRSA colonization surveillance program. It is not intended to diagnose MRSA infection nor to guide or monitor treatment for MRSA infections. Performed at [facility], [place] [address], [place], [place] [address]   Respiratory Panel by PCR     Status: None  Collection Time: 01/16/19  4:38 AM   Specimen: Nasopharyngeal Swab; Respiratory  Result Value Ref Range Status   Adenovirus NOT DETECTED NOT DETECTED Final   Coronavirus 229E NOT DETECTED NOT DETECTED Final    Comment: (NOTE) The Coronavirus on the Respiratory Panel, DOES NOT test for the novel  Coronavirus (2019 nCoV)    Coronavirus HKU1 NOT DETECTED NOT DETECTED Final   Coronavirus NL63 NOT DETECTED NOT DETECTED Final   Coronavirus OC43 NOT DETECTED NOT DETECTED Final   Metapneumovirus NOT DETECTED NOT DETECTED Final   Rhinovirus / Enterovirus NOT DETECTED NOT DETECTED Final   Influenza A NOT DETECTED NOT DETECTED Final   Influenza B NOT DETECTED NOT DETECTED Final   Parainfluenza Virus 1 NOT DETECTED NOT DETECTED Final   Parainfluenza Virus 2 NOT DETECTED NOT DETECTED Final   Parainfluenza Virus 3 NOT DETECTED NOT DETECTED Final   Parainfluenza Virus 4 NOT DETECTED NOT DETECTED Final   Respiratory Syncytial Virus NOT DETECTED NOT DETECTED Final   Bordetella pertussis NOT DETECTED NOT DETECTED Final   Chlamydophila pneumoniae NOT DETECTED NOT DETECTED Final   Mycoplasma pneumoniae NOT  DETECTED NOT DETECTED Final    Comment: Performed at [facility], [place]. [address], [place], [place] [address]     Labs: Basic Metabolic Panel: Recent Labs  Lab 01/18/19 0241 01/19/19 0804 01/20/19 0245 01/21/19 0233 01/22/19 0839  NA 132* 132* 130* 129* 134*  K 4.4 5.1 5.4* 5.1 4.8  CL 92* 90* 87* 86* 92*  CO2 31 30 34* 33* 32  GLUCOSE 245* 243* 196* 111* 212*  BUN 16 23 33* 45* 36*  CREATININE 0.72 0.98 0.98 1.26* 0.95  CALCIUM 8.6* 8.6* 8.6* 8.3* 8.7*  MG 2.0 1.9 2.0 2.1 2.3   Liver Function Tests: Recent Labs  Lab 01/15/19 2238 01/20/19 0245  AST 9* 15  ALT 11 19  ALKPHOS 54 45  BILITOT 0.8 0.7  PROT 7.4 6.9  ALBUMIN 3.2* 2.9*   No results for input(s): LIPASE, AMYLASE in the last 168 hours. No results for input(s): AMMONIA in the last 168 hours. CBC: Recent Labs  Lab 01/18/19 0241 01/19/19 0804 01/20/19 0245 01/21/19 0233 01/22/19 0839  WBC 6.9 10.9* 11.0* 13.7* 9.7  HGB 12.1* 12.4* 12.2* 11.7* 12.0*  HCT 37.3* 37.9* 37.3* 36.7* 37.8*  MCV 93.3 93.1 92.1 93.6 94.7  PLT 138* 155 160 160 148*   Cardiac Enzymes: Recent Labs  Lab 01/15/19 2238 01/16/19 0313 01/16/19 0715 01/16/19 1245  TROPONINI 0.05* 0.04* 0.04* 0.04*   BNP: BNP (last 3 results) Recent Labs    01/15/19 1517 01/19/19 1047  BNP 176.0* 155.6*    ProBNP (last 3 results) No results for input(s): PROBNP in the last 8760 hours.  CBG: Recent Labs  Lab 01/21/19 2047 01/22/19 0019 01/22/19 0350 01/22/19 0800 01/22/19 1104  GLUCAP 245* 169* 90 166* 172*       Signed:  Dia Crawford, MD [facility] [phone] pager

## 2019-01-22 NOTE — Progress Notes (Signed)
Pt discharged home via [facility] with his belongings and discharge instructions. Pt family aware of family being discharged and is awaiting his arrival at home. Oxygen already delivered to pts residence and he has been instructed on how and what amount to use.

## 2019-01-22 NOTE — Progress Notes (Signed)
SATURATION QUALIFICATIONS: (This note is used to comply with regulatory documentation for home oxygen)  Patient Saturations on Room Air at Rest = 89%  Patient Saturations on Room Air while Ambulating = 87%  Patient Saturations on 2 Liters of oxygen while Ambulating =88-92%  Please briefly explain why patient needs home oxygen: pt requires o2  inhalation

## 2019-01-22 NOTE — Progress Notes (Signed)
Discharge instructions given to pt.

## 2019-01-22 NOTE — Progress Notes (Signed)
As per instruction from case mgt , to make arrangement with [facility] ambulance  once  o2 tank is delivered  at home. Spoke with sara pt's companion who claimed that [facility]2 at around 8pm.

## 2019-01-22 NOTE — TOC Transition Note (Signed)
Transition of Care [facility]) - CM/SW Discharge Note   Patient Details  Name: Nathan Cunningham MRN: [id number] Date of Birth: 02-07-1925  Transition of Care [facility]) CM/SW Contact:  Maryclare Labrador, RN Phone Number: 01/22/2019, 3:34 PM   Clinical Narrative:   Pt to discharge home today in care of his partner [name].  Per attending pt refused SNF on day of discharge - pt will be discharged home with [facility].  CM informed Alvis Lemmings that pt is ready for discharge home today and of [place] modalities now required.  Pt will also need home oxygen - medicare.gov hH list given verbally and pt chose [employer] - agency contacted and referral accepted.  Pt now requesting [facility] transport home - CSW to arrange.  Bedside nurse aware that [facility] should not be arranged until oxygen equipment is confirmed in the home.  NO other CM needs determined at this time - CM signing off     Final next level of care: Home w Home Health Services Barriers to Discharge: Barriers Resolved   Patient Goals and CMS Choice Patient states their goals for this hospitalization and ongoing recovery are:: Pt is eager to get back home and resume life with his partner CMS Medicare.gov Compare Post Acute Care list provided to:: Patient Choice offered to / list presented to : Patient  Discharge Placement                       Discharge Plan and Services                DME Arranged: Oxygen DME Agency: [facility] Date DME Agency Contacted: 01/22/19 Time DME Agency Contacted: [phone] Representative spoke with at DME Agency: zack HH Arranged: RN, PT, OT, Nurse's Aide, Social Work, Refused SNF [place] Agency: [place] Date [place]: 01/22/19 Time [place]: 1533 Representative spoke with at [place]: cory  Social Determinants of Health ([place]) Interventions     Readmission Risk Interventions No flowsheet data found.

## 2019-01-23 ENCOUNTER — Telehealth: Payer: Self-pay | Admitting: Behavioral Health

## 2019-01-23 NOTE — Care Management (Signed)
Post discharge orders being written - order written for wheelchair for the home.  Pt again wanted to use [employer] - agency accepted referral and will delivery dme directly to the home.

## 2019-01-23 NOTE — Telephone Encounter (Signed)
Dr. [name] - Nathan Cunningham, the patient reported during the TCM/Hospital Follow-up call that last night (01/22/19) when he returned home from the hospital, he slid out of his reclining chair. Per the patient, he does not state the incident as a fall. RN inquired about injuries (e.g. head, back, limbs, etc.), bleeding, pain; the patient states "I do not have any of those things, no injuries, no pain". Also, RN advised for the patient to be evaluated and offered an appointment, but he declined. Patient voiced that once he speaks with his significant other [name]) they will give the office a call back with a date & time to complete a virtual visit.

## 2019-01-23 NOTE — Telephone Encounter (Signed)
Transition Care Management Follow-up Telephone Call  PCP: Lucille Passy, MD  Admit date: 01/15/2019 Discharge date: 01/22/2019   Recommendations for Outpatient Follow-up:   Acute hypoxemic respiratory failure/COPD exacerbation -Completed 7-day course antibiotics -Mucinex DM twice daily PRN -Flutter valve -Titrate O2 to maintain SPO2 89 to 93% -Ambulatory SPO2 on 6/15 SATURATION QUALIFICATIONS: (Thisnote is usedto comply with regulatory documentation for home oxygen) Patient Saturations on Room Air at Rest =89% Patient Saturations on [employer] while Ambulating =87% Patient Saturations on2Liters of oxygen while Ambulating =88-92% Please briefly explain why patient needs home oxygen:pt requires o2 inhalation             -2 L O2 via [place].  Titrate to maintain SPO2 89 to 93%  -Recommended a stay at SNF for rehabilitation but patient stated he has a wife at home to help him perform exercises and refused.   How have you been since you were released from the hospital? Patient stated, "I'm doing pretty good".   Do you understand why you were in the hospital? yes   Do you understand the discharge instructions? no, pt. voiced that there are some things that's still not clear and would like to speak with Dr. [name] further about.   Where were you discharged to? Home with significant other.   Items Reviewed:  Medications reviewed: no, pt. would like to review medications with PCP.  Allergies reviewed: yes, NKA  Dietary changes reviewed: yes, pt. reported that he's consuming a regular diet.  Referrals reviewed: yes, follow-up with PCP & Urology in 1 week.   Functional Questionnaire:   Activities of Daily Living (ADLs):   He states they are independent in the following: feeding States they require assistance with the following: ambulation, bathing and hygiene, continence, grooming, toileting and dressing   Any transportation issues/concerns?: no   Any patient concerns?  yes, pt. voiced that he has questions for his PCP that he will gather prior to the virtual visit.   Confirmed importance and date/time of follow-up visits scheduled no, patient stated that he will call the office when he's ready to schedule virtual visit with Dr. [name].   Confirmed with patient if condition begins to worsen call PCP or go to the ER.  Patient was given the office number and encouraged to call back with question or concerns.  : yes

## 2019-01-24 DIAGNOSIS — J439 Emphysema, unspecified: Secondary | ICD-10-CM | POA: Diagnosis not present

## 2019-01-24 DIAGNOSIS — I4892 Unspecified atrial flutter: Secondary | ICD-10-CM | POA: Diagnosis not present

## 2019-01-24 DIAGNOSIS — J9601 Acute respiratory failure with hypoxia: Secondary | ICD-10-CM | POA: Diagnosis not present

## 2019-01-24 DIAGNOSIS — H548 Legal blindness, as defined in USA: Secondary | ICD-10-CM | POA: Diagnosis not present

## 2019-01-24 DIAGNOSIS — N390 Urinary tract infection, site not specified: Secondary | ICD-10-CM | POA: Diagnosis not present

## 2019-01-24 DIAGNOSIS — I5033 Acute on chronic diastolic (congestive) heart failure: Secondary | ICD-10-CM | POA: Diagnosis not present

## 2019-01-24 DIAGNOSIS — E1165 Type 2 diabetes mellitus with hyperglycemia: Secondary | ICD-10-CM | POA: Diagnosis not present

## 2019-01-24 DIAGNOSIS — J219 Acute bronchiolitis, unspecified: Secondary | ICD-10-CM | POA: Diagnosis not present

## 2019-01-24 DIAGNOSIS — I959 Hypotension, unspecified: Secondary | ICD-10-CM | POA: Diagnosis not present

## 2019-01-24 DIAGNOSIS — I110 Hypertensive heart disease with heart failure: Secondary | ICD-10-CM | POA: Diagnosis not present

## 2019-01-25 ENCOUNTER — Encounter (HOSPITAL_COMMUNITY): Payer: Self-pay

## 2019-01-25 ENCOUNTER — Other Ambulatory Visit: Payer: Self-pay

## 2019-01-25 ENCOUNTER — Emergency Department (HOSPITAL_COMMUNITY): Payer: Medicare Other

## 2019-01-25 ENCOUNTER — Inpatient Hospital Stay (HOSPITAL_COMMUNITY)
Admission: EM | Admit: 2019-01-25 | Discharge: 2019-01-30 | DRG: 315 | Disposition: A | Discharge status: Skilled Nursing Facility | Payer: Medicare Other | Attending: Internal Medicine | Admitting: Internal Medicine

## 2019-01-25 DIAGNOSIS — G4733 Obstructive sleep apnea (adult) (pediatric): Secondary | ICD-10-CM | POA: Diagnosis present

## 2019-01-25 DIAGNOSIS — J441 Chronic obstructive pulmonary disease with (acute) exacerbation: Secondary | ICD-10-CM | POA: Diagnosis present

## 2019-01-25 DIAGNOSIS — Z1159 Encounter for screening for other viral diseases: Secondary | ICD-10-CM | POA: Diagnosis not present

## 2019-01-25 DIAGNOSIS — E871 Hypo-osmolality and hyponatremia: Secondary | ICD-10-CM | POA: Diagnosis not present

## 2019-01-25 DIAGNOSIS — J9611 Chronic respiratory failure with hypoxia: Secondary | ICD-10-CM | POA: Diagnosis not present

## 2019-01-25 DIAGNOSIS — R319 Hematuria, unspecified: Secondary | ICD-10-CM | POA: Diagnosis not present

## 2019-01-25 DIAGNOSIS — I959 Hypotension, unspecified: Secondary | ICD-10-CM | POA: Diagnosis present

## 2019-01-25 DIAGNOSIS — Z7982 Long term (current) use of aspirin: Secondary | ICD-10-CM

## 2019-01-25 DIAGNOSIS — I5032 Chronic diastolic (congestive) heart failure: Secondary | ICD-10-CM | POA: Diagnosis present

## 2019-01-25 DIAGNOSIS — J9601 Acute respiratory failure with hypoxia: Secondary | ICD-10-CM

## 2019-01-25 DIAGNOSIS — Z8249 Family history of ischemic heart disease and other diseases of the circulatory system: Secondary | ICD-10-CM | POA: Diagnosis not present

## 2019-01-25 DIAGNOSIS — E875 Hyperkalemia: Secondary | ICD-10-CM | POA: Diagnosis not present

## 2019-01-25 DIAGNOSIS — R0902 Hypoxemia: Secondary | ICD-10-CM | POA: Diagnosis not present

## 2019-01-25 DIAGNOSIS — Z9981 Dependence on supplemental oxygen: Secondary | ICD-10-CM

## 2019-01-25 DIAGNOSIS — Z7401 Bed confinement status: Secondary | ICD-10-CM | POA: Diagnosis not present

## 2019-01-25 DIAGNOSIS — Z79899 Other long term (current) drug therapy: Secondary | ICD-10-CM

## 2019-01-25 DIAGNOSIS — E669 Obesity, unspecified: Secondary | ICD-10-CM | POA: Diagnosis present

## 2019-01-25 DIAGNOSIS — I952 Hypotension due to drugs: Secondary | ICD-10-CM | POA: Diagnosis not present

## 2019-01-25 DIAGNOSIS — M2550 Pain in unspecified joint: Secondary | ICD-10-CM | POA: Diagnosis not present

## 2019-01-25 DIAGNOSIS — T50905A Adverse effect of unspecified drugs, medicaments and biological substances, initial encounter: Secondary | ICD-10-CM | POA: Diagnosis not present

## 2019-01-25 DIAGNOSIS — Z6830 Body mass index (BMI) 30.0-30.9, adult: Secondary | ICD-10-CM

## 2019-01-25 DIAGNOSIS — I509 Heart failure, unspecified: Secondary | ICD-10-CM

## 2019-01-25 DIAGNOSIS — R600 Localized edema: Secondary | ICD-10-CM | POA: Diagnosis not present

## 2019-01-25 DIAGNOSIS — Z823 Family history of stroke: Secondary | ICD-10-CM

## 2019-01-25 DIAGNOSIS — E785 Hyperlipidemia, unspecified: Secondary | ICD-10-CM | POA: Diagnosis present

## 2019-01-25 DIAGNOSIS — R531 Weakness: Secondary | ICD-10-CM

## 2019-01-25 DIAGNOSIS — I4892 Unspecified atrial flutter: Secondary | ICD-10-CM | POA: Diagnosis present

## 2019-01-25 DIAGNOSIS — R310 Gross hematuria: Secondary | ICD-10-CM | POA: Diagnosis not present

## 2019-01-25 DIAGNOSIS — E1140 Type 2 diabetes mellitus with diabetic neuropathy, unspecified: Secondary | ICD-10-CM | POA: Diagnosis not present

## 2019-01-25 DIAGNOSIS — E860 Dehydration: Secondary | ICD-10-CM | POA: Diagnosis present

## 2019-01-25 DIAGNOSIS — N179 Acute kidney failure, unspecified: Secondary | ICD-10-CM | POA: Diagnosis present

## 2019-01-25 DIAGNOSIS — R000 Tachycardia, unspecified: Secondary | ICD-10-CM | POA: Diagnosis not present

## 2019-01-25 DIAGNOSIS — R52 Pain, unspecified: Secondary | ICD-10-CM | POA: Diagnosis not present

## 2019-01-25 DIAGNOSIS — I499 Cardiac arrhythmia, unspecified: Secondary | ICD-10-CM | POA: Diagnosis not present

## 2019-01-25 DIAGNOSIS — N400 Enlarged prostate without lower urinary tract symptoms: Secondary | ICD-10-CM | POA: Diagnosis not present

## 2019-01-25 DIAGNOSIS — I517 Cardiomegaly: Secondary | ICD-10-CM | POA: Diagnosis not present

## 2019-01-25 DIAGNOSIS — I110 Hypertensive heart disease with heart failure: Secondary | ICD-10-CM | POA: Diagnosis not present

## 2019-01-25 DIAGNOSIS — J9600 Acute respiratory failure, unspecified whether with hypoxia or hypercapnia: Secondary | ICD-10-CM | POA: Diagnosis not present

## 2019-01-25 LAB — URINALYSIS, ROUTINE W REFLEX MICROSCOPIC
Bilirubin Urine: NEGATIVE
Glucose, UA: NEGATIVE mg/dL
Ketones, ur: NEGATIVE mg/dL
Nitrite: NEGATIVE
Protein, ur: NEGATIVE mg/dL
RBC / HPF: >50 RBC/hpf — ABNORMAL HIGH (ref 0–5)
Specific Gravity, Urine: 1.015 (ref 1.005–1.030)
pH: 5 (ref 5.0–8.0)

## 2019-01-25 LAB — HEPATIC FUNCTION PANEL
ALT: 17 U/L (ref 0–44)
AST: 10 U/L — ABNORMAL LOW (ref 15–41)
Albumin: 3.1 g/dL — ABNORMAL LOW (ref 3.5–5.0)
Alkaline Phosphatase: 37 U/L — ABNORMAL LOW (ref 38–126)
Bilirubin, Direct: 0.1 mg/dL (ref 0.0–0.2)
Indirect Bilirubin: 0.8 mg/dL (ref 0.3–0.9)
Total Bilirubin: 0.9 mg/dL (ref 0.3–1.2)
Total Protein: 6 g/dL — ABNORMAL LOW (ref 6.5–8.1)

## 2019-01-25 LAB — BASIC METABOLIC PANEL
Anion gap: 9 (ref 5–15)
BUN: 52 mg/dL — ABNORMAL HIGH (ref 8–23)
CO2: 29 mmol/L (ref 22–32)
Calcium: 8.7 mg/dL — ABNORMAL LOW (ref 8.9–10.3)
Chloride: 90 mmol/L — ABNORMAL LOW (ref 98–111)
Creatinine, Ser: 1.54 mg/dL — ABNORMAL HIGH (ref 0.61–1.24)
GFR calc Af Amer: 45 mL/min — ABNORMAL LOW (ref >60)
GFR calc non Af Amer: 39 mL/min — ABNORMAL LOW (ref >60)
Glucose, Bld: 119 mg/dL — ABNORMAL HIGH (ref 70–99)
Potassium: 5 mmol/L (ref 3.5–5.1)
Sodium: 128 mmol/L — ABNORMAL LOW (ref 135–145)

## 2019-01-25 LAB — CBC WITH DIFFERENTIAL/PLATELET
Abs Immature Granulocytes: 0 10*3/uL (ref 0.00–0.07)
Basophils Absolute: 0 10*3/uL (ref 0.0–0.1)
Basophils Relative: 0 %
Eosinophils Absolute: 0 10*3/uL (ref 0.0–0.5)
Eosinophils Relative: 0 %
HCT: 34.9 % — ABNORMAL LOW (ref 39.0–52.0)
Hemoglobin: 11.1 g/dL — ABNORMAL LOW (ref 13.0–17.0)
Lymphocytes Relative: 7 %
Lymphs Abs: 1 10*3/uL (ref 0.7–4.0)
MCH: 30 pg (ref 26.0–34.0)
MCHC: 31.8 g/dL (ref 30.0–36.0)
MCV: 94.3 fL (ref 80.0–100.0)
Monocytes Absolute: 2.7 10*3/uL — ABNORMAL HIGH (ref 0.1–1.0)
Monocytes Relative: 19 %
Neutro Abs: 10.7 10*3/uL — ABNORMAL HIGH (ref 1.7–7.7)
Neutrophils Relative %: 74 %
Platelets: 135 10*3/uL — ABNORMAL LOW (ref 150–400)
RBC: 3.7 MIL/uL — ABNORMAL LOW (ref 4.22–5.81)
RDW: 14.2 % (ref 11.5–15.5)
WBC: 14.4 10*3/uL — ABNORMAL HIGH (ref 4.0–10.5)
nRBC: 0 % (ref 0.0–0.2)
nRBC: 0 /100 WBC

## 2019-01-25 LAB — BRAIN NATRIURETIC PEPTIDE: B Natriuretic Peptide: 46.3 pg/mL (ref 0.0–100.0)

## 2019-01-25 LAB — OSMOLALITY, URINE: Osmolality, Ur: 328 mOsm/kg (ref 300–900)

## 2019-01-25 LAB — CREATININE, SERUM
Creatinine, Ser: 1.36 mg/dL — ABNORMAL HIGH (ref 0.61–1.24)
GFR calc Af Amer: 52 mL/min — ABNORMAL LOW (ref >60)
GFR calc non Af Amer: 45 mL/min — ABNORMAL LOW (ref >60)

## 2019-01-25 LAB — HEMOGLOBIN A1C
Hgb A1c MFr Bld: 7.9 % — ABNORMAL HIGH (ref 4.8–5.6)
Mean Plasma Glucose: 180.03 mg/dL

## 2019-01-25 LAB — GLUCOSE, CAPILLARY: Glucose-Capillary: 197 mg/dL — ABNORMAL HIGH (ref 70–99)

## 2019-01-25 LAB — TROPONIN I: Troponin I: <0.03 ng/mL (ref <0.03)

## 2019-01-25 LAB — SODIUM, URINE, RANDOM: Sodium, Ur: 23 mmol/L

## 2019-01-25 LAB — CBC
HCT: 35 % — ABNORMAL LOW (ref 39.0–52.0)
Hemoglobin: 11.4 g/dL — ABNORMAL LOW (ref 13.0–17.0)
MCH: 30.5 pg (ref 26.0–34.0)
MCHC: 32.6 g/dL (ref 30.0–36.0)
MCV: 93.6 fL (ref 80.0–100.0)
Platelets: 134 10*3/uL — ABNORMAL LOW (ref 150–400)
RBC: 3.74 MIL/uL — ABNORMAL LOW (ref 4.22–5.81)
RDW: 13.9 % (ref 11.5–15.5)
WBC: 13.4 10*3/uL — ABNORMAL HIGH (ref 4.0–10.5)
nRBC: 0 % (ref 0.0–0.2)

## 2019-01-25 LAB — MAGNESIUM: Magnesium: 2.3 mg/dL (ref 1.7–2.4)

## 2019-01-25 LAB — TSH: TSH: 4.061 u[IU]/mL (ref 0.350–4.500)

## 2019-01-25 LAB — OSMOLALITY: Osmolality: 296 mOsm/kg — ABNORMAL HIGH (ref 275–295)

## 2019-01-25 MED ORDER — ONDANSETRON HCL 4 MG PO TABS: 4.0000 mg | ORAL_TABLET | Freq: Four times a day (QID) | ORAL | Status: DC | PRN

## 2019-01-25 MED ORDER — VITAMIN B-1 100 MG PO TABS
100.0000 mg | ORAL_TABLET | Freq: Every day | ORAL | Status: DC
  Administered 2019-01-26 – 2019-01-30 (×5): 100 mg via ORAL
  Filled 2019-01-25 (×5): qty 1

## 2019-01-25 MED ORDER — TIMOLOL MALEATE 0.5 % OP SOLN
1.0000 [drp] | Freq: Two times a day (BID) | OPHTHALMIC | Status: DC
  Filled 2019-01-25: qty 5

## 2019-01-25 MED ORDER — INSULIN ASPART 100 UNIT/ML ~~LOC~~ SOLN
0.0000 [IU] | Freq: Every day | SUBCUTANEOUS | Status: DC
  Administered 2019-01-27: 2 [IU] via SUBCUTANEOUS

## 2019-01-25 MED ORDER — THIAMINE HCL 100 MG PO TABS: 100.0000 mg | ORAL_TABLET | Freq: Every day | ORAL | Status: DC

## 2019-01-25 MED ORDER — IPRATROPIUM BROMIDE 0.02 % IN SOLN: 0.5000 mg | Freq: Four times a day (QID) | RESPIRATORY_TRACT | Status: DC

## 2019-01-25 MED ORDER — BRIMONIDINE TARTRATE 0.2 % OP SOLN
1.0000 [drp] | Freq: Two times a day (BID) | OPHTHALMIC | Status: DC
  Administered 2019-01-25 – 2019-01-30 (×10): 1 [drp] via OPHTHALMIC
  Filled 2019-01-25: qty 5

## 2019-01-25 MED ORDER — FOLIC ACID 1 MG PO TABS
1.0000 mg | ORAL_TABLET | Freq: Every day | ORAL | Status: DC
  Administered 2019-01-26 – 2019-01-30 (×5): 1 mg via ORAL
  Filled 2019-01-25 (×5): qty 1

## 2019-01-25 MED ORDER — ONDANSETRON HCL 4 MG/2ML IJ SOLN: 4.0000 mg | Freq: Four times a day (QID) | INTRAMUSCULAR | Status: DC | PRN

## 2019-01-25 MED ORDER — INSULIN ASPART 100 UNIT/ML ~~LOC~~ SOLN
0.0000 [IU] | Freq: Three times a day (TID) | SUBCUTANEOUS | Status: DC
  Administered 2019-01-26 (×2): 2 [IU] via SUBCUTANEOUS
  Administered 2019-01-26: 1 [IU] via SUBCUTANEOUS
  Administered 2019-01-27 (×2): 2 [IU] via SUBCUTANEOUS
  Administered 2019-01-27: 1 [IU] via SUBCUTANEOUS
  Administered 2019-01-28 (×2): 2 [IU] via SUBCUTANEOUS
  Administered 2019-01-28: 1 [IU] via SUBCUTANEOUS
  Administered 2019-01-29: 2 [IU] via SUBCUTANEOUS
  Administered 2019-01-29: 1 [IU] via SUBCUTANEOUS
  Administered 2019-01-29: 2 [IU] via SUBCUTANEOUS
  Administered 2019-01-30: 3 [IU] via SUBCUTANEOUS

## 2019-01-25 MED ORDER — MULTI-VITAMIN PO TABS: 1.0000 | ORAL_TABLET | Freq: Every day | ORAL | Status: DC

## 2019-01-25 MED ORDER — BRIMONIDINE TARTRATE-TIMOLOL 0.2-0.5 % OP SOLN
1.0000 [drp] | Freq: Two times a day (BID) | OPHTHALMIC | Status: DC
  Filled 2019-01-25: qty 5

## 2019-01-25 MED ORDER — HEPARIN SODIUM (PORCINE) 5000 UNIT/ML IJ SOLN
5000.0000 [IU] | Freq: Three times a day (TID) | INTRAMUSCULAR | Status: DC
  Administered 2019-01-25 – 2019-01-26 (×2): 5000 [IU] via SUBCUTANEOUS
  Filled 2019-01-25 (×2): qty 1

## 2019-01-25 MED ORDER — ASPIRIN 325 MG PO TABS
162.5000 mg | ORAL_TABLET | Freq: Every day | ORAL | Status: DC
  Administered 2019-01-26 – 2019-01-30 (×5): 162.5 mg via ORAL
  Filled 2019-01-25 (×5): qty 1

## 2019-01-25 MED ORDER — ACETAMINOPHEN 325 MG PO TABS
650.0000 mg | ORAL_TABLET | Freq: Four times a day (QID) | ORAL | Status: DC | PRN
  Administered 2019-01-26: 650 mg via ORAL
  Filled 2019-01-25: qty 2

## 2019-01-25 MED ORDER — ADULT MULTIVITAMIN W/MINERALS CH
1.0000 | ORAL_TABLET | Freq: Every day | ORAL | Status: DC
  Administered 2019-01-26 – 2019-01-30 (×5): 1 via ORAL
  Filled 2019-01-25 (×5): qty 1

## 2019-01-25 MED ORDER — SODIUM CHLORIDE 0.9 % IV SOLN
INTRAVENOUS | Status: DC
  Administered 2019-01-25: 21:00:00 via INTRAVENOUS

## 2019-01-25 MED ORDER — PREGABALIN 75 MG PO CAPS
150.0000 mg | ORAL_CAPSULE | Freq: Every evening | ORAL | Status: DC
  Administered 2019-01-26 – 2019-01-29 (×4): 150 mg via ORAL
  Filled 2019-01-25 (×4): qty 2

## 2019-01-25 MED ORDER — TRAZODONE HCL 50 MG PO TABS
50.0000 mg | ORAL_TABLET | Freq: Once | ORAL | Status: AC
  Administered 2019-01-26: 50 mg via ORAL
  Filled 2019-01-25 (×2): qty 1

## 2019-01-25 MED ORDER — IPRATROPIUM-ALBUTEROL 0.5-2.5 (3) MG/3ML IN SOLN
3.0000 mL | Freq: Four times a day (QID) | RESPIRATORY_TRACT | Status: DC | PRN
  Filled 2019-01-25: qty 3

## 2019-01-25 MED ORDER — ACETAMINOPHEN 650 MG RE SUPP: 650.0000 mg | Freq: Four times a day (QID) | RECTAL | Status: DC | PRN

## 2019-01-25 NOTE — ED Notes (Signed)
Condom cath applied. X-ray in room at this time.

## 2019-01-25 NOTE — ED Triage Notes (Signed)
Pt from home with ems with c.o generalized weakness and hypotension. OT was at pts home, pt recently d.c from here on 6/15 for resp failure. Pt now on 2L [place] at home, hx of CHF with pitting edema in bil extremities. Pt denies any pain or shortness of breath, just gen weakness. Pt unable to walk since coming home from hospital. Pt a.o

## 2019-01-25 NOTE — ED Notes (Signed)
ED TO INPATIENT HANDOFF REPORT  ED Nurse Name and Phone #: Jolene Provost  S Name/Age/Gender Nathan Cunningham 85 y.o. male Room/Bed: 019C/019C  Code Status   Code Status: Prior  Home/SNF/Other Home Patient oriented to: situation Is this baseline? No   Triage Complete: Triage complete  Chief Complaint Weakness; Hypotensive  Triage Note Pt from home with ems with c.o generalized weakness and hypotension. OT was at pts home, pt recently d.c from here on 6/15 for resp failure. Pt now on 2L [place] at home, hx of CHF with pitting edema in bil extremities. Pt denies any pain or shortness of breath, just gen weakness. Pt unable to walk since coming home from hospital. Pt a.o   Allergies No Known Allergies  Level of Care/Admitting Diagnosis ED Disposition    None      B Medical/Surgery History Past Medical History:  Diagnosis Date  . Adenomatous polyp of colon 03/2003  . Anemia   . Constipation   . Diverticulosis   . Dizziness   . ETOH abuse    binge drinking  . Hemorrhoids   . Hyperlipidemia   . Hypertension   . Macular degeneration    Right eye  . OSA on CPAP   . Type II diabetes mellitus ([place])    Past Surgical History:  Procedure Laterality Date  . ANTERIOR CERVICAL DECOMP/DISCECTOMY FUSION    . APPENDECTOMY  1935  . BACK SURGERY    . CATARACT EXTRACTION Left   . INGUINAL HERNIA REPAIR  1935  . TONSILLECTOMY       A IV Location/Drains/Wounds Patient Lines/Drains/Airways Status   Active Line/Drains/Airways    Name:   Placement date:   Placement time:   Site:   Days:   Peripheral IV 01/25/19 Right Forearm   01/25/19    1518    Forearm   less than 1   Peripheral IV 01/25/19 Left Arm   01/25/19    1520    Arm   less than 1   External Urinary Catheter   01/20/19    0400    -   5   Wound / Incision (Open or Dehisced) 03/03/16 Other (Comment) Coccyx Pt has 2 stage II pressure ulcers, one is 1.5 X 1 cm, the second is 1 cm in length. The surrounding area is  blanching.    03/03/16    1100    Coccyx   1058          Intake/Output Last 24 hours  Intake/Output Summary (Last 24 hours) at 01/25/2019 1706 Last data filed at 01/25/2019 1531 Gross per 24 hour  Intake 1000 ml  Output 0 ml  Net 1000 ml    Labs/Imaging Results for orders placed or performed during the hospital encounter of 01/25/19 (from the past 48 hour(s))  CBC with Differential     Status: Abnormal   Collection Time: 01/25/19  3:41 PM  Result Value Ref Range   WBC 14.4 (H) 4.0 - 10.5 K/uL   RBC 3.70 (L) 4.22 - 5.81 MIL/uL   Hemoglobin 11.1 (L) 13.0 - 17.0 g/dL   HCT 34.9 (L) 39.0 - 52.0 %   MCV 94.3 80.0 - 100.0 fL   MCH 30.0 26.0 - 34.0 pg   MCHC 31.8 30.0 - 36.0 g/dL   RDW 14.2 11.5 - 15.5 %   Platelets 135 (L) 150 - 400 K/uL    Comment: REPEATED TO VERIFY   nRBC 0.0 0.0 - 0.2 %   Neutrophils  Relative % 74 %   Neutro Abs 10.7 (H) 1.7 - 7.7 K/uL   Lymphocytes Relative 7 %   Lymphs Abs 1.0 0.7 - 4.0 K/uL   Monocytes Relative 19 %   Monocytes Absolute 2.7 (H) 0.1 - 1.0 K/uL   Eosinophils Relative 0 %   Eosinophils Absolute 0.0 0.0 - 0.5 K/uL   Basophils Relative 0 %   Basophils Absolute 0.0 0.0 - 0.1 K/uL   nRBC 0 0 /100 WBC   Abs Immature Granulocytes 0.00 0.00 - 0.07 K/uL    Comment: Performed at [facility] [address], [place], [place] [address]  Basic metabolic panel     Status: Abnormal   Collection Time: 01/25/19  3:41 PM  Result Value Ref Range   Sodium 128 (L) 135 - 145 mmol/L   Potassium 5.0 3.5 - 5.1 mmol/L   Chloride 90 (L) 98 - 111 mmol/L   CO2 29 22 - 32 mmol/L   Glucose, Bld 119 (H) 70 - 99 mg/dL   BUN 52 (H) 8 - 23 mg/dL   Creatinine, Ser 1.54 (H) 0.61 - 1.24 mg/dL   Calcium 8.7 (L) 8.9 - 10.3 mg/dL   GFR calc non Af Amer 39 (L) >60 mL/min   GFR calc Af Amer 45 (L) >60 mL/min   Anion gap 9 5 - 15    Comment: Performed at [facility] [address], [place], [place] [address]  Brain natriuretic peptide     Status: None    Collection Time: 01/25/19  3:41 PM  Result Value Ref Range   B Natriuretic Peptide 46.3 0.0 - 100.0 pg/mL    Comment: Performed at [facility] [address], [place], [place] [address]  Hepatic function panel     Status: Abnormal   Collection Time: 01/25/19  3:41 PM  Result Value Ref Range   Total Protein 6.0 (L) 6.5 - 8.1 g/dL   Albumin 3.1 (L) 3.5 - 5.0 g/dL   AST 10 (L) 15 - 41 U/L   ALT 17 0 - 44 U/L   Alkaline Phosphatase 37 (L) 38 - 126 U/L   Total Bilirubin 0.9 0.3 - 1.2 mg/dL   Bilirubin, Direct 0.1 0.0 - 0.2 mg/dL   Indirect Bilirubin 0.8 0.3 - 0.9 mg/dL    Comment: Performed at [facility] [address], [place], [place] [address]  Troponin I - ONCE - STAT     Status: None   Collection Time: 01/25/19  3:41 PM  Result Value Ref Range   Troponin I <0.03 <0.03 ng/mL    Comment: Performed at [facility], [place] [address] [place], [place] [address]  Magnesium     Status: None   Collection Time: 01/25/19  3:41 PM  Result Value Ref Range   Magnesium 2.3 1.7 - 2.4 mg/dL    Comment: Performed at [facility] [address], [place], [place] [address]  Urinalysis, Routine w reflex microscopic     Status: Abnormal   Collection Time: 01/25/19  4:33 PM  Result Value Ref Range   Color, Urine AMBER (A) YELLOW    Comment: BIOCHEMICALS MAY BE AFFECTED BY COLOR   APPearance CLOUDY (A) CLEAR   Specific Gravity, Urine 1.015 1.005 - 1.030   pH 5.0 5.0 - 8.0   Glucose, UA NEGATIVE NEGATIVE mg/dL   Hgb urine dipstick LARGE (A) NEGATIVE   Bilirubin Urine NEGATIVE NEGATIVE   Ketones, ur NEGATIVE  NEGATIVE mg/dL   Protein, ur NEGATIVE NEGATIVE mg/dL   Nitrite NEGATIVE NEGATIVE   Leukocytes,Ua MODERATE (A) NEGATIVE   RBC / HPF >50 (H) 0 - 5 RBC/hpf   WBC, UA 0-5 0 - 5 WBC/hpf   Bacteria, UA RARE (A) NONE SEEN   Squamous Epithelial / LPF 0-5 0 - 5   Mucus PRESENT    Hyaline Casts, UA PRESENT     Comment: Performed at [facility], [place] [address]  [place], [place] [address]   Dg Chest Portable 1 View  Result Date: 01/25/2019 CLINICAL DATA:  Hypotension, weakness, pitting edema. Recent hospitalization for respiratory failure. History of CHF, hypertension, diabetes. EXAM: PORTABLE CHEST 1 VIEW COMPARISON:  Chest x-rays dated 01/15/2019 and 04/20/2016. FINDINGS: Stable cardiomegaly. Coarse lung markings bilaterally suggesting some degree of chronic interstitial lung disease. No confluent opacity to suggest a superimposed consolidating pneumonia. Chronic blunting of the LEFT costophrenic angle. Probable chronic atelectasis/scarring at the RIGHT lung base. No evidence of pleural effusion or pneumothorax. Osseous structures about the chest are unremarkable. IMPRESSION: 1. No evidence of acute cardiopulmonary abnormality. No evidence of pneumonia or pulmonary edema. 2. Stable cardiomegaly. Probable chronic interstitial lung disease. Electronically Signed   By: Franki Cabot M.D.   On: 01/25/2019 16:20    Pending Labs Unresulted Labs (From admission, onward)    Start     Ordered   01/25/19 1638  Novel Coronavirus,NAA,(SEND-OUT TO REF LAB - TAT 24-48 hrs); Hosp Order  (Asymptomatic Patients Labs)  Once,   STAT    Question:  Rule Out  Answer:  Yes   01/25/19 1637          Vitals/Pain Today's Vitals   01/25/19 1541 01/25/19 1615 01/25/19 1630 01/25/19 1700  BP: (!) 95/54 (!) 104/52 (!) 106/54 (!) 112/59  Pulse: 60 (!) 54 63 72  Resp: 18 15 17 19   Temp:      TempSrc:      SpO2: 100% 100% 100% 100%  Weight:      Height:      PainSc:        Isolation Precautions No active isolations  Medications Medications - No data to display  Mobility walks with person assist High fall risk   Focused Assessments Cardiac Assessment Handoff:  Cardiac Rhythm: Sinus bradycardia Lab Results  Component Value Date   CKTOTAL 81 03/03/2016   TROPONINI <0.03 01/25/2019   No results found for: DDIMER Does the Patient currently have chest pain? No  ,  Neuro Assessment Handoff:  Swallow screen pass? Yes  Cardiac Rhythm: Sinus bradycardia       Neuro Assessment: Within Defined Limits Neuro Checks:      Last Documented NIHSS Modified Score:   Has TPA been given? No If patient is a Neuro Trauma and patient is going to OR before floor call report to [place] nurse: [phone] or [phone]  , Renal Assessment Handoff:  Hemodialysis Schedule:   Last Hemodialysis date and time: N/A   Restricted appendage: n/a   , Pulmonary Assessment Handoff:  Lung sounds: Bilateral Breath Sounds: Diminished O2 Device: Nasal Cannula O2 Flow Rate (L/min): 2 L/min      R Recommendations: See Admitting Provider Note  Report given to:   Additional Notes:  Call if you have any questions

## 2019-01-25 NOTE — ED Notes (Signed)
UA sent. Not enough for culture add-on.

## 2019-01-25 NOTE — H&P (Signed)
History and Physical        Hospital Admission Note Date: 01/25/2019  Patient name: Nathan Cunningham Medical record number: [id number] Date of birth: 05/06/1925 Age: 85 y.o. Gender: male  PCP: Lucille Passy, MD    Patient coming from: Home  I have reviewed all records in the [facility].    Chief Complaint:  My physical therapist told me I had low blood pressure  HPI: Patient is a 85 year old male with history of hypertension, hyperlipidemia, diabetes mellitus type 2, NIDDM was just discharged on 01/22/2019 after being treated for acute hypoxic respiratory failure secondary to COPD exacerbation, acute on chronic diastolic CHF (EF 60 to 29% )and UTI.  Patient reports that he has not been able to ambulate and push his walker since his discharge from the hospital due to weakness.  Today, patient's home health physical therapist, took his blood pressure and it was in 80s and called EMS.  On questioning, patient reported no dizziness or lightheadedness, chest pain or palpitations or worsening SOB.  Patient reported only generalized weakness.  Patient had slid out of his chair after coming home on 6/15, due to weakness but denied any dizziness, loss of consciousness or hitting his head.  He reports that he was laying on the floor until his family member helped him in the morning. At the time of my encounter, blood pressure 106/61, patient denied any acute cardiac symptoms.   ED work-up/course:  In ED, temp 98, respiratory rate 18, pulse 58, initial BP 95/54, O2 sats 100% on 2 L Na 128. Na 134 on 6/15 Cr 1.5 on admission, was 0.95 on 6/15 Trop <0.03, BNP 46.3 WBC 14.4, no left shift   Review of Systems: Positives marked in 'bold' Constitutional: Denies fever, chills, diaphoresis, poor appetite and fatigue.  HEENT: Denies photophobia, eye pain, redness, hearing loss, ear  pain, congestion, sore throat, rhinorrhea, sneezing, mouth sores, trouble swallowing, neck pain, neck stiffness and tinnitus.   Respiratory: Denies SOB, DOE, cough, chest tightness,  and wheezing.   Cardiovascular: Denies chest pain, palpitations.  Patient has 2+ pitting edema however states it is chronic and has not worsened since discharge. Gastrointestinal: Denies nausea, vomiting, abdominal pain, diarrhea, constipation, blood in stool and abdominal distention.  Genitourinary: Denies dysuria, urgency, frequency, hematuria, flank pain and difficulty urinating.  Musculoskeletal: Denies myalgias, back pain, joint swelling, arthralgias and gait problem.  Skin: Denies pallor, rash and wound.  Neurological: Denies dizziness, seizures, syncope, light-headedness, numbness and headaches. + gen weakness  Hematological: Denies adenopathy. Easy bruising, personal or family bleeding history  Psychiatric/Behavioral: Denies suicidal ideation, mood changes, confusion, nervousness, sleep disturbance and agitation  Past Medical History: Past Medical History:  Diagnosis Date  . Adenomatous polyp of colon 03/2003  . Anemia   . Constipation   . Diverticulosis   . Dizziness   . ETOH abuse    binge drinking  . Hemorrhoids   . Hyperlipidemia   . Hypertension   . Macular degeneration    Right eye  . OSA on CPAP   . Type II diabetes mellitus ([place])     Past Surgical History:  Procedure Laterality Date  . ANTERIOR CERVICAL  DECOMP/DISCECTOMY FUSION    . APPENDECTOMY  1935  . BACK SURGERY    . CATARACT EXTRACTION Left   . INGUINAL HERNIA REPAIR  1935  . TONSILLECTOMY      Medications: Prior to Admission medications   Medication Sig Start Date End Date Taking? Authorizing Provider  aspirin 325 MG tablet Take 162.5 mg by mouth daily.    Yes [provider]  brimonidine-timolol (COMBIGAN) 0.2-0.5 % ophthalmic solution Place 1 drop into both eyes every 12 (twelve) hours.   Yes [provider]  Chromium Picolinate (RA CHROMIUM PICOLINATE) 400 MCG TABS Take 400 mcg by mouth daily.    Yes [provider]  diphenhydramine-acetaminophen (TYLENOL PM) 25-500 MG TABS tablet Take 1 tablet by mouth at bedtime.    Yes [provider]  OXYGEN Inhale 2-3 L into the lungs continuous.    Yes [provider]  pregabalin (LYRICA) 75 MG capsule TAKE ONE CAPSULE BY MOUTH TWICE DAILY. GENERIC EQUIVALENT FOR LYRICA. Patient taking differently: Take 150 mg by mouth every evening.  12/21/18  Yes Lucille Passy, MD  telmisartan (MICARDIS) 40 MG tablet Take 40 mg by mouth daily.   Yes [provider]  cyanocobalamin (,VITAMIN B-12,) 1000 MCG/ML injection Inject 1040mcg every 60days IM (every other month) Patient taking differently: Inject 1,000 mcg into the muscle.  03/21/14   Lucille Passy, MD  dextromethorphan-guaiFENesin [facility] DM) 30-600 MG 12hr tablet Take 1 tablet by mouth 2 (two) times daily as needed for cough. 01/22/19   Allie Bossier, MD  folic acid (FOLVITE) 1 MG tablet Take 1 tablet (1 mg total) by mouth daily. 01/23/19   Allie Bossier, MD  glucose blood (ONE TOUCH ULTRA TEST) test strip CHECK BLOOD SUGAR ONCE DAILY AND AS DIRECTED. 07/20/16   Lucille Passy, MD  irbesartan (AVAPRO) 150 MG tablet Take 1 tablet (150 mg total) by mouth daily. 01/23/19   Allie Bossier, MD  JANUVIA 100 MG tablet TAKE 1 TABLET BY MOUTH DAILY Patient taking differently: Take 100 mg by mouth daily.  09/27/18   Lucille Passy, MD  KOMBIGLYZE XR 12-998 MG TB24 TAKE 1 TABLET BY MOUTH DAILY Patient taking differently: Take 1 tablet by mouth daily.  09/27/18   Lucille Passy, MD  Multiple Vitamin (MULTI-VITAMIN) tablet Take 1 tablet by mouth daily.     [provider]  Multiple Vitamins-Minerals (EYE-VITES) TABS Take 1 tablet by mouth 2 (two) times daily.      [provider]  thiamine 100 MG tablet Take 1 tablet (100 mg total) by mouth daily. 01/23/19   Allie Bossier, MD    Allergies:   Allergies  Allergen Reactions  . Tape Other (See Comments)    SKIN IS VERY THIN AND WILL TEAR AND BRUISE VERY EASILY!!    Social History:  reports that he has never smoked. He has never used smokeless tobacco. He reports current alcohol use of about 21.0 standard drinks of alcohol per week. He reports that he does not use drugs.  Family History: Family History  Problem Relation Age of Onset  . Heart disease Father   . Stroke Father 15    Physical Exam: Blood pressure (!) 112/59, pulse 72, temperature 98 F (36.7 C), temperature source Oral, resp. rate 19, height 5\' 7"  (1.702 m), weight 81.6 kg, SpO2 100 %. General: Alert, awake, oriented x3, in no acute distress. Eyes: pink conjunctiva,anicteric sclera, pupils equal and reactive to light and  accomodation, HEENT: normocephalic, atraumatic, oropharynx clear Neck: supple, no masses or lymphadenopathy, no goiter, no bruits, no JVD CVS: Regular rate and rhythm, without murmurs, rubs or gallops. 2+ lower extremity edema Resp : Clear to auscultation bilaterally, no wheezing, rales or rhonchi. GI : Soft, nontender, nondistended, positive bowel sounds, no masses. No hepatomegaly. No hernia.  Musculoskeletal: No clubbing or cyanosis, positive pedal pulses. No contracture. ROM intact  Neuro: Grossly intact, no focal neurological deficits, strength 5/5 upper and lower extremities bilaterally Psych: alert and oriented x 3, normal mood and affect Skin: no rashes or lesions, warm and dry   LABS on Admission: I have personally reviewed all the labs and imagings below    Basic Metabolic Panel: Recent Labs  Lab 01/22/19 0839 01/25/19 1541  NA 134* 128*  K 4.8 5.0  CL 92* 90*  CO2 32 29  GLUCOSE 212* 119*  BUN 36* 52*  CREATININE 0.95 1.54*  CALCIUM 8.7* 8.7*  MG 2.3 2.3   Liver Function Tests: Recent Labs  Lab 01/20/19 0245 01/25/19 1541  AST 15 10*  ALT 19 17  ALKPHOS 45 37*  BILITOT 0.7 0.9   PROT 6.9 6.0*  ALBUMIN 2.9* 3.1*   No results for input(s): LIPASE, AMYLASE in the last 168 hours. No results for input(s): AMMONIA in the last 168 hours. CBC: Recent Labs  Lab 01/22/19 0839 01/25/19 1541  WBC 9.7 14.4*  NEUTROABS  --  10.7*  HGB 12.0* 11.1*  HCT 37.8* 34.9*  MCV 94.7 94.3  PLT 148* 135*   Cardiac Enzymes: Recent Labs  Lab 01/25/19 1541  TROPONINI <0.03   BNP: Invalid input(s): POCBNP CBG: Recent Labs  Lab 01/22/19 1622 01/22/19 1954  GLUCAP 114* 247*    Radiological Exams on Admission:  Dg Chest Portable 1 View  Result Date: 01/25/2019 CLINICAL DATA:  Hypotension, weakness, pitting edema. Recent hospitalization for respiratory failure. History of CHF, hypertension, diabetes. EXAM: PORTABLE CHEST 1 VIEW COMPARISON:  Chest x-rays dated 01/15/2019 and 04/20/2016. FINDINGS: Stable cardiomegaly. Coarse lung markings bilaterally suggesting some degree of chronic interstitial lung disease. No confluent opacity to suggest a superimposed consolidating pneumonia. Chronic blunting of the LEFT costophrenic angle. Probable chronic atelectasis/scarring at the RIGHT lung base. No evidence of pleural effusion or pneumothorax. Osseous structures about the chest are unremarkable. IMPRESSION: 1. No evidence of acute cardiopulmonary abnormality. No evidence of pneumonia or pulmonary edema. 2. Stable cardiomegaly. Probable chronic interstitial lung disease. Electronically Signed   By: Franki Cabot M.D.   On: 01/25/2019 16:20      EKG: Independently reviewed. Rate 59, no acute ST-T wave changes    Assessment/Plan Principal Problem:   Hypotension with history of essential hypertension  -BP currently stable. During the previous admission, patient was noticed to be hypotensive and was attributed to overdiuresis and hypoalbuminemia -Hold ARB, check orthostatic vitals, gentle hydration for 1 L then reassess -Will place TED hoses which will also help with peripheral edema,  patient may benefit from midodrine if BP remains low   Active Problems: Hyponatremia  - BNP low, CXR clear, no pulmonary edema, peripheral edema likely due to chronic diastolic CHF and hypoalbuminemia  - will check serum and urine osmolarity, UNa, TSH  - Will hold ARB, gentle IVF x 1liter, then reassess   Acute kidney injury:  - Baseline Cr 0.95 - Likely due to poor oral intake, dehydration, ARB - hold micardis, gentle hydration and reassess  - Repeat UA, had recent UTI, currently no dysuria  Chronic diastolic CHF - BNP 99.8, has chronic peripheral edema however chest x-ray shows no acute cardiopulmonary abnormality, no pneumonia or pulmonary edema -2D echo done on 01/16/2019 showed EF 60-65%,impaired relaxation -Place TED hoses, will not be able to give IV or oral Lasix due to borderline BP. Once BP and Na stable, may be able to add low dose lasix at discharge.      Type 2 diabetes mellitus with diabetic neuropathy, unspecified (HCC) - Hold oral hypoglycemics, obtain HbA1c - place on sensitive SSI  Chronic respiratory failure with hypoxia (HCC) secondary to COPD - On O2 2L at home, currently no wheezing or worsening shortness of breath  Generalized weakness -During the previous admission, PT had recommended skilled nursing facility, patient had refused, currently has home health PT. Patient reports difficulty in ambulation due to weakness since discharge. No focal weakness.  -PT OT evaluation  DVT prophylaxis: heparin sq  CODE STATUS: full code   Consults called: none   Family Communication: Admission, patients condition and plan of care including tests being ordered have been discussed with the patient who indicates understanding and agree with the plan and Code Status  Admission status: observation, tele   Disposition plan: Further plan will depend as patient's clinical course evolves and further radiologic and laboratory data become available.    At the time of  admission, it appears that the appropriate admission status for this patient is observation . This is judged to be reasonable and necessary in order to provide the required intensity of service to ensure the patient's safety given the presenting symptoms of borderline BP, weakness, physical exam findings, and initial radiographic and laboratory data in the context of their chronic comorbidities.  The medical decision making on this patient was of high complexity and the patient is at high risk for clinical deterioration, therefore this is a level 3 visit.     Time Spent on Admission: 12mins       M.D. [facility] 01/25/2019, 5:31 PM

## 2019-01-25 NOTE — ED Notes (Signed)
Pt also reports Sunday after coming home from the hospital the pt slid out of his chair, denies hitting his head or LOC but states he laid in the floor until a family member woke up the next morning to help him up

## 2019-01-25 NOTE — ED Notes (Signed)
Attempted report 

## 2019-01-25 NOTE — ED Notes (Signed)
PO hydration provided. Swallowed with no issue. Pt denies pain when asked.

## 2019-01-25 NOTE — ED Notes (Signed)
Per pt request called Nathan Cunningham @ [phone] to give update of pt's status.

## 2019-01-25 NOTE — ED Provider Notes (Signed)
[facility] EMERGENCY DEPARTMENT Provider Note   CSN: [id number] Arrival date & time: 01/25/19  1516    History   Chief Complaint Chief Complaint  Patient presents with   Weakness   Hypotension    HPI Nathan Cunningham is a 85 y.o. male.     The history is provided by the patient and medical records.  Weakness Severity:  Moderate Onset quality:  Gradual Duration:  5 days Timing:  Constant Progression:  Unchanged Chronicity:  New Context: change in medication, recent infection, stress and urinary tract infection   Context: not dehydration and not increased activity   Relieved by:  Nothing Worsened by:  Activity and stress Ineffective treatments:  Rest, sleep, lying down, medication, drinking fluids and eating Associated symptoms: difficulty walking   Associated symptoms: no abdominal pain, no chest pain, no dizziness, no fever, no loss of consciousness, no nausea, no syncope and no vomiting   Risk factors: congestive heart failure, diabetes and new medications     Past Medical History:  Diagnosis Date   Adenomatous polyp of colon 03/2003   Anemia    Constipation    Diverticulosis    Dizziness    ETOH abuse    binge drinking   Hemorrhoids    Hyperlipidemia    Hypertension    Macular degeneration    Right eye   OSA on CPAP    Type II diabetes mellitus ([place])     Patient Active Problem List   Diagnosis Date Noted   Acute CHF (congestive heart failure) ([place]) 01/25/2019   Hypotension 01/25/2019   Acute respiratory failure with hypoxia ([place]) 01/16/2019   Bronchiolitis 01/16/2019   Alcohol abuse 01/16/2019   Lower leg edema 01/16/2019   Elevated troponin 01/16/2019   Acute respiratory failure ([place]) 01/16/2019   Hypoxia 01/15/2019   Arthritis 01/14/2019   Insomnia 07/11/2018   Type 2 diabetes mellitus with diabetic neuropathy, unspecified ([place]) 04/04/2018   OSA (obstructive sleep apnea) 07/06/2016   Vitamin  B12 deficiency 06/07/2016   Hemorrhoids 03/05/2016   Thrombocytopenia ([place]) 03/03/2016   UTI (urinary tract infection) 03/03/2016   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   Chronic fatigue and malaise 07/22/2014   Nocturia 03/19/2014   Complex sleep apnea syndrome 04/12/2012   Thoracic degenerative disc disease 09/28/2010   ATRIAL FLUTTER 02/27/2010   RISK OF FALLING 10/21/2009   ERECTILE DYSFUNCTION, 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   URINARY FREQUENCY, CHRONIC 10/02/2007   BPH (benign prostatic hyperplasia) 08/31/2007   HTN (hypertension) 04/24/2007   Diabetes mellitus with complication ([place]) 41/28/7867   B12 deficiency 03/23/2007   DISORDER, CERVICAL DISC W/MYELOPATHY 03/23/2007   STENOSIS, LUMBAR SPINE 03/23/2007    Past Surgical History:  Procedure Laterality Date   ANTERIOR CERVICAL DECOMP/DISCECTOMY FUSION     APPENDECTOMY  1935   BACK SURGERY     CATARACT EXTRACTION Left    INGUINAL HERNIA REPAIR  1935   TONSILLECTOMY          Home Medications    Prior to Admission medications   Medication Sig Start Date End Date Taking? Authorizing Provider  aspirin 325 MG tablet Take 162.5 mg by mouth daily.    Yes [provider]  brimonidine-timolol (COMBIGAN) 0.2-0.5 % ophthalmic solution Place 1 drop into both eyes every 12 (twelve) hours.   Yes [provider]  Chromium Picolinate (RA CHROMIUM PICOLINATE) 400 MCG TABS Take 400 mcg by mouth daily.    Yes [provider]  diphenhydramine-acetaminophen (TYLENOL PM) 25-500 MG TABS tablet  Take 1 tablet by mouth at bedtime.    Yes [provider]  KOMBIGLYZE XR 12-998 MG TB24 TAKE 1 TABLET BY MOUTH DAILY Patient taking differently: Take 1 tablet by mouth daily.  09/27/18  Yes Lucille Passy, MD  OXYGEN Inhale 2-3 L into the lungs continuous.    Yes [provider]  pregabalin (LYRICA) 75 MG capsule TAKE ONE CAPSULE BY MOUTH TWICE DAILY. GENERIC EQUIVALENT FOR LYRICA. Patient  taking differently: Take 150 mg by mouth every evening.  12/21/18  Yes Lucille Passy, MD  telmisartan (MICARDIS) 40 MG tablet Take 40 mg by mouth daily.   Yes [provider]  cyanocobalamin (,VITAMIN B-12,) 1000 MCG/ML injection Inject 1031mcg every 60days IM (every other month) Patient taking differently: Inject 1,000 mcg into the muscle.  03/21/14   Lucille Passy, MD  dextromethorphan-guaiFENesin [facility] DM) 30-600 MG 12hr tablet Take 1 tablet by mouth 2 (two) times daily as needed for cough. 01/22/19   Allie Bossier, MD  folic acid (FOLVITE) 1 MG tablet Take 1 tablet (1 mg total) by mouth daily. 01/23/19   Allie Bossier, MD  glucose blood (ONE TOUCH ULTRA TEST) test strip CHECK BLOOD SUGAR ONCE DAILY AND AS DIRECTED. 07/20/16   Lucille Passy, MD  irbesartan (AVAPRO) 150 MG tablet Take 1 tablet (150 mg total) by mouth daily. 01/23/19   Allie Bossier, MD  JANUVIA 100 MG tablet TAKE 1 TABLET BY MOUTH DAILY Patient taking differently: Take 100 mg by mouth daily.  09/27/18   Lucille Passy, MD  Multiple Vitamin (MULTI-VITAMIN) tablet Take 1 tablet by mouth daily.     [provider]  Multiple Vitamins-Minerals (EYE-VITES) TABS Take 1 tablet by mouth 2 (two) times daily.      [provider]  thiamine 100 MG tablet Take 1 tablet (100 mg total) by mouth daily. 01/23/19   Allie Bossier, MD    Family History Family History  Problem Relation Age of Onset   Heart disease Father    Stroke Father 60    Social History Social History   Tobacco Use   Smoking status: Never Smoker   Smokeless tobacco: Never Used  Substance Use Topics   Alcohol use: Yes    Alcohol/week: 21.0 standard drinks    Types: 14 Standard drinks or equivalent, 7 Shots of liquor per week    Comment: "1 mixed drink/day"   Drug use: No     Allergies   Tape   Review of Systems Review of Systems  Constitutional: Negative for fever.  Cardiovascular: Negative for chest pain and syncope.    Gastrointestinal: Negative for abdominal pain, nausea and vomiting.  Neurological: Positive for weakness. Negative for dizziness and loss of consciousness.  All other systems reviewed and are negative.    Physical Exam Updated Vital Signs BP (!) 112/59 (BP Location: Right Arm)    Pulse 72    Temp 98 F (36.7 C) (Oral)    Resp 19    Ht 5\' 7"  (1.702 m)    Wt 81.6 kg    SpO2 100%    BMI 28.19 kg/m   Physical Exam Vitals signs and nursing note reviewed.  Constitutional:      Appearance: He is well-developed.     Comments: Appears fatigued  HENT:     Head: Normocephalic and atraumatic.     Mouth/Throat:     Comments: Uvula midline No obvious asymmetric palatine elevation No obvious swellings posterior oropharynx  No obvious swellings appreciated on neck exam Patient able to move neck in all directions without difficulty, tolerating secretions appropriately  Eyes:     Conjunctiva/sclera: Conjunctivae normal.     Comments: Right eyelid droops compared to the left, patient states this is baseline for him, macular degeneration right eye, decreased visual acuity right eye which is baseline for patient  Neck:     Musculoskeletal: Neck supple. No muscular tenderness.  Cardiovascular:     Rate and Rhythm: Bradycardia present.  Pulmonary:     Effort: Pulmonary effort is normal.     Breath sounds: No stridor.     Comments: O2 saturation 96% on 2 L nasal cannula Abdominal:     Palpations: Abdomen is soft.     Tenderness: There is no abdominal tenderness.  Musculoskeletal:     Right lower leg: Edema present.     Left lower leg: Edema present.     Comments: 3+ pitting edema bilateral lower extremities  Skin:    General: Skin is warm and dry.     Comments: No obvious skin breakdown or ulcer appreciated over sacrum  Neurological:     Mental Status: He is alert and oriented to person, place, and time.     Comments: Patient can move all extremities spontaneously however unable to bear  weight independently without assistance, this is been the case since discharge      ED Treatments / Results  Labs (all labs ordered are listed, but only abnormal results are displayed) Labs Reviewed  CBC WITH DIFFERENTIAL/PLATELET - Abnormal; Notable for the following components:      Result Value   WBC 14.4 (*)    RBC 3.70 (*)    Hemoglobin 11.1 (*)    HCT 34.9 (*)    Platelets 135 (*)    Neutro Abs 10.7 (*)    Monocytes Absolute 2.7 (*)    All other components within normal limits  BASIC METABOLIC PANEL - Abnormal; Notable for the following components:   Sodium 128 (*)    Chloride 90 (*)    Glucose, Bld 119 (*)    BUN 52 (*)    Creatinine, Ser 1.54 (*)    Calcium 8.7 (*)    GFR calc non Af Amer 39 (*)    GFR calc Af Amer 45 (*)    All other components within normal limits  HEPATIC FUNCTION PANEL - Abnormal; Notable for the following components:   Total Protein 6.0 (*)    Albumin 3.1 (*)    AST 10 (*)    Alkaline Phosphatase 37 (*)    All other components within normal limits  URINALYSIS, ROUTINE W REFLEX MICROSCOPIC - Abnormal; Notable for the following components:   Color, Urine AMBER (*)    APPearance CLOUDY (*)    Hgb urine dipstick LARGE (*)    Leukocytes,Ua MODERATE (*)    RBC / HPF >50 (*)    Bacteria, UA RARE (*)    All other components within normal limits  NOVEL CORONAVIRUS, NAA (HOSPITAL ORDER, SEND-OUT TO REF LAB)  BRAIN NATRIURETIC PEPTIDE  TROPONIN I  MAGNESIUM    EKG EKG Interpretation  Date/Time:  Thursday January 25 2019 15:33:08 EDT Ventricular Rate:  59 PR Interval:    QRS Duration: 91 QT Interval:  418 QTC Calculation: 415 R Axis:   -21 Text Interpretation:  Sinus rhythm Borderline left axis deviation Low voltage, precordial leads Consider anterior infarct No significant change since last tracing Confirmed by Blanchie Dessert [id number])  on 01/25/2019 4:12:22 PM   Radiology Dg Chest Portable 1 View  Result Date: 01/25/2019 CLINICAL DATA:   Hypotension, weakness, pitting edema. Recent hospitalization for respiratory failure. History of CHF, hypertension, diabetes. EXAM: PORTABLE CHEST 1 VIEW COMPARISON:  Chest x-rays dated 01/15/2019 and 04/20/2016. FINDINGS: Stable cardiomegaly. Coarse lung markings bilaterally suggesting some degree of chronic interstitial lung disease. No confluent opacity to suggest a superimposed consolidating pneumonia. Chronic blunting of the LEFT costophrenic angle. Probable chronic atelectasis/scarring at the RIGHT lung base. No evidence of pleural effusion or pneumothorax. Osseous structures about the chest are unremarkable. IMPRESSION: 1. No evidence of acute cardiopulmonary abnormality. No evidence of pneumonia or pulmonary edema. 2. Stable cardiomegaly. Probable chronic interstitial lung disease. Electronically Signed   By: Franki Cabot M.D.   On: 01/25/2019 16:20    Procedures Procedures (including critical care time)  Medications Ordered in ED Medications  0.9 %  sodium chloride infusion (has no administration in time range)     Initial Impression / Assessment and Plan / ED Course  I have reviewed the triage vital signs and the nursing notes.  Pertinent labs & imaging results that were available during my care of the patient were reviewed by me and considered in my medical decision making (see chart for details).        Medical Decision Making: Nathan Cunningham is a 85 y.o. male who presented to the ED today with hypotension, weakness.  Past medical history significant for hypertension, hyperlipidemia, diabetes mellitus, anemia, alcohol abuse, OSA on CPAP, myelomonocytic leukemia, atrial flutter Reviewed and confirmed nursing documentation for past medical history, family history, social history.  Recently admitted from 6/9 through 6/15 for community-acquired pneumonia and UTI, was discharged home on oxygen given ambulatory O2 sats would drop below 89%, hospital recommended discharge to SNF  for rehabilitation but patient refused, patient was also hospitalized for acute on chronic diastolic heart failure, admission weight was 81.6 kg, weight on 6/14 is documented as 84.6 kg, Norvasc, spironolactone discontinued, Lasix discontinued due to potential overdiuresis and increasing creatinine.  Patient intermittently hypotensive during last hospitalization, etiology considered multifactorial secondary to heart failure, hypoalbuminemia, overdiuresis  On my initial exam, the pt was calm, cooperative, conversant, follows commands appropriately, GCS 15, not tachycardic, heart rate in the 50s, blood pressure 90/50, afebrile, comfortable on 2 L nasal cannula, O2 sat 96%, no increased work of breathing or respiratory distress, no signs of impending respiratory failure .  Patient has been largely confined to chair/bed since discharge, has been unable to ambulate at home since discharge, occupational therapy came to evaluate patient, assessed blood pressure, blood pressure was 84/54, EMS was called patient brought to the hospital, etiology of hypotension likely multifactorial, could be continuation of etiologies considered during previous hospitalization, could be to recurrence of occult infection whether respiratory or GU, patient has been off diuresis, has been using the restroom spontaneously, signs of volume overload on exam, 3+ pitting edema bilateral lower extremities, still requiring oxygen.  Will obtain blood work, urine studies, x-ray imaging for further evaluation and care. Patient denies any new pain, no chest pain, no abdominal pain, no nausea or vomiting, no diarrhea.  No new focal neuro deficits.  EKG (my interpretation):      Sinus Brady rhythm with a rate of  59.      QRS 91. QTc 415. PR 176.      No acute ischemic ST-T segment changes.      No acute changes suggestive of hyperkalemia.  No WPW, LQTS, or Brugada's Syndrome. When compared to previous ECGs from 01/20/19 change no new  concerning changes found.  Creatinine elevated, patient hyponatremic, BUN elevated, initial troponin negative, no emergent abnormalities on chest x-ray, albumin low Discussed with patient, family, will admit patient for volume management, blood pressure observation, further evaluation and titration of medications  All radiology and laboratory studies reviewed independently and with my attending physician, agree with reading provided by radiologist unless otherwise noted.  Upon reassessing patient, patient was calm, resting comfortably Based on the above findings, I believe patient requires admission. Patient admitted. The above care was discussed with and agreed upon by my attending physician. Emergency Department Medication Summary:  Medications  0.9 %  sodium chloride infusion (has no administration in time range)    Final Clinical Impressions(s) / ED Diagnoses   Final diagnoses:  Weakness    ED Discharge Orders    None       Lonzo Candy, MD 01/25/19 1800    Blanchie Dessert, MD 01/25/19 2029

## 2019-01-26 ENCOUNTER — Encounter (HOSPITAL_COMMUNITY): Payer: Self-pay

## 2019-01-26 DIAGNOSIS — E669 Obesity, unspecified: Secondary | ICD-10-CM | POA: Diagnosis present

## 2019-01-26 DIAGNOSIS — N179 Acute kidney failure, unspecified: Secondary | ICD-10-CM | POA: Diagnosis present

## 2019-01-26 DIAGNOSIS — Z1159 Encounter for screening for other viral diseases: Secondary | ICD-10-CM | POA: Diagnosis not present

## 2019-01-26 DIAGNOSIS — R600 Localized edema: Secondary | ICD-10-CM | POA: Diagnosis not present

## 2019-01-26 DIAGNOSIS — G4733 Obstructive sleep apnea (adult) (pediatric): Secondary | ICD-10-CM | POA: Diagnosis present

## 2019-01-26 DIAGNOSIS — I959 Hypotension, unspecified: Secondary | ICD-10-CM | POA: Diagnosis present

## 2019-01-26 DIAGNOSIS — E860 Dehydration: Secondary | ICD-10-CM | POA: Diagnosis present

## 2019-01-26 DIAGNOSIS — R531 Weakness: Secondary | ICD-10-CM | POA: Diagnosis present

## 2019-01-26 DIAGNOSIS — I5032 Chronic diastolic (congestive) heart failure: Secondary | ICD-10-CM | POA: Diagnosis present

## 2019-01-26 DIAGNOSIS — E871 Hypo-osmolality and hyponatremia: Secondary | ICD-10-CM | POA: Diagnosis present

## 2019-01-26 DIAGNOSIS — Z7982 Long term (current) use of aspirin: Secondary | ICD-10-CM | POA: Diagnosis not present

## 2019-01-26 DIAGNOSIS — T50905A Adverse effect of unspecified drugs, medicaments and biological substances, initial encounter: Secondary | ICD-10-CM | POA: Diagnosis present

## 2019-01-26 DIAGNOSIS — Z6830 Body mass index (BMI) 30.0-30.9, adult: Secondary | ICD-10-CM | POA: Diagnosis not present

## 2019-01-26 DIAGNOSIS — I952 Hypotension due to drugs: Secondary | ICD-10-CM | POA: Diagnosis not present

## 2019-01-26 DIAGNOSIS — E875 Hyperkalemia: Secondary | ICD-10-CM | POA: Diagnosis not present

## 2019-01-26 DIAGNOSIS — E1140 Type 2 diabetes mellitus with diabetic neuropathy, unspecified: Secondary | ICD-10-CM | POA: Diagnosis present

## 2019-01-26 DIAGNOSIS — N400 Enlarged prostate without lower urinary tract symptoms: Secondary | ICD-10-CM | POA: Diagnosis present

## 2019-01-26 DIAGNOSIS — Z79899 Other long term (current) drug therapy: Secondary | ICD-10-CM | POA: Diagnosis not present

## 2019-01-26 DIAGNOSIS — R310 Gross hematuria: Secondary | ICD-10-CM | POA: Diagnosis present

## 2019-01-26 DIAGNOSIS — Z9981 Dependence on supplemental oxygen: Secondary | ICD-10-CM | POA: Diagnosis not present

## 2019-01-26 DIAGNOSIS — J441 Chronic obstructive pulmonary disease with (acute) exacerbation: Secondary | ICD-10-CM | POA: Diagnosis present

## 2019-01-26 DIAGNOSIS — R319 Hematuria, unspecified: Secondary | ICD-10-CM | POA: Diagnosis not present

## 2019-01-26 DIAGNOSIS — Z8249 Family history of ischemic heart disease and other diseases of the circulatory system: Secondary | ICD-10-CM | POA: Diagnosis not present

## 2019-01-26 DIAGNOSIS — E785 Hyperlipidemia, unspecified: Secondary | ICD-10-CM | POA: Diagnosis present

## 2019-01-26 DIAGNOSIS — I110 Hypertensive heart disease with heart failure: Secondary | ICD-10-CM | POA: Diagnosis present

## 2019-01-26 DIAGNOSIS — Z823 Family history of stroke: Secondary | ICD-10-CM | POA: Diagnosis not present

## 2019-01-26 DIAGNOSIS — J9611 Chronic respiratory failure with hypoxia: Secondary | ICD-10-CM | POA: Diagnosis present

## 2019-01-26 DIAGNOSIS — I4892 Unspecified atrial flutter: Secondary | ICD-10-CM | POA: Diagnosis present

## 2019-01-26 LAB — BASIC METABOLIC PANEL
Anion gap: 8 (ref 5–15)
BUN: 46 mg/dL — ABNORMAL HIGH (ref 8–23)
CO2: 29 mmol/L (ref 22–32)
Calcium: 8.5 mg/dL — ABNORMAL LOW (ref 8.9–10.3)
Chloride: 92 mmol/L — ABNORMAL LOW (ref 98–111)
Creatinine, Ser: 1.02 mg/dL (ref 0.61–1.24)
GFR calc Af Amer: >60 mL/min (ref >60)
GFR calc non Af Amer: >60 mL/min (ref >60)
Glucose, Bld: 149 mg/dL — ABNORMAL HIGH (ref 70–99)
Potassium: 5 mmol/L (ref 3.5–5.1)
Sodium: 129 mmol/L — ABNORMAL LOW (ref 135–145)

## 2019-01-26 LAB — CBC
HCT: 33.2 % — ABNORMAL LOW (ref 39.0–52.0)
Hemoglobin: 10.7 g/dL — ABNORMAL LOW (ref 13.0–17.0)
MCH: 30.1 pg (ref 26.0–34.0)
MCHC: 32.2 g/dL (ref 30.0–36.0)
MCV: 93.3 fL (ref 80.0–100.0)
Platelets: 123 10*3/uL — ABNORMAL LOW (ref 150–400)
RBC: 3.56 MIL/uL — ABNORMAL LOW (ref 4.22–5.81)
RDW: 13.8 % (ref 11.5–15.5)
WBC: 12.5 10*3/uL — ABNORMAL HIGH (ref 4.0–10.5)
nRBC: 0 % (ref 0.0–0.2)

## 2019-01-26 LAB — GLUCOSE, CAPILLARY
Glucose-Capillary: 158 mg/dL — ABNORMAL HIGH (ref 70–99)
Glucose-Capillary: 175 mg/dL — ABNORMAL HIGH (ref 70–99)
Glucose-Capillary: 142 mg/dL — ABNORMAL HIGH (ref 70–99)
Glucose-Capillary: 188 mg/dL — ABNORMAL HIGH (ref 70–99)

## 2019-01-26 MED ORDER — TRAZODONE HCL 50 MG PO TABS
50.0000 mg | ORAL_TABLET | Freq: Once | ORAL | Status: AC
  Administered 2019-01-26: 50 mg via ORAL
  Filled 2019-01-26: qty 1

## 2019-01-26 NOTE — Evaluation (Signed)
Physical Therapy Evaluation Patient Details Name: Nathan Cunningham MRN: [id number] DOB: 1925/05/16 Today's Date: 01/26/2019   History of Present Illness  Pt is a 85 yo male admitted secondary to hypotension. Pt with recent admission secondary to respiratory failure. PMH includes DM, a flutter, HTN, dCHF, and OSA on CPAP.   Clinical Impression  Pt admitted secondary to problem above with deficits below. Pt requiring mod to max A for mobility tasks using RW this session. Pt reports significant decline in mobility over the past couple of weeks to the point he was having difficulty ambulating. Feel pt will require increased assist at d/c. Will continue to follow acutely to maximize functional mobility independence and safety.     Follow Up Recommendations SNF;Supervision/Assistance - 24 hour    Equipment Recommendations  Wheelchair cushion (measurements PT);Wheelchair (measurements PT)    Recommendations for Other Services       Precautions / Restrictions Precautions Precautions: Fall Restrictions Weight Bearing Restrictions: No      Mobility  Bed Mobility Overal bed mobility: Needs Assistance Bed Mobility: Supine to Sit     Supine to sit: Max assist     General bed mobility comments: Max A for LE assist, scooting hips to EOB and trunk elevation. Increased time required.   Transfers Overall transfer level: Needs assistance Equipment used: Rolling walker (2 wheeled) Transfers: Sit to/from [facility] Sit to Stand: Mod assist;From elevated surface Stand pivot transfers: Mod assist       General transfer comment: Mod A to stand from elevated surface. Pt with very flexed posture throughout transfer to chair and required mod A for steadying. Pt also with increased knee flexion and required cues to extend LEs and stand up tall throughout transfer.   Ambulation/Gait                Stairs            Wheelchair Mobility    Modified Rankin  (Stroke Patients Only)       Balance Overall balance assessment: Needs assistance Sitting-balance support: No upper extremity supported;Feet supported Sitting balance-Leahy Scale: Fair     Standing balance support: Bilateral upper extremity supported;During functional activity Standing balance-Leahy Scale: Poor Standing balance comment: Reliant on BUE and external support                              Pertinent Vitals/Pain Pain Assessment: No/denies pain    Home Living Family/patient expects to be discharged to:: Private residence Living Arrangements: Spouse/significant other Available Help at Discharge: Family;Available 24 hours/day Type of Home: House Home Access: Ramped entrance     Home Layout: One level Home Equipment: Walker - 4 wheels;Shower seat;Walker - 2 wheels      Prior Function Level of Independence: Needs assistance   Gait / Transfers Assistance Needed: Reports prior to admission, he had not been able to ambulate secondary to weakness. Has declined within the past two weeks, as he was previously able to ambulate with rollator without assist.   ADL's / Homemaking Assistance Needed: Has required increased assist with ADLs since last admission. Prior to that, was independent.         Hand Dominance        Extremity/Trunk Assessment   Upper Extremity Assessment Upper Extremity Assessment: Defer to OT evaluation    Lower Extremity Assessment Lower Extremity Assessment: Generalized weakness    Cervical / Trunk Assessment Cervical / Trunk Assessment: Kyphotic  Communication   Communication: No difficulties  Cognition Arousal/Alertness: Awake/alert Behavior During Therapy: WFL for tasks assessed/performed Overall Cognitive Status: Within Functional Limits for tasks assessed                                        General Comments      Exercises     Assessment/Plan    PT Assessment Patient needs continued PT  services  PT Problem List Decreased strength;Decreased activity tolerance;Decreased balance;Decreased mobility;Decreased knowledge of use of DME       PT Treatment Interventions DME instruction;Gait training;Functional mobility training;Therapeutic activities;Therapeutic exercise;Balance training;Patient/family education    PT Goals (Current goals can be found in the Care Plan section)  Acute Rehab PT Goals Patient Stated Goal: to get stronger before going home PT Goal Formulation: With patient Time For Goal Achievement: 02/09/19 Potential to Achieve Goals: Fair    Frequency Min 2X/week   Barriers to discharge        Co-evaluation               AM-PAC PT "6 Clicks" Mobility  Outcome Measure Help needed turning from your back to your side while in a flat bed without using bedrails?: Total Help needed moving from lying on your back to sitting on the side of a flat bed without using bedrails?: Total Help needed moving to and from a bed to a chair (including a wheelchair)?: A Lot Help needed standing up from a chair using your arms (e.g., wheelchair or bedside chair)?: A Lot Help needed to walk in hospital room?: A Lot Help needed climbing 3-5 steps with a railing? : Total 6 Click Score: 9    End of Session Equipment Utilized During Treatment: Gait belt;Oxygen Activity Tolerance: Patient tolerated treatment well Patient left: in chair;with call bell/phone within reach;with chair alarm set Nurse Communication: Mobility status;Other (comment)(use stedy for transfer back to chair) PT Visit Diagnosis: Difficulty in walking, not elsewhere classified (R26.2);Muscle weakness (generalized) (M62.81);Unsteadiness on feet (R26.81)    Time: 0981-1914 PT Time Calculation (min) (ACUTE ONLY): 23 min   Charges:   PT Evaluation $PT Eval Moderate Complexity: 1 Mod PT Treatments $Therapeutic Activity: 8-22 mins        Leighton Ruff, PT, DPT  Acute Rehabilitation Services   Pager: [phone] Office: [phone]   Rudean Hitt 01/26/2019, 12:00 PM

## 2019-01-26 NOTE — NC FL2 (Signed)
[place] LEVEL OF CARE SCREENING TOOL     IDENTIFICATION  Patient Name: Nathan Cunningham Birthdate: 11-27-1924 Sex: male Admission Date (Current Location): 01/25/2019  [facility] and [place] Number:  Herbalist and Address:  [facility] [place], [place] [address], [place], [place] [address]      Provider Number: [id number]  Attending Physician Name and Address:  Geradine Girt, DO  Relative Name and Phone Number:  Einar Crow, significant other, [phone]    Current Level of Care: Hospital Recommended Level of Care: [place] Prior Approval Number:    Date Approved/Denied:   PASRR Number: [id number]  Discharge Plan: SNF    Current Diagnoses: Patient Active Problem List   Diagnosis Date Noted  . Acute CHF (congestive heart failure) ([place]) 01/25/2019  . Hypotension 01/25/2019  . Acute respiratory failure with hypoxia ([place]) 01/16/2019  . Bronchiolitis 01/16/2019  . Alcohol abuse 01/16/2019  . Lower leg edema 01/16/2019  . Elevated troponin 01/16/2019  . Acute respiratory failure ([place]) 01/16/2019  . Hypoxia 01/15/2019  . Arthritis 01/14/2019  . Insomnia 07/11/2018  . Type 2 diabetes mellitus with diabetic neuropathy, unspecified ([place]) 04/04/2018  . OSA (obstructive sleep apnea) 07/06/2016  . Vitamin B12 deficiency 06/07/2016  . Hemorrhoids 03/05/2016  . Thrombocytopenia ([place]) 03/03/2016  . UTI (urinary tract infection) 03/03/2016  . 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 Chronic fatigue and malaise 07/22/2014  . Nocturia 03/19/2014  . 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 Thoracic degenerative disc disease 09/28/2010  . ATRIAL FLUTTER 02/27/2010  . RISK OF FALLING 10/21/2009  . 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 URINARY FREQUENCY, CHRONIC 10/02/2007  . BPH (benign prostatic hyperplasia) 08/31/2007  . HTN (hypertension) 04/24/2007  . Diabetes mellitus with complication ([place]) 61/95/0932   . B12 deficiency 03/23/2007  . DISORDER, CERVICAL [place] W/MYELOPATHY 03/23/2007  . STENOSIS, LUMBAR SPINE 03/23/2007    Orientation RESPIRATION BLADDER Height & Weight     Self, Time, Situation, Place  O2(Nasal Cannula 2L) Continent Weight: 196 lb 6.9 oz (89.1 kg) Height:  5\' 7"  (170.2 cm)  BEHAVIORAL SYMPTOMS/MOOD NEUROLOGICAL BOWEL NUTRITION STATUS      Continent Diet(heart healthy/carb modified, thin liquids)  AMBULATORY STATUS COMMUNICATION OF NEEDS Skin   Limited Assist Verbally Normal                       Personal Care Assistance Level of Assistance  Dressing, Bathing, Feeding Bathing Assistance: Limited assistance Feeding assistance: Independent Dressing Assistance: Limited assistance     Functional Limitations Info  Sight, Speech, Hearing Sight Info: Adequate Hearing Info: Adequate Speech Info: Adequate    SPECIAL CARE FACTORS FREQUENCY  PT (By licensed PT), OT (By licensed OT)     PT Frequency: 5x OT Frequency: 5x            Contractures Contractures Info: Not present    Additional Factors Info  Code Status, Allergies Code Status Info: Full Code Allergies Info: tape           Current Medications (01/26/2019):  This is the current hospital active medication list Current Facility-Administered Medications  Medication Dose Route Frequency Provider Last Rate Last Dose  . 0.9 %  sodium chloride infusion   Intravenous Continuous Rai, Ripudeep K, MD 75 mL/hr at 01/25/19 2126    . acetaminophen (TYLENOL) tablet 650 mg  650 mg Oral Q6H PRN Rai, Ripudeep K, MD   650 mg at 01/26/19 [phone]  Or  . acetaminophen (TYLENOL) suppository 650 mg  650 mg Rectal Q6H PRN Rai, Ripudeep K, MD      . aspirin tablet 162.5 mg  162.5 mg Oral Daily Rai, Ripudeep K, MD   162.5 mg at 01/26/19 0839  . brimonidine (ALPHAGAN) 0.2 % ophthalmic solution 1 drop  1 drop Both Eyes BID Rai, Ripudeep K, MD   1 drop at 01/26/19 0835   And  . timolol (TIMOPTIC) 0.5 % ophthalmic  solution 1 drop  1 drop Both Eyes BID Rai, Ripudeep K, MD      . folic acid (FOLVITE) tablet 1 mg  1 mg Oral Daily Rai, Ripudeep K, MD   1 mg at 01/26/19 0835  . heparin injection 5,000 Units  5,000 Units Subcutaneous Q8H Rai, Ripudeep K, MD   5,000 Units at 01/26/19 4562  . insulin aspart (novoLOG) injection 0-5 Units  0-5 Units Subcutaneous QHS Rai, Ripudeep K, MD      . insulin aspart (novoLOG) injection 0-9 Units  0-9 Units Subcutaneous TID WC Rai, Ripudeep K, MD   2 Units at 01/26/19 0837  . ipratropium-albuterol (DUONEB) 0.5-2.5 (3) MG/3ML nebulizer solution 3 mL  3 mL Nebulization Q6H PRN Rai, Ripudeep K, MD      . multivitamin with minerals tablet 1 tablet  1 tablet Oral Daily Rai, Ripudeep K, MD   1 tablet at 01/26/19 0835  . ondansetron (ZOFRAN) tablet 4 mg  4 mg Oral Q6H PRN Rai, Ripudeep K, MD       Or  . ondansetron (ZOFRAN) injection 4 mg  4 mg Intravenous Q6H PRN Rai, Ripudeep K, MD      . pregabalin (LYRICA) capsule 150 mg  150 mg Oral QPM Rai, Ripudeep K, MD      . thiamine (VITAMIN B-1) tablet 100 mg  100 mg Oral Daily Rai, Ripudeep K, MD   100 mg at 01/26/19 5638     Discharge Medications: Please see discharge summary for a list of discharge medications.  Relevant Imaging Results:  Relevant Lab Results:   Additional Information [id number]  Eileen Stanford, LCSW

## 2019-01-26 NOTE — TOC Initial Note (Addendum)
Transition of Care [facility]) - Initial/Assessment Note    Patient Details  Name: Nathan Cunningham MRN: [id number] Date of Birth: Jul 01, 1925  [facility] CM/SW Contact:    Eileen Stanford, LCSW Phone Number: 01/26/2019, 11:33 AM  Clinical Narrative:    CSW spoke with pt via telephone. Pt has a pending COVID-19 test. Pt lives at home with significant other. Pt states he does not want to go to SNF due to the Pandemic.  Pt states his significant other can take care of him. Pt gave CSW verbal permission to contact his significant other, [name].  CSW spoke with [name] via telephone. [name] states she can not physical care for pt as she is handicapped in that sense. [name] would like for pt to go to [place]. CSW explained pt is alert and oriented therefore we will have to abide by his wishes. [name] is going to call pt and explain that she can not take care of him at home. [name] to follow up with CSW after discussion with pt.    Pt will need COVID-19 test results to be in prior to d/c to SNF.    CSW will need PT notes in order to start insurance auth if pt becomes agreeable to SNF.    Expected Discharge Plan: Skilled Nursing Facility Barriers to Discharge: Continued Medical Work up, Ship broker   Patient Goals and CMS Choice Patient states their goals for this hospitalization and ongoing recovery are:: to get better CMS Medicare.gov Compare Post Acute Care list provided to:: Patient Represenative (must comment)(via telephone) Choice offered to / list presented to : (significant other via telephone)  Expected Discharge Plan and Services Expected Discharge Plan: [place] In-house Referral: Clinical Social Work   Post Acute Care Choice: [place] Living arrangements for the past 2 months: Single Family Home                           HH Arranged: NA          Prior Living Arrangements/Services Living arrangements for the past 2 months:  Single Family Home Lives with:: Significant Other Patient language and need for interpreter reviewed:: Yes Do you feel safe going back to the place where you live?: Yes      Need for Family Participation in Patient Care: Yes (Comment) Care giver support system in place?: Yes (comment) Current home services: Home PT, Home OT Criminal Activity/Legal Involvement Pertinent to Current Situation/Hospitalization: No - Comment as needed  Activities of Daily Living Home Assistive Devices/Equipment: CBG Meter, Walker (specify type) ADL Screening (condition at time of admission) Patient's cognitive ability adequate to safely complete daily activities?: Yes Is the patient deaf or have difficulty hearing?: No Does the patient have difficulty seeing, even when wearing glasses/contacts?: No Does the patient have difficulty concentrating, remembering, or making decisions?: No Patient able to express need for assistance with ADLs?: Yes Does the patient have difficulty dressing or bathing?: Yes Independently performs ADLs?: Yes (appropriate for developmental age) Does the patient have difficulty walking or climbing stairs?: Yes Weakness of Legs: Both Weakness of Arms/Hands: None  Permission Sought/Granted Permission sought to share information with : Family Supports Permission granted to share information with : Yes, Verbal Permission Granted  Share Information with NAME: [name]     Permission granted to share info w Relationship: Significant other     Emotional Assessment Appearance:: Appears stated age Attitude/Demeanor/Rapport: (patient was appropriate) Affect (typically observed):  Accepting Orientation: : Oriented to Place, Oriented to Self, Oriented to  Time, Oriented to Situation Alcohol / Substance Use: Not Applicable Psych Involvement: No (comment)  Admission diagnosis:  Weakness [R53.1] Hypotension [I95.9] Patient Active Problem List   Diagnosis Date Noted  . Acute CHF (congestive  heart failure) ([place]) 01/25/2019  . Hypotension 01/25/2019  . Acute respiratory failure with hypoxia ([place]) 01/16/2019  . Bronchiolitis 01/16/2019  . Alcohol abuse 01/16/2019  . Lower leg edema 01/16/2019  . Elevated troponin 01/16/2019  . Acute respiratory failure ([place]) 01/16/2019  . Hypoxia 01/15/2019  . Arthritis 01/14/2019  . Insomnia 07/11/2018  . Type 2 diabetes mellitus with diabetic neuropathy, unspecified ([place]) 04/04/2018  . OSA (obstructive sleep apnea) 07/06/2016  . Vitamin B12 deficiency 06/07/2016  . Hemorrhoids 03/05/2016  . Thrombocytopenia ([place]) 03/03/2016  . UTI (urinary tract infection) 03/03/2016  . 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 Chronic fatigue and malaise 07/22/2014  . Nocturia 03/19/2014  . 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 Thoracic degenerative disc disease 09/28/2010  . ATRIAL FLUTTER 02/27/2010  . RISK OF FALLING 10/21/2009  . 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 URINARY FREQUENCY, CHRONIC 10/02/2007  . BPH (benign prostatic hyperplasia) 08/31/2007  . HTN (hypertension) 04/24/2007  . Diabetes mellitus with complication ([place]) 46/96/2952  . B12 deficiency 03/23/2007  . 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 STENOSIS, LUMBAR SPINE 03/23/2007   PCP:  Lucille Passy, MD Pharmacy:   [facility], [place] [address] Phone: [phone] Fax: [facility] #[id number] [place], [place] - [address] [place] [address] Phone: [phone] Fax: [phone]  [facility] [place], [place] [address] Phone: [phone] Fax: [phone]  [facility] [place], [place] - [address] [place] [address] Phone: [phone] Fax:  [phone]     Social Determinants of Health (SDOH) Interventions    Readmission Risk Interventions No flowsheet data found.

## 2019-01-26 NOTE — Plan of Care (Signed)
  Problem: Education: Goal: Knowledge of General Education information will improve Description: Including pain rating scale, medication(s)/side effects and non-pharmacologic comfort measures Outcome: Progressing   Problem: Health Behavior/Discharge Planning: Goal: Ability to manage health-related needs will improve Outcome: Progressing   Problem: Clinical Measurements: Goal: Will remain free from infection Outcome: Progressing   Problem: Activity: Goal: Risk for activity intolerance will decrease Outcome: Progressing   Problem: Elimination: Goal: Will not experience complications related to bowel motility Outcome: Progressing   

## 2019-01-26 NOTE — Progress Notes (Signed)
Progress Note    Nathan Cunningham  KDX:[id number] DOB: 1925/08/03  DOA: 01/25/2019 PCP: Lucille Passy, MD    Brief Narrative:     Medical records reviewed and are as summarized below:  Nathan Cunningham is an 85 y.o. male with history of hypertension, hyperlipidemia, diabetes mellitus type 2, NIDDM was just discharged on 01/22/2019 after being treated for acute hypoxic respiratory failure secondary to COPD exacerbation, acute on chronic diastolic CHF (EF 60 to 97% )and UTI.  Patient reports that he has not been able to ambulate and push his walker since his discharge from the hospital due to weakness.   Assessment/Plan:   Principal Problem:   Hypotension Active Problems:   ATRIAL FLUTTER   BPH (benign prostatic hyperplasia)   Type 2 diabetes mellitus with diabetic neuropathy, unspecified (HCC)   Acute respiratory failure with hypoxia (HCC)   Lower leg edema  Gross hematuria -no clots -recent CT Scan questioning bladder thickening -continue to monitor h/h  Hypotension with history of essential hypertension  -BP currently stable. During the previous admission, patient was noticed to be hypotensive and was attributed to overdiuresis and hypoalbuminemia -TED hose -partial orthostatics done  Hyponatremia  - BNP low, CXR clear, no pulmonary edema, peripheral edema likely due to chronic diastolic CHF and hypoalbuminemia  - trend  Acute kidney injury:  - Baseline Cr 0.95 - Likely due to poor oral intake, dehydration, ARB - improved  Chronic diastolic CHF - BNP 67.3, has chronic peripheral edema however chest x-ray shows no acute cardiopulmonary abnormality, no pneumonia or pulmonary edema -2D echo done on 01/16/2019 showed EF 60-65%,impaired relaxation -Place TED hoses, will not be able to give IV or oral Lasix due to borderline BP. Once BP and Na stable, may be able to add low dose lasix at discharge.      Type 2 diabetes mellitus with diabetic neuropathy,  unspecified (HCC) - Hold oral hypoglycemics, obtain HbA1c - place on sensitive SSI  Chronic respiratory failure with hypoxia (HCC) secondary to COPD - On O2 2L at home  Generalized weakness -During the previous admission, PT had recommended skilled nursing facility, patient had refused, currently has home health PT. Patient reports difficulty in ambulation due to weakness since discharge. No focal weakness.  -PT eval-- needs SNF as family not able to manage at home  obesity Body mass index is 30.77 kg/m.   Family Communication/Anticipated D/C date and plan/Code Status   DVT prophylaxis: scd Code Status: Full Code.  Family Communication:  Disposition Plan: needs either placement or extra help at home   Medical Consultants:    None.     Subjective:   Wants to stay in hospital to get "stronger"  Objective:    Vitals:   01/26/19 0053 01/26/19 0336 01/26/19 0806 01/26/19 1126  BP: 128/68 (!) 106/50 (!) 106/45 (!) 104/55  Pulse: 76 62 66 (!) 56  Resp: 18 18    Temp: 98 F (36.7 C)  98.1 F (36.7 C) (!) 97.5 F (36.4 C)  TempSrc: Oral  Oral Oral  SpO2: 96% 90% 91% 92%  Weight:  89.1 kg    Height:        Intake/Output Summary (Last 24 hours) at 01/26/2019 1221 Last data filed at 01/26/2019 0952 Gross per 24 hour  Intake 1896.2 ml  Output 1800 ml  Net 96.2 ml   Filed Weights   01/25/19 1521 01/25/19 2030 01/26/19 0336  Weight: 81.6 kg 89.8 kg 89.1 kg  Exam: In bed, NAD rrr No increased work of breathing +LE edema Condom cath with gross hematuria  Data Reviewed:   I have personally reviewed following labs and imaging studies:  Labs: Labs show the following:   Basic Metabolic Panel: Recent Labs  Lab 01/20/19 0245 01/21/19 0233 01/22/19 0839 01/25/19 1541 01/25/19 2108 01/26/19 0619  NA 130* 129* 134* 128*  --  129*  K 5.4* 5.1 4.8 5.0  --  5.0  CL 87* 86* 92* 90*  --  92*  CO2 34* 33* 32 29  --  29  GLUCOSE 196* 111* 212* 119*  --   149*  BUN 33* 45* 36* 52*  --  46*  CREATININE 0.98 1.26* 0.95 1.54* 1.36* 1.02  CALCIUM 8.6* 8.3* 8.7* 8.7*  --  8.5*  MG 2.0 2.1 2.3 2.3  --   --    GFR Estimated Creatinine Clearance: 50.2 mL/min (by C-G formula based on SCr of 1.02 mg/dL). Liver Function Tests: Recent Labs  Lab 01/20/19 0245 01/25/19 1541  AST 15 10*  ALT 19 17  ALKPHOS 45 37*  BILITOT 0.7 0.9  PROT 6.9 6.0*  ALBUMIN 2.9* 3.1*   No results for input(s): LIPASE, AMYLASE in the last 168 hours. No results for input(s): AMMONIA in the last 168 hours. Coagulation profile No results for input(s): INR, PROTIME in the last 168 hours.  CBC: Recent Labs  Lab 01/21/19 0233 01/22/19 0839 01/25/19 1541 01/25/19 2108 01/26/19 0619  WBC 13.7* 9.7 14.4* 13.4* 12.5*  NEUTROABS  --   --  10.7*  --   --   HGB 11.7* 12.0* 11.1* 11.4* 10.7*  HCT 36.7* 37.8* 34.9* 35.0* 33.2*  MCV 93.6 94.7 94.3 93.6 93.3  PLT 160 148* 135* 134* 123*   Cardiac Enzymes: Recent Labs  Lab 01/25/19 1541  TROPONINI <0.03   BNP (last 3 results) No results for input(s): PROBNP in the last 8760 hours. CBG: Recent Labs  Lab 01/22/19 1622 01/22/19 1954 01/25/19 2049 01/26/19 0720 01/26/19 1205  GLUCAP 114* 247* 197* 158* 175*   D-Dimer: No results for input(s): DDIMER in the last 72 hours. Hgb A1c: Recent Labs    01/25/19 2108  HGBA1C 7.9*   Lipid Profile: No results for input(s): CHOL, HDL, LDLCALC, TRIG, CHOLHDL, LDLDIRECT in the last 72 hours. Thyroid function studies: Recent Labs    01/25/19 2108  TSH 4.061   Anemia work up: No results for input(s): VITAMINB12, FOLATE, FERRITIN, TIBC, IRON, RETICCTPCT in the last 72 hours. Sepsis Labs: Recent Labs  Lab 01/22/19 0839 01/25/19 1541 01/25/19 2108 01/26/19 0619  WBC 9.7 14.4* 13.4* 12.5*    Microbiology No results found for this or any previous visit (from the past 240 hour(s)).  Procedures and diagnostic studies:  Dg Chest Portable 1 View  Result  Date: 01/25/2019 CLINICAL DATA:  Hypotension, weakness, pitting edema. Recent hospitalization for respiratory failure. History of CHF, hypertension, diabetes. EXAM: PORTABLE CHEST 1 VIEW COMPARISON:  Chest x-rays dated 01/15/2019 and 04/20/2016. FINDINGS: Stable cardiomegaly. Coarse lung markings bilaterally suggesting some degree of chronic interstitial lung disease. No confluent opacity to suggest a superimposed consolidating pneumonia. Chronic blunting of the LEFT costophrenic angle. Probable chronic atelectasis/scarring at the RIGHT lung base. No evidence of pleural effusion or pneumothorax. Osseous structures about the chest are unremarkable. IMPRESSION: 1. No evidence of acute cardiopulmonary abnormality. No evidence of pneumonia or pulmonary edema. 2. Stable cardiomegaly. Probable chronic interstitial lung disease. Electronically Signed   By: Roxy Horseman.D.  On: 01/25/2019 16:20    Medications:   . aspirin  162.5 mg Oral Daily  . brimonidine  1 drop Both Eyes BID   And  . timolol  1 drop Both Eyes BID  . folic acid  1 mg Oral Daily  . heparin  5,000 Units Subcutaneous Q8H  . insulin aspart  0-5 Units Subcutaneous QHS  . insulin aspart  0-9 Units Subcutaneous TID WC  . multivitamin with minerals  1 tablet Oral Daily  . pregabalin  150 mg Oral QPM  . thiamine  100 mg Oral Daily   Continuous Infusions:   LOS: 1 day   Geradine Girt  [facility]   How to contact the [facility] Attending or Consulting provider [place] or covering provider during after hours [place], for this patient?  1. Check the care team in [facility] and look for a) attending/consulting TRH provider listed and b) the [facility] team listed 2. Log into [email or web address] and use [place] universal password to access. If you do not have the password, please contact the hospital operator. 3. Locate the [facility] provider you are looking for under [facility] and page to a number that you can be directly reached. 4. If you  still have difficulty reaching the provider, please page the [facility] (Director on Call) for the Hospitalists listed on amion for assistance.  01/26/2019, 12:21 PM

## 2019-01-27 LAB — GLUCOSE, CAPILLARY
Glucose-Capillary: 156 mg/dL — ABNORMAL HIGH (ref 70–99)
Glucose-Capillary: 162 mg/dL — ABNORMAL HIGH (ref 70–99)
Glucose-Capillary: 136 mg/dL — ABNORMAL HIGH (ref 70–99)
Glucose-Capillary: 203 mg/dL — ABNORMAL HIGH (ref 70–99)

## 2019-01-27 LAB — BASIC METABOLIC PANEL
Anion gap: 6 (ref 5–15)
BUN: 30 mg/dL — ABNORMAL HIGH (ref 8–23)
CO2: 31 mmol/L (ref 22–32)
Calcium: 8.9 mg/dL (ref 8.9–10.3)
Chloride: 95 mmol/L — ABNORMAL LOW (ref 98–111)
Creatinine, Ser: 0.74 mg/dL (ref 0.61–1.24)
GFR calc Af Amer: >60 mL/min (ref >60)
GFR calc non Af Amer: >60 mL/min (ref >60)
Glucose, Bld: 175 mg/dL — ABNORMAL HIGH (ref 70–99)
Potassium: 5.5 mmol/L — ABNORMAL HIGH (ref 3.5–5.1)
Sodium: 132 mmol/L — ABNORMAL LOW (ref 135–145)

## 2019-01-27 LAB — CBC
HCT: 35.2 % — ABNORMAL LOW (ref 39.0–52.0)
Hemoglobin: 11.1 g/dL — ABNORMAL LOW (ref 13.0–17.0)
MCH: 29.4 pg (ref 26.0–34.0)
MCHC: 31.5 g/dL (ref 30.0–36.0)
MCV: 93.4 fL (ref 80.0–100.0)
Platelets: 122 10*3/uL — ABNORMAL LOW (ref 150–400)
RBC: 3.77 MIL/uL — ABNORMAL LOW (ref 4.22–5.81)
RDW: 14 % (ref 11.5–15.5)
WBC: 11.2 10*3/uL — ABNORMAL HIGH (ref 4.0–10.5)
nRBC: 0 % (ref 0.0–0.2)

## 2019-01-27 LAB — URINE CULTURE: Culture: NO GROWTH

## 2019-01-27 LAB — NOVEL CORONAVIRUS, NAA (HOSP ORDER, SEND-OUT TO REF LAB; TAT 18-24 HRS): SARS-CoV-2, NAA: NOT DETECTED

## 2019-01-27 MED ORDER — LINAGLIPTIN 5 MG PO TABS
5.0000 mg | ORAL_TABLET | Freq: Every day | ORAL | Status: DC
  Administered 2019-01-27 – 2019-01-30 (×4): 5 mg via ORAL
  Filled 2019-01-27 (×4): qty 1

## 2019-01-27 NOTE — Progress Notes (Signed)
Progress Note    Nathan Cunningham  [id number]:[id number] DOB: 07/30/1925  DOA: 01/25/2019 PCP: Lucille Passy, MD    Brief Narrative:     Medical records reviewed and are as summarized below:  Nathan Cunningham is an 85 y.o. male with history of hypertension, hyperlipidemia, diabetes mellitus type 2, NIDDM was just discharged on 01/22/2019 after being treated for acute hypoxic respiratory failure secondary to COPD exacerbation, acute on chronic diastolic CHF (EF 60 to 40% )and UTI.  Patient reports that he has not been able to ambulate and push his walker since his discharge from the hospital due to weakness.   Assessment/Plan:   Principal Problem:   Hypotension Active Problems:   ATRIAL FLUTTER   BPH (benign prostatic hyperplasia)   Type 2 diabetes mellitus with diabetic neuropathy, unspecified (HCC)   Acute respiratory failure with hypoxia (HCC)   Lower leg edema   Acute CHF (congestive heart failure) (HCC)  Gross hematuria -resolved -recent CT Scan questioning bladder thickening -outpatient urology follow up  Hypotension with history of essential hypertension  -BP currently stable. During the previous admission, patient was noticed to be hypotensive and was attributed to overdiuresis and hypoalbuminemia -TED hose  Hyponatremia  - BNP low, CXR clear, no pulmonary edema, peripheral edema likely due to chronic diastolic CHF and hypoalbuminemia  - trending up  Hyperkalemia -trend -? Hemolysis -not on any replacement Kdur  Acute kidney injury:  - Baseline Cr 0.95 - Likely due to poor oral intake, dehydration, ARB - resolved  Chronic diastolic CHF - BNP 34.7, has chronic peripheral edema however chest x-ray shows no acute cardiopulmonary abnormality, no pneumonia or pulmonary edema -2D echo done on 01/16/2019 showed EF 60-65%,impaired relaxation -Place TED hoses, will not be able to give IV or oral Lasix due to borderline BP. Once BP and Na stable, may be able to add  low dose lasix at discharge.      Type 2 diabetes mellitus with diabetic neuropathy, unspecified ([place]) - resume [place] - place on sensitive SSI  Chronic respiratory failure with hypoxia ([place]) secondary to COPD - On O2 2L at home  Generalized weakness -During the previous admission, PT had recommended skilled nursing facility, patient had refused, currently has home health PT. Patient reports difficulty in ambulation due to weakness since discharge. No focal weakness.  -PT eval-- needs SNF as family not able to manage at home- patient agreeable  obesity Body mass index is 30.85 kg/m.   Family Communication/Anticipated D/C date and plan/Code Status   DVT prophylaxis: scd Code Status: Full Code.  Family Communication:  Disposition Plan: SNF Monday if medically stable   Medical Consultants:    None.     Subjective:   Agreeable to rehab now  Objective:    Vitals:   01/26/19 2017 01/27/19 0508 01/27/19 0510 01/27/19 0855  BP:   (!) 131/53 (!) 151/84  Pulse: 70  76 60  Resp:   18 18  Temp:   98 F (36.7 C) 98.5 F (36.9 C)  TempSrc:   Oral Oral  SpO2: 94%  92%   Weight:  89.4 kg    Height:        Intake/Output Summary (Last 24 hours) at 01/27/2019 1045 Last data filed at 01/27/2019 0400 Gross per 24 hour  Intake 594 ml  Output 1950 ml  Net -1356 ml   Filed Weights   01/25/19 2030 01/26/19 0336 01/27/19 0508  Weight: 89.8 kg 89.1 kg 89.4 kg  Exam: In chair, NAD No LE Edema with TED Hose Foley with clear urine rrr Pleasant and cooperative  Data Reviewed:   I have personally reviewed following labs and imaging studies:  Labs: Labs show the following:   Basic Metabolic Panel: Recent Labs  Lab 01/21/19 0233 01/22/19 0839 01/25/19 1541 01/25/19 2108 01/26/19 0619 01/27/19 0431  NA 129* 134* 128*  --  129* 132*  K 5.1 4.8 5.0  --  5.0 5.5*  CL 86* 92* 90*  --  92* 95*  CO2 33* 32 29  --  29 31  GLUCOSE 111* 212* 119*  --  149* 175*   BUN 45* 36* 52*  --  46* 30*  CREATININE 1.26* 0.95 1.54* 1.36* 1.02 0.74  CALCIUM 8.3* 8.7* 8.7*  --  8.5* 8.9  MG 2.1 2.3 2.3  --   --   --    GFR Estimated Creatinine Clearance: 64.1 mL/min (by C-G formula based on SCr of 0.74 mg/dL). Liver Function Tests: Recent Labs  Lab 01/25/19 1541  AST 10*  ALT 17  ALKPHOS 37*  BILITOT 0.9  PROT 6.0*  ALBUMIN 3.1*   No results for input(s): LIPASE, AMYLASE in the last 168 hours. No results for input(s): AMMONIA in the last 168 hours. Coagulation profile No results for input(s): INR, PROTIME in the last 168 hours.  CBC: Recent Labs  Lab 01/22/19 0839 01/25/19 1541 01/25/19 2108 01/26/19 0619 01/27/19 0431  WBC 9.7 14.4* 13.4* 12.5* 11.2*  NEUTROABS  --  10.7*  --   --   --   HGB 12.0* 11.1* 11.4* 10.7* 11.1*  HCT 37.8* 34.9* 35.0* 33.2* 35.2*  MCV 94.7 94.3 93.6 93.3 93.4  PLT 148* 135* 134* 123* 122*   Cardiac Enzymes: Recent Labs  Lab 01/25/19 1541  TROPONINI <0.03   BNP (last 3 results) No results for input(s): PROBNP in the last 8760 hours. CBG: Recent Labs  Lab 01/26/19 0720 01/26/19 1205 01/26/19 1612 01/26/19 2116 01/27/19 0616  GLUCAP 158* 175* 142* 188* 156*   D-Dimer: No results for input(s): DDIMER in the last 72 hours. Hgb A1c: Recent Labs    01/25/19 2108  HGBA1C 7.9*   Lipid Profile: No results for input(s): CHOL, HDL, LDLCALC, TRIG, CHOLHDL, LDLDIRECT in the last 72 hours. Thyroid function studies: Recent Labs    01/25/19 2108  TSH 4.061   Anemia work up: No results for input(s): VITAMINB12, FOLATE, FERRITIN, TIBC, IRON, RETICCTPCT in the last 72 hours. Sepsis Labs: Recent Labs  Lab 01/25/19 1541 01/25/19 2108 01/26/19 0619 01/27/19 0431  WBC 14.4* 13.4* 12.5* 11.2*    Microbiology Recent Results (from the past 240 hour(s))  Urine culture     Status: None   Collection Time: 01/25/19  9:19 PM   Specimen: Urine, Random  Result Value Ref Range Status   Specimen Description  URINE, RANDOM  Final   Special Requests NONE  Final   Culture   Final    NO GROWTH Performed at [facility], [address], [place], [place] [address]    Report Status 01/27/2019 FINAL  Final    Procedures and diagnostic studies:  Dg Chest Portable 1 View  Result Date: 01/25/2019 CLINICAL DATA:  Hypotension, weakness, pitting edema. Recent hospitalization for respiratory failure. History of CHF, hypertension, diabetes. EXAM: PORTABLE CHEST 1 VIEW COMPARISON:  Chest x-rays dated 01/15/2019 and 04/20/2016. FINDINGS: Stable cardiomegaly. Coarse lung markings bilaterally suggesting some degree of chronic interstitial lung disease. No confluent opacity to  suggest a superimposed consolidating pneumonia. Chronic blunting of the LEFT costophrenic angle. Probable chronic atelectasis/scarring at the RIGHT lung base. No evidence of pleural effusion or pneumothorax. Osseous structures about the chest are unremarkable. IMPRESSION: 1. No evidence of acute cardiopulmonary abnormality. No evidence of pneumonia or pulmonary edema. 2. Stable cardiomegaly. Probable chronic interstitial lung disease. Electronically Signed   By: Franki Cabot M.D.   On: 01/25/2019 16:20    Medications:   . aspirin  162.5 mg Oral Daily  . brimonidine  1 drop Both Eyes BID   And  . timolol  1 drop Both Eyes BID  . folic acid  1 mg Oral Daily  . insulin aspart  0-5 Units Subcutaneous QHS  . insulin aspart  0-9 Units Subcutaneous TID WC  . linagliptin  5 mg Oral Daily  . multivitamin with minerals  1 tablet Oral Daily  . pregabalin  150 mg Oral QPM  . thiamine  100 mg Oral Daily   Continuous Infusions:   LOS: 2 days   Geradine Girt  [facility]   How to contact the [facility] Attending or Consulting provider [place] or covering provider during after hours [place], for this patient?  1. Check the care team in [facility] and look for a) attending/consulting TRH provider listed and b) the [facility] team listed 2. Log into  [email or web address] and use [place]'s universal password to access. If you do not have the password, please contact the hospital operator. 3. Locate the [facility] provider you are looking for under [facility] and page to a number that you can be directly reached. 4. If you still have difficulty reaching the provider, please page the [facility] (Director on Call) for the Hospitalists listed on amion for assistance.  01/27/2019, 10:45 AM

## 2019-01-28 LAB — CBC
HCT: 34.1 % — ABNORMAL LOW (ref 39.0–52.0)
Hemoglobin: 10.7 g/dL — ABNORMAL LOW (ref 13.0–17.0)
MCH: 30.4 pg (ref 26.0–34.0)
MCHC: 31.4 g/dL (ref 30.0–36.0)
MCV: 96.9 fL (ref 80.0–100.0)
Platelets: 122 10*3/uL — ABNORMAL LOW (ref 150–400)
RBC: 3.52 MIL/uL — ABNORMAL LOW (ref 4.22–5.81)
RDW: 14.2 % (ref 11.5–15.5)
WBC: 10.2 10*3/uL (ref 4.0–10.5)
nRBC: 0 % (ref 0.0–0.2)

## 2019-01-28 LAB — BASIC METABOLIC PANEL
Anion gap: 9 (ref 5–15)
BUN: 19 mg/dL (ref 8–23)
CO2: 27 mmol/L (ref 22–32)
Calcium: 8.7 mg/dL — ABNORMAL LOW (ref 8.9–10.3)
Chloride: 95 mmol/L — ABNORMAL LOW (ref 98–111)
Creatinine, Ser: 0.57 mg/dL — ABNORMAL LOW (ref 0.61–1.24)
GFR calc Af Amer: >60 mL/min (ref >60)
GFR calc non Af Amer: >60 mL/min (ref >60)
Glucose, Bld: 192 mg/dL — ABNORMAL HIGH (ref 70–99)
Potassium: 5.3 mmol/L — ABNORMAL HIGH (ref 3.5–5.1)
Sodium: 131 mmol/L — ABNORMAL LOW (ref 135–145)

## 2019-01-28 LAB — GLUCOSE, CAPILLARY
Glucose-Capillary: 150 mg/dL — ABNORMAL HIGH (ref 70–99)
Glucose-Capillary: 170 mg/dL — ABNORMAL HIGH (ref 70–99)
Glucose-Capillary: 172 mg/dL — ABNORMAL HIGH (ref 70–99)
Glucose-Capillary: 159 mg/dL — ABNORMAL HIGH (ref 70–99)

## 2019-01-28 NOTE — Progress Notes (Signed)
Patient has refused to sit in his chair through out the day. Education has been given continuously, However pt continues to refuse. States that he can't get up.  Nurse tech reports to me that she has talk to him to sit in his chair. But pt states to her that he does not want to today.  Pt states that he has been getting up every day since he is been in the hospital and does not want to today.   Will try again before dinner arrives to get him up in the chair.

## 2019-01-28 NOTE — Progress Notes (Signed)
Explained eyedrops to pt, Pt says he doesn't take two eyedrops at home and refuses.

## 2019-01-28 NOTE — Progress Notes (Signed)
Patient is sitting in his chair to eat his dinner.

## 2019-01-28 NOTE — Plan of Care (Signed)
  Problem: Education: Goal: Knowledge of General Education information will improve Description: Including pain rating scale, medication(s)/side effects and non-pharmacologic comfort measures Outcome: Progressing   Problem: Health Behavior/Discharge Planning: Goal: Ability to manage health-related needs will improve Outcome: Progressing   Problem: Activity: Goal: Risk for activity intolerance will decrease Outcome: Progressing   

## 2019-01-28 NOTE — Progress Notes (Signed)
Progress Note    Nathan Cunningham  [id number] DOB: [birth year]  DOA: 01/25/2019 PCP: Lucille Passy, MD    Brief Narrative:     Medical records reviewed and are as summarized below:  Nathan Cunningham is an 85 y.o. male with history of hypertension, hyperlipidemia, diabetes mellitus type 2, NIDDM was just discharged on 01/22/2019 after being treated for acute hypoxic respiratory failure secondary to COPD exacerbation, acute on chronic diastolic CHF (EF 60 to 78% )and UTI.  Patient reports that he has not been able to ambulate and push his walker since his discharge from the hospital due to weakness.   Assessment/Plan:   Principal Problem:   Hypotension Active Problems:   ATRIAL FLUTTER   BPH (benign prostatic hyperplasia)   Type 2 diabetes mellitus with diabetic neuropathy, unspecified (HCC)   Acute respiratory failure with hypoxia (HCC)   Lower leg edema   Acute CHF (congestive heart failure) (HCC)  Gross hematuria -resolved -recent CT Scan questioning bladder thickening -outpatient urology follow up  Hypotension with history of essential hypertension  -BP currently stable. During the previous admission, patient was noticed to be hypotensive and was attributed to overdiuresis and hypoalbuminemia -TED hose  Hyponatremia  - BNP low, CXR clear, no pulmonary edema, peripheral edema likely due to chronic diastolic CHF and hypoalbuminemia  - trending up  Hyperkalemia -trending down -not on any replacement Kdur  Acute kidney injury:  - Baseline Cr 0.95 - Likely due to poor oral intake, dehydration, ARB - resolved  Chronic diastolic CHF - BNP 93.8, has chronic peripheral edema however chest x-ray shows no acute cardiopulmonary abnormality, no pneumonia or pulmonary edema -2D echo done on 01/16/2019 showed EF 60-65%,impaired relaxation -Place TED hoses, will not be able to give IV or oral Lasix due to borderline BP. Once BP and Na stable, may be able to add low  dose lasix at discharge.      Type 2 diabetes mellitus with diabetic neuropathy, unspecified ([place]) - resume [place] - place on sensitive SSI  Chronic respiratory failure with hypoxia ([place]) secondary to COPD - On O2 2L at home  Generalized weakness -During the previous admission, PT had recommended skilled nursing facility, patient had refused, currently has home health PT. Patient reports difficulty in ambulation due to weakness since discharge. No focal weakness.  -PT eval-- needs SNF as family not able to manage at home- patient agreeable  obesity Body mass index is 28.82 kg/m.   Family Communication/Anticipated D/C date and plan/Code Status   DVT prophylaxis: scd Code Status: Full Code.  Family Communication:  Disposition Plan: SNF Monday if medically stable   Medical Consultants:    None.     Subjective:   No overnight events  Objective:    Vitals:   01/27/19 1933 01/28/19 0333 01/28/19 0345 01/28/19 1132  BP: (!) 126/53 (!) 132/51  127/63  Pulse: 73 77  68  Resp: 15 18  18   Temp: 98.2 F (36.8 C) 97.9 F (36.6 C)  98.3 F (36.8 C)  TempSrc: Oral Oral  Oral  SpO2: 93% 96%  98%  Weight:   83.5 kg   Height:        Intake/Output Summary (Last 24 hours) at 01/28/2019 1223 Last data filed at 01/28/2019 0900 Gross per 24 hour  Intake 1080 ml  Output 1050 ml  Net 30 ml   Filed Weights   01/26/19 0336 01/27/19 0508 01/28/19 0345  Weight: 89.1 kg 89.4 kg 83.5 kg  Exam: In bed, NAD rrr Pleasant/cooperative +BS, soft  Data Reviewed:   I have personally reviewed following labs and imaging studies:  Labs: Labs show the following:   Basic Metabolic Panel: Recent Labs  Lab 01/22/19 0839 01/25/19 1541 01/25/19 2108 01/26/19 0619 01/27/19 0431 01/28/19 0839  NA 134* 128*  --  129* 132* 131*  K 4.8 5.0  --  5.0 5.5* 5.3*  CL 92* 90*  --  92* 95* 95*  CO2 32 29  --  29 31 27   GLUCOSE 212* 119*  --  149* 175* 192*  BUN 36* 52*  --  46*  30* 19  CREATININE 0.95 1.54* 1.36* 1.02 0.74 0.57*  CALCIUM 8.7* 8.7*  --  8.5* 8.9 8.7*  MG 2.3 2.3  --   --   --   --    GFR Estimated Creatinine Clearance: 62.2 mL/min (A) (by C-G formula based on SCr of 0.57 mg/dL (L)). Liver Function Tests: Recent Labs  Lab 01/25/19 1541  AST 10*  ALT 17  ALKPHOS 37*  BILITOT 0.9  PROT 6.0*  ALBUMIN 3.1*   No results for input(s): LIPASE, AMYLASE in the last 168 hours. No results for input(s): AMMONIA in the last 168 hours. Coagulation profile No results for input(s): INR, PROTIME in the last 168 hours.  CBC: Recent Labs  Lab 01/25/19 1541 01/25/19 2108 01/26/19 0619 01/27/19 0431 01/28/19 0839  WBC 14.4* 13.4* 12.5* 11.2* 10.2  NEUTROABS 10.7*  --   --   --   --   HGB 11.1* 11.4* 10.7* 11.1* 10.7*  HCT 34.9* 35.0* 33.2* 35.2* 34.1*  MCV 94.3 93.6 93.3 93.4 96.9  PLT 135* 134* 123* 122* 122*   Cardiac Enzymes: Recent Labs  Lab 01/25/19 1541  TROPONINI <0.03   BNP (last 3 results) No results for input(s): PROBNP in the last 8760 hours. CBG: Recent Labs  Lab 01/27/19 1131 01/27/19 1653 01/27/19 2056 01/28/19 0605 01/28/19 1129  GLUCAP 162* 136* 203* 150* 170*   D-Dimer: No results for input(s): DDIMER in the last 72 hours. Hgb A1c: Recent Labs    01/25/19 2108  HGBA1C 7.9*   Lipid Profile: No results for input(s): CHOL, HDL, LDLCALC, TRIG, CHOLHDL, LDLDIRECT in the last 72 hours. Thyroid function studies: Recent Labs    01/25/19 2108  TSH 4.061   Anemia work up: No results for input(s): VITAMINB12, FOLATE, FERRITIN, TIBC, IRON, RETICCTPCT in the last 72 hours. Sepsis Labs: Recent Labs  Lab 01/25/19 2108 01/26/19 0619 01/27/19 0431 01/28/19 0839  WBC 13.4* 12.5* 11.2* 10.2    Microbiology Recent Results (from the past 240 hour(s))  Novel Coronavirus,NAA,(SEND-OUT TO REF LAB - TAT 24-48 hrs); Hosp Order     Status: None   Collection Time: 01/25/19  4:48 PM   Specimen: Nasopharyngeal Swab;  Respiratory  Result Value Ref Range Status   SARS-CoV-2, NAA NOT DETECTED NOT DETECTED Final    Comment: (NOTE) This test was developed and its performance characteristics determined by [employer]. This test has not been FDA cleared or approved. This test has been authorized by FDA under an Emergency Use Authorization (EUA). This test is only authorized for the duration of time the declaration that circumstances exist justifying the authorization of the emergency use of in vitro diagnostic tests for detection of SARS-CoV-2 virus and/or diagnosis of COVID-19 infection under section 564(b)(1) of the Act, 21 U.S.C. 237SEG-3(T)(5), unless the authorization is terminated or revoked sooner. When diagnostic testing is negative, the possibility of  a false negative result should be considered in the context of a patient's recent exposures and the presence of clinical signs and symptoms consistent with COVID-19. An individual without symptoms of COVID-19 and who is not shedding SARS-CoV-2 virus would expect to have a negative (not detected) result in this assay. Performed  At: [facility] [address] [place], [place] [id number] Rush Farmer MD DQ:[id number]    [place]  Final    Comment: Performed at [facility], [place] [address] [place], [place] [address]  Urine culture     Status: None   Collection Time: 01/25/19  9:19 PM   Specimen: Urine, Random  Result Value Ref Range Status   Specimen Description URINE, RANDOM  Final   Special Requests NONE  Final   Culture   Final    NO GROWTH Performed at [facility], [place] [address], [place], [place] [address]    Report Status 01/27/2019 FINAL  Final    Procedures and diagnostic studies:  No results found.  Medications:   . aspirin  162.5 mg Oral Daily  . brimonidine  1 drop Both Eyes BID   And  . timolol  1 drop Both Eyes BID  . folic acid  1 mg Oral Daily  . insulin aspart   0-5 Units Subcutaneous QHS  . insulin aspart  0-9 Units Subcutaneous TID WC  . linagliptin  5 mg Oral Daily  . multivitamin with minerals  1 tablet Oral Daily  . pregabalin  150 mg Oral QPM  . thiamine  100 mg Oral Daily   Continuous Infusions:   LOS: 3 days   Geradine Girt  [facility]   How to contact the [facility] Attending or Consulting provider [place] or covering provider during after hours [place], for this patient?  1. Check the care team in [facility] and look for a) attending/consulting TRH provider listed and b) the [facility] team listed 2. Log into [email or web address] and use [place]'s universal password to access. If you do not have the password, please contact the hospital operator. 3. Locate the [facility] provider you are looking for under [facility] and page to a number that you can be directly reached. 4. If you still have difficulty reaching the provider, please page the [facility] (Director on Call) for the Hospitalists listed on amion for assistance.  01/28/2019, 12:23 PM

## 2019-01-29 ENCOUNTER — Telehealth: Payer: Self-pay

## 2019-01-29 DIAGNOSIS — R319 Hematuria, unspecified: Secondary | ICD-10-CM

## 2019-01-29 LAB — BASIC METABOLIC PANEL
Anion gap: 6 (ref 5–15)
BUN: 19 mg/dL (ref 8–23)
CO2: 32 mmol/L (ref 22–32)
Calcium: 8.7 mg/dL — ABNORMAL LOW (ref 8.9–10.3)
Chloride: 94 mmol/L — ABNORMAL LOW (ref 98–111)
Creatinine, Ser: 0.63 mg/dL (ref 0.61–1.24)
GFR calc Af Amer: >60 mL/min (ref >60)
GFR calc non Af Amer: >60 mL/min (ref >60)
Glucose, Bld: 172 mg/dL — ABNORMAL HIGH (ref 70–99)
Potassium: 5.5 mmol/L — ABNORMAL HIGH (ref 3.5–5.1)
Sodium: 132 mmol/L — ABNORMAL LOW (ref 135–145)

## 2019-01-29 LAB — CBC
HCT: 33.9 % — ABNORMAL LOW (ref 39.0–52.0)
Hemoglobin: 10.5 g/dL — ABNORMAL LOW (ref 13.0–17.0)
MCH: 29.8 pg (ref 26.0–34.0)
MCHC: 31 g/dL (ref 30.0–36.0)
MCV: 96.3 fL (ref 80.0–100.0)
Platelets: 131 10*3/uL — ABNORMAL LOW (ref 150–400)
RBC: 3.52 MIL/uL — ABNORMAL LOW (ref 4.22–5.81)
RDW: 13.9 % (ref 11.5–15.5)
WBC: 11.9 10*3/uL — ABNORMAL HIGH (ref 4.0–10.5)
nRBC: 0 % (ref 0.0–0.2)

## 2019-01-29 LAB — GLUCOSE, CAPILLARY
Glucose-Capillary: 147 mg/dL — ABNORMAL HIGH (ref 70–99)
Glucose-Capillary: 190 mg/dL — ABNORMAL HIGH (ref 70–99)
Glucose-Capillary: 154 mg/dL — ABNORMAL HIGH (ref 70–99)
Glucose-Capillary: 150 mg/dL — ABNORMAL HIGH (ref 70–99)

## 2019-01-29 MED ORDER — SODIUM ZIRCONIUM CYCLOSILICATE 5 G PO PACK
5.0000 g | PACK | Freq: Every day | ORAL | Status: DC
  Administered 2019-01-29 – 2019-01-30 (×2): 5 g via ORAL
  Filled 2019-01-29 (×2): qty 1

## 2019-01-29 NOTE — Telephone Encounter (Signed)
Dr. Armanda Magic Copied from [place] [phone]. Topic: General - Inquiry >> Jan 29, 2019  3:05 PM Scherrie Gerlach wrote: Reason for CRM: [name] called to inform Dr [name] pt wil be going to [facility] for rehab.

## 2019-01-29 NOTE — Care Management Important Message (Signed)
Important Message  Patient Details  Name: Nathan Cunningham MRN: [id number] Date of Birth: [birth year]   Medicare Important Message Given:  Yes     [name] 01/29/2019, 12:46 PM

## 2019-01-29 NOTE — Telephone Encounter (Signed)
Yes okay to give verbal order as requested. 

## 2019-01-29 NOTE — Discharge Summary (Addendum)
Physician Discharge Summary  Nathan Cunningham GEX:[id number] DOB: [birth year] DOA: 01/25/2019  PCP: Lucille Passy, MD  Admit date: 01/25/2019 Discharge date: 01/30/2019  Admitted From: home Discharge disposition: SNF   Recommendations for Outpatient Follow-Up:   TED hose Outpatient urology follow up for hematuria and thickened bladder BMP 1 week re: K Holding ARB due to hyperkalemia  Discharge Diagnosis:   Principal Problem:   Hypotension Active Problems:   ATRIAL FLUTTER   BPH (benign prostatic hyperplasia)   Type 2 diabetes mellitus with diabetic neuropathy, unspecified (HCC)   Lower leg edema Chronic diastolic CHF (congestive heart failure) (HCC) Chronic respiratory failure with hypoxia - not acute    Discharge Condition: Improved.  Diet recommendation: Low sodium, heart healthy.  Carbohydrate-modified  Wound care: None.  Code status: Full.   History of Present Illness:  Nathan Cunningham is an 85 y.o. male with history of hypertension, hyperlipidemia, diabetes mellitus type 2, NIDDM was just discharged on 01/22/2019 after being treated for acute hypoxic respiratory failure secondary to COPD exacerbation, acute on chronic diastolic CHF (EF 60 to 01% )and UTI.  Patient reports that he has not been able to ambulate and push his walker since his discharge from the hospital due to weakness.    Hospital Course by Problem:   Gross hematuria -resolved -recent CT Scan questioning bladder thickening -outpatient urology follow up   Hypotension with history of essential hypertension  -BP currently stable. During the previous admission, patient was noticed to be hypotensive and was attributed to overdiuresis and hypoalbuminemia -TED hose -allow for permissive HTN when laying down   Hyponatremia  - BNP low, CXR clear, no pulmonary edema, peripheral edema likely due to chronic diastolic CHF and hypoalbuminemia  - trending up   Hyperkalemia -lokelma x 1 dose  -not on any replacement Kdur   Acute kidney injury:  - Baseline Cr 0.95 - Likely due to poor oral intake, dehydration, ARB - resolved   Chronic diastolic CHF - BNP 02.7, has chronic peripheral edema however chest x-ray shows no acute cardiopulmonary abnormality, no pneumonia or pulmonary edema -2D echo done on 01/16/2019 showed EF 60-65%,impaired relaxation -Place TED hoses, will not be able to give IV or oral Lasix due to borderline BP. Once BP and Na stable, may be able to add low dose lasix at discharge.      Type 2 diabetes mellitus with diabetic neuropathy, unspecified ([place]) - resume [place] - place on sensitive SSI   Chronic respiratory failure with hypoxia ([place]) secondary to COPD - On O2 2L at home   Generalized weakness -During the previous admission, PT had recommended skilled nursing facility, patient had refused, currently has home health PT. Patient reports difficulty in ambulation due to weakness since discharge. No focal weakness.  -PT eval-- needs SNF as family not able to manage at home- patient agreeable   obesity Body mass index is 28.82 kg/m.    Medical Consultants:      Discharge Exam:   Vitals:   01/29/19 1937 01/30/19 0505  BP: 139/68 107/64  Pulse: 80 94  Resp: 18 18  Temp: 98.5 F (36.9 C) 98.6 F (37 C)  SpO2: 91% 90%   Vitals:   01/29/19 1229 01/29/19 1622 01/29/19 1937 01/30/19 0505  BP: (!) 122/48 (!) 134/53 139/68 107/64  Pulse: 81 78 80 94  Resp: 17 15 18 18   Temp: 98.2 F (36.8 C) 98.1 F (36.7 C) 98.5 F (36.9 C) 98.6 F (37 C)  TempSrc: Oral Oral Oral Oral  SpO2: 90% 93% 91% 90%  Weight:      Height:        General exam: Appears calm and comfortable.    The results of significant diagnostics from this hospitalization (including imaging, microbiology, ancillary and laboratory) are listed below for reference.     Procedures and Diagnostic Studies:   Dg Chest Portable 1 View  Result Date: 01/25/2019 CLINICAL DATA:   Hypotension, weakness, pitting edema. Recent hospitalization for respiratory failure. History of CHF, hypertension, diabetes. EXAM: PORTABLE CHEST 1 VIEW COMPARISON:  Chest x-rays dated 01/15/2019 and 04/20/2016. FINDINGS: Stable cardiomegaly. Coarse lung markings bilaterally suggesting some degree of chronic interstitial lung disease. No confluent opacity to suggest a superimposed consolidating pneumonia. Chronic blunting of the LEFT costophrenic angle. Probable chronic atelectasis/scarring at the RIGHT lung base. No evidence of pleural effusion or pneumothorax. Osseous structures about the chest are unremarkable. IMPRESSION: 1. No evidence of acute cardiopulmonary abnormality. No evidence of pneumonia or pulmonary edema. 2. Stable cardiomegaly. Probable chronic interstitial lung disease. Electronically Signed   By: Franki Cabot M.D.   On: 01/25/2019 16:20     Labs:   Basic Metabolic Panel: Recent Labs  Lab 01/25/19 1541 01/25/19 2108 01/26/19 0619 01/27/19 0431 01/28/19 0839 01/29/19 0532  NA 128*  --  129* 132* 131* 132*  K 5.0  --  5.0 5.5* 5.3* 5.5*  CL 90*  --  92* 95* 95* 94*  CO2 29  --  29 31 27  32  GLUCOSE 119*  --  149* 175* 192* 172*  BUN 52*  --  46* 30* 19 19  CREATININE 1.54* 1.36* 1.02 0.74 0.57* 0.63  CALCIUM 8.7*  --  8.5* 8.9 8.7* 8.7*  MG 2.3  --   --   --   --   --    GFR Estimated Creatinine Clearance: 64.6 mL/min (by C-G formula based on SCr of 0.63 mg/dL). Liver Function Tests: Recent Labs  Lab 01/25/19 1541  AST 10*  ALT 17  ALKPHOS 37*  BILITOT 0.9  PROT 6.0*  ALBUMIN 3.1*   No results for input(s): LIPASE, AMYLASE in the last 168 hours. No results for input(s): AMMONIA in the last 168 hours. Coagulation profile No results for input(s): INR, PROTIME in the last 168 hours.  CBC: Recent Labs  Lab 01/25/19 1541 01/25/19 2108 01/26/19 0619 01/27/19 0431 01/28/19 0839 01/29/19 0532  WBC 14.4* 13.4* 12.5* 11.2* 10.2 11.9*  NEUTROABS 10.7*  --    --   --   --   --   HGB 11.1* 11.4* 10.7* 11.1* 10.7* 10.5*  HCT 34.9* 35.0* 33.2* 35.2* 34.1* 33.9*  MCV 94.3 93.6 93.3 93.4 96.9 96.3  PLT 135* 134* 123* 122* 122* 131*   Cardiac Enzymes: Recent Labs  Lab 01/25/19 1541  TROPONINI <0.03   BNP: Invalid input(s): POCBNP CBG: Recent Labs  Lab 01/29/19 0628 01/29/19 1223 01/29/19 1621 01/29/19 2127 01/30/19 0621  GLUCAP 147* 190* 154* 150* 237*   D-Dimer No results for input(s): DDIMER in the last 72 hours. Hgb A1c No results for input(s): HGBA1C in the last 72 hours. Lipid Profile No results for input(s): CHOL, HDL, LDLCALC, TRIG, CHOLHDL, LDLDIRECT in the last 72 hours. Thyroid function studies No results for input(s): TSH, T4TOTAL, T3FREE, THYROIDAB in the last 72 hours.  Invalid input(s): FREET3 Anemia work up No results for input(s): VITAMINB12, FOLATE, FERRITIN, TIBC, IRON, RETICCTPCT in the last 72 hours. Microbiology Recent Results (from the past  240 hour(s))  Novel Coronavirus,NAA,(SEND-OUT TO REF LAB - TAT 24-48 hrs); Hosp Order     Status: None   Collection Time: 01/25/19  4:48 PM   Specimen: Nasopharyngeal Swab; Respiratory  Result Value Ref Range Status   SARS-CoV-2, NAA NOT DETECTED NOT DETECTED Final    Comment: (NOTE) This test was developed and its performance characteristics determined by [employer]. This test has not been FDA cleared or approved. This test has been authorized by FDA under an Emergency Use Authorization (EUA). This test is only authorized for the duration of time the declaration that circumstances exist justifying the authorization of the emergency use of in vitro diagnostic tests for detection of SARS-CoV-2 virus and/or diagnosis of COVID-19 infection under section 564(b)(1) of the Act, 21 U.S.C. 893YBO-1(B)(5), unless the authorization is terminated or revoked sooner. When diagnostic testing is negative, the possibility of a false negative result should be considered  in the context of a patient's recent exposures and the presence of clinical signs and symptoms consistent with COVID-19. An individual without symptoms of COVID-19 and who is not shedding SARS-CoV-2 virus would expect to have a negative (not detected) result in this assay. Performed  At: [facility] [address] [place], [place] [id number] Rush Farmer MD OE:[id number]    [place]  Final    Comment: Performed at [facility], [place] [address], [place], [place] [address]  Urine culture     Status: None   Collection Time: 01/25/19  9:19 PM   Specimen: Urine, Random  Result Value Ref Range Status   Specimen Description URINE, RANDOM  Final   Special Requests NONE  Final   Culture   Final    NO GROWTH Performed at [facility], [place] [address], [place], [place] [address]    Report Status 01/27/2019 FINAL  Final  Novel Coronavirus, NAA (hospital order; send-out to ref lab)     Status: None   Collection Time: 01/28/19  3:31 PM   Specimen: Nasopharyngeal Swab; Respiratory  Result Value Ref Range Status   SARS-CoV-2, NAA NOT DETECTED NOT DETECTED Final    Comment: (NOTE) Testing was performed using the cobas(R) SARS-CoV-2 test. This test was developed and its performance characteristics determined by [employer]. This test has not been FDA cleared or approved. This test has been authorized by FDA under an Emergency Use Authorization (EUA). This test is only authorized for the duration of time the declaration that circumstances exist justifying the authorization of the emergency use of in vitro diagnostic tests for detection of SARS-CoV-2 virus and/or diagnosis of COVID-19 infection under section 564(b)(1) of the Act, 21 U.S.C. 950DTO-6(Z)(1), unless the authorization is terminated or revoked sooner. When diagnostic testing is negative, the possibility of a false negative result should be considered in the context of a patient's  recent exposures and the presence of clinical signs and symptoms consistent with COVID-19. An individual without symptoms of COVID-19 and who is not shedding SARS-CoV-2 virus would expect to have  a negative (not detected) result in this assay. Performed At: [facility] [address] [place], [place] [id number] Rush Farmer MD JA:[id number]    [place]  Final    Comment: Performed at [facility], [place] [address], [place], [place] [address]     Discharge Instructions:   Discharge Instructions     Diet - low sodium heart healthy   Complete by: As directed    Diet Carb Modified  Complete by: As directed    Discharge instructions   Complete by: As directed    TED hose   Increase activity slowly   Complete by: As directed       Allergies as of 01/30/2019       Reactions   Tape Other (See Comments)   SKIN IS VERY THIN AND WILL TEAR AND BRUISE VERY EASILY!!        Medication List     STOP taking these medications    dextromethorphan-guaiFENesin 30-600 MG 12hr tablet Commonly known as: MUCINEX DM   irbesartan 150 MG tablet Commonly known as: AVAPRO   Kombiglyze XR 12-998 MG Tb24 Generic drug: Saxagliptin-Metformin   telmisartan 40 MG tablet Commonly known as: MICARDIS       TAKE these medications    aspirin 325 MG tablet Take 162.5 mg by mouth daily. Notes to patient: 6/23   Combigan 0.2-0.5 % ophthalmic solution Generic drug: brimonidine-timolol Place 1 drop into both eyes every 12 (twelve) hours.   cyanocobalamin 1000 MCG/ML injection Commonly known as: (VITAMIN B-12) Inject 1046mcg every 60days IM (every other month) What changed:  how much to take how to take this when to take this additional instructions   diphenhydramine-acetaminophen 25-500 MG Tabs tablet Commonly known as: TYLENOL PM Take 1 tablet by mouth at bedtime. Notes to patient: 3/81   folic acid 1 MG tablet Commonly known as: FOLVITE  Take 1 tablet (1 mg total) by mouth daily. Notes to patient: 6/23   glucose blood test strip Commonly known as: ONE TOUCH ULTRA TEST CHECK BLOOD SUGAR ONCE DAILY AND AS DIRECTED.   Januvia 100 MG tablet Generic drug: sitaGLIPtin TAKE 1 TABLET BY MOUTH DAILY What changed:  how much to take when to take this   OXYGEN Inhale 2-3 L into the lungs continuous.   pregabalin 75 MG capsule Commonly known as: LYRICA TAKE ONE CAPSULE BY MOUTH TWICE DAILY. GENERIC EQUIVALENT FOR LYRICA. What changed: See the new instructions. Notes to patient: 6/23   RA Chromium Picolinate 400 MCG Tabs Generic drug: Chromium Picolinate Take 400 mcg by mouth daily.   thiamine 100 MG tablet Take 1 tablet (100 mg total) by mouth daily. Notes to patient: 6/23   Vision Formula/Lutein Tabs Take 1 tablet by mouth 2 (two) times a day. Notes to patient: 6/22   Centrum Silver 50+Men Tabs Take 1 tablet by mouth daily with breakfast. Notes to patient: 6/23       Follow-up Information     Lucille Passy, MD. Go on 01/31/2019.   Specialty: Family Medicine Why: @11 :20am -needs repeat BMP Contact information: [place] [address] [phone]             Time coordinating discharge: 35 min  Signed:  Geradine Girt DO  [facility] 01/30/2019, 8:58 AM

## 2019-01-29 NOTE — Telephone Encounter (Signed)
Dr. Aron FYI and okay to give verbal? 

## 2019-01-29 NOTE — Telephone Encounter (Signed)
Copied from [place] [id number]. Topic: General - Other >> Jan 26, 2019  4:28 PM Rainey Pines A wrote: Elenore Rota from home health wanted to inform provider that Patient went to emergency room and was admitted . Patient had low blood pressure on arrival and Elenore Rota was not able to complete evaluation. He is requesting OT 1w2 and reevaluate next week if patient comes home from hospital  and will also place patient in skilled nursing.  Patient did fall out of a chair on Monday and EMS was called. On Wednesday night he had pain on his bottom from sitting on chair and wasn't able to get out the chair so he called EMS again. Elenore Rota will work with patient on exercise.

## 2019-01-29 NOTE — Telephone Encounter (Signed)
Left detailed message on Don VM giving verbal ok for pt. Advised him if he had any questions to give [place] call back.

## 2019-01-29 NOTE — TOC Progression Note (Signed)
Transition of Care [facility]) - Progression Note    Patient Details  Name: Nathan Cunningham MRN: [id number] Date of Birth: [birth year]  Transition of Care [facility]) CM/SW [place], LCSW Phone Number: 01/29/2019, 11:12 AM  Clinical Narrative: [name] has received insurance authorization. Patient can discharge once COVID results from yesterday are in.  Expected Discharge Plan: [place] Barriers to Discharge: Continued Medical Work up, Ship broker  Expected Discharge Plan and Services Expected Discharge Plan: [place] In-house Referral: Clinical Social Work   Post Acute Care Choice: [place] Living arrangements for the past 2 months: Single Family Home                           HH Arranged: NA           Social Determinants of Health (SDOH) Interventions    Readmission Risk Interventions No flowsheet data found.

## 2019-01-29 NOTE — Plan of Care (Signed)
  Problem: Education: Goal: Knowledge of General Education information will improve Description Including pain rating scale, medication(s)/side effects and non-pharmacologic comfort measures Outcome: Adequate for Discharge   Problem: Health Behavior/Discharge Planning: Goal: Ability to manage health-related needs will improve Outcome: Adequate for Discharge   

## 2019-01-29 NOTE — Consult Note (Signed)
   [facility] CM Inpatient Consult   01/29/2019  Nathan Cunningham 03-01-1925 [id number]   Patient screened for unplanned readmission within the past 7 daysto check if potential [place] [facility] services are needed.  Patient with [facility].  Review of patient's medical record reveals patient is currently for skilled nursing facility stay per inpatient [facility] social worker's progress notes.   Chart reviewed for admission and MD history and physical reveals:  Patient is a 85 year old male with history of hypertension, hyperlipidemia, diabetes mellitus type 2, NIDDM was just discharged on 01/22/2019 after being treated for acute hypoxic respiratory failure secondary to COPD exacerbation, acute on chronic diastolic CHF (EF 60 to 43% )and UTI.  Patient reports that he has not been able to ambulate and push his walker since his discharge from the hospital due to weakness.  Primary Care Provider is Arnette Norris, MD, Claudie Fisherman, this office is listed to provide the transition of follow up.  Plan:  Patient is to go to skilled nursing facility at [facility] once COVID results are verified.  No current post hospital follow up needs if patient goes to SNF.   For questions contact:   Natividad Brood, RN BSN [facility] Liaison  [phone] business mobile phone Toll free office [phone]  Fax number: [phone] [email or web address]

## 2019-01-30 LAB — GLUCOSE, CAPILLARY: Glucose-Capillary: 237 mg/dL — ABNORMAL HIGH (ref 70–99)

## 2019-01-30 LAB — NOVEL CORONAVIRUS, NAA (HOSP ORDER, SEND-OUT TO REF LAB; TAT 18-24 HRS): SARS-CoV-2, NAA: NOT DETECTED

## 2019-01-30 NOTE — Progress Notes (Signed)
[facility] here to transport pt to [facility]. All pt belongings sent.

## 2019-01-30 NOTE — Progress Notes (Signed)
Patient's discharge was held overnight while the results of his most recent COVID-19 test was pending.  Results back and are negative.  Plan for discharge to rehab today. Eulogio Bear DO

## 2019-01-30 NOTE — Plan of Care (Signed)
  Problem: Clinical Measurements: Goal: Ability to maintain clinical measurements within normal limits will improve Outcome: Progressing Goal: Will remain free from infection Outcome: Progressing Goal: Diagnostic test results will improve Outcome: Progressing Goal: Cardiovascular complication will be avoided Outcome: Progressing   Problem: Activity: Goal: Risk for activity intolerance will decrease 01/30/2019 0236 by Rexford Maus, RN Outcome: Progressing 01/30/2019 0233 by Rexford Maus, RN Outcome: Progressing   Problem: Nutrition: Goal: Adequate nutrition will be maintained Outcome: Progressing   Problem: Coping: Goal: Level of anxiety will decrease Outcome: Progressing   Problem: Elimination: Goal: Will not experience complications related to bowel motility Outcome: Progressing

## 2019-01-30 NOTE — Plan of Care (Signed)
  Problem: Clinical Measurements: Goal: Ability to maintain clinical measurements within normal limits will improve Outcome: Progressing Goal: Will remain free from infection Outcome: Progressing Goal: Diagnostic test results will improve Outcome: Progressing Goal: Cardiovascular complication will be avoided Outcome: Progressing   Problem: Activity: Goal: Risk for activity intolerance will decrease Outcome: Progressing   

## 2019-01-30 NOTE — Progress Notes (Signed)
Pt. For d/c to [facility]. Report given to [facility].

## 2019-01-30 NOTE — Progress Notes (Signed)
Physical Therapy Treatment Patient Details Name: Nathan Cunningham MRN: [id number] DOB: [birth year] Today's Date: 01/30/2019    History of Present Illness Pt is a 85 yo male admitted secondary to hypotension. Pt with recent admission secondary to respiratory failure. PMH includes DM, a flutter, HTN, dCHF, and OSA on CPAP.     PT Comments    Pt supine on arrival and reports extreme deconditioning as of late with inability to stand or walk. Pt with improved standing tolerance and balance today with use of Stedy with cues for posture and positioning. Pt educated for HEp and encouraged to be OOB daily with nursing educated for [place] use. Will continue to follow.  Pt on 4L throughout with SpO2 91-92%    Follow Up Recommendations  SNF;Supervision/Assistance - 24 hour     Equipment Recommendations  Wheelchair cushion (measurements PT);Wheelchair (measurements PT)    Recommendations for Other Services       Precautions / Restrictions Precautions Precautions: Fall Restrictions Weight Bearing Restrictions: No    Mobility  Bed Mobility Overal bed mobility: Needs Assistance Bed Mobility: Supine to Sit     Supine to sit: Max assist;+2 for physical assistance;HOB elevated     General bed mobility comments: HOb 20 degrees with cues for pt to bend knee and reach with RUE for left rail but unable and required max +2 to pivot to eOB to left. [place] for sitting balance  Transfers Overall transfer level: Needs assistance   Transfers: Sit to/from Stand Sit to Stand: Mod assist;+2 physical assistance         General transfer comment: mod +2 physical assist to rise from elevated surface with knee blocked. utilized stedy x 3 trials to stand from bed and from stedy with pt able to stand 30, 20, 40 sec respectively with min assist for standing balance with cues for trunk and hip extension with sacral cues. Pt pivoted bed to chair with stedy  Ambulation/Gait             General  Gait Details: not currently able   Stairs             Wheelchair Mobility    Modified Rankin (Stroke Patients Only)       Balance Overall balance assessment: Needs assistance   Sitting balance-Leahy Scale: Fair     Standing balance support: Bilateral upper extremity supported Standing balance-Leahy Scale: Poor                              Cognition Arousal/Alertness: Awake/alert Behavior During Therapy: WFL for tasks assessed/performed Overall Cognitive Status: Impaired/Different from baseline Area of Impairment: Following commands;Problem solving                       Following Commands: Follows one step commands with increased time     Problem Solving: Slow processing        Exercises General Exercises - Lower Extremity Long Arc Quad: AAROM;15 reps;Both;Seated Hip Flexion/Marching: AAROM;15 reps;Both;Seated    General Comments        Pertinent Vitals/Pain Pain Assessment: No/denies pain    Home Living                      Prior Function            PT Goals (current goals can now be found in the care plan section) Progress towards PT goals: Progressing toward goals  Frequency    Min 2X/week      PT Plan Current plan remains appropriate    Co-evaluation              AM-PAC PT "6 Clicks" Mobility   Outcome Measure    Help needed moving from lying on your back to sitting on the side of a flat bed without using bedrails?: Total Help needed moving to and from a bed to a chair (including a wheelchair)?: Total Help needed standing up from a chair using your arms (e.g., wheelchair or bedside chair)?: A Lot Help needed to walk in hospital room?: Total Help needed climbing 3-5 steps with a railing? : Total 6 Click Score: 6    End of Session Equipment Utilized During Treatment: Gait belt;Oxygen Activity Tolerance: Patient tolerated treatment well Patient left: in chair;with call bell/phone within  reach;with chair alarm set(chair alarm present but broken with cord pulled out of pad and RN aware) Nurse Communication: Mobility status;Other (comment)(stedy for transfers) PT Visit Diagnosis: Difficulty in walking, not elsewhere classified (R26.2);Muscle weakness (generalized) (M62.81);Unsteadiness on feet (R26.81)     Time: 7915-0569 PT Time Calculation (min) (ACUTE ONLY): 23 min  Charges:  $Therapeutic Exercise: 8-22 mins $Therapeutic Activity: 8-22 mins                     [place], PT Acute Rehabilitation Services Pager: [phone] Office: [phone]    Nathan Cunningham 01/30/2019, 12:49 PM

## 2019-01-30 NOTE — TOC Transition Note (Signed)
Transition of Care [facility]) - CM/SW Discharge Note   Patient Details  Name: Nathan Cunningham MRN: [id number] Date of Birth: 11-20-1924  Transition of Care [facility]) CM/SW Contact:  Candie Chroman, LCSW Phone Number: 01/30/2019, 11:03 AM   Clinical Narrative:  CSW facilitated patient discharge including contacting patient family and facility to confirm patient discharge plans. Clinical information faxed to facility and family agreeable with plan. CSW arranged ambulance transport via [facility] to [facility] RN has already called report.  CSW will sign off for now as social work intervention is no longer needed. Please consult [place] again if new needs arise.  Final next level of care: Skilled Nursing Facility Barriers to Discharge: Barriers Resolved   Patient Goals and CMS Choice Patient states their goals for this hospitalization and ongoing recovery are:: to get better CMS Medicare.gov Compare Post Acute Care list provided to:: Patient Represenative (must comment)(via telephone) Choice offered to / list presented to : (significant other via telephone)  Discharge Placement   Existing PASRR number confirmed : 01/26/19          Patient chooses bed at: [facility] Patient to be transferred to facility by: [place] Name of family member notified: Einar Crow Patient and family notified of of transfer: 01/30/19  Discharge Plan and Services In-house Referral: Clinical Social Work   Post Acute Care Choice: [place]: NA          Social Determinants of Health (SDOH) Interventions     Readmission Risk Interventions No flowsheet data found.

## 2019-01-31 ENCOUNTER — Inpatient Hospital Stay: Payer: Medicare Other | Admitting: Family Medicine

## 2019-02-05 DIAGNOSIS — Z79899 Other long term (current) drug therapy: Secondary | ICD-10-CM | POA: Diagnosis not present

## 2019-02-05 DIAGNOSIS — R7989 Other specified abnormal findings of blood chemistry: Secondary | ICD-10-CM | POA: Diagnosis not present

## 2019-02-08 DIAGNOSIS — R7989 Other specified abnormal findings of blood chemistry: Secondary | ICD-10-CM | POA: Diagnosis not present

## 2019-02-08 DIAGNOSIS — Z79899 Other long term (current) drug therapy: Secondary | ICD-10-CM | POA: Diagnosis not present

## 2019-02-12 DIAGNOSIS — Z79899 Other long term (current) drug therapy: Secondary | ICD-10-CM | POA: Diagnosis not present

## 2019-02-12 DIAGNOSIS — D508 Other iron deficiency anemias: Secondary | ICD-10-CM | POA: Diagnosis not present

## 2019-02-13 DIAGNOSIS — E871 Hypo-osmolality and hyponatremia: Secondary | ICD-10-CM | POA: Diagnosis not present

## 2019-02-13 DIAGNOSIS — Z79899 Other long term (current) drug therapy: Secondary | ICD-10-CM | POA: Diagnosis not present

## 2019-02-13 DIAGNOSIS — D508 Other iron deficiency anemias: Secondary | ICD-10-CM | POA: Diagnosis not present

## 2019-02-13 DIAGNOSIS — R7989 Other specified abnormal findings of blood chemistry: Secondary | ICD-10-CM | POA: Diagnosis not present

## 2019-02-15 DIAGNOSIS — Z79899 Other long term (current) drug therapy: Secondary | ICD-10-CM | POA: Diagnosis not present

## 2019-02-15 DIAGNOSIS — E871 Hypo-osmolality and hyponatremia: Secondary | ICD-10-CM | POA: Diagnosis not present

## 2019-02-16 DIAGNOSIS — E871 Hypo-osmolality and hyponatremia: Secondary | ICD-10-CM | POA: Diagnosis not present

## 2019-02-16 DIAGNOSIS — Z79899 Other long term (current) drug therapy: Secondary | ICD-10-CM | POA: Diagnosis not present

## 2019-02-20 ENCOUNTER — Other Ambulatory Visit: Payer: Self-pay

## 2019-02-20 ENCOUNTER — Non-Acute Institutional Stay: Payer: Medicare Other | Admitting: Student

## 2019-02-20 DIAGNOSIS — Z515 Encounter for palliative care: Secondary | ICD-10-CM

## 2019-02-20 NOTE — Progress Notes (Signed)
[facility] Consult Note Telephone: [phone]  Fax: [phone]  PATIENT NAME: Nathan Cunningham DOB: Mar 14, 1925 MRN: [id number]  PRIMARY CARE PROVIDER:   Lucille Passy, MD  REFERRING PROVIDER:  Lucille Passy, MD [place],  [place] [address]  RESPONSIBLE PARTY: Wife, Einar Crow    ASSESSMENT: Due to the COVID-19 crisis, this visit was done via telemedicine and it was initiated and consent by this patient and or family. [place] assisted with the visit. Palliative NP explained role of Palliative Medicine. We discussed goals of care. We discussed symptom management. We discussed Palliative versus Hospice. Mr. Dibiasio expresses wanting to ambulate again. We talked about his advanced disease processes. His skilled therapy is ending today. Spoke with patient's wife [name] via phone. She states she wants patient to be comfortable and taken care of. She acknowledges his recent decline. She states she is unable to take care of patient at home due to physical declines as well as space in the home. She is made aware that patient does meet criteria for Hospice due to recent declines. We discussed services that hospice provides. She is in agreement with Hospice evaluation. Hospice medical director consulted as well and is in agreement. Patient and wife are encouraged to call with questions.     RECOMMENDATIONS and PLAN:  1. Code status: DNR, MOST form on file at facility. 2. Medical goals of therapy: Patient being referred to Hospice due to diagnoses of Heart Failure. 3. Symptom management: Dyspnea: continue oxygen via nasal canula at 2 liters per minute continuously, lorazepam 0.5mg  every 6 hours prn. Edema: continue to elevate legs, leg wraps daily, elevate arms on pillows. Anxiety: continue lorazepam 0.5mg  every 6 hours prn.  I spent 45 minutes providing this consultation,  from 2:30pm to 3:15pm. More than 50% of the time  in this consultation was spent coordinating communication.   HISTORY OF PRESENT ILLNESS:  Nathan Cunningham is a 85 y.o.  male with multiple medical problems including COPD, CHF, sleep apnea BPH, chronic respiratory failure, atrial flutter, T2DM with retinopathy, peripheral neuropathy, protein calorie malnutrition, Vitamin B deficiency. Hx of CMML, low grade MDS (stable per records). He was hospitalized 6/8-6/15 due to COPD exacerbation, acute hypoxemic respiratory failure, acute on chronic diastolic HF, hypotension, UTI, hx of ETOH abuse. He went home and was later hospitalized again 6/18-6/23 due to chronic respiratory failure, hypotension, atrial flutter, CHF. Palliative Care was asked to help address goals of care. He was discharged to [facility] for rehab. He is being discharged from therapy today due to not progressing per staff. He has worsening weakness, no longer ambulatory, now dependent for adl's. Prior to hospitalizations, he was ambulatory with walker, able to dress himself. PPS went from a 50% to now a 30%. Staff is using sit to stand lift to get out of bed. Worsening dyspnea at rest. He was on room air, now on 2lpm continuously. He wears CPAP at night; he states he slept well last night. His appetite has declined; he is eating 25-50% at best. He now has an unstageable wound to coccyx and left heel. He is unable to tolerate diuresis due to acute kidney injury as well as hypotension. He has 3-4+ edema to lower extremities, abdomen and arms. He is presently on Levaquin 750mg  qd until 02/26/19 due to bilateral pleural effusions. His weights have increased, appears to be fluid related. Recent weights:  180.4 pounds on 01/20/2019 210.4 on 02/12/2019  201.5 on 02/15/2019 201.4 pounds on 02/19/2019     CODE STATUS: DNR   PPS: 30% HOSPICE ELIGIBILITY/DIAGNOSIS: Yes, Heart Failure  PAST MEDICAL HISTORY:  Past Medical History:  Diagnosis Date  . Adenomatous polyp of colon 03/2003  . Anemia   .  Constipation   . Diverticulosis   . Dizziness   . ETOH abuse    binge drinking  . Hemorrhoids   . Hyperlipidemia   . Hypertension   . Macular degeneration    Right eye  . OSA on CPAP   . Type II diabetes mellitus ([place])     SOCIAL HX:  Social History   Tobacco Use  . Smoking status: Never Smoker  . Smokeless tobacco: Never Used  Substance Use Topics  . Alcohol use: Yes    Alcohol/week: 21.0 standard drinks    Types: 14 Standard drinks or equivalent, 7 Shots of liquor per week    Comment: "1 mixed drink/day"    ALLERGIES:  Allergies  Allergen Reactions  . Tape Other (See Comments)    SKIN IS VERY THIN AND WILL TEAR AND BRUISE VERY EASILY!!       PHYSICAL EXAM:   General: NAD, ill appearing  Nathan Slocumb, NP

## 2019-02-23 DIAGNOSIS — R0602 Shortness of breath: Secondary | ICD-10-CM | POA: Diagnosis not present

## 2019-02-23 DIAGNOSIS — J441 Chronic obstructive pulmonary disease with (acute) exacerbation: Secondary | ICD-10-CM | POA: Diagnosis not present

## 2019-03-02 ENCOUNTER — Telehealth: Payer: Self-pay | Admitting: Family Medicine

## 2019-03-02 NOTE — Telephone Encounter (Signed)
Nathan [facility] with [facility]. Calling to see if Dr. [name] will be attending for Hospice services as the patient will transition from [name] the patient is wanting to return home with private care givers.  When he returns home the nursing home doctor can not be the attending with hospice.  Patient is not able to come into the office. The patient is bed bound. Hospice nurses will be involved and is able to communicate with Dr. [name] regarding the patient's care and needs.  After approval, [name] is able to fax over orders.  Thank you  Suzanna [phone]

## 2019-03-02 NOTE — Telephone Encounter (Signed)
TA-Pt is going to be returning home from skilled nursing facility to private caregivers/Hospice needs to know if you will still be the attending while he is bed bound with Hospice nurses involved and will communicate his care and needs to you/plz advise/thx dmf

## 2019-03-03 NOTE — Telephone Encounter (Signed)
Yes of course I would be honored to still be his attending physician.

## 2019-03-05 NOTE — Telephone Encounter (Signed)
LMOVM for Nathan Cunningham with Hospice advising that TA will be his attending and the number she could fax orders to/thx dmf

## 2019-04-10 DEATH — deceased

## 2019-04-24 ENCOUNTER — Telehealth: Payer: Self-pay

## 2019-04-24 NOTE — Telephone Encounter (Signed)
Copied from [place] [phone]. Topic: General - Other >> Apr 23, 2019  2:04 PM Keene Breath wrote: Reason for CRM: Patient's partner called to discuss some things with the nurse or doctor regarding the patient, who is deceased as of 2019-03-18.  Please advise and call Einar Crow back at [phone]

## 2019-04-27 NOTE — Telephone Encounter (Signed)
I returnerd Nathan Cunningham's call.  She was very disappointed with Nathan Cunningham's treatment at Nathan Cunningham's place.  She was finally able to bring him home on hospice and got him [facility]'s Hand full time care healthcare.  Spoke at length with his wife, Nathan Cunningham.

## 2019-07-30 ENCOUNTER — Ambulatory Visit: Payer: Medicare Other | Admitting: Internal Medicine

## 2019-07-30 ENCOUNTER — Other Ambulatory Visit: Payer: Medicare Other
